# Patient Record
Sex: Female | Born: 1960 | Race: White | Hispanic: No | Smoking: Former smoker
Health system: Southern US, Community
[De-identification: ages and names within clinical notes are randomized; demographics above are authoritative.]

## PROBLEM LIST (undated history)

## (undated) DIAGNOSIS — F419 Anxiety disorder, unspecified: Secondary | ICD-10-CM

## (undated) DIAGNOSIS — M797 Fibromyalgia: Secondary | ICD-10-CM

## (undated) DIAGNOSIS — I10 Essential (primary) hypertension: Secondary | ICD-10-CM

## (undated) DIAGNOSIS — R519 Headache, unspecified: Secondary | ICD-10-CM

## (undated) DIAGNOSIS — C801 Malignant (primary) neoplasm, unspecified: Secondary | ICD-10-CM

## (undated) DIAGNOSIS — F32A Depression, unspecified: Secondary | ICD-10-CM

## (undated) DIAGNOSIS — E079 Disorder of thyroid, unspecified: Secondary | ICD-10-CM

## (undated) DIAGNOSIS — G4730 Sleep apnea, unspecified: Secondary | ICD-10-CM

## (undated) DIAGNOSIS — E039 Hypothyroidism, unspecified: Secondary | ICD-10-CM

## (undated) DIAGNOSIS — K219 Gastro-esophageal reflux disease without esophagitis: Secondary | ICD-10-CM

## (undated) DIAGNOSIS — Z87442 Personal history of urinary calculi: Secondary | ICD-10-CM

## (undated) DIAGNOSIS — F329 Major depressive disorder, single episode, unspecified: Secondary | ICD-10-CM

## (undated) DIAGNOSIS — M1990 Unspecified osteoarthritis, unspecified site: Secondary | ICD-10-CM

## (undated) DIAGNOSIS — R7303 Prediabetes: Secondary | ICD-10-CM

## (undated) HISTORY — PX: UMBILICAL HERNIA REPAIR: SHX196

## (undated) HISTORY — PX: MASTECTOMY: SHX3

## (undated) HISTORY — PX: IMAGE GUIDED SINUS SURGERY: SHX6570

## (undated) HISTORY — DX: Depression, unspecified: F32.A

## (undated) HISTORY — PX: FOOT BONE EXCISION: SUR493

## (undated) HISTORY — DX: Major depressive disorder, single episode, unspecified: F32.9

## (undated) HISTORY — PX: BREAST BIOPSY: SHX20

## (undated) HISTORY — PX: EYE SURGERY: SHX253

## (undated) HISTORY — PX: APPENDECTOMY: SHX54

---

## 2014-01-04 DIAGNOSIS — M069 Rheumatoid arthritis, unspecified: Secondary | ICD-10-CM | POA: Insufficient documentation

## 2014-01-04 DIAGNOSIS — F411 Generalized anxiety disorder: Secondary | ICD-10-CM | POA: Insufficient documentation

## 2014-01-04 DIAGNOSIS — E039 Hypothyroidism, unspecified: Secondary | ICD-10-CM | POA: Insufficient documentation

## 2014-01-04 DIAGNOSIS — E559 Vitamin D deficiency, unspecified: Secondary | ICD-10-CM | POA: Insufficient documentation

## 2014-02-04 DIAGNOSIS — I10 Essential (primary) hypertension: Secondary | ICD-10-CM | POA: Insufficient documentation

## 2014-04-16 DIAGNOSIS — E785 Hyperlipidemia, unspecified: Secondary | ICD-10-CM | POA: Insufficient documentation

## 2014-06-07 ENCOUNTER — Encounter: Payer: Self-pay | Admitting: Emergency Medicine

## 2014-06-07 ENCOUNTER — Emergency Department
Admission: EM | Admit: 2014-06-07 | Discharge: 2014-06-07 | Disposition: A | Discharge status: Home / Self Care | Payer: Medicare HMO | Attending: Emergency Medicine | Admitting: Emergency Medicine

## 2014-06-07 DIAGNOSIS — Z008 Encounter for other general examination: Secondary | ICD-10-CM | POA: Diagnosis present

## 2014-06-07 DIAGNOSIS — Z0000 Encounter for general adult medical examination without abnormal findings: Secondary | ICD-10-CM | POA: Diagnosis not present

## 2014-06-07 DIAGNOSIS — Z720 Tobacco use: Secondary | ICD-10-CM | POA: Insufficient documentation

## 2014-06-07 DIAGNOSIS — I10 Essential (primary) hypertension: Secondary | ICD-10-CM | POA: Diagnosis not present

## 2014-06-07 HISTORY — DX: Unspecified osteoarthritis, unspecified site: M19.90

## 2014-06-07 HISTORY — DX: Essential (primary) hypertension: I10

## 2014-06-07 HISTORY — DX: Fibromyalgia: M79.7

## 2014-06-07 HISTORY — DX: Disorder of thyroid, unspecified: E07.9
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
## 2014-06-07 NOTE — Discharge Instructions (Signed)
YOU WILL NEED TO SEE YOUR DOCTOR FOR CLEARANCE

## 2014-06-07 NOTE — ED Notes (Signed)
Pt states she is trying to give plasma and mentioned to them that she had Raynauds disease and they advised her she could not give plasma until she was seen by a Dr., states she unable to see her PCP today and she can not afford the co-payment at the urgent care, states she needs to be seen today because she depends on the plasma donation to pay her bills, denies any symptoms today

## 2014-10-25 ENCOUNTER — Other Ambulatory Visit: Payer: Self-pay | Admitting: Family Medicine

## 2014-10-25 DIAGNOSIS — Z1231 Encounter for screening mammogram for malignant neoplasm of breast: Secondary | ICD-10-CM

## 2015-11-04 DIAGNOSIS — F17200 Nicotine dependence, unspecified, uncomplicated: Secondary | ICD-10-CM | POA: Insufficient documentation

## 2015-11-04 DIAGNOSIS — M2550 Pain in unspecified joint: Secondary | ICD-10-CM | POA: Insufficient documentation

## 2016-03-15 ENCOUNTER — Encounter: Payer: Self-pay | Admitting: Emergency Medicine

## 2016-03-15 DIAGNOSIS — R1031 Right lower quadrant pain: Secondary | ICD-10-CM | POA: Diagnosis present

## 2016-03-15 DIAGNOSIS — N200 Calculus of kidney: Secondary | ICD-10-CM | POA: Insufficient documentation

## 2016-03-15 DIAGNOSIS — F17200 Nicotine dependence, unspecified, uncomplicated: Secondary | ICD-10-CM | POA: Insufficient documentation

## 2016-03-15 DIAGNOSIS — I10 Essential (primary) hypertension: Secondary | ICD-10-CM | POA: Insufficient documentation

## 2016-03-15 LAB — URINALYSIS, COMPLETE (UACMP) WITH MICROSCOPIC
Bacteria, UA: NONE SEEN
Bilirubin Urine: NEGATIVE
Glucose, UA: NEGATIVE mg/dL
Ketones, ur: NEGATIVE mg/dL
Leukocytes, UA: NEGATIVE
Nitrite: NEGATIVE
Protein, ur: 30 mg/dL — AB
Specific Gravity, Urine: 1.004 — ABNORMAL LOW (ref 1.005–1.030)
pH: 7 (ref 5.0–8.0)

## 2016-03-15 LAB — COMPREHENSIVE METABOLIC PANEL
ALT: 23 U/L (ref 14–54)
AST: 26 U/L (ref 15–41)
Albumin: 4 g/dL (ref 3.5–5.0)
Alkaline Phosphatase: 72 U/L (ref 38–126)
Anion gap: 6 (ref 5–15)
BUN: 13 mg/dL (ref 6–20)
CALCIUM: 9.5 mg/dL (ref 8.9–10.3)
CO2: 29 mmol/L (ref 22–32)
Chloride: 105 mmol/L (ref 101–111)
Creatinine, Ser: 0.8 mg/dL (ref 0.44–1.00)
GFR calc non Af Amer: >60 mL/min (ref >60)
Glucose, Bld: 119 mg/dL — ABNORMAL HIGH (ref 65–99)
Potassium: 4.4 mmol/L (ref 3.5–5.1)
Sodium: 140 mmol/L (ref 135–145)
Total Bilirubin: 0.2 mg/dL — ABNORMAL LOW (ref 0.3–1.2)
Total Protein: 7.6 g/dL (ref 6.5–8.1)

## 2016-03-15 LAB — CBC
HCT: 41.9 % (ref 35.0–47.0)
Hemoglobin: 14.1 g/dL (ref 12.0–16.0)
MCH: 29.4 pg (ref 26.0–34.0)
MCHC: 33.6 g/dL (ref 32.0–36.0)
MCV: 87.4 fL (ref 80.0–100.0)
Platelets: 312 10*3/uL (ref 150–440)
RBC: 4.8 MIL/uL (ref 3.80–5.20)
RDW: 13 % (ref 11.5–14.5)
WBC: 11.3 10*3/uL — ABNORMAL HIGH (ref 3.6–11.0)

## 2016-03-15 LAB — LIPASE, BLOOD: Lipase: 39 U/L (ref 11–51)

## 2016-03-15 NOTE — ED Triage Notes (Signed)
Pt has hx of constipation but had large bm Tuesday, states now having generalized abd pain and right sided back pain.  Pt has nausea but no vomiting, has had chills, states does have some dysuria.

## 2016-03-16 ENCOUNTER — Emergency Department
Admission: EM | Admit: 2016-03-16 | Discharge: 2016-03-16 | Disposition: A | Discharge status: Home / Self Care | Payer: Medicare HMO | Attending: Emergency Medicine | Admitting: Emergency Medicine

## 2016-03-16 ENCOUNTER — Emergency Department: Payer: Medicare HMO

## 2016-03-16 DIAGNOSIS — N200 Calculus of kidney: Secondary | ICD-10-CM

## 2016-03-16 MED ORDER — CEPHALEXIN 500 MG PO CAPS: 500.0000 mg | ORAL_CAPSULE | Freq: Two times a day (BID) | ORAL | 0 refills | Status: AC

## 2016-03-16 MED ORDER — SODIUM CHLORIDE 0.9 % IV BOLUS (SEPSIS)
1000.0000 mL | Freq: Once | INTRAVENOUS | Status: AC
  Administered 2016-03-16: 1000 mL via INTRAVENOUS

## 2016-03-16 MED ORDER — KETOROLAC TROMETHAMINE 30 MG/ML IJ SOLN
30.0000 mg | Freq: Once | INTRAMUSCULAR | Status: AC
  Administered 2016-03-16: 30 mg via INTRAVENOUS
  Filled 2016-03-16: qty 1

## 2016-03-16 MED ORDER — METOPROLOL TARTRATE 50 MG PO TABS: 50.0000 mg | ORAL_TABLET | Freq: Two times a day (BID) | ORAL | 0 refills | Status: AC

## 2016-03-16 MED ORDER — LISINOPRIL 20 MG PO TABS: 20.0000 mg | ORAL_TABLET | Freq: Every day | ORAL | 0 refills | Status: DC

## 2016-03-16 MED ORDER — ONDANSETRON HCL 4 MG/2ML IJ SOLN
4.0000 mg | Freq: Once | INTRAMUSCULAR | Status: AC
  Administered 2016-03-16: 4 mg via INTRAVENOUS
  Filled 2016-03-16: qty 2

## 2016-03-16 NOTE — ED Provider Notes (Signed)
[facility] Emergency Department Provider Note    First MD Initiated Contact with Patient 03/16/16 0143     (approximate)  I have reviewed the triage vital signs and the nursing notes.   HISTORY  Chief Complaint Constipation   HPI Kelli Logan is a 56 y.o. female with below list of chronic medical conditions including kidney stones presents to the emergency department with acute onset of right flank pain radiating to the right lower quadrant accompanied by nausea onset today. Patient states her current pain score 7 out of 10. Patient denies any diarrhea no fever or vomiting. Patient is also requesting a refill of her antihypertensive medications. Patient denies any hematuria no dysuria or frequency or urgency.   Past Medical History:  Diagnosis Date  . Arthritis   . Fibromyalgia   . Hypertension   . Thyroid disease     There are no active problems to display for this patient.   Past surgical history None  Prior to Admission medications   Not on File    Allergies No known drug allergies  No family history on file.  Social History Social History  Substance Use Topics  . Smoking status: Current Some Day Smoker  . Smokeless tobacco: Not on file  . Alcohol use Yes    Review of Systems Constitutional: No fever/chills Eyes: No visual changes. ENT: No sore throat. Cardiovascular: Denies chest pain. Respiratory: Denies shortness of breath. Gastrointestinal: Positive for right flank/right lower quadrant pain.  No nausea, no vomiting.  No diarrhea.  No constipation. Genitourinary: Negative for dysuria. Musculoskeletal: Negative for back pain. Skin: Negative for rash. Neurological: Negative for headaches, focal weakness or numbness.  10-point ROS otherwise negative.  ____________________________________________   PHYSICAL EXAM:  VITAL SIGNS: ED Triage Vitals [03/15/16 2243]  Enc Vitals Group     BP (!) 176/100     Pulse Rate 83      Resp 18     Temp 97.7 F (36.5 C)     Temp Source Oral     SpO2 99 %     Weight 170 lb (77.1 kg)     Height 5\' 6"  (1.676 m)     Head Circumference      Peak Flow      Pain Score 8     Pain Loc      Pain Edu?      Excl. in [place]?     Constitutional: Alert and oriented. Well appearing and in no acute distress. Eyes: Conjunctivae are normal. PERRL. EOMI. Head: Atraumatic. Mouth/Throat: Mucous membranes are moist.  Oropharynx non-erythematous. Neck: No stridor.   Cardiovascular: Normal rate, regular rhythm. Good peripheral circulation. Grossly normal heart sounds. Respiratory: Normal respiratory effort.  No retractions. Lungs CTAB. Gastrointestinal: Soft and nontender. No distention.  Musculoskeletal: No lower extremity tenderness nor edema. No gross deformities of extremities. Neurologic:  Normal speech and language. No gross focal neurologic deficits are appreciated.  Skin:  Skin is warm, dry and intact. No rash noted. Psychiatric: Mood and affect are normal. Speech and behavior are normal.  ____________________________________________   LABS (all labs ordered are listed, but only abnormal results are displayed)  Labs Reviewed  CBC - Abnormal; Notable for the following:       Result Value   WBC 11.3 (*)    All other components within normal limits  COMPREHENSIVE METABOLIC PANEL - Abnormal; Notable for the following:    Glucose, Bld 119 (*)    Total Bilirubin 0.2 (*)  All other components within normal limits  URINALYSIS, COMPLETE (UACMP) WITH MICROSCOPIC - Abnormal; Notable for the following:    Color, Urine STRAW (*)    APPearance CLEAR (*)    Specific Gravity, Urine 1.004 (*)    Hgb urine dipstick MODERATE (*)    Protein, ur 30 (*)    Squamous Epithelial / LPF 0-5 (*)    All other components within normal limits  LIPASE, BLOOD   CLINICAL DATA:  Initial evaluation for acute right flank pain.  EXAM: CT ABDOMEN AND PELVIS WITHOUT  CONTRAST  TECHNIQUE: Multidetector CT imaging of the abdomen and pelvis was performed following the standard protocol without IV contrast.  COMPARISON:  None available.  FINDINGS: Lower chest: Mild bibasilar atelectatic changes noted. Visualized lungs are otherwise clear.  Hepatobiliary: Subcentimeter 6 mm hypodensity within the left hepatic lobe noted, indeterminate, but may reflect a small cyst. Additional 13 mm vague hypodensity within the posterior right hepatic lobe noted (series 2, image 14), indeterminate. Liver otherwise unremarkable. Gallbladder appears to be contracted. No biliary dilatation.  Pancreas: Pancreas within normal limits.  Spleen: Spleen within normal limits.  Adrenals/Urinary Tract: Adrenal glands are normal.  3.7 cm cyst present within the interpolar left kidney. Punctate 2 mm nonobstructive stone within the lower pole left kidney. No radiopaque calculi seen along the course of the left renal collect system. No left-sided hydronephrosis or hydroureter.  On the right, there is mild right hydroureteronephrosis. Prominent inflammatory stranding about the mildly dilated right ureter and right renal collecting system. No definite obstructive radiopaque stone identified. Finding raises the possibility for a recently passed stone. Possible infection could also be considered. Few scattered subcentimeter hypodensities noted within the interpolar right kidney, poorly characterized on this noncontrast exam, but statistically likely small cysts.  Bladder partially distended. No layering stones present within the bladder lumen.  Stomach/Bowel: Small hiatal hernia. Stomach otherwise unremarkable. No evidence for bowel obstruction. No acute inflammatory changes seen about the bowels.  Vascular/Lymphatic: Intra- abdominal aorta of normal caliber. Minimal atheromatous plaque within the infrarenal aorta. No pathologically enlarged intra-abdominal or  pelvic lymph nodes. Minimal hazy stranding about the infrarenal aorta just above the bifurcation related to the adjacent inflamed right ureter.  Reproductive: Uterus and ovaries within normal limits.  Other: No free air or fluid.  Musculoskeletal: No acute osseous abnormality. No worrisome lytic or blastic osseous lesions. Scatter multilevel facet arthropathy noted within the visualized spine.  IMPRESSION: 1. Mild right hydroureteronephrosis with associated periureteral fat stranding. No obstructive radiopaque calculus identified. Finding raises the possibility for a recently passed stone. Possible infection could also conceivably be considered. Correlation with urinalysis recommended. 2. Punctate 2 mm nonobstructive left renal nephrolithiasis. 3. Small hiatal hernia.   Electronically Signed   By: Jeannine Boga M.D.   On: 03/16/2016 02:42   Procedures    INITIAL IMPRESSION / ASSESSMENT AND PLAN / ED COURSE  Pertinent labs & imaging results that were available during my care of the patient were reviewed by me and considered in my medical decision making (see chart for details).  Patient given Toradol 30 mg IV and Zofran 4 mg as well as 1 L IV normal saline.      ____________________________________________  FINAL CLINICAL IMPRESSION(S) / ED DIAGNOSES  Final diagnoses:  Right kidney stone     MEDICATIONS GIVEN DURING THIS VISIT:  Medications - No data to display   NEW OUTPATIENT MEDICATIONS STARTED DURING THIS VISIT:  New Prescriptions   No medications on file  Modified Medications   No medications on file    Discontinued Medications   No medications on file     Note:  This document was prepared using Dragon voice recognition software and may include unintentional dictation errors.    Gregor Hams, MD 03/16/16 [id number]

## 2017-07-12 DIAGNOSIS — K219 Gastro-esophageal reflux disease without esophagitis: Secondary | ICD-10-CM | POA: Insufficient documentation

## 2017-09-03 ENCOUNTER — Other Ambulatory Visit: Payer: Self-pay | Admitting: Registered Nurse

## 2017-09-03 DIAGNOSIS — Z1231 Encounter for screening mammogram for malignant neoplasm of breast: Secondary | ICD-10-CM

## 2017-11-18 ENCOUNTER — Ambulatory Visit
Admission: RE | Admit: 2017-11-18 | Discharge: 2017-11-18 | Disposition: A | Discharge status: Home / Self Care | Payer: Medicare HMO | Source: Ambulatory Visit | Attending: Registered Nurse | Admitting: Registered Nurse

## 2017-11-18 DIAGNOSIS — Z1231 Encounter for screening mammogram for malignant neoplasm of breast: Secondary | ICD-10-CM | POA: Diagnosis present

## 2017-12-04 DIAGNOSIS — R928 Other abnormal and inconclusive findings on diagnostic imaging of breast: Secondary | ICD-10-CM | POA: Insufficient documentation

## 2017-12-09 ENCOUNTER — Other Ambulatory Visit: Payer: Self-pay | Admitting: Registered Nurse

## 2017-12-09 DIAGNOSIS — R928 Other abnormal and inconclusive findings on diagnostic imaging of breast: Secondary | ICD-10-CM

## 2017-12-09 DIAGNOSIS — R921 Mammographic calcification found on diagnostic imaging of breast: Secondary | ICD-10-CM

## 2017-12-10 ENCOUNTER — Ambulatory Visit: Payer: Medicare HMO

## 2017-12-13 ENCOUNTER — Ambulatory Visit
Admission: RE | Admit: 2017-12-13 | Discharge: 2017-12-13 | Disposition: A | Discharge status: Home / Self Care | Payer: Medicare HMO | Source: Ambulatory Visit | Attending: Registered Nurse | Admitting: Registered Nurse

## 2017-12-13 DIAGNOSIS — R928 Other abnormal and inconclusive findings on diagnostic imaging of breast: Secondary | ICD-10-CM | POA: Diagnosis not present

## 2017-12-13 DIAGNOSIS — R921 Mammographic calcification found on diagnostic imaging of breast: Secondary | ICD-10-CM | POA: Diagnosis present

## 2017-12-18 ENCOUNTER — Emergency Department
Admission: EM | Admit: 2017-12-18 | Discharge: 2017-12-18 | Disposition: A | Discharge status: Home / Self Care | Payer: Medicare HMO | Attending: Emergency Medicine | Admitting: Emergency Medicine

## 2017-12-18 ENCOUNTER — Other Ambulatory Visit: Payer: Self-pay | Admitting: Registered Nurse

## 2017-12-18 ENCOUNTER — Other Ambulatory Visit: Payer: Self-pay

## 2017-12-18 ENCOUNTER — Emergency Department: Payer: Medicare HMO

## 2017-12-18 DIAGNOSIS — F17200 Nicotine dependence, unspecified, uncomplicated: Secondary | ICD-10-CM | POA: Insufficient documentation

## 2017-12-18 DIAGNOSIS — Y929 Unspecified place or not applicable: Secondary | ICD-10-CM | POA: Diagnosis not present

## 2017-12-18 DIAGNOSIS — R921 Mammographic calcification found on diagnostic imaging of breast: Secondary | ICD-10-CM

## 2017-12-18 DIAGNOSIS — Y999 Unspecified external cause status: Secondary | ICD-10-CM | POA: Insufficient documentation

## 2017-12-18 DIAGNOSIS — Z79899 Other long term (current) drug therapy: Secondary | ICD-10-CM | POA: Diagnosis not present

## 2017-12-18 DIAGNOSIS — Y939 Activity, unspecified: Secondary | ICD-10-CM | POA: Diagnosis not present

## 2017-12-18 DIAGNOSIS — M545 Low back pain: Secondary | ICD-10-CM | POA: Diagnosis not present

## 2017-12-18 DIAGNOSIS — R928 Other abnormal and inconclusive findings on diagnostic imaging of breast: Secondary | ICD-10-CM

## 2017-12-18 DIAGNOSIS — I10 Essential (primary) hypertension: Secondary | ICD-10-CM | POA: Insufficient documentation

## 2017-12-18 DIAGNOSIS — S8012XA Contusion of left lower leg, initial encounter: Secondary | ICD-10-CM | POA: Insufficient documentation

## 2017-12-18 DIAGNOSIS — S8992XA Unspecified injury of left lower leg, initial encounter: Secondary | ICD-10-CM | POA: Diagnosis present

## 2017-12-18 HISTORY — DX: Anxiety disorder, unspecified: F41.9

## 2017-12-18 LAB — CBC WITH DIFFERENTIAL/PLATELET
Abs Immature Granulocytes: 0.01 10*3/uL (ref 0.00–0.07)
BASOS PCT: 1 %
Basophils Absolute: 0.1 10*3/uL (ref 0.0–0.1)
EOS ABS: 0.3 10*3/uL (ref 0.0–0.5)
Eosinophils Relative: 5 %
HEMATOCRIT: 39.1 % (ref 36.0–46.0)
Hemoglobin: 13.2 g/dL (ref 12.0–15.0)
IMMATURE GRANULOCYTES: 0 %
Lymphocytes Relative: 32 %
Lymphs Abs: 1.8 10*3/uL (ref 0.7–4.0)
MCH: 28.6 pg (ref 26.0–34.0)
MCHC: 33.8 g/dL (ref 30.0–36.0)
MCV: 84.6 fL (ref 80.0–100.0)
MONO ABS: 0.6 10*3/uL (ref 0.1–1.0)
Monocytes Relative: 11 %
NRBC: 0 % (ref 0.0–0.2)
Neutro Abs: 2.9 10*3/uL (ref 1.7–7.7)
Neutrophils Relative %: 51 %
PLATELETS: 330 10*3/uL (ref 150–400)
RBC: 4.62 MIL/uL (ref 3.87–5.11)
RDW: 12.5 % (ref 11.5–15.5)
WBC: 5.7 10*3/uL (ref 4.0–10.5)

## 2017-12-18 LAB — COMPREHENSIVE METABOLIC PANEL
ALBUMIN: 4 g/dL (ref 3.5–5.0)
ALT: 30 U/L (ref 0–44)
AST: 30 U/L (ref 15–41)
Alkaline Phosphatase: 73 U/L (ref 38–126)
Anion gap: 8 (ref 5–15)
BUN: 12 mg/dL (ref 6–20)
CHLORIDE: 102 mmol/L (ref 98–111)
CO2: 24 mmol/L (ref 22–32)
Calcium: 9.1 mg/dL (ref 8.9–10.3)
Creatinine, Ser: 0.79 mg/dL (ref 0.44–1.00)
GFR calc Af Amer: >60 mL/min (ref >60)
GFR calc non Af Amer: >60 mL/min (ref >60)
Glucose, Bld: 97 mg/dL (ref 70–99)
POTASSIUM: 4.1 mmol/L (ref 3.5–5.1)
SODIUM: 134 mmol/L — AB (ref 135–145)
Total Bilirubin: 0.9 mg/dL (ref 0.3–1.2)
Total Protein: 7 g/dL (ref 6.5–8.1)

## 2017-12-18 MED ORDER — HYDROCODONE-ACETAMINOPHEN 5-325 MG PO TABS: 1.0000 | ORAL_TABLET | Freq: Four times a day (QID) | ORAL | 0 refills | Status: DC | PRN

## 2017-12-18 MED ORDER — IBUPROFEN 800 MG PO TABS: 800.0000 mg | ORAL_TABLET | Freq: Three times a day (TID) | ORAL | 0 refills | Status: DC | PRN

## 2017-12-18 MED ORDER — MORPHINE SULFATE (PF) 4 MG/ML IV SOLN
4.0000 mg | Freq: Once | INTRAVENOUS | Status: AC
  Administered 2017-12-18: 4 mg via INTRAVENOUS
  Filled 2017-12-18: qty 1

## 2017-12-18 NOTE — ED Notes (Signed)
Ace wrap placed on pt right lower leg. Pt supplied with crutches and given instructions for use. Pt able to ambulate around the room without difficulties
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
## 2017-12-18 NOTE — ED Notes (Signed)
Pt taken to xray 
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
## 2017-12-26 ENCOUNTER — Ambulatory Visit
Admission: RE | Admit: 2017-12-26 | Discharge: 2017-12-26 | Disposition: A | Discharge status: Home / Self Care | Payer: Medicare HMO | Source: Ambulatory Visit | Attending: Registered Nurse | Admitting: Registered Nurse

## 2017-12-26 DIAGNOSIS — R928 Other abnormal and inconclusive findings on diagnostic imaging of breast: Secondary | ICD-10-CM | POA: Diagnosis present

## 2017-12-26 DIAGNOSIS — R921 Mammographic calcification found on diagnostic imaging of breast: Secondary | ICD-10-CM | POA: Insufficient documentation

## 2017-12-26 HISTORY — PX: BREAST BIOPSY: SHX20

## 2017-12-27 ENCOUNTER — Other Ambulatory Visit: Payer: Self-pay | Admitting: Anatomic Pathology & Clinical Pathology

## 2017-12-30 ENCOUNTER — Other Ambulatory Visit: Payer: Self-pay | Admitting: *Deleted

## 2017-12-30 DIAGNOSIS — C50912 Malignant neoplasm of unspecified site of left female breast: Secondary | ICD-10-CM

## 2018-01-02 LAB — SURGICAL PATHOLOGY

## 2018-01-06 ENCOUNTER — Ambulatory Visit (INDEPENDENT_AMBULATORY_CARE_PROVIDER_SITE_OTHER): Payer: Medicare HMO | Admitting: General Surgery

## 2018-01-06 ENCOUNTER — Encounter: Payer: Self-pay | Admitting: Internal Medicine

## 2018-01-06 ENCOUNTER — Encounter: Payer: Self-pay | Admitting: General Surgery

## 2018-01-06 ENCOUNTER — Inpatient Hospital Stay: Payer: Medicare HMO | Attending: Internal Medicine | Admitting: Internal Medicine

## 2018-01-06 ENCOUNTER — Other Ambulatory Visit: Payer: Self-pay

## 2018-01-06 ENCOUNTER — Encounter: Payer: Self-pay | Admitting: *Deleted

## 2018-01-06 VITALS — BP 154/103 | HR 87 | Temp 97.7°F | Resp 18 | Ht 66.0 in | Wt 190.2 lb

## 2018-01-06 DIAGNOSIS — Z87891 Personal history of nicotine dependence: Secondary | ICD-10-CM | POA: Insufficient documentation

## 2018-01-06 DIAGNOSIS — R921 Mammographic calcification found on diagnostic imaging of breast: Secondary | ICD-10-CM

## 2018-01-06 DIAGNOSIS — Z79899 Other long term (current) drug therapy: Secondary | ICD-10-CM

## 2018-01-06 DIAGNOSIS — Z170 Estrogen receptor positive status [ER+]: Secondary | ICD-10-CM | POA: Diagnosis not present

## 2018-01-06 DIAGNOSIS — C50512 Malignant neoplasm of lower-outer quadrant of left female breast: Secondary | ICD-10-CM

## 2018-01-06 DIAGNOSIS — I10 Essential (primary) hypertension: Secondary | ICD-10-CM | POA: Diagnosis not present
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
## 2018-01-06 NOTE — Progress Notes (Signed)
Patient needs an additional biopsy of additional left breast calcifications on mammo after biopsy.   Also, needs a left axillary ultrasound per Dr. Celine Ahr.   Orders in [place].   Note to Sam in mammography at [facility].

## 2018-01-06 NOTE — Progress Notes (Signed)
Patient ID: Kelli Logan, female   DOB: 1960/01/11, 57 y.o.   MRN: [id number]  Chief Complaint  Patient presents with  . New Patient (Initial Visit)    Left breast cancer    HPI Kelli Logan is a 57 y.o. female.   She has been referred for surgical evaluation of her left breast cancer.  Routine screening mammogram detected suspicious calcifications and subsequent diagnostic mammography confirmed this finding in the left breast.  She underwent stereotactic core biopsy with the final pathology showing invasive ductal carcinoma, high-grade, comedo necrosis present, ER positive, PR negative, HER-2 negative.  He has just come from her appointment with medical oncology, however their note is not yet complete.  Notably, the post biopsy mammogram demonstrated an additional area of calcifications, for which additional biopsy was recommended, if breast conservation is desired.  Her menarche was at 54.  She had one pregnancy but did not breast-feed the child.  She underwent menopause at the age of 67.  She had exposure to oral contraceptive pills for 20+ years.  She did not have any exposure to hormone replacement therapy.  She has no family history of breast cancer to her knowledge.  She denies performing monthly self exams.  Her last mammogram, prior to the most recent, was performed in 2014 in [place].  There was a biopsy performed which was consistent with a benign process.  She is understandably tearful today.  She has a number of other issues that are concerning to her.  These include a recent accident with injury to her left lower extremity.  She also had a dental crown or filling knocked loose and she has dental pain as a result of this.  She expressed a strong desire for breast conservation if possible.   Past Medical History:  Diagnosis Date  . Anxiety   . Arthritis   . Fibromyalgia   . Hypertension   . Thyroid disease     Past Surgical History:  Procedure Laterality Date  .  APPENDECTOMY    . BREAST BIOPSY Left few yrs ago   stereotactic bx in [place], benign  . BREAST BIOPSY Left 12/26/2017   Affirm Bx-Path pending- X-Clip  . FOOT BONE EXCISION    . UMBILICAL HERNIA REPAIR  1983 and 1985    Family History  Problem Relation Age of Onset  . Hypertension Mother   . Lung cancer Maternal Grandmother   . Heart disease Father     Social History Social History   Tobacco Use  . Smoking status: Former Smoker    Last attempt to quit: 01/06/2017    Years since quitting: 1.0  . Smokeless tobacco: Never Used  Substance Use Topics  . Alcohol use: Yes  . Drug use: Not on file    No Known Allergies  Current Outpatient Medications  Medication Sig Dispense Refill  . hydrochlorothiazide (HYDRODIURIL) 25 MG tablet     . HYDROcodone-acetaminophen (NORCO/VICODIN) 5-325 MG tablet Take 1 tablet by mouth every 6 (six) hours as needed for moderate pain. 10 tablet 0  . ibuprofen (ADVIL,MOTRIN) 800 MG tablet Take 1 tablet (800 mg total) by mouth every 8 (eight) hours as needed. 30 tablet 0  . levothyroxine (SYNTHROID, LEVOTHROID) 50 MCG tablet     . lisinopril (PRINIVIL,ZESTRIL) 20 MG tablet Take 20 mg by mouth daily.    . metoprolol (LOPRESSOR) 50 MG tablet Take 1 tablet (50 mg total) by mouth 2 (two) times daily. 60 tablet 0  . omeprazole (PRILOSEC) 20 MG  capsule     . lisinopril (PRINIVIL,ZESTRIL) 20 MG tablet Take 1 tablet (20 mg total) by mouth daily. 30 tablet 0   No current facility-administered medications for this visit.     Review of Systems Review of Systems  Constitutional: Positive for fatigue.  HENT: Positive for congestion, dental problem, sinus pressure and sinus pain.   Eyes: Negative.   Respiratory: Negative.   Cardiovascular: Positive for palpitations and leg swelling.  Gastrointestinal:       GERD  Endocrine:       Hypothyroid   Genitourinary:       Nephrolithiasis  Musculoskeletal: Positive for back pain.  Skin:       Bruising on LLE from  trauma  Neurological: Negative.   Hematological: Negative.   Psychiatric/Behavioral: The patient is nervous/anxious.   All other systems reviewed and are negative.   Blood pressure (!) 154/103, pulse 87, temperature 97.7 F (36.5 C), temperature source Temporal, resp. rate 18, height _0  (1.676 m), weight 190 lb 3.2 oz (86.3 kg), SpO2 97 %.  Physical Exam Physical Exam Vitals signs reviewed. Exam conducted with a chaperone present.  Constitutional:      General: She is not in acute distress.    Appearance: She is obese.     Comments: Tearful, anxious  HENT:     Head: Normocephalic and atraumatic.     Nose: Congestion present.     Mouth/Throat:     Mouth: Mucous membranes are moist.     Pharynx: No oropharyngeal exudate.     Comments: Molar with broken off crown or filling (right maxillary) Eyes:     General: No scleral icterus.    Conjunctiva/sclera: Conjunctivae normal.     Pupils: Pupils are equal, round, and reactive to light.  Neck:     Musculoskeletal: Normal range of motion and neck supple.  Cardiovascular:     Rate and Rhythm: Normal rate and regular rhythm.     Pulses: Normal pulses.  Pulmonary:     Effort: Pulmonary effort is normal.     Breath sounds: Normal breath sounds.  Chest:     Breasts: Breasts are symmetrical.        Right: Tenderness present. No swelling, bleeding, inverted nipple, mass, nipple discharge or skin change.        Left: No swelling, bleeding, inverted nipple, mass, nipple discharge, skin change or tenderness.       Comments: Right breast tender in periareolar area.  Dense breast tissue.  No masses palpable in either breast. Biopsy site on left breast without hematoma or other skin changes.   Abdominal:     General: Abdomen is flat. Bowel sounds are normal.     Palpations: Abdomen is soft. There is no mass.  Musculoskeletal:     Comments: LLE with bruising anterior tibia.  Tender.  Lymphadenopathy:     Cervical: No cervical adenopathy.      Upper Body:     Right upper body: No supraclavicular, axillary or pectoral adenopathy.     Left upper body: No supraclavicular, axillary or pectoral adenopathy.  Skin:    General: Skin is warm and dry.  Neurological:     General: No focal deficit present.     Mental Status: She is alert.  Psychiatric:        Mood and Affect: Mood normal.     Data Reviewed Pathology: Surgical Pathology  * THIS IS AN ADDENDUM REPORT *  CASE: [id number]  PATIENT: [facility]  Surgical  Pathology Report  **Addendum **  Reason for Addendum #1: Breast Biomarker Results   SPECIMEN SUBMITTED:  A. Breast, left   CLINICAL HISTORY:  Screening detected left breast calcifications   PRE-OPERATIVE DIAGNOSIS:  FCC, fibroadenoma r/o malignancy   POST-OPERATIVE DIAGNOSIS:  None provided.      DIAGNOSIS:  A. BREAST, LEFT LOWER OUTER QUADRANT; STEREOTACTIC BIOPSY:  - INVASIVE MAMMARY CARCINOMA, NO SPECIAL TYPE.  - HIGH GRADE DUCTAL CARCINOMA IN SITU, WITH COMEDONECROSIS, PRESENT IN 5  OF 5 TISSUE BLOCKS.  - CALCIFICATIONS PRESENT, ASSOCIATED WITH NEOPLASTIC ELEMENTS.   Size of invasive carcinoma: 2 mm in this sample  Histologic grade of invasive carcinoma: Grade 3            Glandular/tubular differentiation score: 3            Nuclear pleomorphism score: 3            Mitotic rate score: 2            Total score: 8  Ductal carcinoma in situ: Present, high-grade with comedonecrosis  Lymphovascular invasion: Not identified   Surgical Pathology  * THIS IS AN ADDENDUM REPORT *  CASE: [id number]  PATIENT: [place]  Surgical Pathology Report  *Addendum *   Reason for Addendum #1: Breast Biomarker Results   SPECIMEN SUBMITTED:  A. Breast, left   CLINICAL HISTORY:  Screening detected left breast calcifications   PRE-OPERATIVE DIAGNOSIS:  FCC, fibroadenoma r/o malignancy   POST-OPERATIVE DIAGNOSIS:  None provided.       DIAGNOSIS:  A. BREAST, LEFT LOWER OUTER QUADRANT; STEREOTACTIC BIOPSY:  - INVASIVE MAMMARY CARCINOMA, NO SPECIAL TYPE.  - HIGH GRADE DUCTAL CARCINOMA IN SITU, WITH COMEDONECROSIS, PRESENT IN 5  OF 5 TISSUE BLOCKS.  - CALCIFICATIONS PRESENT, ASSOCIATED WITH NEOPLASTIC ELEMENTS.   Size of invasive carcinoma: 2 mm in this sample  Histologic grade of invasive carcinoma: Grade 3            Glandular/tubular differentiation score: 3            Nuclear pleomorphism score: 3            Mitotic rate score: 2            Total score: 8  Ductal carcinoma in situ: Present, high-grade with comedonecrosis  Lymphovascular invasion: Not identified   ER/PR/HER2: Immunohistochemistry will be performed on block A1, with  reflex to [place] for HER2 2+. The results will be reported in an addendum.   The findings were communicated to [facility] in the [place]  [facility] at 2:15 pm on 12/27/2017.    GROSS DESCRIPTION:  A. Labeled: Left breast LOQ  Received: in a formalin-filled [place] collection device  Accompanying specimen radiograph: Yes  Time/Date in fixative: 1:25 PM on 12/26/2017  Cold ischemic time: 3 minutes  Total fixation time: 7 hours  Core pieces: Multiple  Measurement: Aggregate, 6.2 x 1.8 x 0.2 cm  Description / comments: Yellow lobulated fibrofatty  Inked: Green  Entirely submitted in cassette(s):  1- section B  2- section C  3- section D  4- section E  5-remaining tissue    Final Diagnosis performed by Allena Napoleon, MD.  Electronically signed  12/27/2017 2:22:35PM  The electronic signature indicates that the named Attending Pathologist  has evaluated the specimen  Technical component performed at [facility], [address] [place],  [place] [address] Lab: [phone] Dir: Rush Farmer, MD, MMM Professional  component performed at [facility], [address]  [place], [place], [place] [address]  Lab: [phone] Dir: Dellia Nims.  Rubinas, MD   ADDENDUM:  BREAST BIOMARKER TESTS  Estrogen Receptor (ER) Status: POSITIVE            Percentage of cells with nuclear positivity: >90%            Average intensity of staining: Strong   Progesterone Receptor (PgR) Status: NEGATIVE (LESS THAN 1%)            Internal controls cells present and stain as  expected   HER2 (by immunohistochemistry): Negative (score 1+)   Mammography reports reviewed. Assessment    This is a 57 year old woman with biopsy-proven left breast cancer.  She strongly desires breast conservation.  Discussed with her that this may not be possible, in light of the size of the region described on her mammogram.  Although the mass is not palpable the area is up to 5.6 cm and there are additional calcifications that need to be biopsied.  She does have larger breasts and she may be able to have reasonable cosmetic outcome with breast conservation surgery, if the second area of calcifications is not consistent with a malignant process.    Plan    We will arrange for her to undergo a second stereotactic core biopsy of the additional area of calcifications.  She also needs an axillary ultrasound to evaluate for any abnormal lymph nodes in her left axilla.  This will be scheduled as well.  I will look for the note from oncology to see what their recommendations have been.  She may benefit from neoadjuvant chemotherapy to reduce the size of the tumor area, however given that this is not a palpable mass that may not be feasible.  We discussed surgical options including wire localized lumpectomy with sentinel lymph node biopsy, mastectomy with immediate or delayed reconstruction, axillary lymph node dissection, and the attendant risks and benefits of each of these procedures.  For now, I will await the results of her second biopsy and axillary ultrasound as these results dictate our next course of action.     She is very concerned about her financial situation.  She was advised to discuss her concerns with her nurse navigator at the breast care center.  She was reassured that there are options available to her and that she would be assisted in applying for these programs.   Fredirick Maudlin 01/06/2018, 5:24 PM

## 2018-01-06 NOTE — Assessment & Plan Note (Addendum)
#  Stage I left breast IMC ER-Pos; PR-neg; Her 2 neu-neg- 70mm/with high-grade DCIS ~ 5 cm in size with calcifications with comedonecrosis.  # # I had a long discussion with the patient in general regarding the treatment options of breast cancer including-surgery; adjuvant radiation; role of adjuvant systemic therapy including-chemotherapy antihormone therapy.   Patient will need lumpectomy with sentinel lymph node evaluation; followed by radiation.  Chemotherapy less likely; however final recommendations based on molecular testing after surgery.  Patient will benefit from antihormone therapy.  Reviewed mastectomy-patient inclined towards lumpectomy with radiation.  # I discussed the potential benefits of each option; and also potential downsides in detail.  Patient is awaiting surgical evaluation later this afternoon.  Thank you  for allowing me to participate in the care of your pleasant patient. Please do not hesitate to contact me with questions or concerns in the interim.  # DISPOSITION: # follow up in 2 weeks or so after the surgery-Dr.B

## 2018-01-06 NOTE — Patient Instructions (Addendum)
We  will schedule the additional stereotatic left breast biopsy. We will also order Left axillary ultrasound.  Dr.Cannon will call you once the above has been done to discuss the results.

## 2018-01-07 ENCOUNTER — Other Ambulatory Visit: Payer: Self-pay | Admitting: General Surgery

## 2018-01-07 DIAGNOSIS — C50512 Malignant neoplasm of lower-outer quadrant of left female breast: Secondary | ICD-10-CM

## 2018-01-07 DIAGNOSIS — Z170 Estrogen receptor positive status [ER+]: Principal | ICD-10-CM

## 2018-01-07 DIAGNOSIS — R921 Mammographic calcification found on diagnostic imaging of breast: Secondary | ICD-10-CM

## 2018-01-09 ENCOUNTER — Encounter: Payer: Self-pay | Admitting: *Deleted

## 2018-01-09 DIAGNOSIS — R202 Paresthesia of skin: Secondary | ICD-10-CM | POA: Insufficient documentation

## 2018-01-09 NOTE — Progress Notes (Signed)
Patient has been scheduled for a left stereotactic biopsy and left axillary ultrasound for 01-14-18 at 12:40 pm at the [facility].   The patient has been notified of date, time, and instructions per the mammography department.

## 2018-01-10 ENCOUNTER — Other Ambulatory Visit: Payer: Self-pay | Admitting: *Deleted

## 2018-01-14 ENCOUNTER — Ambulatory Visit
Admission: RE | Admit: 2018-01-14 | Discharge: 2018-01-14 | Disposition: A | Discharge status: Home / Self Care | Payer: Medicare HMO | Source: Ambulatory Visit | Attending: General Surgery | Admitting: General Surgery

## 2018-01-14 DIAGNOSIS — C50512 Malignant neoplasm of lower-outer quadrant of left female breast: Secondary | ICD-10-CM | POA: Diagnosis present

## 2018-01-14 DIAGNOSIS — R921 Mammographic calcification found on diagnostic imaging of breast: Secondary | ICD-10-CM | POA: Diagnosis present

## 2018-01-14 DIAGNOSIS — Z170 Estrogen receptor positive status [ER+]: Secondary | ICD-10-CM | POA: Insufficient documentation

## 2018-01-14 HISTORY — PX: BREAST BIOPSY: SHX20

## 2018-01-15 LAB — SURGICAL PATHOLOGY

## 2018-01-22 ENCOUNTER — Other Ambulatory Visit: Payer: Self-pay | Admitting: *Deleted

## 2018-01-22 ENCOUNTER — Encounter: Payer: Self-pay | Admitting: *Deleted

## 2018-01-22 DIAGNOSIS — C50912 Malignant neoplasm of unspecified site of left female breast: Secondary | ICD-10-CM

## 2018-01-22 DIAGNOSIS — Z170 Estrogen receptor positive status [ER+]: Principal | ICD-10-CM

## 2018-01-22 NOTE — Progress Notes (Signed)
  Oncology Nurse Navigator Documentation  Navigator Location: [facility] (01/22/18 1400) Referral date to RadOnc/MedOnc: 01/06/18 (01/22/18 1400) )Navigator Encounter Type: Telephone (01/22/18 1400) Telephone: Outgoing Call (01/22/18 1400)                       Barriers/Navigation Needs: Coordination of Care (01/22/18 1400)   Interventions: Coordination of Care (01/22/18 1400)   Coordination of Care: Appts (01/22/18 1400)                  Time Spent with Patient: 30 (01/22/18 1400)   Discussed patient with Dr. Rogue Bussing today.  He requested I assist with setting patient up for her breast MRI as we had discussed in breast case conference on Monday.  I have discussed the plan with the patient, and informed her of her appointment for bilateral breast MRI at [facility] on 02/05/18 @ 10:45.  Patient will have time to arrange for transportation.  Will cc Dr. Rogue Bussing and notify Margreta Journey in insurance authorization.  Patient will need follow up appointment with Dr. Celine Ahr and Dr. Rogue Bussing.

## 2018-02-05 ENCOUNTER — Ambulatory Visit
Admission: RE | Admit: 2018-02-05 | Discharge: 2018-02-05 | Disposition: A | Discharge status: Home / Self Care | Payer: Medicare HMO | Source: Ambulatory Visit | Attending: Internal Medicine | Admitting: Internal Medicine

## 2018-02-05 DIAGNOSIS — C50912 Malignant neoplasm of unspecified site of left female breast: Secondary | ICD-10-CM | POA: Diagnosis not present

## 2018-02-05 DIAGNOSIS — Z170 Estrogen receptor positive status [ER+]: Secondary | ICD-10-CM | POA: Diagnosis present

## 2018-02-05 LAB — POCT I-STAT CREATININE: Creatinine, Ser: 0.8 mg/dL (ref 0.44–1.00)

## 2018-02-05 MED ORDER — GADOBUTROL 1 MMOL/ML IV SOLN
8.0000 mL | Freq: Once | INTRAVENOUS | Status: AC | PRN
  Administered 2018-02-05: 8 mL via INTRAVENOUS

## 2018-02-13 ENCOUNTER — Encounter: Payer: Self-pay | Admitting: *Deleted

## 2018-02-13 DIAGNOSIS — M79604 Pain in right leg: Secondary | ICD-10-CM | POA: Insufficient documentation

## 2018-02-13 DIAGNOSIS — R202 Paresthesia of skin: Secondary | ICD-10-CM

## 2018-02-13 DIAGNOSIS — M79605 Pain in left leg: Secondary | ICD-10-CM | POA: Insufficient documentation

## 2018-02-13 DIAGNOSIS — Z87828 Personal history of other (healed) physical injury and trauma: Secondary | ICD-10-CM | POA: Insufficient documentation

## 2018-02-13 DIAGNOSIS — R200 Anesthesia of skin: Secondary | ICD-10-CM | POA: Insufficient documentation

## 2018-02-13 NOTE — Progress Notes (Signed)
  Oncology Nurse Navigator Documentation  Navigator Location: [facility] (02/13/18 1100)   )Navigator Encounter Type: Telephone (02/13/18 1100) Telephone: Incoming Call (02/13/18 1100)                       Barriers/Navigation Needs: Coordination of Care (02/13/18 1100)                          Time Spent with Patient: 15 (02/13/18 1100)   Patient called today to see if we had the results of her breast MRI that was completed on 02/05/18.  They are not available in Epic.  I have sent a message to [facility] in the breast center to follow up on the results.  Patient states she does not have a follow-up appointment.  I will send a message to Dr. Rogue Bussing and Dr. Celine Ahr and will schedule patient for follow-up appointment with one of them.  Patient is aware I will call her back tomorrow with more information.

## 2018-02-14 ENCOUNTER — Encounter: Payer: Self-pay | Admitting: *Deleted

## 2018-02-14 NOTE — Progress Notes (Signed)
  Oncology Nurse Navigator Documentation  Navigator Location: [facility] (02/14/18 1100)   )Navigator Encounter Type: Telephone (02/14/18 1100) Telephone: Outgoing Call (02/14/18 1100)                       Barriers/Navigation Needs: Coordination of Care (02/14/18 1100)                          Time Spent with Patient: 45 (02/14/18 1100)   Called radiology to see why the patients MRI has not been read.  It was a cross over problem that has been resolved, and should be read today.  Informed Dr. Aletha Halim nurse Nira Conn that patient wants her results.  Nira Conn states she will have Dr. Rogue Bussing call the patient when results are available.  I have also scheduled her follow up with Dr. Celine Ahr on 02/24/18 @ 10:15 to discuss final surgery plans.  Dr. Celine Ahr will not be back in the office until then.  Informed patient that she should get a call today from Dr. Rogue Bussing, but if not, she is to call me on Monday.  Also informed her of her appointment with Dr. Celine Ahr.  She is agreeable to the plan.

## 2018-02-17 ENCOUNTER — Other Ambulatory Visit: Payer: Self-pay | Admitting: Neurology

## 2018-02-17 DIAGNOSIS — R432 Parageusia: Secondary | ICD-10-CM

## 2018-02-17 DIAGNOSIS — R200 Anesthesia of skin: Secondary | ICD-10-CM

## 2018-02-17 DIAGNOSIS — Z87828 Personal history of other (healed) physical injury and trauma: Secondary | ICD-10-CM

## 2018-02-17 DIAGNOSIS — M79604 Pain in right leg: Secondary | ICD-10-CM

## 2018-02-18 ENCOUNTER — Telehealth: Payer: Self-pay | Admitting: Internal Medicine

## 2018-02-18 ENCOUNTER — Other Ambulatory Visit: Payer: Self-pay | Admitting: *Deleted

## 2018-02-18 DIAGNOSIS — C50912 Malignant neoplasm of unspecified site of left female breast: Secondary | ICD-10-CM

## 2018-02-18 DIAGNOSIS — N63 Unspecified lump in unspecified breast: Secondary | ICD-10-CM

## 2018-02-18 NOTE — Telephone Encounter (Signed)
MRIs reviewed at the tumor conference; left axillary lymph node MRI suggestive of benign [overlap of 2 lymph nodes].  Suggest breast biopsy.  I called pt to review the results of the breast MRI/ need for repeat Bx under MR guidance. Discussed the rationale for repeat biopsy.   Pt concerned about transport issues to [place].   Sheena/Anne-can you please check if we have any resources for the patient's transportation to [facility] for biopsy?Marland Kitchen  And inform her either yay or nay.   Thanks GB

## 2018-02-19 ENCOUNTER — Encounter: Payer: Self-pay | Admitting: *Deleted

## 2018-02-19 NOTE — Progress Notes (Signed)
  Oncology Nurse Navigator Documentation  Navigator Location: [facility] (02/19/18 0800)   )Navigator Encounter Type: Telephone (02/19/18 0800) Telephone: Lahoma Crocker Call (02/19/18 0800)                       Barriers/Navigation Needs: Coordination of Care (02/19/18 0800)                          Time Spent with Patient: 30 (02/19/18 0800)   Patient called with questions regarding her follow-up with Dr. Celine Ahr and changing the date since she is having an MRI guided biopsy.  She also informed Dr. Rogue Bussing she had transportation issues to get to [name].  Left message to return my call.  Offered cab service, and to change the follow up appointment to after her biopsy.

## 2018-02-20 ENCOUNTER — Telehealth: Payer: Self-pay | Admitting: *Deleted

## 2018-02-20 ENCOUNTER — Encounter: Payer: Self-pay | Admitting: *Deleted

## 2018-02-20 ENCOUNTER — Other Ambulatory Visit: Payer: Self-pay | Admitting: *Deleted

## 2018-02-20 DIAGNOSIS — N63 Unspecified lump in unspecified breast: Secondary | ICD-10-CM

## 2018-02-20 NOTE — Telephone Encounter (Signed)
Message left for patient to call the office.   Patient cancelled appointment for 02-24-18 with Dr. Celine Logan via the automated reminder system. We need to get appointment rescheduled.

## 2018-02-20 NOTE — Progress Notes (Signed)
  Oncology Nurse Navigator Documentation  Navigator Location: [facility] (02/20/18 1100)   )Navigator Encounter Type: Telephone (02/20/18 1100) Telephone: Lahoma Crocker Call (02/20/18 1100)                       Barriers/Navigation Needs: Transportation;Coordination of Care (02/20/18 1100)   Interventions: Transportation;Coordination of Care (02/20/18 1100)                      Time Spent with Patient: 30 (02/20/18 1100)   Talked to patient today.  She has cancelled her surgical appointment and will reschedule after MRI biopsy.  States she heard from the Imaging center, but has not scheduled her appointment.  She is calling them back today.  Informed back that if her transportation cannot take her that far, we could get her a cab voucher to take back and forth.  She is to let me know if she any transportation barriers.

## 2018-02-24 ENCOUNTER — Other Ambulatory Visit (HOSPITAL_COMMUNITY): Payer: Self-pay | Admitting: Diagnostic Radiology

## 2018-02-24 ENCOUNTER — Ambulatory Visit
Admission: RE | Admit: 2018-02-24 | Discharge: 2018-02-24 | Disposition: A | Discharge status: Home / Self Care | Payer: Medicare Other | Source: Ambulatory Visit | Attending: Internal Medicine | Admitting: Internal Medicine

## 2018-02-24 ENCOUNTER — Ambulatory Visit: Payer: Medicare HMO | Admitting: General Surgery

## 2018-02-24 DIAGNOSIS — N63 Unspecified lump in unspecified breast: Secondary | ICD-10-CM

## 2018-02-24 DIAGNOSIS — C50912 Malignant neoplasm of unspecified site of left female breast: Secondary | ICD-10-CM

## 2018-02-24 MED ORDER — GADOBUTROL 1 MMOL/ML IV SOLN
9.0000 mL | Freq: Once | INTRAVENOUS | Status: AC | PRN
  Administered 2018-02-24: 9 mL via INTRAVENOUS

## 2018-02-25 ENCOUNTER — Ambulatory Visit
Admission: RE | Admit: 2018-02-25 | Discharge: 2018-02-25 | Disposition: A | Discharge status: Home / Self Care | Payer: Medicare Other | Source: Ambulatory Visit | Attending: Neurology | Admitting: Neurology

## 2018-02-25 DIAGNOSIS — R432 Parageusia: Secondary | ICD-10-CM

## 2018-02-25 DIAGNOSIS — Z87828 Personal history of other (healed) physical injury and trauma: Secondary | ICD-10-CM

## 2018-02-25 DIAGNOSIS — R200 Anesthesia of skin: Secondary | ICD-10-CM | POA: Insufficient documentation

## 2018-02-25 DIAGNOSIS — M79604 Pain in right leg: Secondary | ICD-10-CM | POA: Diagnosis present

## 2018-02-26 ENCOUNTER — Other Ambulatory Visit: Payer: Self-pay

## 2018-02-26 ENCOUNTER — Encounter: Payer: Self-pay | Admitting: General Surgery

## 2018-02-26 ENCOUNTER — Ambulatory Visit (INDEPENDENT_AMBULATORY_CARE_PROVIDER_SITE_OTHER): Payer: Medicare Other | Admitting: General Surgery

## 2018-02-26 VITALS — BP 133/89 | HR 80 | Temp 95.4°F | Ht 66.0 in | Wt 188.0 lb

## 2018-02-26 DIAGNOSIS — Z170 Estrogen receptor positive status [ER+]: Secondary | ICD-10-CM

## 2018-02-26 DIAGNOSIS — C50512 Malignant neoplasm of lower-outer quadrant of left female breast: Secondary | ICD-10-CM | POA: Diagnosis not present

## 2018-02-26 NOTE — Patient Instructions (Addendum)
Patient will need to return to the office as needed.    The patient is aware to call back for any questions or new concerns.
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
## 2018-03-03 ENCOUNTER — Encounter: Payer: Self-pay | Admitting: General Surgery

## 2018-03-03 ENCOUNTER — Other Ambulatory Visit: Payer: Self-pay

## 2018-03-03 DIAGNOSIS — Z170 Estrogen receptor positive status [ER+]: Principal | ICD-10-CM

## 2018-03-03 DIAGNOSIS — C50512 Malignant neoplasm of lower-outer quadrant of left female breast: Secondary | ICD-10-CM

## 2018-03-10 ENCOUNTER — Other Ambulatory Visit: Payer: Self-pay

## 2018-03-10 ENCOUNTER — Ambulatory Visit (INDEPENDENT_AMBULATORY_CARE_PROVIDER_SITE_OTHER): Payer: Medicare Other | Admitting: Psychiatry

## 2018-03-10 ENCOUNTER — Encounter: Payer: Self-pay | Admitting: Psychiatry

## 2018-03-10 VITALS — BP 136/89 | HR 80 | Temp 98.2°F | Wt 188.2 lb

## 2018-03-10 DIAGNOSIS — F410 Panic disorder [episodic paroxysmal anxiety] without agoraphobia: Secondary | ICD-10-CM

## 2018-03-10 DIAGNOSIS — F4010 Social phobia, unspecified: Secondary | ICD-10-CM

## 2018-03-10 DIAGNOSIS — F4310 Post-traumatic stress disorder, unspecified: Secondary | ICD-10-CM

## 2018-03-10 DIAGNOSIS — F424 Excoriation (skin-picking) disorder: Secondary | ICD-10-CM

## 2018-03-10 DIAGNOSIS — Z8659 Personal history of other mental and behavioral disorders: Secondary | ICD-10-CM

## 2018-03-10 MED ORDER — QUETIAPINE FUMARATE 25 MG PO TABS: 25.0000 mg | ORAL_TABLET | Freq: Every day | ORAL | 0 refills | Status: DC

## 2018-03-10 MED ORDER — HYDROXYZINE HCL 25 MG PO TABS: 25.0000 mg | ORAL_TABLET | Freq: Three times a day (TID) | ORAL | 0 refills | Status: DC | PRN

## 2018-03-10 NOTE — Patient Instructions (Signed)
Quetiapine tablets  What is this medicine?  QUETIAPINE (kwe TYE a peen) is an antipsychotic. It is used to treat schizophrenia and bipolar disorder, also known as manic-depression.  This medicine may be used for other purposes; ask your health care provider or pharmacist if you have questions.  COMMON BRAND NAME(S): Seroquel  What should I tell my health care provider before I take this medicine?  They need to know if you have any of these conditions:  -blockage in your bowel  -cataracts  -constipation  -dehydration  -diabetes  -difficulty swallowing  -glaucoma  -heart disease  -history of breast cancer  -kidney disease  -liver disease  -low blood counts, like low white cell, platelet, or red cell counts  -low blood pressure or dizziness when standing up  -Parkinson's disease  -previous heart attack  -prostate disease  -seizures  -stomach or intestine problems  -suicidal thoughts, plans or attempt; a previous suicide attempt by you or a family member  -thyroid disease  -trouble passing urine  -an unusual or allergic reaction to quetiapine, other medicines, foods, dyes, or preservatives  -pregnant or trying to get pregnant  -breast-feeding  How should I use this medicine?  Take this medicine by mouth. Swallow it with a drink of water. Follow the directions on the prescription label. If it upsets your stomach you can take it with food. Take your medicine at regular intervals. Do not take it more often than directed. Do not stop taking except on the advice of your doctor or health care professional.  A special MedGuide will be given to you by the pharmacist with each prescription and refill. Be sure to read this information carefully each time.  Talk to your pediatrician regarding the use of this medicine in children. While this drug may be prescribed for children as young as 10 years for selected conditions, precautions do apply.  Patients over age 65 years may have a stronger reaction to this medicine and need  smaller doses.  Overdosage: If you think you have taken too much of this medicine contact a poison control center or emergency room at once.  NOTE: This medicine is only for you. Do not share this medicine with others.  What if I miss a dose?  If you miss a dose, take it as soon as you can. If it is almost time for your next dose, take only that dose. Do not take double or extra doses.  What may interact with this medicine?  Do not take this medicine with any of the following medications:  -cisapride  -dofetilide  -dronedarone  -fluconazole  -metoclopramide  -pimozide  -posaconazole  -thioridazine  This medicine may also interact with the following medications:  -alcohol  -antihistamines for allergy cough and cold  -antiviral medicines for HIV or AIDS  -atropine  -certain medicines for bladder problems like oxybutynin, tolterodine  -certain medicines for blood pressure  -certain medicines for depression, anxiety, or psychotic disturbances  -certain medicines for diabetes  -certain medicines for stomach problems like dicyclomine, hyoscyamine  -certain medicines for travel sickness like scopolamine  -certain medicines for Parkinson's disease  -certain medicines for seizures like carbamazepine, phenobarbital, phenytoin  -cimetidine  -erythromycin  -ipratropium  -other medicines that prolong the QT interval (cause an abnormal heart rhythm)  -rifampin  -steroid medicines like prednisone or cortisone  This list may not describe all possible interactions. Give your health care provider a list of all the medicines, herbs, non-prescription drugs, or dietary supplements you use. Also   tell them if you smoke, drink alcohol, or use illegal drugs. Some items may interact with your medicine.  What should I watch for while using this medicine?  Visit your doctor or health care professional for regular checks on your progress. It may be several weeks before you see the full effects of this medicine.  Your health care provider may  suggest that you have your eyes examined prior to starting this medicine, and every 6 months thereafter.  If you have been taking this medicine regularly for some time, do not suddenly stop taking it. You must gradually reduce the dose or your symptoms may get worse. Ask your doctor or health care professional for advice.  Patients and their families should watch out for worsening depression or thoughts of suicide. Also watch out for sudden or severe changes in feelings such as feeling anxious, agitated, panicky, irritable, hostile, aggressive, impulsive, severely restless, overly excited and hyperactive, or not being able to sleep. If this happens, especially at the beginning of antidepressant treatment or after a change in dose, call your health care professional.  You may get dizzy or drowsy. Do not drive, use machinery, or do anything that needs mental alertness until you know how this medicine affects you. Do not stand or sit up quickly, especially if you are an older patient. This reduces the risk of dizzy or fainting spells. Alcohol can increase dizziness and drowsiness. Avoid alcoholic drinks.  Do not treat yourself for colds, diarrhea or allergies. Ask your doctor or health care professional for advice, some ingredients may increase possible side effects.  This medicine can reduce the response of your body to heat or cold. Dress warm in cold weather and stay hydrated in hot weather. If possible, avoid extreme temperatures like saunas, hot tubs, very hot or cold showers, or activities that can cause dehydration such as vigorous exercise.  What side effects may I notice from receiving this medicine?  Side effects that you should report to your doctor or health care professional as soon as possible:  -allergic reactions like skin rash, itching or hives, swelling of the face, lips, or tongue  -changes in vision  -difficulty swallowing  -elevated mood, decreased need for sleep, racing thoughts, impulsive  behavior  -eye pain  -redness, blistering, peeling, or loosening of the skin, including inside the mouth  -restlessness, pacing, inability to keep still  -seizures  -signs and symptoms of a dangerous change in heartbeat or heart rhythm like chest pain; dizziness; fast, irregular heartbeat; palpitations; feeling faint or lightheaded; falls; breathing problems  -signs and symptoms of high blood sugar such as dizziness; dry mouth; dry skin; fruity breath; nausea; stomach pain; increased hunger; increased thirst; increased urination  -signs and symptoms of hypothyroidism like fatigue; increased sensitivity to cold; weight gain; hoarseness; thinning hair  -signs and symptoms of infection like fever; chills; cough; sore throat; pain or trouble passing urine  -signs and symptoms of low blood pressure like dizziness; feeling faint or lightheaded; falls; unusually weak or tired  -signs and symptoms of neuroleptic malignant syndrome (NMS) like confusion; fast, irregular heartbeat; high fever; increased sweating; stiff muscles  -signs and symptoms of a stroke like changes in vision; confusion; trouble speaking or understanding; severe headaches; sudden numbness or weakness of the face, arm or leg; trouble walking; dizziness; loss of balance or coordination  -signs and symptoms of tardive dyskinesia, like uncontrollable head, mouth, neck, arm, or leg movements  -suicidal thoughts, mood changes  Side effects that usually do   not require medical attention (report to your doctor or health care professional if they continue or are bothersome):  -change in sex drive or performance  -constipation  -drowsiness  -dry mouth  -upset stomach  -weight gain  This list may not describe all possible side effects. Call your doctor for medical advice about side effects. You may report side effects to FDA at 1-800-FDA-1088.  Where should I keep my medicine?  Keep out of the reach of children.  Store at room temperature between 15 and 30 degrees C  (59 and 86 degrees F). Throw away any unused medicine after the expiration date.  NOTE: This sheet is a summary. It may not cover all possible information. If you have questions about this medicine, talk to your doctor, pharmacist, or health care provider.   2019 [employer] Standard (2016-12-14 14:16:00)
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
## 2018-03-12 DIAGNOSIS — G8929 Other chronic pain: Secondary | ICD-10-CM | POA: Insufficient documentation

## 2018-03-12 DIAGNOSIS — M25552 Pain in left hip: Secondary | ICD-10-CM | POA: Insufficient documentation

## 2018-03-17 DIAGNOSIS — R519 Headache, unspecified: Secondary | ICD-10-CM | POA: Insufficient documentation

## 2018-03-21 ENCOUNTER — Institutional Professional Consult (permissible substitution): Payer: Medicare Other | Admitting: Plastic Surgery

## 2018-03-21 ENCOUNTER — Ambulatory Visit (INDEPENDENT_AMBULATORY_CARE_PROVIDER_SITE_OTHER): Payer: Medicare Other | Admitting: Plastic Surgery

## 2018-03-21 ENCOUNTER — Other Ambulatory Visit: Payer: Self-pay

## 2018-03-21 ENCOUNTER — Encounter: Payer: Self-pay | Admitting: Plastic Surgery

## 2018-03-21 VITALS — BP 161/81 | HR 89 | Temp 98.5°F | Ht 66.0 in | Wt 187.6 lb

## 2018-03-21 DIAGNOSIS — Z170 Estrogen receptor positive status [ER+]: Secondary | ICD-10-CM

## 2018-03-21 DIAGNOSIS — C50512 Malignant neoplasm of lower-outer quadrant of left female breast: Secondary | ICD-10-CM
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
## 2018-03-25 ENCOUNTER — Other Ambulatory Visit: Payer: Self-pay

## 2018-03-25 ENCOUNTER — Telehealth: Payer: Self-pay | Admitting: Internal Medicine

## 2018-03-25 DIAGNOSIS — Z170 Estrogen receptor positive status [ER+]: Principal | ICD-10-CM

## 2018-03-25 DIAGNOSIS — C50512 Malignant neoplasm of lower-outer quadrant of left female breast: Secondary | ICD-10-CM

## 2018-03-25 NOTE — Telephone Encounter (Signed)
Dr. Rogue Bussing, Kelli Logan will follow-up with her tomorrow.  She saw Dr. Marla Roe last week, but notes are not in.

## 2018-03-25 NOTE — Telephone Encounter (Signed)
Sheena/anne-please follow-up on this patient.  Patient has not had surgery yet-I do not know the circumstances.

## 2018-03-26 ENCOUNTER — Ambulatory Visit: Payer: Self-pay | Admitting: Psychiatry

## 2018-03-26 ENCOUNTER — Telehealth: Payer: Self-pay | Admitting: *Deleted

## 2018-03-26 ENCOUNTER — Encounter: Payer: Self-pay | Admitting: *Deleted

## 2018-03-26 NOTE — Telephone Encounter (Signed)
Patient agreeable to surgery on 04-08-18. Dr. Celine Ahr will be doing the surgery with Dr. Rosana Hoes to assist. Dr. Marla Roe to follow with immediate tissue expander reconstruction.   The patient will be contacted once surgery has been scheduled with [facility] in regards to details.

## 2018-03-26 NOTE — Progress Notes (Signed)
  Oncology Nurse Navigator Documentation  Navigator Location: [facility] Onc (03/26/18 0900)   )Navigator Encounter Type: Telephone (03/26/18 0900) Telephone: Lahoma Crocker Call (03/26/18 0900)                       Barriers/Navigation Needs: Coordination of Care (03/26/18 0900)                          Time Spent with Patient: 15 (03/26/18 0900)   Tried to call patient to see where she is with her surgical planning.  No answer.  Will try again later.

## 2018-03-26 NOTE — Telephone Encounter (Signed)
-----   Message from Chrystie Nose, [place] sent at 03/25/2018  9:00 AM EDT ----- Regarding: FW: schedule combined surgery  ----- Message ----- From: Fredirick Maudlin, MD Sent: 03/24/2018  12:51 PM EDT To: [facility]anti Clinical Pool Subject: schedule combined surgery                      We need to schedule this patient for a combined surgery with Dr. Marla Roe. She needs a left simple mastectomy with sentinel lymph node biopsy and immediate tissue expander reconstruction.  Thanks! --Lavella Hammock

## 2018-03-27 ENCOUNTER — Encounter: Payer: Self-pay | Admitting: *Deleted

## 2018-03-27 NOTE — Telephone Encounter (Signed)
Patient's surgery has been scheduled for 04-08-18 at [facility] with Dr. Celine Ahr. Dr. Rosana Hoes will be assisting with this case. This is a combined case with Dr. Marla Roe.   Patient aware to check in at the radiology desk, 2nd desk on right in [place] at 7:45 am.  The patient is aware she will be contacted by the [facility] to complete a phone interview sometime in the near future.  The patient is aware to call the office should she have further questions.

## 2018-03-27 NOTE — Progress Notes (Signed)
  Oncology Nurse Navigator Documentation  Navigator Location: [facility] (03/27/18 1100)   )Navigator Encounter Type: Telephone (03/27/18 1100) Telephone: Incoming Call (03/27/18 1100)                       Barriers/Navigation Needs: Coordination of Care (03/27/18 1100)                          Time Spent with Patient: 30 (03/27/18 1100)   Patient returned my call.  She is scheduled for mastectomy with tissue expanders on 04/08/18.  Asked about mastectomy supplies.  She is going to go to [employer] or look at the knitted knockers we have here at the [facility].  Informed patient I would let Dr. Rogue Bussing know her surgery date and to expect a call from one of the schedulers with a follow up appointment to see him about 2 weeks after surgery.  She is to call if she has any questions or needs.

## 2018-03-28 ENCOUNTER — Other Ambulatory Visit: Payer: Self-pay | Admitting: General Surgery

## 2018-03-28 DIAGNOSIS — Z170 Estrogen receptor positive status [ER+]: Secondary | ICD-10-CM

## 2018-03-28 DIAGNOSIS — C50512 Malignant neoplasm of lower-outer quadrant of left female breast: Secondary | ICD-10-CM

## 2018-03-31 ENCOUNTER — Ambulatory Visit (INDEPENDENT_AMBULATORY_CARE_PROVIDER_SITE_OTHER): Payer: Medicare Other | Admitting: Licensed Clinical Social Worker

## 2018-03-31 ENCOUNTER — Other Ambulatory Visit: Payer: Self-pay

## 2018-03-31 ENCOUNTER — Encounter: Payer: Self-pay | Admitting: Licensed Clinical Social Worker

## 2018-03-31 ENCOUNTER — Encounter
Admission: RE | Admit: 2018-03-31 | Discharge: 2018-03-31 | Disposition: A | Discharge status: Home / Self Care | Payer: Medicare Other | Source: Ambulatory Visit | Attending: General Surgery | Admitting: General Surgery

## 2018-03-31 DIAGNOSIS — F4310 Post-traumatic stress disorder, unspecified: Secondary | ICD-10-CM | POA: Diagnosis not present

## 2018-03-31 HISTORY — DX: Personal history of urinary calculi: Z87.442

## 2018-03-31 HISTORY — DX: Malignant (primary) neoplasm, unspecified: C80.1

## 2018-03-31 NOTE — Patient Instructions (Signed)
Your procedure is scheduled on: 04-08-18 TUESDAY Report to [facility] (2ND DESK ON RIGHT) @ 7:45 AM  Remember: Instructions that are not followed completely may result in serious medical risk, up to and including death, or upon the discretion of your surgeon and anesthesiologist your surgery may need to be rescheduled.    _x___ 1. Do not eat food after midnight the night before your procedure. NO GUM OR CANDY AFTER MIDNIGHT.  You may drink clear liquids up to 2 hours before you are scheduled to arrive at the hospital for your procedure.  Do not drink clear liquids within 2 hours of your scheduled arrival to the hospital.  Clear liquids include  --Water or Apple juice without pulp  --Clear carbohydrate beverage such as ClearFast or Gatorade  --Black Coffee or Clear Tea (No milk, no creamers, do not add anything to the coffee or Tea   ____Ensure clear carbohydrate drink on the way to the hospital for bariatric patients  ____Ensure clear carbohydrate drink 3 hours before surgery for Dr Dwyane Luo patients if physician instructed.     __x__ 2. No Alcohol for 24 hours before or after surgery.   __x__3. No Smoking or e-cigarettes for 24 prior to surgery.  Do not use any chewable tobacco products for at least 6 hour prior to surgery   ____  4. Bring all medications with you on the day of surgery if instructed.    __x__ 5. Notify your doctor if there is any change in your medical condition     (cold, fever, infections).    x___6. On the morning of surgery brush your teeth with toothpaste and water.  You may rinse your mouth with mouth wash if you wish.  Do not swallow any toothpaste or mouthwash.   Do not wear jewelry, make-up, hairpins, clips or nail polish.  Do not wear lotions, powders, or perfumes. You may wear deodorant.  Do not shave 48 hours prior to surgery. Men may shave face and neck.  Do not bring valuables to the hospital.    [facility] is not responsible for any  belongings or valuables.               Contacts, dentures or bridgework may not be worn into surgery.  Leave your suitcase in the car. After surgery it may be brought to your room.  For patients admitted to the hospital, discharge time is determined by your treatment team.  _  Patients discharged the day of surgery will not be allowed to drive home.  You will need someone to drive you home and stay with you the night of your procedure.    Please read over the following fact sheets that you were given:   [facility] Preparing for Surgery   _x___ TAKE THE FOLLOWING MEDICATION THE MORNING OF SURGERY WITH A SMALL SIP OF WATER. These include:  1. XANAX (ALPRAZOLAM)  2. CELEXA (CITALOPRAM)  3. LEVOTHYROXINE (SYNTHROID)  4. METOPROLOL  5. PRILOSEC (OMEPRAZOLE)  6. TAKE AN EXTRA PRILOSEC THE NIGHT BEFORE YOUR SURGERY  ____Fleets enema or Magnesium Citrate as directed.   _x___ Use CHG Soap or sage wipes as directed on instruction sheet   ____ Use inhalers on the day of surgery and bring to hospital day of surgery  ____ Stop Metformin and Janumet 2 days prior to surgery.    ____ Take 1/2 of usual insulin dose the night before surgery and none on the morning  surgery.   ____ Follow  recommendations from Cardiologist, Pulmonologist or PCP regarding stopping Aspirin, Coumadin, Plavix ,Eliquis, Effient, or Pradaxa, and Pletal.  X____Stop Anti-inflammatories such as Advil, Aleve, Ibuprofen, Motrin, Naproxen, Naprosyn, Goodies powders or aspirin products NOW-OK to take Tylenol   _x___ Stop supplements until after surgery-STOP [place]   ____ Bring C-Pap to the hospital.
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
## 2018-04-01 ENCOUNTER — Ambulatory Visit (INDEPENDENT_AMBULATORY_CARE_PROVIDER_SITE_OTHER): Payer: Medicare Other | Admitting: Plastic Surgery

## 2018-04-01 ENCOUNTER — Encounter: Payer: Self-pay | Admitting: *Deleted

## 2018-04-01 ENCOUNTER — Encounter: Payer: Self-pay | Admitting: Plastic Surgery

## 2018-04-01 VITALS — BP 136/91 | HR 84 | Temp 98.0°F | Ht 66.25 in | Wt 187.6 lb

## 2018-04-01 DIAGNOSIS — C50512 Malignant neoplasm of lower-outer quadrant of left female breast: Secondary | ICD-10-CM

## 2018-04-01 DIAGNOSIS — Z170 Estrogen receptor positive status [ER+]: Secondary | ICD-10-CM

## 2018-04-01 MED ORDER — DIAZEPAM 2 MG PO TABS: 2.0000 mg | ORAL_TABLET | Freq: Two times a day (BID) | ORAL | 0 refills | Status: DC | PRN

## 2018-04-01 MED ORDER — ONDANSETRON HCL 4 MG PO TABS: 4.0000 mg | ORAL_TABLET | Freq: Three times a day (TID) | ORAL | 0 refills | Status: AC | PRN

## 2018-04-01 MED ORDER — CEPHALEXIN 500 MG PO CAPS: 500.0000 mg | ORAL_CAPSULE | Freq: Four times a day (QID) | ORAL | 0 refills | Status: AC

## 2018-04-01 MED ORDER — HYDROCODONE-ACETAMINOPHEN 5-325 MG PO TABS: 1.0000 | ORAL_TABLET | Freq: Four times a day (QID) | ORAL | 0 refills | Status: DC | PRN

## 2018-04-01 MED ORDER — HYDROCODONE-ACETAMINOPHEN 10-325 MG PO TABS: 1.0000 | ORAL_TABLET | Freq: Three times a day (TID) | ORAL | 0 refills | Status: DC | PRN

## 2018-04-01 MED ORDER — CEPHALEXIN 500 MG PO CAPS: 500.0000 mg | ORAL_CAPSULE | Freq: Four times a day (QID) | ORAL | 0 refills | Status: DC

## 2018-04-01 MED ORDER — ONDANSETRON HCL 4 MG PO TABS: 4.0000 mg | ORAL_TABLET | Freq: Three times a day (TID) | ORAL | 0 refills | Status: DC | PRN

## 2018-04-01 NOTE — Progress Notes (Signed)
Dr. Celine Ahr will update H&P the morning of patient's surgery.   Surgery scheduled for 04-08-18.
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
## 2018-04-02 ENCOUNTER — Encounter
Admission: RE | Admit: 2018-04-02 | Discharge: 2018-04-02 | Disposition: A | Discharge status: Home / Self Care | Payer: Medicare Other | Source: Ambulatory Visit | Attending: General Surgery | Admitting: General Surgery

## 2018-04-02 ENCOUNTER — Telehealth: Payer: Self-pay | Admitting: *Deleted

## 2018-04-02 ENCOUNTER — Other Ambulatory Visit: Payer: Self-pay

## 2018-04-02 DIAGNOSIS — Z01818 Encounter for other preprocedural examination: Secondary | ICD-10-CM | POA: Insufficient documentation

## 2018-04-02 DIAGNOSIS — I10 Essential (primary) hypertension: Secondary | ICD-10-CM | POA: Diagnosis not present

## 2018-04-02 LAB — POTASSIUM: Potassium: 3.8 mmol/L (ref 3.5–5.1)

## 2018-04-02 NOTE — Telephone Encounter (Signed)
Patient notified that per Dr. Celine Ahr, this will be okay.   The patient verbalizes understanding.
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
## 2018-04-03 ENCOUNTER — Telehealth: Payer: Self-pay | Admitting: Plastic Surgery

## 2018-04-03 NOTE — Telephone Encounter (Signed)
Patient called requesting prescription for compression bra to [facility] for upcoming left breast mastectomy on 04/08/2018. Prescription faxed to [facility].

## 2018-04-07 MED ORDER — CEFAZOLIN SODIUM-DEXTROSE 2-4 GM/100ML-% IV SOLN
2.0000 g | INTRAVENOUS | Status: AC
  Administered 2018-04-08: 2 g via INTRAVENOUS

## 2018-04-08 ENCOUNTER — Encounter: Payer: Self-pay | Admitting: *Deleted

## 2018-04-08 ENCOUNTER — Other Ambulatory Visit: Payer: Self-pay

## 2018-04-08 ENCOUNTER — Observation Stay
Admission: RE | Admit: 2018-04-08 | Discharge: 2018-04-09 | Disposition: A | Discharge status: Home / Self Care | Payer: Medicare Other | Attending: General Surgery | Admitting: General Surgery

## 2018-04-08 ENCOUNTER — Ambulatory Visit: Payer: Medicare Other | Admitting: Anesthesiology

## 2018-04-08 ENCOUNTER — Ambulatory Visit
Admission: RE | Admit: 2018-04-08 | Discharge: 2018-04-08 | Disposition: A | Discharge status: Home / Self Care | Payer: Medicare Other | Source: Ambulatory Visit | Attending: General Surgery | Admitting: General Surgery

## 2018-04-08 ENCOUNTER — Encounter
Admission: RE | Disposition: A | Discharge status: Home / Self Care | Payer: Self-pay | Source: Home / Self Care | Attending: General Surgery

## 2018-04-08 DIAGNOSIS — C50512 Malignant neoplasm of lower-outer quadrant of left female breast: Principal | ICD-10-CM

## 2018-04-08 DIAGNOSIS — Z87891 Personal history of nicotine dependence: Secondary | ICD-10-CM | POA: Insufficient documentation

## 2018-04-08 DIAGNOSIS — Z170 Estrogen receptor positive status [ER+]: Secondary | ICD-10-CM

## 2018-04-08 DIAGNOSIS — I10 Essential (primary) hypertension: Secondary | ICD-10-CM | POA: Diagnosis not present

## 2018-04-08 DIAGNOSIS — C50412 Malignant neoplasm of upper-outer quadrant of left female breast: Secondary | ICD-10-CM | POA: Diagnosis present

## 2018-04-08 DIAGNOSIS — C50912 Malignant neoplasm of unspecified site of left female breast: Secondary | ICD-10-CM

## 2018-04-08 DIAGNOSIS — C50919 Malignant neoplasm of unspecified site of unspecified female breast: Secondary | ICD-10-CM | POA: Diagnosis present

## 2018-04-08 HISTORY — PX: MASTECTOMY W/ SENTINEL NODE BIOPSY: SHX2001

## 2018-04-08 HISTORY — PX: BREAST RECONSTRUCTION WITH PLACEMENT OF TISSUE EXPANDER AND ALLODERM: SHX6805

## 2018-04-08 LAB — GLUCOSE, CAPILLARY
GLUCOSE-CAPILLARY: 128 mg/dL — AB (ref 70–99)
Glucose-Capillary: 74 mg/dL (ref 70–99)

## 2018-04-08 SURGERY — MASTECTOMY WITH SENTINEL LYMPH NODE BIOPSY
Anesthesia: General | Laterality: Left

## 2018-04-08 MED ORDER — CHLORHEXIDINE GLUCONATE CLOTH 2 % EX PADS: 6.0000 | MEDICATED_PAD | Freq: Once | CUTANEOUS | Status: DC

## 2018-04-08 MED ORDER — DIPHENHYDRAMINE HCL 50 MG/ML IJ SOLN: 12.5000 mg | Freq: Four times a day (QID) | INTRAMUSCULAR | Status: DC | PRN

## 2018-04-08 MED ORDER — BUPIVACAINE HCL (PF) 0.25 % IJ SOLN
INTRAMUSCULAR | Status: AC
  Filled 2018-04-08: qty 30

## 2018-04-08 MED ORDER — ONDANSETRON 4 MG PO TBDP: 4.0000 mg | ORAL_TABLET | Freq: Four times a day (QID) | ORAL | Status: DC | PRN

## 2018-04-08 MED ORDER — HYDROMORPHONE HCL 1 MG/ML IJ SOLN
0.2500 mg | INTRAMUSCULAR | Status: DC | PRN
  Administered 2018-04-08 (×2): 0.5 mg via INTRAVENOUS

## 2018-04-08 MED ORDER — FENTANYL CITRATE (PF) 100 MCG/2ML IJ SOLN
INTRAMUSCULAR | Status: AC
  Administered 2018-04-08: 25 ug via INTRAVENOUS
  Filled 2018-04-08: qty 2

## 2018-04-08 MED ORDER — CEFAZOLIN SODIUM-DEXTROSE 2-4 GM/100ML-% IV SOLN
2.0000 g | Freq: Three times a day (TID) | INTRAVENOUS | Status: DC
  Administered 2018-04-08 – 2018-04-09 (×2): 2 g via INTRAVENOUS
  Filled 2018-04-08 (×4): qty 100

## 2018-04-08 MED ORDER — PHENYLEPHRINE HCL 10 MG/ML IJ SOLN
INTRAMUSCULAR | Status: DC | PRN
  Administered 2018-04-08 (×2): 100 ug via INTRAVENOUS
  Administered 2018-04-08: 200 ug via INTRAVENOUS
  Administered 2018-04-08 (×3): 100 ug via INTRAVENOUS
  Administered 2018-04-08: 50 ug via INTRAVENOUS
  Administered 2018-04-08 (×5): 100 ug via INTRAVENOUS

## 2018-04-08 MED ORDER — NAPROXEN 500 MG PO TABS
500.0000 mg | ORAL_TABLET | Freq: Two times a day (BID) | ORAL | Status: DC | PRN
  Filled 2018-04-08: qty 1

## 2018-04-08 MED ORDER — DEXAMETHASONE SODIUM PHOSPHATE 10 MG/ML IJ SOLN
INTRAMUSCULAR | Status: DC | PRN
  Administered 2018-04-08: 10 mg via INTRAVENOUS

## 2018-04-08 MED ORDER — ACETAMINOPHEN 500 MG PO TABS
ORAL_TABLET | ORAL | Status: AC
  Administered 2018-04-08: 1000 mg via ORAL
  Filled 2018-04-08: qty 2

## 2018-04-08 MED ORDER — TECHNETIUM TC 99M SULFUR COLLOID FILTERED
0.7420 | Freq: Once | INTRAVENOUS | Status: AC | PRN
  Administered 2018-04-08: 0.742 via INTRADERMAL

## 2018-04-08 MED ORDER — ACETAMINOPHEN 500 MG PO TABS
1000.0000 mg | ORAL_TABLET | ORAL | Status: AC
  Administered 2018-04-08: 1000 mg via ORAL

## 2018-04-08 MED ORDER — ACETAMINOPHEN 325 MG PO TABS
325.0000 mg | ORAL_TABLET | Freq: Four times a day (QID) | ORAL | Status: DC
  Administered 2018-04-08 – 2018-04-09 (×4): 325 mg via ORAL
  Filled 2018-04-08 (×4): qty 1

## 2018-04-08 MED ORDER — DIAZEPAM 2 MG PO TABS
2.0000 mg | ORAL_TABLET | Freq: Two times a day (BID) | ORAL | Status: DC | PRN
  Administered 2018-04-09: 2 mg via ORAL
  Filled 2018-04-08 (×3): qty 1

## 2018-04-08 MED ORDER — GABAPENTIN 300 MG PO CAPS
ORAL_CAPSULE | ORAL | Status: AC
  Administered 2018-04-08: 300 mg via ORAL
  Filled 2018-04-08: qty 1

## 2018-04-08 MED ORDER — LACTATED RINGERS IV SOLN
INTRAVENOUS | Status: DC
  Administered 2018-04-08 (×2): via INTRAVENOUS

## 2018-04-08 MED ORDER — EPHEDRINE SULFATE 50 MG/ML IJ SOLN
INTRAMUSCULAR | Status: AC
  Filled 2018-04-08: qty 1

## 2018-04-08 MED ORDER — BUPIVACAINE-EPINEPHRINE 0.25% -1:200000 IJ SOLN
INTRAMUSCULAR | Status: DC | PRN
  Administered 2018-04-08: 10 mL

## 2018-04-08 MED ORDER — CEFAZOLIN SODIUM-DEXTROSE 2-4 GM/100ML-% IV SOLN
INTRAVENOUS | Status: AC
  Filled 2018-04-08: qty 100

## 2018-04-08 MED ORDER — OXYCODONE HCL 5 MG/5ML PO SOLN: 5.0000 mg | Freq: Once | ORAL | Status: DC | PRN

## 2018-04-08 MED ORDER — MIDAZOLAM HCL 2 MG/2ML IJ SOLN
INTRAMUSCULAR | Status: AC
  Filled 2018-04-08: qty 2

## 2018-04-08 MED ORDER — ONDANSETRON HCL 4 MG/2ML IJ SOLN
INTRAMUSCULAR | Status: AC
  Filled 2018-04-08: qty 2

## 2018-04-08 MED ORDER — CELECOXIB 200 MG PO CAPS
200.0000 mg | ORAL_CAPSULE | ORAL | Status: AC
  Administered 2018-04-08: 200 mg via ORAL

## 2018-04-08 MED ORDER — LIDOCAINE HCL (PF) 2 % IJ SOLN
INTRAMUSCULAR | Status: AC
  Filled 2018-04-08: qty 10

## 2018-04-08 MED ORDER — EPINEPHRINE PF 1 MG/ML IJ SOLN
INTRAMUSCULAR | Status: AC
  Filled 2018-04-08: qty 1

## 2018-04-08 MED ORDER — SENNA 8.6 MG PO TABS
1.0000 | ORAL_TABLET | Freq: Two times a day (BID) | ORAL | Status: DC
  Administered 2018-04-08 – 2018-04-09 (×2): 8.6 mg via ORAL
  Filled 2018-04-08 (×2): qty 1

## 2018-04-08 MED ORDER — LIDOCAINE-EPINEPHRINE (PF) 1 %-1:200000 IJ SOLN
INTRAMUSCULAR | Status: AC
  Filled 2018-04-08: qty 30

## 2018-04-08 MED ORDER — FENTANYL CITRATE (PF) 100 MCG/2ML IJ SOLN
25.0000 ug | INTRAMUSCULAR | Status: AC | PRN
  Administered 2018-04-08: 50 ug via INTRAVENOUS
  Administered 2018-04-08 (×4): 25 ug via INTRAVENOUS
  Administered 2018-04-08: 50 ug via INTRAVENOUS

## 2018-04-08 MED ORDER — FENTANYL CITRATE (PF) 100 MCG/2ML IJ SOLN
INTRAMUSCULAR | Status: AC
  Filled 2018-04-08: qty 2

## 2018-04-08 MED ORDER — DIPHENHYDRAMINE HCL 12.5 MG/5ML PO ELIX
12.5000 mg | ORAL_SOLUTION | Freq: Four times a day (QID) | ORAL | Status: DC | PRN
  Filled 2018-04-08: qty 5

## 2018-04-08 MED ORDER — HYDROMORPHONE HCL 1 MG/ML IJ SOLN
INTRAMUSCULAR | Status: AC
  Administered 2018-04-08: 0.5 mg via INTRAVENOUS
  Filled 2018-04-08: qty 1

## 2018-04-08 MED ORDER — GENTAMICIN SULFATE 40 MG/ML IJ SOLN
INTRAMUSCULAR | Status: AC
  Filled 2018-04-08: qty 2

## 2018-04-08 MED ORDER — FENTANYL CITRATE (PF) 100 MCG/2ML IJ SOLN
INTRAMUSCULAR | Status: DC | PRN
  Administered 2018-04-08: 50 ug via INTRAVENOUS
  Administered 2018-04-08: 25 ug via INTRAVENOUS
  Administered 2018-04-08: 50 ug via INTRAVENOUS
  Administered 2018-04-08 (×2): 25 ug via INTRAVENOUS

## 2018-04-08 MED ORDER — MIDAZOLAM HCL 2 MG/2ML IJ SOLN
INTRAMUSCULAR | Status: DC | PRN
  Administered 2018-04-08: 2 mg via INTRAVENOUS

## 2018-04-08 MED ORDER — OXYCODONE HCL 5 MG PO TABS: 5.0000 mg | ORAL_TABLET | Freq: Once | ORAL | Status: DC | PRN

## 2018-04-08 MED ORDER — SODIUM CHLORIDE 0.9 % IV SOLN
INTRAVENOUS | Status: DC | PRN
  Administered 2018-04-08: 250 mL via INTRAVENOUS

## 2018-04-08 MED ORDER — HYDROMORPHONE HCL 1 MG/ML IJ SOLN
1.0000 mg | INTRAMUSCULAR | Status: DC | PRN
  Administered 2018-04-08: 1 mg via INTRAVENOUS
  Filled 2018-04-08: qty 1

## 2018-04-08 MED ORDER — ONDANSETRON HCL 4 MG/2ML IJ SOLN
INTRAMUSCULAR | Status: DC | PRN
  Administered 2018-04-08: 4 mg via INTRAVENOUS

## 2018-04-08 MED ORDER — BUPIVACAINE-EPINEPHRINE (PF) 0.25% -1:200000 IJ SOLN
INTRAMUSCULAR | Status: AC
  Filled 2018-04-08: qty 30

## 2018-04-08 MED ORDER — GABAPENTIN 300 MG PO CAPS
300.0000 mg | ORAL_CAPSULE | ORAL | Status: AC
  Administered 2018-04-08: 300 mg via ORAL

## 2018-04-08 MED ORDER — HYDROCODONE-ACETAMINOPHEN 5-325 MG PO TABS
1.0000 | ORAL_TABLET | ORAL | Status: DC | PRN
  Administered 2018-04-09: 04:00:00 2 via ORAL
  Filled 2018-04-08: qty 2

## 2018-04-08 MED ORDER — ONDANSETRON HCL 4 MG/2ML IJ SOLN: 4.0000 mg | Freq: Four times a day (QID) | INTRAMUSCULAR | Status: DC | PRN

## 2018-04-08 MED ORDER — POLYETHYLENE GLYCOL 3350 17 G PO PACK: 17.0000 g | PACK | Freq: Every day | ORAL | Status: DC | PRN

## 2018-04-08 MED ORDER — ISOSULFAN BLUE 1 % ~~LOC~~ SOLN
SUBCUTANEOUS | Status: AC
  Filled 2018-04-08: qty 5

## 2018-04-08 MED ORDER — CELECOXIB 200 MG PO CAPS
ORAL_CAPSULE | ORAL | Status: AC
  Administered 2018-04-08: 200 mg via ORAL
  Filled 2018-04-08: qty 1

## 2018-04-08 MED ORDER — PROPOFOL 10 MG/ML IV BOLUS
INTRAVENOUS | Status: DC | PRN
  Administered 2018-04-08 (×3): 50 mg via INTRAVENOUS
  Administered 2018-04-08: 150 mg via INTRAVENOUS

## 2018-04-08 MED ORDER — LIDOCAINE HCL (CARDIAC) PF 100 MG/5ML IV SOSY
PREFILLED_SYRINGE | INTRAVENOUS | Status: DC | PRN
  Administered 2018-04-08: 100 mg via INTRAVENOUS

## 2018-04-08 MED ORDER — PROPOFOL 10 MG/ML IV BOLUS
INTRAVENOUS | Status: AC
  Filled 2018-04-08: qty 20

## 2018-04-08 MED ORDER — DEXAMETHASONE SODIUM PHOSPHATE 10 MG/ML IJ SOLN
INTRAMUSCULAR | Status: AC
  Filled 2018-04-08: qty 1

## 2018-04-08 MED ORDER — CEFAZOLIN SODIUM-DEXTROSE 2-4 GM/100ML-% IV SOLN: 2.0000 g | INTRAVENOUS | Status: DC

## 2018-04-08 MED ORDER — METHYLENE BLUE 0.5 % INJ SOLN
INTRAVENOUS | Status: AC
  Filled 2018-04-08: qty 10

## 2018-04-08 MED ORDER — EPHEDRINE SULFATE 50 MG/ML IJ SOLN
INTRAMUSCULAR | Status: DC | PRN
  Administered 2018-04-08: 5 mg via INTRAVENOUS
  Administered 2018-04-08 (×2): 10 mg via INTRAVENOUS
  Administered 2018-04-08: 5 mg via INTRAVENOUS
  Administered 2018-04-08 (×10): 10 mg via INTRAVENOUS

## 2018-04-08 MED ORDER — LIDOCAINE-EPINEPHRINE 1 %-1:100000 IJ SOLN
INTRAMUSCULAR | Status: DC | PRN
  Administered 2018-04-08: 20 mL

## 2018-04-08 MED ORDER — GLYCOPYRROLATE 0.2 MG/ML IJ SOLN
INTRAMUSCULAR | Status: DC | PRN
  Administered 2018-04-08: 0.2 mg via INTRAVENOUS

## 2018-04-08 MED ORDER — ISOSULFAN BLUE 1 % ~~LOC~~ SOLN
SUBCUTANEOUS | Status: DC | PRN
  Administered 2018-04-08: 2 mL via SUBCUTANEOUS

## 2018-04-08 SURGICAL SUPPLY — 90 items
APPLIER CLIP 9.375 SM OPEN (CLIP) ×2
BAG DECANTER FOR FLEXI CONT (MISCELLANEOUS) ×4 IMPLANT
BINDER BREAST LRG (GAUZE/BANDAGES/DRESSINGS) IMPLANT
BINDER BREAST MEDIUM (GAUZE/BANDAGES/DRESSINGS) IMPLANT
BINDER BREAST XLRG (GAUZE/BANDAGES/DRESSINGS) ×2 IMPLANT
BINDER BREAST XXLRG (GAUZE/BANDAGES/DRESSINGS) IMPLANT
BIOPATCH WHT 1IN DISK W/4.0 H (GAUZE/BANDAGES/DRESSINGS) ×4 IMPLANT
BLADE BOVIE TIP EXT 4 (BLADE) ×2 IMPLANT
BLADE SURG 15 STRL LF DISP TIS (BLADE) ×2 IMPLANT
BLADE SURG 15 STRL SS (BLADE) ×2
BNDG GAUZE 4.5X4.1 6PLY STRL (MISCELLANEOUS) ×4 IMPLANT
BULB RESERV EVAC DRAIN JP 100C (MISCELLANEOUS) ×4 IMPLANT
CANISTER SUCT 1200ML W/VALVE (MISCELLANEOUS) ×2 IMPLANT
CHLORAPREP W/TINT 26 (MISCELLANEOUS) ×2 IMPLANT
CLIP APPLIE 9.375 SM OPEN (CLIP) ×1 IMPLANT
CNTNR SPEC 2.5X3XGRAD LEK (MISCELLANEOUS) ×4
CONT SPEC 4OZ STER OR WHT (MISCELLANEOUS) ×4
CONTAINER SPEC 2.5X3XGRAD LEK (MISCELLANEOUS) ×4 IMPLANT
COVER LIGHT HANDLE STERIS (MISCELLANEOUS) ×2 IMPLANT
COVER WAND RF STERILE (DRAPES) IMPLANT
DECANTER SPIKE VIAL GLASS SM (MISCELLANEOUS) ×2 IMPLANT
DERMABOND ADVANCED (GAUZE/BANDAGES/DRESSINGS) ×2
DERMABOND ADVANCED .7 DNX12 (GAUZE/BANDAGES/DRESSINGS) ×2 IMPLANT
DRAIN CHANNEL 19F RND (DRAIN) ×4 IMPLANT
DRAIN CHANNEL JP 15F RND 16 (MISCELLANEOUS) IMPLANT
DRAPE LAPAROTOMY TRNSV 106X77 (MISCELLANEOUS) ×2 IMPLANT
ELECT BLADE 6.5 EXT (BLADE) ×2 IMPLANT
ELECT CAUTERY BLADE 6.4 (BLADE) ×2 IMPLANT
ELECT CAUTERY BLADE TIP 2.5 (TIP) ×2
ELECT REM PT RETURN 9FT ADLT (ELECTROSURGICAL) ×2
ELECTRODE CAUTERY BLDE TIP 2.5 (TIP) ×1 IMPLANT
ELECTRODE REM PT RTRN 9FT ADLT (ELECTROSURGICAL) ×1 IMPLANT
GAUZE SPONGE 4X4 12PLY STRL (GAUZE/BANDAGES/DRESSINGS) ×2 IMPLANT
GLOVE BIO SURGEON STRL SZ 6.5 (GLOVE) ×10 IMPLANT
GLOVE BIO SURGEON STRL SZ7 (GLOVE) ×2 IMPLANT
GLOVE BIOGEL PI IND STRL 6.5 (GLOVE) ×1 IMPLANT
GLOVE BIOGEL PI IND STRL 7.5 (GLOVE) ×1 IMPLANT
GLOVE BIOGEL PI INDICATOR 6.5 (GLOVE) ×1
GLOVE BIOGEL PI INDICATOR 7.5 (GLOVE) ×1
GLOVE SURG SYN 6.5 ES PF (GLOVE) ×2 IMPLANT
GOWN STRL REUS W/ TWL LRG LVL3 (GOWN DISPOSABLE) ×6 IMPLANT
GOWN STRL REUS W/TWL LRG LVL3 (GOWN DISPOSABLE) ×6
GRAFT FLEX HD 6X16 PLIABLE (Tissue) ×2 IMPLANT
IMPL EXPANDER BREAST 535CC (Breast) ×1 IMPLANT
IMPLANT BREAST 535CC (Breast) ×1 IMPLANT
IMPLANT EXPANDER BREAST 535CC (Breast) ×1 IMPLANT
IV NS 1000ML (IV SOLUTION) ×1
IV NS 1000ML BAXH (IV SOLUTION) ×1 IMPLANT
IV NS 500ML (IV SOLUTION) ×1
IV NS 500ML BAXH (IV SOLUTION) ×1 IMPLANT
KIT BLADEGUARD II DBL (SET/KITS/TRAYS/PACK) ×2 IMPLANT
KIT TURNOVER KIT A (KITS) ×2 IMPLANT
LABEL OR SOLS (LABEL) ×2 IMPLANT
MARKER SKIN DUAL TIP RULER LAB (MISCELLANEOUS) ×2 IMPLANT
NEEDLE 21 GA WING INFUSION (NEEDLE) ×2 IMPLANT
NEEDLE FILTER BLUNT 18X 1/2SAF (NEEDLE) ×1
NEEDLE FILTER BLUNT 18X1 1/2 (NEEDLE) ×1 IMPLANT
NEEDLE HYPO 22GX1.5 SAFETY (NEEDLE) ×4 IMPLANT
NEEDLE HYPO 25X1 1.5 SAFETY (NEEDLE) IMPLANT
PACK BASIN MAJOR ARMC (MISCELLANEOUS) ×2 IMPLANT
PACK BASIN MINOR ARMC (MISCELLANEOUS) ×2 IMPLANT
PACK UNIVERSAL (MISCELLANEOUS) ×2 IMPLANT
PAD ABD DERMACEA PRESS 5X9 (GAUZE/BANDAGES/DRESSINGS) ×4 IMPLANT
PENCIL ELECTRO HAND CTR (MISCELLANEOUS) ×2 IMPLANT
PIN SAFETY STRL (MISCELLANEOUS) ×2 IMPLANT
SET ASEPTIC TRANSFER (MISCELLANEOUS) ×4 IMPLANT
SLEVE PROBE SENORX GAMMA FIND (MISCELLANEOUS) IMPLANT
SPONGE LAP 18X18 RF (DISPOSABLE) ×6 IMPLANT
SUT ETH BLK MONO 3 0 FS 1 12/B (SUTURE) ×2 IMPLANT
SUT MNCRL 3-0 UNDYED SH (SUTURE) ×2 IMPLANT
SUT MNCRL 4-0 (SUTURE)
SUT MNCRL 4-0 27XMFL (SUTURE)
SUT MNCRL+ 5-0 UNDYED PC-3 (SUTURE) ×1 IMPLANT
SUT MONOCRYL 3-0 UNDYED (SUTURE) ×2
SUT MONOCRYL 5-0 (SUTURE) ×1
SUT PDS AB 1 CT1 27 (SUTURE) IMPLANT
SUT PDS PLUS 2 (SUTURE) ×3
SUT PDS PLUS AB 2-0 CT-1 (SUTURE) ×3 IMPLANT
SUT SILK 3 0 (SUTURE) ×1
SUT SILK 3-0 18XBRD TIE 12 (SUTURE) ×1 IMPLANT
SUT SILK 4 0 SH (SUTURE) ×2 IMPLANT
SUT VIC AB 2-0 CT1 (SUTURE) ×8 IMPLANT
SUT VIC AB 3-0 SH 27 (SUTURE) ×1
SUT VIC AB 3-0 SH 27X BRD (SUTURE) ×1 IMPLANT
SUTURE MNCRL 4-0 27XMF (SUTURE) IMPLANT
SYR 10ML LL (SYRINGE) ×2 IMPLANT
SYR BULB IRRIG 60ML STRL (SYRINGE) ×2 IMPLANT
TAPE TRANSPORE STRL 2 31045 (GAUZE/BANDAGES/DRESSINGS) ×2 IMPLANT
TOWEL OR 17X26 4PK STRL BLUE (TOWEL DISPOSABLE) ×2 IMPLANT
TUBING CONNECTING 10 (TUBING) ×2 IMPLANT

## 2018-04-08 NOTE — Op Note (Signed)
Operative Note  Preoperative Diagnosis: Left breast cancer  Postoperative Diagnosis: Same  Operation: Left nipple sparing mastectomy and sentinel lymph node biopsy  Immediate reconstruction with tissue expander (performed by Dr. Audelia Hives of plastic surgery--see separate operative note for details)  Surgeon: Fredirick Maudlin, MD  Assistant: Caroleen Hamman, MD (a second surgeon was necessary for exposure, as well as for the sentinel node procedure, as I am not currently privileged to perform this operation)  Anesthesia: General  Findings: Sentinel node procedure identified 2 sentinel nodes.  The first was hot and blue, the second was hot and not blue.  Indications: Left breast cancer  Procedure In Detail: The patient first went to nuclear medicine where she was injected with technetium sulfur colloid.  She was then brought to the preoperative holding area.  She was identified and her consent was confirmed.  She was appropriately marked.  She was then taken to the operating room and placed supine on the OR table.  Bony prominences were padded and bilateral sequential compression devices were placed on the lower extremities.  General anesthesia was induced with laryngeal mask airway.  She was then positioned appropriately for the operation.  5 cc of Lymphazurin blue were injected in the circum-areolar area.  The breast was massaged for 5 minutes.  She was then sterilely prepped and draped in standard fashion.  A timeout was performed confirming the patient's identity, the procedure being performed, her allergies, all necessary equipment was available, and that the maintenance anesthetic was adequate.  An incision line suitable for the plastic surgery reconstruction had been drawn by Dr. Marla Roe.  This area was infiltrated with quarter percent bupivacaine with epinephrine.  An incision was then made along this line.  This was carried down through the subcutaneum.  Skin hooks were utilized to  elevate the skin.  Flaps were elevated taking care to maintain sufficient thickness for good blood flow, yet making them thin enough for good oncologic outcome.  The flaps were elevated circumferentially and carried down to the pectoralis fascia.  The extent of the dissection extended to the clavicle superiorly, to the sternum medially, and to the inframammary fold inferiorly.  As we dissected towards the tail of the breast, we performed a sentinel node biopsy.  Gamma probe was utilized to identify the area of highest activity.  We dissected further into the axillary tissue until we identified a blue lymph node.  It was carefully dissected away from the surrounding tissues and excised.  It was also hot with counts in at 5220.  The gamma probe was again utilized and we found a second area of increased activity.  Additional nodal tissue was identified.  This was not blue but it was dissected free from the surrounding tissues and excised. Count was 11,184.  Once again, the gamma probe was introduced into the axilla, but no areas of activity greater than 1000 (10% of highest count) were appreciated.  We then dissected to the mass of breast tissue off of the pectoralis fascia.  It was completely excised.  We placed marking stitches with a short stitch in the superior position, a long stitch in the lateral position and a double stitch at the deep margin.  The wound bed was irrigated and found to be hemostatic.  At this point, the case was handed over to Dr. Marla Roe for placement of the tissue expander for reconstruction.  She will dictate her portion of the case.  EBL: See anesthesia record  IVF: See anesthesia record  Specimen(s): Sentinel lymph node #1: Hot and blue with counts of 5220; sentinel lymph node #2: Hot not blue with counts of 11,184: Left breast   Complications: none immediately apparent.   Counts: all needles, instruments, and sponges were counted and reported to be correct in number at the end  of my portion of the case.   I was present for and participated in the entire operation.  Fredirick Maudlin 2:23 PM

## 2018-04-08 NOTE — Anesthesia Post-op Follow-up Note (Signed)
Anesthesia QCDR form completed.        

## 2018-04-08 NOTE — Discharge Instructions (Addendum)
INSTRUCTIONS FOR AFTER BREAST SURGERY   The following information will help you and your family understand what to expect when you are discharged from the hospital.  Following these guidelines will help ensure a smooth recovery and reduce risks of complications.   Postoperative instructions include information on: diet, wound care, medications and physical activity.  AFTER SURGERY You will spend one night in the hospital for observation.  DIET Breast surgery does not require a specific diet.  However, I have to mention that the healthier you eat the better your body can start healing. It is important to increasing your protein intake.  This means limiting the foods with sugar and carbohydrates.  Focus on vegetables and some meat.  If you have any liposuction during your procedure be sure to drink water.  If your urine is bright yellow, then it is concentrated, and you need to drink more water.  As a general rule after surgery, you should have 8 ounces of water every hour while awake.  If you find you are persistently nauseated or unable to take in liquids let us know.  NO TOBACCO USE or EXPOSURE.  This will slow your healing process and increase the risk of a wound.  WOUND CARE You will have a drain for two weeks. You can shower once the drain has been removed.  Use fragrance free soap.  Dial, [place] and [place] are usually mild on the skin. If you have a drain clean with baby wipes until the drain is removed.  If you have steri-strips / tape directly attached to your skin leave them in place. It is OK to get these wet.  No baths, pools or hot tubs for two weeks. We close your incision to leave the smallest and best-looking scar. No ointment or creams on your incisions until given the go ahead.  Especially not Neosporin (Too many skin reactions with this one).  A few weeks after surgery you can use Mederma and start massaging the scar. We ask you to wear your binder or sports bra for the first 6 weeks  around the clock, including while sleeping. This provides added comfort and helps reduce the fluid accumulation at the surgery site.  ACTIVITY No heavy lifting until cleared by the doctor.  This usually means no more than a half-gallon of milk.  It is OK to walk and climb stairs. In fact, moving your legs is very important to decrease your risk of a blood clot.  It will also help keep you from getting deconditioned.  Every 1 to 2 hours get up and walk for 5 minutes. This will help with a quicker recovery back to normal.  Let pain be your guide so you don't do too much.  NO, you cannot do the spring cleaning and don't plan on taking care of anyone else.  This is your time for TLC.  You will be more comfortable if you sleep and rest with your head elevated either with a few pillows under you or in a recliner.  No stomach sleeping for a few months.  WORK Everyone returns to work at different times. As a rough guide, most people take at least 1 - 2 weeks off prior to returning to work. If you need documentation for your job, bring the forms to your postoperative follow up visit.  DRIVING Arrange for someone to bring you home from the hospital.  You may be able to drive a few days after surgery but not while taking any narcotics  or valium.  BOWEL MOVEMENTS Constipation can occur after anesthesia and while taking pain medication.  It is important to stay ahead for your comfort.  We recommend taking Milk of Magnesia (2 tablespoons; twice a day) while taking the pain pills.  SEROMA This is fluid your body tried to put in the surgical site.  This is normal but if it creates tight skinny skin let us know.  It usually decreases in a few weeks.  WHEN TO CALL Call your surgeon's office if any of the following occur:  Fever 101 degrees F or greater  Excessive bleeding or fluid from the incision site.  Pain that increases over time without aid from the medications  Redness, warmth, or pus draining from  incision sites  Persistent nausea or inability to take in liquids  Severe misshapen area that underwent the operation.  Here are some resources:  1. Plastic surgery website: https://www.plasticsurgery.org/for-medical-professionals/education-and-resources/publications/breast-reconstruction-magazine 2. Breast Reconstruction Awareness Campaign:  [employer]co[employer]nz 3. Plastic surgery Implant information:  https://www.plasticsurgery.org/patient-safety/breast-implant-safety
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
## 2018-04-08 NOTE — Op Note (Signed)
Op report    DATE OF OPERATION:  04/08/2018  LOCATION: [facility] Main Operating Room  SURGICAL DIVISION: Plastic Surgery  PREOPERATIVE DIAGNOSES:  1. Left Breast cancer.    POSTOPERATIVE DIAGNOSES:  1.Left  Breast cancer.   PROCEDURE:  1. Left immediate breast reconstruction with placement of Acellular Dermal Matrix and tissue expanders.  SURGEON: Claire Sanger Dillingham, DO  ANESTHESIA:  General.   COMPLICATIONS: None.   IMPLANTS: Left - Mentor 535 cc. Ref #[id number], 250 cc of injectable saline placed in the expander. Acellular Dermal Matrix 6 x 16 cm Flex HD  INDICATIONS FOR PROCEDURE:  The patient, Kelli Logan, is a 58 y.o. female born on 1960/02/22, is here for  immediate first stage breast reconstruction with placement of left tissue expander and Acellular dermal matrix. MRN: [id number]  CONSENT:  Informed consent was obtained directly from the patient. Risks, benefits and alternatives were fully discussed. Specific risks including but not limited to bleeding, infection, hematoma, seroma, scarring, pain, implant infection, implant extrusion, capsular contracture, asymmetry, wound healing problems, and need for further surgery were all discussed. The patient did have an ample opportunity to have her questions answered to her satisfaction.   DESCRIPTION OF PROCEDURE:  The patient was taken to the operating room by the general surgery team. SCDs were placed and IV antibiotics were given. The patient's chest was prepped and draped in a sterile fashion. A time out was performed and the implants to be used were identified.  Left mastectomy was performed.  Once the general surgery team had completed their portion of the case the patient was rendered to the plastic and reconstructive surgery team.  Left:  The pectoralis major muscle was lifted from the chest wall with release of the lateral edge and lateral inframammary fold.  The pocket was irrigated with  antibiotic solution and hemostasis was achieved with electrocautery.  The ADM was then prepared according to the manufacture guidelines and slits placed to help with postoperative fluid management.  The ADM was then sutured to the inferior and lateral edge of the inframammary fold with 2-0 PDS starting with an interrupted stitch and then a running stitch.  The lateral portion was sutured to with interrupted sutures after the expander was placed.  The expander was prepared according to the manufacture guidelines, the air evacuated and then it was placed under the ADM and pectoralis major muscle.  The inferior and lateral tabs were used to secure the expander to the chest wall with 2-0 PDS.  The drain was placed at the inframammary fold over the ADM and secured to the skin with 3-0 Silk.    The deep layers were closed with 3-0 Monocryl followed by 4-0 Monocryl.  The skin was closed with 5-0 Monocryl and then dermabond was applied.  The ABDs and breast binder were placed.  The patient tolerated the procedure well and there were no complications.  The patient was allowed to wake from anesthesia and taken to the recovery room in satisfactory condition.

## 2018-04-08 NOTE — Anesthesia Preprocedure Evaluation (Signed)
Anesthesia Evaluation  Patient identified by MRN, date of birth, ID band Patient awake    Reviewed: Allergy & Precautions, H&P , NPO status , Patient's Chart, lab work & pertinent test results  History of Anesthesia Complications Negative for: history of anesthetic complications  Airway Mallampati: III  TM Distance: <3 FB Neck ROM: full    Dental  (+) Chipped, Poor Dentition   Pulmonary neg shortness of breath, asthma , former smoker,           Cardiovascular Exercise Tolerance: Good hypertension, (-) angina(-) Past MI and (-) DOE      Neuro/Psych PSYCHIATRIC DISORDERS  Neuromuscular disease negative psych ROS   GI/Hepatic negative GI ROS, Neg liver ROS, neg GERD  ,  Endo/Other  negative endocrine ROS  Renal/GU      Musculoskeletal  (+) Arthritis , Fibromyalgia -  Abdominal   Peds  Hematology negative hematology ROS (+)   Anesthesia Other Findings Past Medical History: No date: Anxiety No date: Arthritis No date: Cancer ([place]) No date: Depression No date: Fibromyalgia No date: History of kidney stones     Comment:  h/o No date: Hypertension No date: Thyroid disease  Past Surgical History: No date: APPENDECTOMY few yrs ago: BREAST BIOPSY; Left     Comment:  stereotactic bx in [place], benign 12/26/2017: BREAST BIOPSY; Left     Comment:  Affirm Bx IMC- X-Clip 01/14/2018: BREAST BIOPSY; Left     Comment:  Affirm Bx- Coil clip- path pending No date: FOOT BONE EXCISION 2440 and 1027: UMBILICAL HERNIA REPAIR  BMI    Body Mass Index:  30.28 kg/m      Reproductive/Obstetrics negative OB ROS                             Anesthesia Physical Anesthesia Plan  ASA: III  Anesthesia Plan: General LMA   Post-op Pain Management:    Induction: Intravenous  PONV Risk Score and Plan: Dexamethasone, Ondansetron, Midazolam and Treatment may vary due to age or medical condition  Airway  Management Planned: LMA  Additional Equipment:   Intra-op Plan:   Post-operative Plan: Extubation in OR  Informed Consent: I have reviewed the patients History and Physical, chart, labs and discussed the procedure including the risks, benefits and alternatives for the proposed anesthesia with the patient or authorized representative who has indicated his/her understanding and acceptance.     Dental Advisory Given  Plan Discussed with: Anesthesiologist, CRNA and Surgeon  Anesthesia Plan Comments: (Patient consented for risks of anesthesia including but not limited to:  - adverse reactions to medications - damage to teeth, lips or other oral mucosa - sore throat or hoarseness - Damage to heart, brain, lungs or loss of life  Patient voiced understanding.)        Anesthesia Quick Evaluation

## 2018-04-08 NOTE — H&P (Signed)
Patient ID: Kelli Logan, female   DOB: 1960/03/09, 58 y.o.   MRN: [id number]  History of Present Illness Kelli Logan is a 58 y.o. female with left breast cancer.  She has undergone multiple biopsies and imaging studies and is here for mastectomy with SLNBx and immediate reconstruction (tissue expander).  Past Medical History Past Medical History:  Diagnosis Date  . Anxiety   . Arthritis   . Cancer ([place])   . Depression   . Fibromyalgia   . History of kidney stones    h/o  . Hypertension   . Thyroid disease        Past Surgical History:  Procedure Laterality Date  . APPENDECTOMY    . BREAST BIOPSY Left few yrs ago   stereotactic bx in [place], benign  . BREAST BIOPSY Left 12/26/2017   Affirm Bx IMC- X-Clip  . BREAST BIOPSY Left 01/14/2018   Affirm Bx- Coil clip- path pending  . FOOT BONE EXCISION    . UMBILICAL HERNIA REPAIR  1983 and 1985    No Known Allergies  Current Facility-Administered Medications  Medication Dose Route Frequency Provider Last Rate Last Dose  . ceFAZolin (ANCEF) 2-4 GM/100ML-% IVPB           . ceFAZolin (ANCEF) IVPB 2g/100 mL premix  2 g Intravenous On Call to OR Dillingham, Loel Lofty, DO      . ceFAZolin (ANCEF) IVPB 2g/100 mL premix  2 g Intravenous On Call to OR Fredirick Maudlin, MD      . Chlorhexidine Gluconate Cloth 2 % PADS 6 each  6 each Topical Once Dillingham, Loel Lofty, DO       And  . Chlorhexidine Gluconate Cloth 2 % PADS 6 each  6 each Topical Once Dillingham, Loel Lofty, DO      . Chlorhexidine Gluconate Cloth 2 % PADS 6 each  6 each Topical Once Fredirick Maudlin, MD       And  . Chlorhexidine Gluconate Cloth 2 % PADS 6 each  6 each Topical Once Fredirick Maudlin, MD      . lactated ringers infusion   Intravenous Continuous Durenda Hurt, MD 50 mL/hr at 04/08/18 0909      Family History Family History  Problem Relation Age of Onset  . Hypertension Mother   . Lung cancer Maternal Grandmother   . Heart disease Father   .  Alcohol abuse Father   . Breast cancer Neg Hx   . Colon cancer Neg Hx        Social History Social History   Tobacco Use  . Smoking status: Former Smoker    Packs/day: 1.00    Years: 2.00    Pack years: 2.00    Types: Cigarettes    Last attempt to quit: 01/06/2017    Years since quitting: 1.2  . Smokeless tobacco: Never Used  Substance Use Topics  . Alcohol use: Yes    Comment: rare beer  . Drug use: Not Currently        ROS A complete ROS of systems was performed and is otherwise negative other than what is stated in the HPI  Physical Exam Blood pressure 139/85, pulse (!) 58, temperature 97.7 F (36.5 C), temperature source Tympanic, resp. rate 18, height 5\' 6"  (1.676 m), weight 85.1 kg, SpO2 98 %.  CONSTITUTIONAL: anxious, but NAD EYES: Pupils equal, round, and reactive to light, Sclera non-icteric. EARS, NOSE, MOUTH AND THROAT: The oropharynx is clear. Oral mucosa is pink and moist. Hearing  is intact to voice.  NECK: Trachea is midline, and there is no jugular venous distension. Thyroid is without palpable abnormalities. LYMPH NODES:  Lymph nodes in the neck are not enlarged. RESPIRATORY:  Lungs are clear, and breath sounds are equal bilaterally. Normal respiratory effort without pathologic use of accessory muscles. CARDIOVASCULAR: Heart is regular without murmurs, gallops, or rubs. GI: The abdomen is  soft, nontender, and nondistended. There were no palpable masses. There was no hepatosplenomegaly. There were normal bowel sounds. GU: deferred MUSCULOSKELETAL:  Normal muscle strength and tone in all four extremities.    SKIN: Skin turgor is normal. There are no pathologic skin lesions.  NEUROLOGIC:  Motor and sensation is grossly normal.  Cranial nerves are grossly intact. PSYCH:  Alert and oriented to person, place and time.   Data Reviewed Imaging and biopsies previously reviewed.  I have personally reviewed the patient's imaging and medical records.     Assessment    Left breast cancer; axilla is clinically negative.  Plan    OR today for mastectomy with SLNBx and tissue expander placement.  Fredirick Maudlin, MD, FACS  Fredirick Maudlin 04/08/2018, 11:35 AM

## 2018-04-08 NOTE — Transfer of Care (Signed)
Immediate Anesthesia Transfer of Care Note  Patient: Kelli Logan  Procedure(s) Performed: Procedure(s): MASTECTOMY WITH SENTINEL LYMPH NODE BIOPSY LEFT (Left) LEFT BREAST IMMEDIATE  RECONSTRUCTION WITH EXPANDER AND FLEX HD (Left)  Patient Location: PACU  Anesthesia Type:General  Level of Consciousness: sedated  Airway & Oxygen Therapy: Patient Spontanous Breathing and Patient connected to face mask oxygen  Post-op Assessment: Report given to RN and Post -op Vital signs reviewed and stable  Post vital signs: Reviewed and stable  Last Vitals:  Vitals:   04/08/18 0836 04/08/18 1549  BP: 139/85 117/77  Pulse: (!) 58 95  Resp: 18 11  Temp: 36.5 C 36.5 C  SpO2: 68% 12%    Complications: No apparent anesthesia complications

## 2018-04-08 NOTE — Anesthesia Procedure Notes (Signed)
Procedure Name: LMA Insertion Date/Time: 04/08/2018 11:55 AM Performed by: Doreen Salvage, CRNA Pre-anesthesia Checklist: Patient identified, Patient being monitored, Timeout performed, Emergency Drugs available and Suction available Patient Re-evaluated:Patient Re-evaluated prior to induction Oxygen Delivery Method: Circle system utilized Preoxygenation: Pre-oxygenation with 100% oxygen Induction Type: IV induction Ventilation: Mask ventilation without difficulty LMA: LMA inserted LMA Size: 3.5 Tube type: Oral Number of attempts: 1 Placement Confirmation: positive ETCO2 and breath sounds checked- equal and bilateral Tube secured with: Tape Dental Injury: Teeth and Oropharynx as per pre-operative assessment

## 2018-04-09 ENCOUNTER — Encounter: Payer: Self-pay | Admitting: General Surgery

## 2018-04-09 DIAGNOSIS — C50512 Malignant neoplasm of lower-outer quadrant of left female breast: Secondary | ICD-10-CM | POA: Diagnosis not present

## 2018-04-09 LAB — CBC
HCT: 34.6 % — ABNORMAL LOW (ref 36.0–46.0)
Hemoglobin: 11.9 g/dL — ABNORMAL LOW (ref 12.0–15.0)
MCH: 28.9 pg (ref 26.0–34.0)
MCHC: 34.4 g/dL (ref 30.0–36.0)
MCV: 84 fL (ref 80.0–100.0)
Platelets: 325 10*3/uL (ref 150–400)
RBC: 4.12 MIL/uL (ref 3.87–5.11)
RDW: 12.6 % (ref 11.5–15.5)
WBC: 17.5 10*3/uL — ABNORMAL HIGH (ref 4.0–10.5)
nRBC: 0 % (ref 0.0–0.2)

## 2018-04-09 LAB — BASIC METABOLIC PANEL
ANION GAP: 9 (ref 5–15)
BUN: 19 mg/dL (ref 6–20)
CO2: 24 mmol/L (ref 22–32)
Calcium: 9.1 mg/dL (ref 8.9–10.3)
Chloride: 98 mmol/L (ref 98–111)
Creatinine, Ser: 0.94 mg/dL (ref 0.44–1.00)
GFR calc Af Amer: >60 mL/min (ref >60)
GFR calc non Af Amer: >60 mL/min (ref >60)
GLUCOSE: 146 mg/dL — AB (ref 70–99)
POTASSIUM: 4.2 mmol/L (ref 3.5–5.1)
Sodium: 131 mmol/L — ABNORMAL LOW (ref 135–145)

## 2018-04-09 NOTE — Care Management Obs Status (Signed)
[facility] NOTIFICATION   Patient Details  Name: Kelli Logan MRN: [id number] Date of Birth: July 14, 1960   Medicare Observation Status Notification Given:  No(admitted obs less than 24 hours)    Beverly Sessions, RN 04/09/2018, 12:09 PM

## 2018-04-09 NOTE — Progress Notes (Signed)
Patient cleared for discharge.   Education complete. AVS printed. Discharge instructions given. Educated on how to empty and record JP drainage.  All questions answered for patient clarification.  Understands she is able to resume home meds.   IV removed. D/c to home via [place] ride.
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
## 2018-04-09 NOTE — Anesthesia Postprocedure Evaluation (Signed)
Anesthesia Post Note  Patient: Games developer  Procedure(s) Performed: MASTECTOMY WITH SENTINEL LYMPH NODE BIOPSY LEFT (Left ) LEFT BREAST IMMEDIATE  RECONSTRUCTION WITH EXPANDER AND FLEX HD (Left )  Patient location during evaluation: PACU Anesthesia Type: General Level of consciousness: awake and alert Pain management: pain level controlled Vital Signs Assessment: post-procedure vital signs reviewed and stable Respiratory status: spontaneous breathing, nonlabored ventilation, respiratory function stable and patient connected to nasal cannula oxygen Cardiovascular status: blood pressure returned to baseline and stable Postop Assessment: no apparent nausea or vomiting Anesthetic complications: no     Last Vitals:  Vitals:   04/08/18 2326 04/09/18 0402  BP: (!) 130/92 (!) 156/98  Pulse: (!) 112 (!) 105  Resp:  20  Temp: 36.8 C 37.1 C  SpO2: 98% 96%    Last Pain:  Vitals:   04/09/18 0519  TempSrc:   PainSc: [place]

## 2018-04-10 ENCOUNTER — Other Ambulatory Visit: Payer: Self-pay | Admitting: Psychiatry

## 2018-04-10 DIAGNOSIS — F4310 Post-traumatic stress disorder, unspecified: Secondary | ICD-10-CM

## 2018-04-10 LAB — HIV ANTIBODY (ROUTINE TESTING W REFLEX): HIV Screen 4th Generation wRfx: NONREACTIVE

## 2018-04-11 ENCOUNTER — Encounter: Payer: Self-pay | Admitting: *Deleted

## 2018-04-11 LAB — SURGICAL PATHOLOGY
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
## 2018-04-12 ENCOUNTER — Emergency Department
Admission: EM | Admit: 2018-04-12 | Discharge: 2018-04-13 | Disposition: A | Discharge status: Home / Self Care | Payer: Medicare Other | Attending: Emergency Medicine | Admitting: Emergency Medicine

## 2018-04-12 ENCOUNTER — Encounter: Payer: Self-pay | Admitting: Emergency Medicine

## 2018-04-12 DIAGNOSIS — Z87891 Personal history of nicotine dependence: Secondary | ICD-10-CM | POA: Diagnosis not present

## 2018-04-12 DIAGNOSIS — Z79899 Other long term (current) drug therapy: Secondary | ICD-10-CM | POA: Insufficient documentation

## 2018-04-12 DIAGNOSIS — K5900 Constipation, unspecified: Secondary | ICD-10-CM | POA: Diagnosis not present

## 2018-04-12 DIAGNOSIS — C50512 Malignant neoplasm of lower-outer quadrant of left female breast: Secondary | ICD-10-CM | POA: Insufficient documentation

## 2018-04-12 DIAGNOSIS — K625 Hemorrhage of anus and rectum: Secondary | ICD-10-CM | POA: Diagnosis present

## 2018-04-12 DIAGNOSIS — I10 Essential (primary) hypertension: Secondary | ICD-10-CM | POA: Insufficient documentation

## 2018-04-12 LAB — CBC WITH DIFFERENTIAL/PLATELET
Abs Immature Granulocytes: 0.06 10*3/uL (ref 0.00–0.07)
Basophils Absolute: 0.1 10*3/uL (ref 0.0–0.1)
Basophils Relative: 1 %
Eosinophils Absolute: 0.3 10*3/uL (ref 0.0–0.5)
Eosinophils Relative: 3 %
HCT: 34.6 % — ABNORMAL LOW (ref 36.0–46.0)
Hemoglobin: 11.6 g/dL — ABNORMAL LOW (ref 12.0–15.0)
Immature Granulocytes: 1 %
Lymphocytes Relative: 31 %
Lymphs Abs: 2.9 10*3/uL (ref 0.7–4.0)
MCH: 28.3 pg (ref 26.0–34.0)
MCHC: 33.5 g/dL (ref 30.0–36.0)
MCV: 84.4 fL (ref 80.0–100.0)
Monocytes Absolute: 1 10*3/uL (ref 0.1–1.0)
Monocytes Relative: 10 %
Neutro Abs: 5.2 10*3/uL (ref 1.7–7.7)
Neutrophils Relative %: 54 %
Platelets: 367 10*3/uL (ref 150–400)
RBC: 4.1 MIL/uL (ref 3.87–5.11)
RDW: 12.6 % (ref 11.5–15.5)
WBC: 9.5 10*3/uL (ref 4.0–10.5)
nRBC: 0 % (ref 0.0–0.2)

## 2018-04-12 LAB — BASIC METABOLIC PANEL
Anion gap: 8 (ref 5–15)
BUN: 14 mg/dL (ref 6–20)
CO2: 24 mmol/L (ref 22–32)
Calcium: 9.1 mg/dL (ref 8.9–10.3)
Chloride: 99 mmol/L (ref 98–111)
Creatinine, Ser: 0.8 mg/dL (ref 0.44–1.00)
GFR calc Af Amer: >60 mL/min (ref >60)
GFR calc non Af Amer: >60 mL/min (ref >60)
Glucose, Bld: 136 mg/dL — ABNORMAL HIGH (ref 70–99)
Potassium: 3.7 mmol/L (ref 3.5–5.1)
Sodium: 131 mmol/L — ABNORMAL LOW (ref 135–145)

## 2018-04-12 LAB — PROTIME-INR
INR: 0.9 (ref 0.8–1.2)
Prothrombin Time: 11.6 seconds (ref 11.4–15.2)

## 2018-04-12 LAB — TYPE AND SCREEN
ABO/RH(D): A NEG
Antibody Screen: NEGATIVE

## 2018-04-12 NOTE — ED Triage Notes (Signed)
Pt had mastectomy on Tuesday, sent home on pain medication. Constipation occurred, pt used 2 enemas today with the second enema causing rectal bleeding.

## 2018-04-13 ENCOUNTER — Emergency Department: Payer: Medicare Other

## 2018-04-13 MED ORDER — SORBITOL 70 % SOLN
960.0000 mL | TOPICAL_OIL | Freq: Once | ORAL | Status: AC
  Administered 2018-04-13: 960 mL via RECTAL
  Filled 2018-04-13: qty 473
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
## 2018-04-13 NOTE — ED Notes (Signed)
Pt successful with BM post enema

## 2018-04-13 NOTE — Discharge Instructions (Addendum)
Please continue to use your sodium docusate for your constipation.  Please return if you have any further problems.  If you not taking more than a few pain pills you should be okay with just the stool softener.

## 2018-04-14 ENCOUNTER — Telehealth: Payer: Self-pay | Admitting: Plastic Surgery

## 2018-04-14 NOTE — Telephone Encounter (Signed)
Called patient to confirm appointment scheduled for tomorrow. Patient answered the following questions: °1.Has the patient traveled outside of the state of [place] at all within the past 6 weeks? No °2.Does the patient have a fever or cough at all? No °3.Has the patient been tested for COVID? Had a positive COVID test? No °4. Has the patient been in contact with anyone who has tested positive? No ° °

## 2018-04-15 ENCOUNTER — Encounter: Payer: Self-pay | Admitting: Plastic Surgery

## 2018-04-15 ENCOUNTER — Other Ambulatory Visit: Payer: Self-pay

## 2018-04-15 ENCOUNTER — Ambulatory Visit (INDEPENDENT_AMBULATORY_CARE_PROVIDER_SITE_OTHER): Payer: Medicare Other | Admitting: Plastic Surgery

## 2018-04-15 VITALS — BP 126/79 | HR 74 | Temp 97.7°F | Ht 66.0 in | Wt 189.4 lb

## 2018-04-15 DIAGNOSIS — Z9012 Acquired absence of left breast and nipple: Secondary | ICD-10-CM

## 2018-04-15 DIAGNOSIS — Z170 Estrogen receptor positive status [ER+]: Secondary | ICD-10-CM

## 2018-04-15 DIAGNOSIS — Z9010 Acquired absence of unspecified breast and nipple: Secondary | ICD-10-CM | POA: Insufficient documentation

## 2018-04-15 DIAGNOSIS — C50512 Malignant neoplasm of lower-outer quadrant of left female breast: Secondary | ICD-10-CM

## 2018-04-15 NOTE — Progress Notes (Signed)
   Subjective:    Patient ID: Kelli Logan, female    DOB: 03-22-1960, 58 y.o.   MRN: [id number]  The patient is a 58 yrs old wf here for follow up on her left breast reconstruction.  She underwent the mastectomy last week and expander placement.  The drain output is as expected. No sign of infection.  No hematoma.  Incision healing well. Drain in place.   Review of Systems  Constitutional: Positive for activity change.  HENT: Negative.   Eyes: Negative.   Respiratory: Negative.   Cardiovascular: Negative.   Gastrointestinal: Negative.   Musculoskeletal: Negative.   Psychiatric/Behavioral: Negative.        Objective:   Physical Exam Vitals signs and nursing note reviewed.  Constitutional:      Appearance: Normal appearance.  HENT:     Head: Normocephalic and atraumatic.  Cardiovascular:     Rate and Rhythm: Normal rate.     Pulses: Normal pulses.  Pulmonary:     Effort: Pulmonary effort is normal.  Neurological:     General: No focal deficit present.     Mental Status: She is alert.  Psychiatric:        Mood and Affect: Mood normal.        Behavior: Behavior normal.        Assessment & Plan:  Malignant neoplasm of lower-outer quadrant of left breast of female, estrogen receptor positive ([facility])  Status post left mastectomy  We placed injectable saline in the Expander using a sterile technique: Left: 50 cc for a total of 300 / 535 cc  Plan for drain removal next week.

## 2018-04-17 ENCOUNTER — Other Ambulatory Visit: Payer: Self-pay

## 2018-04-18 ENCOUNTER — Encounter: Payer: Self-pay | Admitting: Internal Medicine

## 2018-04-18 ENCOUNTER — Inpatient Hospital Stay: Payer: Medicare Other | Attending: Internal Medicine | Admitting: Internal Medicine

## 2018-04-18 DIAGNOSIS — C50512 Malignant neoplasm of lower-outer quadrant of left female breast: Secondary | ICD-10-CM

## 2018-04-18 DIAGNOSIS — Z170 Estrogen receptor positive status [ER+]: Secondary | ICD-10-CM

## 2018-04-18 NOTE — Assessment & Plan Note (Addendum)
#   Stage I left breast IMC; pT1b [6 mm] sentinel lymph node negative ER-Pos; PR-neg; Her 2 neu status post mastectomy.  Reviewed the pathology in detail.  #Discussed regarding Oncotype at length.  Discussed if low risk would recommend antihormone therapy alone however if high risk would recommend  Chemotherapy.   #Discussed there is no role for radiation given the patient had mastectomy/lymph was negative.  #Pain from mastectomy/expanders stable.  # Genetic counseling was discussed/will refer after covid pandemic situation is under control.  # DISPOSITION: Order Oncotype #WebEx-2 to 3 weeks.

## 2018-04-18 NOTE — Progress Notes (Signed)
I connected with Kelli Logan on 04/18/18 at 10:00 AM EDT by video enabled telemedicine visit and verified that I am speaking with the correct person using two identifiers.  I discussed the limitations, risks, security and privacy concerns of performing an evaluation and management service by telemedicine and the availability of in-person appointments. I also discussed with the patient that there may be a patient responsible charge related to this service. The patient expressed understanding and agreed to proceed.    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 s/p mastectomy [Dr.Cannon/Dillingham]; Oncotype-Pending.   # anxiety/depression  DIAGNOSIS: Right Breast ca  STAGE:  I       ;GOALS: cure  CURRENT/MOST RECENT THERAPY: Oncotype       Malignant neoplasm of lower-outer quadrant of left breast of female, estrogen receptor positive (Kelli Logan)     Chief Complaint: breast cancer    History of present illness:Kelli Logan 58 y.o.  female with history of newly diagnosed breast cancer is currently status post mastectomy.  Patient recovery post surgery is uneventful.  Complains of pain from manipulation of the expanders.   Otherwise she is doing well.  Obviously anxious.  Observation/objective: Breast pathology-invasive mammary carcinoma [id number]  Assessment and plan: Malignant neoplasm of lower-outer quadrant of left breast of female, estrogen receptor positive (Kelli Logan) # Stage I left breast IMC; pT1b [6 mm] sentinel lymph node negative ER-Pos; PR-neg; Her 2 neu status post mastectomy.  Reviewed the pathology in detail.  #Discussed regarding Oncotype at length.  Discussed if low risk would recommend antihormone therapy  alone however if high risk would recommend  Chemotherapy.   #Discussed there is no role for radiation given the patient had mastectomy/lymph was negative.  #Pain from mastectomy/expanders stable.  # Genetic counseling was discussed/will refer after covid pandemic situation is under control.  # DISPOSITION: Order Oncotype #WebEx-2 to 3 weeks.    Follow-up instructions:  I discussed the assessment and treatment plan with the patient.  The patient was provided an opportunity to ask questions and all were answered.  The patient agreed with the plan and demonstrated understanding of instructions.  The patient was advised to call back or seek an in person evaluation if the symptoms worsen or if the condition fails to improve as anticipated.   Dr. Charlaine Dalton Bascom at [facility] 04/18/2018 10:29 AM

## 2018-04-21 ENCOUNTER — Ambulatory Visit (INDEPENDENT_AMBULATORY_CARE_PROVIDER_SITE_OTHER): Payer: Medicare Other | Admitting: Plastic Surgery

## 2018-04-21 ENCOUNTER — Other Ambulatory Visit: Payer: Self-pay

## 2018-04-21 ENCOUNTER — Ambulatory Visit: Payer: Medicare Other | Admitting: Plastic Surgery

## 2018-04-21 ENCOUNTER — Encounter: Payer: Self-pay | Admitting: Plastic Surgery

## 2018-04-21 VITALS — BP 135/70 | HR 85 | Temp 98.4°F | Ht 66.0 in | Wt 189.0 lb

## 2018-04-21 DIAGNOSIS — Z9012 Acquired absence of left breast and nipple: Secondary | ICD-10-CM

## 2018-04-21 NOTE — Progress Notes (Signed)
Pt is status post mastectomy & tissue expander placement- on 04/08/18 Incision is intact, no drainage, no redness- she reports minimal pain Drain is intact- with slight redness at insertion site- according to her records- she had the following amounts: 04/17/18= 31ml 04/18/18= 27ml 04/19/18= 49ml  04/20/18= 60ml 34ml noted in bulb today Drain was removed intact & vaseline,gauze & tape applied She is instructed to wait 2 days- then shower- & can use gauze/vaseline if needed at drain site She denies fever or other complications I instructed her to try to find a sports bra- she is currently wearing a nursing bra & surgical garment- with another bra on top. She will call for any concerns & she has f/u with Dr. Janalyn Shy in one week

## 2018-04-22 ENCOUNTER — Ambulatory Visit: Payer: Medicare Other | Admitting: Plastic Surgery

## 2018-04-24 ENCOUNTER — Encounter: Payer: Self-pay | Admitting: *Deleted

## 2018-04-24 NOTE — Progress Notes (Signed)
  Oncology Nurse Navigator Documentation  Navigator Location: [facility] (04/24/18 1500)   )Navigator Encounter Type: Telephone (04/24/18 1500) Telephone: Lahoma Crocker Call (04/24/18 1500)                       Barriers/Navigation Needs: Financial (04/24/18 1500)                          Time Spent with Patient: 60 (04/24/18 1500)   Patient called and stated she was encouraged to call to see if she could get assistance from [place] to help with a compression bra and temporary prosthesis.  Faxed approval to [employer]. Patient states she is experiencing pain at her surgical site and under her arm.  She was seen on the 13th by Dr. Marla Roe for drain removal post mastectomy and tissue expander placement.  She was encouraged to call Dr. Marla Roe in regards to her pain.  She is to call with any questions or needs.

## 2018-04-25 ENCOUNTER — Telehealth: Payer: Self-pay | Admitting: Plastic Surgery

## 2018-04-25 NOTE — Telephone Encounter (Signed)
Called patient to confirm appointment scheduled for tomorrow. Patient answered the following questions: °1.Has the patient traveled outside of the state of [place] at all within the past 6 weeks? No °2.Does the patient have a fever or cough at all? No °3.Has the patient been tested for COVID? Had a positive COVID test? No °4. Has the patient been in contact with anyone who has tested positive? No ° °

## 2018-04-28 ENCOUNTER — Ambulatory Visit (INDEPENDENT_AMBULATORY_CARE_PROVIDER_SITE_OTHER): Payer: Medicare Other | Admitting: Plastic Surgery

## 2018-04-28 ENCOUNTER — Other Ambulatory Visit: Payer: Self-pay

## 2018-04-28 ENCOUNTER — Encounter: Payer: Self-pay | Admitting: Plastic Surgery

## 2018-04-28 VITALS — BP 122/80 | HR 76 | Temp 98.0°F | Ht 66.0 in | Wt 189.0 lb

## 2018-04-28 DIAGNOSIS — Z9012 Acquired absence of left breast and nipple: Secondary | ICD-10-CM

## 2018-04-28 DIAGNOSIS — Z9010 Acquired absence of unspecified breast and nipple: Secondary | ICD-10-CM | POA: Insufficient documentation

## 2018-04-28 MED ORDER — DIAZEPAM 2 MG PO TABS: 2.0000 mg | ORAL_TABLET | Freq: Two times a day (BID) | ORAL | 0 refills | Status: DC | PRN

## 2018-04-28 NOTE — Progress Notes (Signed)
   Subjective:    Patient ID: Kelli Logan, female    DOB: [birth year], 59 y.o.   MRN: [id number]  The patient is a 58 year old female who underwent a left-sided mastectomy with reconstruction.  The expander is in place.  There is no sign of seroma or hematoma.  The skin flap is intact and appears to be surviving nicely.   Review of Systems  Constitutional: Negative.   HENT: Negative.   Eyes: Negative.   Respiratory: Negative.   Cardiovascular: Negative.   Gastrointestinal: Negative.   Musculoskeletal: Negative.        Objective:   Physical Exam Vitals signs and nursing note reviewed.  Constitutional:      Appearance: Normal appearance.  HENT:     Head: Normocephalic.  Pulmonary:     Effort: Pulmonary effort is normal.  Neurological:     Mental Status: She is alert and oriented to person, place, and time.  Psychiatric:        Mood and Affect: Mood normal.        Behavior: Behavior normal.       Assessment & Plan:  Status post left mastectomy  Acquired absence of left breast   We placed injectable saline in the Expander using a sterile technique: Left: 100 cc for a total of 400 / 535 cc Follow-up in 2 weeks.

## 2018-04-30 ENCOUNTER — Encounter: Payer: Self-pay | Admitting: Internal Medicine

## 2018-04-30 DIAGNOSIS — R432 Parageusia: Secondary | ICD-10-CM | POA: Insufficient documentation

## 2018-04-30 DIAGNOSIS — R519 Headache, unspecified: Secondary | ICD-10-CM | POA: Insufficient documentation

## 2018-05-01 ENCOUNTER — Ambulatory Visit (INDEPENDENT_AMBULATORY_CARE_PROVIDER_SITE_OTHER): Payer: Medicare Other | Admitting: Licensed Clinical Social Worker

## 2018-05-01 ENCOUNTER — Encounter: Payer: Self-pay | Admitting: Licensed Clinical Social Worker

## 2018-05-01 ENCOUNTER — Other Ambulatory Visit: Payer: Self-pay

## 2018-05-01 ENCOUNTER — Encounter: Payer: Self-pay | Admitting: *Deleted

## 2018-05-01 DIAGNOSIS — F4310 Post-traumatic stress disorder, unspecified: Secondary | ICD-10-CM | POA: Diagnosis not present
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
## 2018-05-03 ENCOUNTER — Other Ambulatory Visit: Payer: Self-pay | Admitting: Psychiatry

## 2018-05-03 DIAGNOSIS — F4310 Post-traumatic stress disorder, unspecified: Secondary | ICD-10-CM

## 2018-05-05 ENCOUNTER — Telehealth: Payer: Self-pay | Admitting: Plastic Surgery
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
## 2018-05-06 ENCOUNTER — Telehealth: Payer: Self-pay | Admitting: *Deleted

## 2018-05-06 NOTE — Telephone Encounter (Signed)
Dr. B your plan is to review the oncotype results correct with this video visit?

## 2018-05-06 NOTE — Telephone Encounter (Signed)
Patient called requesting call back from team, questioning what upcoming visit is for and also asking if it is a necessary visit if it can be a video visit.

## 2018-05-06 NOTE — Telephone Encounter (Signed)
Notified patient what the visit was for and that she will be doing the visit via WebEx.
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
## 2018-05-06 NOTE — Telephone Encounter (Signed)
Almyra Free- To discuss oncotype results. This needs to be VIDEO visit. Thanks, GB

## 2018-05-06 NOTE — Telephone Encounter (Signed)
Patient called back to follow up with call from [place], [place]. Patient stated that the dentist is planning extraction for 05/19/18. Patient is concerned if she will have to have antibiotics, but still asking for letter to be faxed to dentist stating that she is stable. [facility] Fax is [phone].

## 2018-05-09 ENCOUNTER — Other Ambulatory Visit: Payer: Self-pay

## 2018-05-09 ENCOUNTER — Encounter: Payer: Self-pay | Admitting: *Deleted

## 2018-05-09 ENCOUNTER — Inpatient Hospital Stay: Payer: Medicare Other | Attending: Internal Medicine | Admitting: Internal Medicine

## 2018-05-09 DIAGNOSIS — C50512 Malignant neoplasm of lower-outer quadrant of left female breast: Secondary | ICD-10-CM

## 2018-05-09 DIAGNOSIS — Z170 Estrogen receptor positive status [ER+]: Secondary | ICD-10-CM | POA: Diagnosis not present

## 2018-05-09 MED ORDER — LETROZOLE 2.5 MG PO TABS: 2.5000 mg | ORAL_TABLET | Freq: Every day | ORAL | 3 refills | Status: DC

## 2018-05-09 NOTE — Progress Notes (Signed)
Talked to patient today.  She received her scooter yesterday and is beyond excited!  Confirmed her gift card to [employer].  She is to call with any questions or needs.

## 2018-05-09 NOTE — Progress Notes (Signed)
I connected with Eunice Blase on 05/09/2018 at 10:45 AM EDT by video enabled telemedicine visit and verified that I am speaking with the correct person using two identifiers.  I discussed the limitations, risks, security and privacy concerns of performing an evaluation and management service by telemedicine and the availability of in-person appointments. I also discussed with the patient that there may be a patient responsible charge related to this service. The patient expressed understanding and agreed to proceed.    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 s/p mastectomy [Dr.Cannon/[place]];   # Oncotype-RS- 20; Distant recurrence-risk of recurrence 6%.  No adjuvant chemo.  No radiation.  #May 09, 2018-letrozole  # anxiety/depression  DIAGNOSIS: Right Breast ca  STAGE:  I       ;GOALS: cure  CURRENT/MOST RECENT THERAPY: Oncotype       Malignant neoplasm of lower-outer quadrant of left breast of female, estrogen receptor positive ([place])     Chief Complaint: Breast cancer   History of present illness:Kelli Logan 58 y.o.  female with history of ER positive stage I breast cancer-patient is here to discuss with results of the Oncotype testing.  Patient is healing well from surgery.  She continues to have her breast expanders.  No fever chills.  Appetite is good.  Observation/objective: None.  Assessment and plan: Malignant neoplasm of lower-outer quadrant of left breast of female, estrogen receptor positive ([place]) # Stage I left breast IMC; pT1b [6 mm] ER-Pos; PR-neg; Her 2 neu status post mastectomy.  Oncotype recurrence score 20 which translates to approximately 6% chance of distant recurrence at 9  years.  The benefit of chemotherapy is on 1%.  #Reviewed  results of the Oncotype in detail.  Given the marginal benefit from chemotherapy-would not recommend chemotherapy.  Patient agreement.  #Discussed the mechanism of action of aromatase inhibitors-with blocking of estrogen to prevent breast cancer.  Also discussed the potential side effects including but not limited to arthralgias hot flashes and increased risk of osteoporosis.  #Pain from mastectomy/expanders stable  # DISPOSITION:  # in 6 weeks- MD- Video-Dr.B    Follow-up instructions:  I discussed the assessment and treatment plan with the patient.  The patient was provided an opportunity to ask questions and all were answered.  The patient agreed with the plan and demonstrated understanding of instructions.  The patient was advised to call back or seek an in person evaluation if the symptoms worsen or if the condition fails to improve as anticipated.   Dr. Charlaine Dalton Putnam at [facility] 05/12/2018 1:05 PM

## 2018-05-09 NOTE — Assessment & Plan Note (Addendum)
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  Oncotype recurrence score 20 which translates to approximately 6% chance of distant recurrence at 9 years.  The benefit of chemotherapy is on 1%.  #Reviewed  results of the Oncotype in detail.  Given the marginal benefit from chemotherapy-would not recommend chemotherapy.  Patient agreement.  #Discussed the mechanism of action of aromatase inhibitors-with blocking of estrogen to prevent breast cancer.  Also discussed the potential side effects including but not limited to arthralgias hot flashes and increased risk of osteoporosis.  #Pain from mastectomy/expanders stable  # DISPOSITION:  # in 6 weeks- MD- Video-Dr.B

## 2018-05-13 ENCOUNTER — Ambulatory Visit: Payer: Medicare Other | Admitting: Plastic Surgery

## 2018-05-14 ENCOUNTER — Telehealth: Payer: Self-pay | Admitting: Plastic Surgery

## 2018-05-14 NOTE — Telephone Encounter (Signed)
Called patient to confirm appointment scheduled for tomorrow. Patient answered the following questions: °1.Has the patient traveled outside of the state of  at all within the past 6 weeks? No °2.Does the patient have a fever or cough at all? No °3.Has the patient been tested for COVID? Had a positive COVID test? No °4. Has the patient been in contact with anyone who has tested positive? No ° °

## 2018-05-15 ENCOUNTER — Encounter: Payer: Self-pay | Admitting: Plastic Surgery

## 2018-05-15 ENCOUNTER — Ambulatory Visit (INDEPENDENT_AMBULATORY_CARE_PROVIDER_SITE_OTHER): Payer: Medicare Other | Admitting: Plastic Surgery

## 2018-05-15 ENCOUNTER — Other Ambulatory Visit: Payer: Self-pay

## 2018-05-15 DIAGNOSIS — Z9012 Acquired absence of left breast and nipple: Secondary | ICD-10-CM
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
## 2018-05-16 ENCOUNTER — Encounter: Payer: Self-pay | Admitting: Licensed Clinical Social Worker

## 2018-05-16 ENCOUNTER — Ambulatory Visit (INDEPENDENT_AMBULATORY_CARE_PROVIDER_SITE_OTHER): Payer: Medicare Other | Admitting: Licensed Clinical Social Worker

## 2018-05-16 DIAGNOSIS — F4310 Post-traumatic stress disorder, unspecified: Secondary | ICD-10-CM | POA: Diagnosis not present
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
## 2018-05-29 ENCOUNTER — Telehealth: Payer: Self-pay | Admitting: Plastic Surgery

## 2018-05-29 NOTE — Telephone Encounter (Signed)

## 2018-05-30 ENCOUNTER — Encounter: Payer: Self-pay | Admitting: Plastic Surgery

## 2018-05-30 ENCOUNTER — Other Ambulatory Visit: Payer: Self-pay

## 2018-05-30 ENCOUNTER — Ambulatory Visit (INDEPENDENT_AMBULATORY_CARE_PROVIDER_SITE_OTHER): Payer: Medicare Other | Admitting: Nurse Practitioner

## 2018-05-30 VITALS — BP 168/90 | HR 69 | Temp 98.7°F | Ht 66.0 in | Wt 187.0 lb

## 2018-05-30 DIAGNOSIS — Z9012 Acquired absence of left breast and nipple: Secondary | ICD-10-CM

## 2018-05-30 NOTE — Progress Notes (Signed)
Patient ID: Kelli Logan, female    DOB: Dec 21, 1960, 58 y.o.   MRN: [id number]   Chief Complaint  Patient presents with  . Follow-up    2 weeks   Kelli Logan is a 58 yo female s/p left mastectomy and expander placement on 04/08/18. She is doing well today. She denies any pain. She wants to "go as big as possible." She is hopeful for a "full C cup" in a non-padded bra. She has not been exercising and does a lot of sitting. She gets "winded" while walking to the mailbox.   Review of Systems  Constitutional: Negative.   HENT: Negative.   Respiratory:       Occasional shortness of breath  Cardiovascular: Negative.   Gastrointestinal: Negative.   Genitourinary: Negative.   Musculoskeletal: Negative.   Neurological: Negative.     Past Medical History:  Diagnosis Date  . Anxiety   . Arthritis   . Cancer ([place])   . Depression   . Fibromyalgia   . History of kidney stones    h/o  . Hypertension   . Thyroid disease     Past Surgical History:  Procedure Laterality Date  . APPENDECTOMY    . BREAST BIOPSY Left few yrs ago   stereotactic bx in [place], benign  . BREAST BIOPSY Left 12/26/2017   Affirm Bx IMC- X-Clip  . BREAST BIOPSY Left 01/14/2018   Affirm Bx- Coil clip- path pending  . BREAST RECONSTRUCTION WITH PLACEMENT OF TISSUE EXPANDER AND ALLODERM Left 04/08/2018   Procedure: LEFT BREAST IMMEDIATE  RECONSTRUCTION WITH EXPANDER AND FLEX HD;  Surgeon: Wallace Going, DO;  Location: [facility] ORS;  Service: Plastics;  Laterality: Left;  . FOOT BONE EXCISION    . MASTECTOMY W/ SENTINEL NODE BIOPSY Left 04/08/2018   Procedure: MASTECTOMY WITH SENTINEL LYMPH NODE BIOPSY LEFT;  Surgeon: Fredirick Maudlin, MD;  Location: [facility] ORS;  Service: General;  Laterality: Left;  . [place] and 1985      Current Outpatient Medications:  .  atorvastatin (LIPITOR) 40 MG tablet, Take 40 mg by mouth at bedtime. , Disp: , Rfl:  .  Biotin 5 MG TABS, Take 5 mg by mouth daily.,  Disp: , Rfl:  .  Chromium Picolinate (CHROMIUM PICOLATE PO), Take 1,000 mcg by mouth daily., Disp: , Rfl:  .  citalopram (CELEXA) 40 MG tablet, Take 40 mg by mouth every morning. , Disp: , Rfl:  .  diazepam (VALIUM) 2 MG tablet, Take 1 tablet (2 mg total) by mouth every 12 (twelve) hours as needed for anxiety or muscle spasms., Disp: 30 tablet, Rfl: 0 .  Glucosamine-Chondroitin 1500-1200 MG/30ML LIQD, Take 2 tablets by mouth daily., Disp: , Rfl:  .  hydrochlorothiazide (HYDRODIURIL) 25 MG tablet, Take 25 mg by mouth daily. , Disp: , Rfl:  .  HYDROcodone-acetaminophen (NORCO) 5-325 MG tablet, Take 1 tablet by mouth every 6 (six) hours as needed for moderate pain., Disp: 30 tablet, Rfl: 0 .  hydrOXYzine (ATARAX/VISTARIL) 25 MG tablet, TAKE 1 TABLET (25 MG TOTAL) BY MOUTH 3 (THREE) TIMES DAILY AS NEEDED FOR ANXIETY., Disp: 90 tablet, Rfl: 0 .  letrozole (FEMARA) 2.5 MG tablet, Take 1 tablet (2.5 mg total) by mouth daily., Disp: 90 tablet, Rfl: 3 .  levothyroxine (SYNTHROID, LEVOTHROID) 50 MCG tablet, Take 50 mcg by mouth every other day. , Disp: , Rfl:  .  levothyroxine (SYNTHROID, LEVOTHROID) 75 MCG tablet, Take 75 mcg by mouth every other day., Disp: ,  Rfl:  .  lisinopril (ZESTRIL) 40 MG tablet, Take 40 mg by mouth daily., Disp: , Rfl:  .  metoprolol (LOPRESSOR) 50 MG tablet, Take 1 tablet (50 mg total) by mouth 2 (two) times daily. (Patient taking differently: Take 75 mg by mouth 2 (two) times daily. ), Disp: 60 tablet, Rfl: 0 .  naproxen (NAPROSYN) 500 MG tablet, Take 500 mg by mouth 2 (two) times daily as needed for moderate pain., Disp: , Rfl:  .  nortriptyline (PAMELOR) 10 MG capsule, Take 30 mg by mouth at bedtime. , Disp: , Rfl:  .  omeprazole (PRILOSEC) 20 MG capsule, Take 20 mg by mouth every morning. , Disp: , Rfl:    Objective:   Vitals:   05/30/18 1247  BP: (!) 168/90  Pulse: 69  Temp: 98.7 F (37.1 C)  SpO2: 97%    Physical Exam  General: alert, calm, no acute distress  Chest: symmetrical rise and fall Lungs: unlabored breathing Breast: left mastectomy with healing surgical scar, expander in place Cardiac: +2 bilateral radial pulse, cap refill <3 sec Musculoskeletal: MAEx4 Neuro: A&O x3, calm, cooperative, steady gait  Assessment & Plan:  Kelli Logan is a 58 yo female s/p left mastectomy with expander placement. She is doing well with expander injections and is close her maximum size.    We placed injectable saline in the Expander using a sterile technique: Left: 50 cc for a total of 500 / 535 cc  Follow up in 2 weeks   Mayah Dozier-Lineberger, NP

## 2018-06-03 DIAGNOSIS — F419 Anxiety disorder, unspecified: Secondary | ICD-10-CM | POA: Insufficient documentation

## 2018-06-04 ENCOUNTER — Ambulatory Visit (INDEPENDENT_AMBULATORY_CARE_PROVIDER_SITE_OTHER): Payer: Medicare Other | Admitting: Licensed Clinical Social Worker

## 2018-06-04 ENCOUNTER — Other Ambulatory Visit: Payer: Self-pay

## 2018-06-04 ENCOUNTER — Telehealth: Payer: Self-pay | Admitting: *Deleted

## 2018-06-04 ENCOUNTER — Encounter: Payer: Self-pay | Admitting: Licensed Clinical Social Worker

## 2018-06-04 DIAGNOSIS — F4310 Post-traumatic stress disorder, unspecified: Secondary | ICD-10-CM | POA: Diagnosis not present

## 2018-06-04 DIAGNOSIS — F410 Panic disorder [episodic paroxysmal anxiety] without agoraphobia: Secondary | ICD-10-CM

## 2018-06-04 DIAGNOSIS — F424 Excoriation (skin-picking) disorder: Secondary | ICD-10-CM
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
## 2018-06-04 NOTE — Telephone Encounter (Signed)
Patient informed of physician response and told to expect a call from scheduler for appointment tomorrow

## 2018-06-04 NOTE — Telephone Encounter (Signed)
Brenda-please asked patient to stop taking letrozole.  She can take Tylenol/Advil for cramps as needed q 6 to 8 hours.  C-please schedule a doximity visit with me tomorrow in AM. No labs.
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
## 2018-06-05 ENCOUNTER — Encounter: Payer: Self-pay | Admitting: Internal Medicine

## 2018-06-05 ENCOUNTER — Inpatient Hospital Stay (HOSPITAL_BASED_OUTPATIENT_CLINIC_OR_DEPARTMENT_OTHER): Payer: Medicare Other | Admitting: Internal Medicine

## 2018-06-05 DIAGNOSIS — C50512 Malignant neoplasm of lower-outer quadrant of left female breast: Secondary | ICD-10-CM

## 2018-06-05 DIAGNOSIS — Z170 Estrogen receptor positive status [ER+]: Secondary | ICD-10-CM | POA: Diagnosis not present

## 2018-06-05 NOTE — Progress Notes (Signed)
I connected with Kelli Logan on 06/05/18 at  9:00 AM EDT by video enabled telemedicine visit and verified that I am speaking with the correct person using two identifiers.  I discussed the limitations, risks, security and privacy concerns of performing an evaluation and management service by telemedicine and the availability of in-person appointments. I also discussed with the patient that there may be a patient responsible charge related to this service. The patient expressed understanding and agreed to proceed.    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 s/p mastectomy [Dr.Cannon/[place]];   # Oncotype-RS- 20; Distant recurrence-risk of recurrence 6%.  No adjuvant chemo.  No radiation.  #May 09, 2018-letrozole [LMP- 2018]  # anxiety/depression  DIAGNOSIS: Right Breast ca  STAGE:  I; GOALS: cure  CURRENT/MOST RECENT THERAPY: Oncotype       Malignant neoplasm of lower-outer quadrant of left breast of female, estrogen receptor positive ([place])    Chief Complaint: Breast cancer  History of present illness:Kelli Logan 58 y.o.  female with history of breast cancer currently on letrozole for the last 1 month.  Patient complains of progressive nausea vomiting for the last 1 to 2 weeks.  Complains of body aches.  Extremely fatigued.  Intermittent diarrhea.  Complains of extreme hot flashes.  States that she is in a lot of social stress.  Patient denies any fevers.  Denies any cough.   Observation/objective: none  Assessment and plan: Malignant neoplasm of lower-outer quadrant of left breast of female, estrogen receptor positive ([place]) # Stage I left breast IMC; pT1b [6 mm] ER-Pos;  PR-neg; Her 2 neu status post mastectomy.  LOW risk-Oncotype. Currently on Letrozole.   # STOP letrozole-secondary intolerance [hot flashes fatigue nausea].  Is not clear to me if patient's multitude of symptoms are related to letrozole alone versus other causes.  However we will give a trial of holding letrozole for the next 5 weeks.  #Patient wanted to come off all antiestrogen therapy given the side effects.  I recommended not to make any decisions at this time; allow improvement of symptoms.   # "Feeling poorly"-question anxiety/stress versus letrozole rather than infectious causes.  #Vaginal discharge-has appointment with gynecology this morning.  # DISPOSITION:  # follow up in 5 weeks- MD- DOX; in 4 weeks- labs- FSH/ estradiol/ LH/ cmp/cbc/ Thyroid profile [[facility]- NEEDS to have script MAILED to pt's home]-Dr.B  Follow-up instructions:  I discussed the assessment and treatment plan with the patient.  The patient was provided an opportunity to ask questions and all were answered.  The patient agreed with the plan and demonstrated understanding of instructions.  The patient was advised to call back or seek an in person evaluation if the symptoms worsen or if the condition fails to improve as anticipated.  I provided 25 minutes of face-to-face video visit time during this encounter, and > 50% was spent counseling as documented under my assessment & plan.  Dr. Charlaine Dalton Raoul at [facility] 06/05/2018 9:21 AM

## 2018-06-05 NOTE — Assessment & Plan Note (Addendum)
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  LOW risk-Oncotype. Currently on Letrozole.   # STOP letrozole-secondary intolerance [hot flashes fatigue nausea].  Is not clear to me if patient's multitude of symptoms are related to letrozole alone versus other causes.  However we will give a trial of holding letrozole for the next 5 weeks.  #Patient wanted to come off all antiestrogen therapy given the side effects.  I recommended not to make any decisions at this time; allow improvement of symptoms.   # "Feeling poorly"-question anxiety/stress versus letrozole rather than infectious causes.  #Vaginal discharge-has appointment with gynecology this morning.  # DISPOSITION:  # follow up in 5 weeks- MD- DOX; in 4 weeks- labs- FSH/ estradiol/ LH/ cmp/cbc/ Thyroid profile [[facility]- NEEDS to have script MAILED to pt's home]-Dr.B

## 2018-06-06 ENCOUNTER — Telehealth: Payer: Self-pay | Admitting: Cardiovascular Disease

## 2018-06-06 NOTE — Telephone Encounter (Signed)
Virtual Visit Pre-Appointment Phone Call  "(Name), I am calling you today to discuss your upcoming appointment. We are currently trying to limit exposure to the virus that causes COVID-19 by seeing patients at home rather than in the office."  1. "What is the BEST phone number to call the day of the visit?" - include this in appointment notes  2. Do you have or have access to (through a family member/friend) a smartphone with video capability that we can use for your visit?" a. If yes - list this number in appt notes as cell (if different from BEST phone #) and list the appointment type as a VIDEO visit in appointment notes b. If no - list the appointment type as a PHONE visit in appointment notes  3. Confirm consent - "In the setting of the current Covid19 crisis, you are scheduled for a (phone or video) visit with your provider on (date) at (time).  Just as we do with many in-office visits, in order for you to participate in this visit, we must obtain consent.  If you'd like, I can send this to your mychart (if signed up) or email for you to review.  Otherwise, I can obtain your verbal consent now.  All virtual visits are billed to your insurance company just like a normal visit would be.  By agreeing to a virtual visit, we'd like you to understand that the technology does not allow for your provider to perform an examination, and thus may limit your provider's ability to fully assess your condition. If your provider identifies any concerns that need to be evaluated in person, we will make arrangements to do so.  Finally, though the technology is pretty good, we cannot assure that it will always work on either your or our end, and in the setting of a video visit, we may have to convert it to a phone-only visit.  In either situation, we cannot ensure that we have a secure connection.  Are you willing to proceed?" STAFF: Did the patient verbally acknowledge consent to telehealth visit? Document  YES/NO here:YES   4. Advise patient to be prepared - "Two hours prior to your appointment, go ahead and check your blood pressure, pulse, oxygen saturation, and your weight (if you have the equipment to check those) and write them all down. When your visit starts, your provider will ask you for this information. If you have an Apple Watch or Kardia device, please plan to have heart rate information ready on the day of your appointment. Please have a pen and paper handy nearby the day of the visit as well."  5. Give patient instructions for MyChart download to smartphone OR Doximity/Doxy.me as below if video visit (depending on what platform provider is using)  6. Inform patient they will receive a phone call 15 minutes prior to their appointment time (may be from unknown caller ID) so they should be prepared to answer    TELEPHONE CALL NOTE  Yaniris Braddock has been deemed a candidate for a follow-up tele-health visit to limit community exposure during the Covid-19 pandemic. I spoke with the patient via phone to ensure availability of phone/video source, confirm preferred email & phone number, and discuss instructions and expectations.  I reminded Burnie Hank to be prepared with any vital sign and/or heart rhythm information that could potentially be obtained via home monitoring, at the time of her visit. I reminded Annalaya Wile to expect a phone call prior to her visit.  Caryl Pina Gerringer 06/06/2018 4:06 PM   INSTRUCTIONS FOR DOWNLOADING THE MYCHART APP TO SMARTPHONE  - The patient must first make sure to have activated MyChart and know their login information - If Apple, go to [employer] and type in MyChart in the search bar and download the app. If Android, ask patient to go to [employer] and type in [place] in the search bar and download the app. The app is free but as with any other app downloads, their phone may require them to verify saved payment information or Apple/Android  password.  - The patient will need to then log into the app with their MyChart username and password, and select  as their healthcare provider to link the account. When it is time for your visit, go to the MyChart app, find appointments, and click Begin Video Visit. Be sure to Select Allow for your device to access the Microphone and Camera for your visit. You will then be connected, and your provider will be with you shortly.  **If they have any issues connecting, or need assistance please contact MyChart service desk [phone])**  **If using a computer, in order to ensure the best quality for their visit they will need to use either of the following Internet Browsers: [employer], or Google Chrome**  IF USING DOXIMITY or DOXY.ME - The patient will receive a link just prior to their visit by text.     FULL LENGTH CONSENT FOR TELE-HEALTH VISIT   I hereby voluntarily request, consent and authorize [facility] and its employed or contracted physicians, physician assistants, nurse practitioners or other licensed health care professionals (the Practitioner), to provide me with telemedicine health care services (the Services") as deemed necessary by the treating Practitioner. I acknowledge and consent to receive the Services by the Practitioner via telemedicine. I understand that the telemedicine visit will involve communicating with the Practitioner through live audiovisual communication technology and the disclosure of certain medical information by electronic transmission. I acknowledge that I have been given the opportunity to request an in-person assessment or other available alternative prior to the telemedicine visit and am voluntarily participating in the telemedicine visit.  I understand that I have the right to withhold or withdraw my consent to the use of telemedicine in the course of my care at any time, without affecting my right to future care or treatment,  and that the Practitioner or I may terminate the telemedicine visit at any time. I understand that I have the right to inspect all information obtained and/or recorded in the course of the telemedicine visit and may receive copies of available information for a reasonable fee.  I understand that some of the potential risks of receiving the Services via telemedicine include:   Delay or interruption in medical evaluation due to technological equipment failure or disruption;  Information transmitted may not be sufficient (e.g. poor resolution of images) to allow for appropriate medical decision making by the Practitioner; and/or   In rare instances, security protocols could fail, causing a breach of personal health information.  Furthermore, I acknowledge that it is my responsibility to provide information about my medical history, conditions and care that is complete and accurate to the best of my ability. I acknowledge that Practitioner's advice, recommendations, and/or decision may be based on factors not within their control, such as incomplete or inaccurate data provided by me or distortions of diagnostic images or specimens that may result from electronic transmissions. I understand that the practice  of medicine is not an Chief Strategy Officer and that Practitioner makes no warranties or guarantees regarding treatment outcomes. I acknowledge that I will receive a copy of this consent concurrently upon execution via email to the email address I last provided but may also request a printed copy by calling the office of [facility].    I understand that my insurance will be billed for this visit.   I have read or had this consent read to me.  I understand the contents of this consent, which adequately explains the benefits and risks of the Services being provided via telemedicine.   I have been provided ample opportunity to ask questions regarding this consent and the Services and have had my questions  answered to my satisfaction.  I give my informed consent for the services to be provided through the use of telemedicine in my medical care  By participating in this telemedicine visit I agree to the above.
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
## 2018-06-09 ENCOUNTER — Other Ambulatory Visit: Payer: Self-pay

## 2018-06-09 ENCOUNTER — Telehealth (INDEPENDENT_AMBULATORY_CARE_PROVIDER_SITE_OTHER): Payer: Medicare Other | Admitting: Cardiovascular Disease

## 2018-06-09 DIAGNOSIS — I10 Essential (primary) hypertension: Secondary | ICD-10-CM | POA: Insufficient documentation

## 2018-06-09 DIAGNOSIS — Z9012 Acquired absence of left breast and nipple: Secondary | ICD-10-CM

## 2018-06-09 DIAGNOSIS — I479 Paroxysmal tachycardia, unspecified: Secondary | ICD-10-CM

## 2018-06-09 DIAGNOSIS — C50919 Malignant neoplasm of unspecified site of unspecified female breast: Secondary | ICD-10-CM | POA: Diagnosis not present

## 2018-06-09 MED ORDER — DOXAZOSIN MESYLATE 1 MG PO TABS: 1.0000 mg | ORAL_TABLET | Freq: Two times a day (BID) | ORAL | 3 refills | Status: DC

## 2018-06-09 NOTE — Patient Instructions (Addendum)
Medication Instructions:  Your physician has recommended you make the following change in your medication:  1. START Doxazosin (Cardura) 1 mg twice a day  Your physician has requested that you regularly monitor and record your blood pressure readings at home. Please use the same machine at the same time of day to check your readings and record them to bring to your follow-up visit.  See below for additional information about blood pressure monitoring.  If you need a refill on your cardiac medications before your next appointment, please call your pharmacy.    Lab work: No new labs needed   If you have labs (blood work) drawn today and your tests are completely normal, you will receive your results only by: Marland Kitchen MyChart Message (if you have MyChart) OR . A paper copy in the mail If you have any lab test that is abnormal or we need to change your treatment, we will call you to review the results.   Testing/Procedures: No new testing needed   Follow-Up: At [facility], you and your health needs are our priority.  As part of our continuing mission to provide you with exceptional heart care, we have created designated Provider Care Teams.  These Care Teams include your primary Cardiologist (physician) and Advanced Practice Providers (APPs -  Physician Assistants and Nurse Practitioners) who all work together to provide you with the care you need, when you need it.  . You will need a follow up appointment as needed, please call with blood pressure numbers  .   Please call our office 2 months in advance to schedule this appointment.    . Providers on your designated Care Team:   . Murray Hodgkins, NP . Christell Faith, PA-C . Marrianne Mood, PA-C  Any Other Special Instructions Will Be Listed Below (If Applicable).  For educational health videos Log in to : [email or web address] Or : [email or web address], password : [email or web address]   How to Take Your Blood Pressure You can take your blood pressure at  home with a machine. You may need to check your blood pressure at home:  To check if you have high blood pressure (hypertension).  To check your blood pressure over time.  To make sure your blood pressure medicine is working. Supplies needed: You will need a blood pressure machine, or monitor. You can buy one at a drugstore or online. When choosing one:  Choose one with an arm cuff.  Choose one that wraps around your upper arm. Only one finger should fit between your arm and the cuff.  Do not choose one that measures your blood pressure from your wrist or finger. Your doctor can suggest a monitor. How to prepare Avoid these things for 30 minutes before checking your blood pressure:  Drinking caffeine.  Drinking alcohol.  Eating.  Smoking.  Exercising. Five minutes before checking your blood pressure:  Pee.  Sit in a dining chair. Avoid sitting in a soft couch or armchair.  Be quiet. Do not talk. How to take your blood pressure Follow the instructions that came with your machine. If you have a digital blood pressure monitor, these may be the instructions: 1. Sit up straight. 2. Place your feet on the floor. Do not cross your ankles or legs. 3. Rest your left arm at the level of your heart. You may rest it on a table, desk, or chair. 4. Pull up your shirt sleeve. 5. Wrap the blood pressure cuff around the upper part of your left arm. The cuff  should be 1 inch (2.5 cm) above your elbow. It is best to wrap the cuff around bare skin. 6. Fit the cuff snugly around your arm. You should be able to place only one finger between the cuff and your arm. 7. Put the cord inside the groove of your elbow. 8. Press the power button. 9. Sit quietly while the cuff fills with air and loses air. 10. Write down the numbers on the screen. 11. Wait 2-3 minutes and then repeat steps 1-10. What do the numbers mean? Two numbers make up your blood pressure. The first number is called systolic  pressure. The second is called diastolic pressure. An example of a blood pressure reading is "120 over 80" (or 120/80). If you are an adult and do not have a medical condition, use this guide to find out if your blood pressure is normal: Normal  First number: below 120.  Second number: below 80. Elevated  First number: 120-129.  Second number: below 80. Hypertension stage 1  First number: 130-139.  Second number: 80-89. Hypertension stage 2  First number: 140 or above.  Second number: 58 or above. Your blood pressure is above normal even if only the top or bottom number is above normal. Follow these instructions at home:  Check your blood pressure as often as your doctor tells you to.  Take your monitor to your next doctor's appointment. Your doctor will: ? Make sure you are using it correctly. ? Make sure it is working right.  Make sure you understand what your blood pressure numbers should be.  Tell your doctor if your medicines are causing side effects. Contact a doctor if:  Your blood pressure keeps being high. Get help right away if:  Your first blood pressure number is higher than 180.  Your second blood pressure number is higher than 120. This information is not intended to replace advice given to you by your health care provider. Make sure you discuss any questions you have with your health care provider. Document Released: 12/08/2007 Document Revised: 11/23/2015 Document Reviewed: 06/03/2015 Elsevier Interactive Patient Education  2019 [employer] Inc[employer]  Blood Pressure Record Sheet To take your blood pressure, you will need a blood pressure machine. You can buy a blood pressure machine (blood pressure monitor) at your clinic, drug store, or online. When choosing one, consider:  An automatic monitor that has an arm cuff.  A cuff that wraps snugly around your upper arm. You should be able to fit only one finger between your arm and the cuff.  A device that  stores blood pressure reading results.  Do not choose a monitor that measures your blood pressure from your wrist or finger. Follow your health care provider's instructions for how to take your blood pressure. To use this form:  Get one reading in the morning (a.m.) before you take any medicines.  Get one reading in the evening (p.m.) before supper.  Take at least 2 readings with each blood pressure check. This makes sure the results are correct. Wait 1-2 minutes between measurements.  Write down the results in the spaces on this form.  Repeat this once a week, or as told by your health care provider.  Make a follow-up appointment with your health care provider to discuss the results. Blood pressure log Date: _______________________  a.m. _____________________(1st reading) _____________________(2nd reading)  p.m. _____________________(1st reading) _____________________(2nd reading) Date: _______________________  a.m. _____________________(1st reading) _____________________(2nd reading)  p.m. _____________________(1st reading) _____________________(2nd reading) Date: _______________________  a.m. _____________________(1st reading)  _____________________(2nd reading)  p.m. _____________________(1st reading) _____________________(2nd reading) Date: _______________________  a.m. _____________________(1st reading) _____________________(2nd reading)  p.m. _____________________(1st reading) _____________________(2nd reading) Date: _______________________  a.m. _____________________(1st reading) _____________________(2nd reading)  p.m. _____________________(1st reading) _____________________(2nd reading) This information is not intended to replace advice given to you by your health care provider. Make sure you discuss any questions you have with your health care provider. Document Released: 09/23/2002 Document Revised: 12/25/2016 Document Reviewed: 12/25/2016 Elsevier Interactive  Patient Education  2019 [employer].

## 2018-06-10 ENCOUNTER — Telehealth: Payer: Medicare Other | Admitting: Cardiovascular Disease

## 2018-06-12 ENCOUNTER — Other Ambulatory Visit: Payer: Self-pay

## 2018-06-12 ENCOUNTER — Telehealth: Payer: Self-pay | Admitting: Plastic Surgery

## 2018-06-12 ENCOUNTER — Encounter: Payer: Self-pay | Admitting: Gastroenterology

## 2018-06-12 ENCOUNTER — Ambulatory Visit (INDEPENDENT_AMBULATORY_CARE_PROVIDER_SITE_OTHER): Payer: Medicare Other | Admitting: Gastroenterology

## 2018-06-12 VITALS — BP 122/78 | HR 73 | Temp 98.8°F | Ht 66.0 in | Wt 189.2 lb

## 2018-06-12 DIAGNOSIS — Z1211 Encounter for screening for malignant neoplasm of colon: Secondary | ICD-10-CM

## 2018-06-12 DIAGNOSIS — R1310 Dysphagia, unspecified: Secondary | ICD-10-CM | POA: Diagnosis not present

## 2018-06-12 DIAGNOSIS — K219 Gastro-esophageal reflux disease without esophagitis: Secondary | ICD-10-CM

## 2018-06-12 DIAGNOSIS — R1319 Other dysphagia: Secondary | ICD-10-CM

## 2018-06-12 NOTE — Telephone Encounter (Signed)

## 2018-06-12 NOTE — Progress Notes (Signed)
Kelli Logan [address]  [place], [place] [address]  Main: [phone]  Fax: [phone]   Gastroenterology Consultation  Referring Provider:     Gennette Pac, FNP Primary Care Physician:  Gennette Pac, [place] Reason for Consultation:     GERD, constipation        HPI:    Chief Complaint  Patient presents with  . Gastroesophageal Reflux  . Constipation    Kelli Logan is a 58 y.o. y/o female referred for consultation & management  by Dr. Gennette Pac, [place].  Patient's history is very tangential.  However, she reports 5 to 30-month history of constipation.  Describes episodes of needing multiple enemas to have a bowel movement.  However, as is last few weeks reports going daily and it is soft, about once or twice a day.  Attributes this to change in her diet.  No nausea or vomiting, no weight loss.  Is not taking anything for her bowel movements at this time.  Also reports large external hemorrhoids but no bleeding.  No prior colonoscopy.  No family history of colon cancer.  Also reports daily reflux that is controlled with once daily omeprazole.  However, reports intermittent dysphagia with solids about once or twice a week for the last 4 to 6 months.  No dysphagia with liquids.  No prior history of food impaction.  No prior upper endoscopy.  Past Medical History:  Diagnosis Date  . Anxiety   . Arthritis   . Cancer ([place])   . Depression   . Fibromyalgia   . History of kidney stones    h/o  . Hypertension   . Thyroid disease     Past Surgical History:  Procedure Laterality Date  . APPENDECTOMY    . BREAST BIOPSY Left few yrs ago   stereotactic bx in [place], benign  . BREAST BIOPSY Left 12/26/2017   Affirm Bx IMC- X-Clip  . BREAST BIOPSY Left 01/14/2018   Affirm Bx- Coil clip- path pending  . BREAST RECONSTRUCTION WITH PLACEMENT OF TISSUE EXPANDER AND ALLODERM Left 04/08/2018   Procedure: LEFT BREAST IMMEDIATE  RECONSTRUCTION WITH EXPANDER  AND FLEX HD;  Surgeon: Wallace Going, DO;  Location: [facility] ORS;  Service: Plastics;  Laterality: Left;  . FOOT BONE EXCISION    . MASTECTOMY W/ SENTINEL NODE BIOPSY Left 04/08/2018   Procedure: MASTECTOMY WITH SENTINEL LYMPH NODE BIOPSY LEFT;  Surgeon: Fredirick Maudlin, MD;  Location: [facility] ORS;  Service: General;  Laterality: Left;  . [place]    Prior to Admission medications   Medication Sig Start Date End Date Taking? Authorizing Provider  atorvastatin (LIPITOR) 40 MG tablet Take 40 mg by mouth at bedtime.    Yes [provider]  Biotin 5 MG TABS Take 5 mg by mouth daily.   Yes [provider]  Chromium Picolinate (CHROMIUM PICOLATE PO) Take 1,000 mcg by mouth daily.   Yes [provider]  citalopram (CELEXA) 40 MG tablet Take 40 mg by mouth every morning.    Yes [provider]  diazepam (VALIUM) 2 MG tablet Take 1 tablet (2 mg total) by mouth every 12 (twelve) hours as needed for anxiety or muscle spasms. 04/28/18 04/28/19 Yes Dillingham, Loel Lofty, DO  doxazosin (CARDURA) 1 MG tablet Take 1 tablet (1 mg total) by mouth 2 (two) times daily. 06/09/18  Yes Minna Merritts, MD  Glucosamine-Chondroitin 1500-1200 MG/30ML LIQD Take 2 tablets by mouth daily. 02/17/18  Yes [provider]  hydrochlorothiazide (HYDRODIURIL) 25 MG tablet Take 25 mg by mouth daily.  10/12/17  Yes [provider]  HYDROcodone-acetaminophen (NORCO) 5-325 MG tablet Take 1 tablet by mouth every 6 (six) hours as needed for moderate pain. 04/01/18  Yes Dillingham, Loel Lofty, DO  hydrOXYzine (ATARAX/VISTARIL) 25 MG tablet TAKE 1 TABLET (25 MG TOTAL) BY MOUTH 3 (THREE) TIMES DAILY AS NEEDED FOR ANXIETY. 05/05/18  Yes Ursula Alert, MD  letrozole (FEMARA) 2.5 MG tablet Take 1 tablet (2.5 mg total) by mouth daily. 05/09/18  Yes Cammie Sickle, MD  levothyroxine (SYNTHROID, LEVOTHROID) 50 MCG tablet Take 50 mcg by mouth every other day.   11/07/17  Yes [provider]  levothyroxine (SYNTHROID, LEVOTHROID) 75 MCG tablet Take 75 mcg by mouth every other day.   Yes [provider]  lisinopril (ZESTRIL) 40 MG tablet Take 40 mg by mouth daily.   Yes [provider]  metoprolol (LOPRESSOR) 50 MG tablet Take 1 tablet (50 mg total) by mouth 2 (two) times daily. Patient taking differently: Take 75 mg by mouth 2 (two) times daily.  03/16/16  Yes Gregor Hams, MD  naproxen (NAPROSYN) 500 MG tablet Take 500 mg by mouth 2 (two) times daily as needed for moderate pain.   Yes [provider]  nortriptyline (PAMELOR) 10 MG capsule Take 30 mg by mouth at bedtime.  03/17/18  Yes [provider]  omeprazole (PRILOSEC) 20 MG capsule Take 20 mg by mouth every morning.  11/07/17  Yes [provider]    Family History  Problem Relation Age of Onset  . Hypertension Mother   . Lung cancer Maternal Grandmother   . Heart disease Father   . Alcohol abuse Father   . Breast cancer Neg Hx   . Colon cancer Neg Hx      Social History   Tobacco Use  . Smoking status: Former Smoker    Packs/day: 1.00    Years: 2.00    Pack years: 2.00    Types: Cigarettes    Last attempt to quit: 01/06/2017    Years since quitting: 1.4  . Smokeless tobacco: Never Used  Substance Use Topics  . Alcohol use: Yes    Comment: rare beer  . Drug use: Not Currently    Allergies as of 06/12/2018  . (No Known Allergies)    Review of Systems:    All systems reviewed and negative except where noted in HPI.   Physical Exam:  BP 122/78   Pulse 73   Temp 98.8 F (37.1 C) (Oral)   Ht 5\' 6"  (1.676 m)   Wt 189 lb 3.2 oz (85.8 kg)   BMI 30.54 kg/m  No LMP recorded. Patient is postmenopausal. Psych:  Alert and cooperative. Normal mood and affect. General:   Alert,  Well-developed, well-nourished, pleasant and cooperative in NAD Head:  Normocephalic and atraumatic. Eyes:  Sclera clear, no icterus.   Conjunctiva  pink. Ears:  Normal auditory acuity. Nose:  No deformity, discharge, or lesions. Mouth:  No deformity or lesions,oropharynx pink & moist. Neck:  Supple; no masses or thyromegaly. Abdomen:  Normal bowel sounds.  No bruits.  Soft, non-tender and non-distended without masses, hepatosplenomegaly or hernias noted.  No guarding or rebound tenderness.    Msk:  Symmetrical without gross deformities. Good, equal movement & strength bilaterally. Pulses:  Normal pulses noted. Extremities:  No clubbing or edema.  No cyanosis. Neurologic:  Alert and oriented x3;  grossly normal  neurologically. Skin:  Intact without significant lesions or rashes. No jaundice. Lymph Nodes:  No significant cervical adenopathy. Psych:  Alert and cooperative. Normal mood and affect.   Labs: CBC    Component Value Date/Time   WBC 9.5 04/12/2018 2236   RBC 4.10 04/12/2018 2236   HGB 11.6 (L) 04/12/2018 2236   HCT 34.6 (L) 04/12/2018 2236   PLT 367 04/12/2018 2236   MCV 84.4 04/12/2018 2236   MCH 28.3 04/12/2018 2236   MCHC 33.5 04/12/2018 2236   RDW 12.6 04/12/2018 2236   LYMPHSABS 2.9 04/12/2018 2236   MONOABS 1.0 04/12/2018 2236   EOSABS 0.3 04/12/2018 2236   BASOSABS 0.1 04/12/2018 2236   CMP     Component Value Date/Time   NA 131 (L) 04/12/2018 2236   K 3.7 04/12/2018 2236   CL 99 04/12/2018 2236   CO2 24 04/12/2018 2236   GLUCOSE 136 (H) 04/12/2018 2236   BUN 14 04/12/2018 2236   CREATININE 0.80 04/12/2018 2236   CALCIUM 9.1 04/12/2018 2236   PROT 7.0 12/18/2017 0944   ALBUMIN 4.0 12/18/2017 0944   AST 30 12/18/2017 0944   ALT 30 12/18/2017 0944   ALKPHOS 73 12/18/2017 0944   BILITOT 0.9 12/18/2017 0944   GFRNONAA >60 04/12/2018 2236   GFRAA >60 04/12/2018 2236    Imaging Studies: No results found.  Assessment and Plan:   Kelli Logan is a 57 y.o. y/o female has been referred for GERD and constipation  Given chronic history of GERD with inability to come off PPI due to return of  symptoms off the medication, and intermittent dysphagia EGD is indicated for further evaluation  Patient also has never had a screening colonoscopy and is interested in this along with her EGD as well  I have discussed alternative options, risks & benefits,  which include, but are not limited to, bleeding, infection, perforation,respiratory complication & drug reaction.  The patient agrees with this plan & written consent will be obtained.    Patient educated extensively on acid reflux lifestyle modification, including buying a bed wedge, not eating 3 hrs before bedtime, diet modifications, and handout given for the same.      Dr Kelli Logan  Speech recognition software was used to dictate the above note.

## 2018-06-13 ENCOUNTER — Ambulatory Visit: Payer: Medicare Other | Admitting: Plastic Surgery

## 2018-06-13 ENCOUNTER — Encounter: Payer: Self-pay | Admitting: Plastic Surgery

## 2018-06-13 ENCOUNTER — Ambulatory Visit (INDEPENDENT_AMBULATORY_CARE_PROVIDER_SITE_OTHER): Payer: Medicare Other | Admitting: Plastic Surgery

## 2018-06-13 VITALS — BP 123/77 | HR 67 | Temp 98.2°F | Ht 66.0 in | Wt 189.0 lb

## 2018-06-13 DIAGNOSIS — Z9012 Acquired absence of left breast and nipple: Secondary | ICD-10-CM

## 2018-06-13 NOTE — Progress Notes (Signed)
   Subjective:    Patient ID: Kelli Logan, female    DOB: 1960/05/11, 58 y.o.   MRN: [id number]  The patient is a 58 year old female here for follow-up on her left breast reconstruction.  She is overall doing well.  She is getting more symmetric almost looks symmetric today from a size standpoint.  She is very pleased with her progress.  She is aware that there is a little bit of ptosis on the left which is as expected due to the ptosis she had preoperatively.  This was discussed preoperatively as well.  Her incisions are healing well there does not appear to be any sign of seroma or hematoma.       Review of Systems  Constitutional: Negative.  Negative for activity change and appetite change.  HENT: Negative.   Eyes: Negative.   Respiratory: Negative.   Cardiovascular: Negative.   Gastrointestinal: Negative.   Endocrine: Negative.   Genitourinary: Negative.   Musculoskeletal: Negative.   Hematological: Negative.   Psychiatric/Behavioral: Negative.        Objective:   Physical Exam Vitals signs and nursing note reviewed.  Constitutional:      Appearance: Normal appearance.  HENT:     Head: Normocephalic and atraumatic.  Cardiovascular:     Rate and Rhythm: Normal rate.     Pulses: Normal pulses.  Pulmonary:     Effort: Pulmonary effort is normal.  Abdominal:     General: Abdomen is flat.  Neurological:     General: No focal deficit present.     Mental Status: She is alert and oriented to person, place, and time.  Psychiatric:        Mood and Affect: Mood normal.        Behavior: Behavior normal.        Assessment & Plan:  Acquired absence of left breast  We placed injectable saline in the Expander using a sterile technique: Left: 50 cc for a total of 550 / 535 cc  Plan to see her back in 2 weeks.  We will likely refill.  Pictures taken and placed in chart with patient permission.  We will plan on schedule.  Plan for removal of left breast expander placement  of implant and right mastopexy.

## 2018-06-15 ENCOUNTER — Other Ambulatory Visit: Payer: Self-pay | Admitting: Psychiatry

## 2018-06-15 DIAGNOSIS — F4310 Post-traumatic stress disorder, unspecified: Secondary | ICD-10-CM

## 2018-06-17 DIAGNOSIS — Z8659 Personal history of other mental and behavioral disorders: Secondary | ICD-10-CM | POA: Insufficient documentation

## 2018-06-19 ENCOUNTER — Ambulatory Visit (INDEPENDENT_AMBULATORY_CARE_PROVIDER_SITE_OTHER): Payer: Medicare Other | Admitting: Licensed Clinical Social Worker

## 2018-06-19 ENCOUNTER — Other Ambulatory Visit: Payer: Self-pay

## 2018-06-19 ENCOUNTER — Encounter: Payer: Self-pay | Admitting: Licensed Clinical Social Worker

## 2018-06-19 DIAGNOSIS — F4310 Post-traumatic stress disorder, unspecified: Secondary | ICD-10-CM | POA: Diagnosis not present

## 2018-06-19 NOTE — Progress Notes (Signed)
  Virtual Visit via Video Note  I connected with Kelli Logan on 06/19/18 at 11:00 AM EDT by a video enabled telemedicine application and verified that I am speaking with the correct person using two identifiers.   I discussed the limitations of evaluation and management by telemedicine and the availability of in person appointments. The patient expressed understanding and agreed to proceed.  I discussed the assessment and treatment plan with the patient. The patient was provided an opportunity to ask questions and all were answered. The patient agreed with the plan and demonstrated an understanding of the instructions.   The patient was advised to call back or seek an in-person evaluation if the symptoms worsen or if the condition fails to improve as anticipated.  I provided 50 minutes of non-face-to-face time during this encounter.   Alden Hipp, LCSW   THERAPIST PROGRESS NOTE  Session Time: 1100-1150  Participation Level: Active  Behavioral Response: NeatAlertAnxious  Type of Therapy: Individual Therapy  Treatment Goals addressed: Anxiety  Interventions: Supportive  Summary: Kelli Logan is a 58 y.o. female who presents with continued symptoms related to her diagnosis. Kelli Logan reports doing well since our last session. She reports continued medical problems that are being addressed through various tests. Kelli Logan reports feeling frustrated at the lack of information she has around these issues, and feels like "it's always something." LCSW encouraged Kelli Logan to utilize CBT skills to challenge negative thoughts like the one she articulated. This led to a discussion around how to challenge negative thoughts most effectively. LCSW explained how to "put the thought on trial." LCSW suggested Kelli Logan think about what she would tell a friend in a similar situation, and pointed out that we are always hardest on ourselves. Kelli Logan was able to accept this and expressed agreement. Kelli Logan went  on to discuss her living situation, and how the air conditioning unit does not cool her entire room, so she has a difficult time staying cool during the warmer months. We discussed ways to make the apartment cooler, such as getting blinds that block out the sun. Kelli Logan was open to suggestions from LCSW around this issue. Kelli Logan discussed a lot of events from her past that have contributed to her ending up in [place]. She reports not loving living here, but "I tell myself that I couldn't afford to live in [place] anyway." LCSW highlighted the way Kelli Logan was able to challenge her negative thought in that moment, and encouraged her to do that more often. Kelli Logan expressed understanding and agreement.   Suicidal/Homicidal: No  Therapist Response: Kelli Logan continues to work towards her tx goals but has not yet reached them. We are continuing to work on emotional regulation skills, challenging negative thoughts, and improving symptoms associated with PTSD.   Plan: Return again in 4 weeks.  Diagnosis: Axis I: Post Traumatic Stress Disorder    Axis II: No diagnosis    Alden Hipp, LCSW 06/19/2018

## 2018-06-20 ENCOUNTER — Ambulatory Visit: Payer: Medicare Other | Admitting: Internal Medicine

## 2018-06-23 ENCOUNTER — Encounter
Admission: RE | Admit: 2018-06-23 | Discharge: 2018-06-23 | Disposition: A | Discharge status: Home / Self Care | Payer: Medicare Other | Source: Ambulatory Visit | Attending: Gastroenterology | Admitting: Gastroenterology

## 2018-06-23 DIAGNOSIS — Z01812 Encounter for preprocedural laboratory examination: Secondary | ICD-10-CM | POA: Insufficient documentation

## 2018-06-23 DIAGNOSIS — F329 Major depressive disorder, single episode, unspecified: Secondary | ICD-10-CM | POA: Diagnosis not present

## 2018-06-23 DIAGNOSIS — Z1159 Encounter for screening for other viral diseases: Secondary | ICD-10-CM | POA: Insufficient documentation

## 2018-06-23 DIAGNOSIS — Z791 Long term (current) use of non-steroidal anti-inflammatories (NSAID): Secondary | ICD-10-CM | POA: Diagnosis not present

## 2018-06-23 DIAGNOSIS — K449 Diaphragmatic hernia without obstruction or gangrene: Secondary | ICD-10-CM | POA: Diagnosis not present

## 2018-06-23 DIAGNOSIS — Z87442 Personal history of urinary calculi: Secondary | ICD-10-CM | POA: Diagnosis not present

## 2018-06-23 DIAGNOSIS — G709 Myoneural disorder, unspecified: Secondary | ICD-10-CM | POA: Diagnosis not present

## 2018-06-23 DIAGNOSIS — I10 Essential (primary) hypertension: Secondary | ICD-10-CM | POA: Diagnosis not present

## 2018-06-23 DIAGNOSIS — K317 Polyp of stomach and duodenum: Secondary | ICD-10-CM | POA: Diagnosis not present

## 2018-06-23 DIAGNOSIS — M797 Fibromyalgia: Secondary | ICD-10-CM | POA: Diagnosis not present

## 2018-06-23 DIAGNOSIS — Z859 Personal history of malignant neoplasm, unspecified: Secondary | ICD-10-CM | POA: Diagnosis not present

## 2018-06-23 DIAGNOSIS — E079 Disorder of thyroid, unspecified: Secondary | ICD-10-CM | POA: Diagnosis not present

## 2018-06-23 DIAGNOSIS — K649 Unspecified hemorrhoids: Secondary | ICD-10-CM | POA: Diagnosis not present

## 2018-06-23 DIAGNOSIS — D124 Benign neoplasm of descending colon: Secondary | ICD-10-CM | POA: Diagnosis not present

## 2018-06-23 DIAGNOSIS — F419 Anxiety disorder, unspecified: Secondary | ICD-10-CM | POA: Diagnosis not present

## 2018-06-23 DIAGNOSIS — Z1211 Encounter for screening for malignant neoplasm of colon: Secondary | ICD-10-CM | POA: Diagnosis present

## 2018-06-23 DIAGNOSIS — M1990 Unspecified osteoarthritis, unspecified site: Secondary | ICD-10-CM | POA: Diagnosis not present

## 2018-06-23 DIAGNOSIS — R1310 Dysphagia, unspecified: Secondary | ICD-10-CM | POA: Diagnosis not present

## 2018-06-23 DIAGNOSIS — Z87891 Personal history of nicotine dependence: Secondary | ICD-10-CM | POA: Diagnosis not present

## 2018-06-23 DIAGNOSIS — Z79899 Other long term (current) drug therapy: Secondary | ICD-10-CM | POA: Diagnosis not present

## 2018-06-24 LAB — NOVEL CORONAVIRUS, NAA (HOSP ORDER, SEND-OUT TO REF LAB; TAT 18-24 HRS): SARS-CoV-2, NAA: NOT DETECTED

## 2018-06-25 ENCOUNTER — Encounter: Payer: Self-pay | Admitting: *Deleted

## 2018-06-26 ENCOUNTER — Ambulatory Visit
Admission: RE | Admit: 2018-06-26 | Discharge: 2018-06-26 | Disposition: A | Discharge status: Home / Self Care | Payer: Medicare Other | Attending: Gastroenterology | Admitting: Gastroenterology

## 2018-06-26 ENCOUNTER — Encounter
Admission: RE | Disposition: A | Discharge status: Home / Self Care | Payer: Self-pay | Source: Home / Self Care | Attending: Gastroenterology

## 2018-06-26 ENCOUNTER — Ambulatory Visit: Payer: Medicare Other | Admitting: Certified Registered Nurse Anesthetist

## 2018-06-26 DIAGNOSIS — K635 Polyp of colon: Secondary | ICD-10-CM

## 2018-06-26 DIAGNOSIS — K649 Unspecified hemorrhoids: Secondary | ICD-10-CM | POA: Diagnosis not present

## 2018-06-26 DIAGNOSIS — Z1211 Encounter for screening for malignant neoplasm of colon: Secondary | ICD-10-CM | POA: Diagnosis not present

## 2018-06-26 DIAGNOSIS — D124 Benign neoplasm of descending colon: Secondary | ICD-10-CM | POA: Diagnosis not present

## 2018-06-26 DIAGNOSIS — G709 Myoneural disorder, unspecified: Secondary | ICD-10-CM | POA: Insufficient documentation

## 2018-06-26 DIAGNOSIS — I10 Essential (primary) hypertension: Secondary | ICD-10-CM | POA: Insufficient documentation

## 2018-06-26 DIAGNOSIS — M1990 Unspecified osteoarthritis, unspecified site: Secondary | ICD-10-CM | POA: Insufficient documentation

## 2018-06-26 DIAGNOSIS — M797 Fibromyalgia: Secondary | ICD-10-CM | POA: Insufficient documentation

## 2018-06-26 DIAGNOSIS — F419 Anxiety disorder, unspecified: Secondary | ICD-10-CM | POA: Insufficient documentation

## 2018-06-26 DIAGNOSIS — Z87442 Personal history of urinary calculi: Secondary | ICD-10-CM | POA: Insufficient documentation

## 2018-06-26 DIAGNOSIS — R1310 Dysphagia, unspecified: Secondary | ICD-10-CM | POA: Insufficient documentation

## 2018-06-26 DIAGNOSIS — E079 Disorder of thyroid, unspecified: Secondary | ICD-10-CM | POA: Insufficient documentation

## 2018-06-26 DIAGNOSIS — K317 Polyp of stomach and duodenum: Secondary | ICD-10-CM | POA: Diagnosis not present

## 2018-06-26 DIAGNOSIS — Z1159 Encounter for screening for other viral diseases: Secondary | ICD-10-CM | POA: Insufficient documentation

## 2018-06-26 DIAGNOSIS — Z859 Personal history of malignant neoplasm, unspecified: Secondary | ICD-10-CM | POA: Insufficient documentation

## 2018-06-26 DIAGNOSIS — Z87891 Personal history of nicotine dependence: Secondary | ICD-10-CM | POA: Insufficient documentation

## 2018-06-26 DIAGNOSIS — R1319 Other dysphagia: Secondary | ICD-10-CM

## 2018-06-26 DIAGNOSIS — K449 Diaphragmatic hernia without obstruction or gangrene: Secondary | ICD-10-CM | POA: Diagnosis not present

## 2018-06-26 DIAGNOSIS — Z79899 Other long term (current) drug therapy: Secondary | ICD-10-CM | POA: Insufficient documentation

## 2018-06-26 DIAGNOSIS — Z791 Long term (current) use of non-steroidal anti-inflammatories (NSAID): Secondary | ICD-10-CM | POA: Insufficient documentation

## 2018-06-26 DIAGNOSIS — F329 Major depressive disorder, single episode, unspecified: Secondary | ICD-10-CM | POA: Insufficient documentation

## 2018-06-26 HISTORY — PX: COLONOSCOPY WITH PROPOFOL: SHX5780

## 2018-06-26 HISTORY — PX: ESOPHAGOGASTRODUODENOSCOPY (EGD) WITH PROPOFOL: SHX5813

## 2018-06-26 SURGERY — COLONOSCOPY WITH PROPOFOL
Anesthesia: General

## 2018-06-26 MED ORDER — MIDAZOLAM HCL 5 MG/5ML IJ SOLN
INTRAMUSCULAR | Status: DC | PRN
  Administered 2018-06-26: 2 mg via INTRAVENOUS

## 2018-06-26 MED ORDER — ONDANSETRON HCL 4 MG/2ML IJ SOLN
INTRAMUSCULAR | Status: AC
  Administered 2018-06-26: 4 mg
  Filled 2018-06-26: qty 2

## 2018-06-26 MED ORDER — GLYCOPYRROLATE 0.2 MG/ML IJ SOLN
INTRAMUSCULAR | Status: DC | PRN
  Administered 2018-06-26: 0.2 mg via INTRAVENOUS

## 2018-06-26 MED ORDER — LIDOCAINE HCL URETHRAL/MUCOSAL 2 % EX GEL
CUTANEOUS | Status: AC
  Filled 2018-06-26: qty 5

## 2018-06-26 MED ORDER — ONDANSETRON HCL 4 MG/2ML IJ SOLN: 4.0000 mg | Freq: Once | INTRAMUSCULAR | Status: DC

## 2018-06-26 MED ORDER — MIDAZOLAM HCL 2 MG/2ML IJ SOLN
INTRAMUSCULAR | Status: AC
  Filled 2018-06-26: qty 2

## 2018-06-26 MED ORDER — PROPOFOL 500 MG/50ML IV EMUL
INTRAVENOUS | Status: DC | PRN
  Administered 2018-06-26: 120 ug/kg/min via INTRAVENOUS

## 2018-06-26 MED ORDER — PROPOFOL 500 MG/50ML IV EMUL
INTRAVENOUS | Status: AC
  Filled 2018-06-26: qty 50

## 2018-06-26 MED ORDER — PROPOFOL 10 MG/ML IV BOLUS
INTRAVENOUS | Status: DC | PRN
  Administered 2018-06-26: 70 mg via INTRAVENOUS

## 2018-06-26 MED ORDER — SODIUM CHLORIDE 0.9 % IV SOLN
INTRAVENOUS | Status: DC
  Administered 2018-06-26: 10:00:00 via INTRAVENOUS

## 2018-06-26 NOTE — Op Note (Signed)
[facility] Gastroenterology Patient Name: Kelli Logan Procedure Date: 06/26/2018 10:47 AM MRN: [id number] Account #: [id number] Date of Birth: Feb 11, 1960 Admit Type: Outpatient Age: 58 Room: [facility] ENDO ROOM 3 Gender: Female Note Status: Finalized Procedure:            Colonoscopy Indications:          Screening for colorectal malignant neoplasm Providers:            Jalisia Puchalski B. Bonna Gains MD, MD Medicines:            Monitored Anesthesia Care Complications:        No immediate complications. Procedure:            Pre-Anesthesia Assessment:                       - ASA Grade Assessment: II - A patient with mild                        systemic disease.                       - Prior to the procedure, a History and Physical was                        performed, and patient medications, allergies and                        sensitivities were reviewed. The patient's tolerance of                        previous anesthesia was reviewed.                       - The risks and benefits of the procedure and the                        sedation options and risks were discussed with the                        patient. All questions were answered and informed                        consent was obtained.                       - Patient identification and proposed procedure were                        verified prior to the procedure by the physician, the                        nurse, the anesthesiologist, the anesthetist and the                        technician. The procedure was verified in the procedure                        room.                       After obtaining informed consent, the colonoscope was  passed under direct vision. Throughout the procedure,                        the patient's blood pressure, pulse, and oxygen                        saturations were monitored continuously. The                        Colonoscope was introduced through  the anus and                        advanced to the the cecum, identified by appendiceal                        orifice and ileocecal valve. The colonoscopy was                        performed with ease. The patient tolerated the                        procedure well. The quality of the bowel preparation                        was good. Findings:      The perianal and digital rectal examinations were normal.      Two flat polyps were found in the descending colon. The polyps were 3 to       4 mm in size. These polyps were removed with a cold biopsy forceps.       Resection and retrieval were complete.      The exam was otherwise without abnormality.      The rectum, sigmoid colon, descending colon, transverse colon, ascending       colon and cecum appeared normal.      Non-bleeding hemorrhoids were found during retroflexion. Impression:           - Two 3 to 4 mm polyps in the descending colon, removed                        with a cold biopsy forceps. Resected and retrieved.                       - The examination was otherwise normal.                       - The rectum, sigmoid colon, descending colon,                        transverse colon, ascending colon and cecum are normal.                       - Non-bleeding hemorrhoids. Recommendation:       - Discharge patient to home (with escort).                       - Advance diet as tolerated.                       - Continue present medications.                       -  Await pathology results.                       - Repeat colonoscopy in 5 years.                       - The findings and recommendations were discussed with                        the patient.                       - The findings and recommendations were discussed with                        the patient's family.                       - Return to primary care physician as previously                        scheduled.                       - High fiber diet. Procedure  Code(s):    --- Professional ---                       [phone], Colonoscopy, flexible; with biopsy, single or                        multiple Diagnosis Code(s):    --- Professional ---                       Z12.11, Encounter for screening for malignant neoplasm                        of colon                       K63.5, Polyp of colon                       K64.9, Unspecified hemorrhoids CPT copyright 2019 American Medical Association. All rights reserved. The codes documented in this report are preliminary and upon coder review may  be revised to meet current compliance requirements.  [name], MD Margretta Sidle B. Bonna Gains MD, MD 06/26/2018 11:46:11 AM This report has been signed electronically. Number of Addenda: 0 Note Initiated On: 06/26/2018 10:47 AM Scope Withdrawal Time: 0 hours 15 minutes 39 seconds  Total Procedure Duration: 0 hours 27 minutes 40 seconds  Estimated Blood Loss: Estimated blood loss: none.      [facility]

## 2018-06-26 NOTE — Op Note (Signed)
[facility] Gastroenterology Patient Name: Kelli Logan Procedure Date: 06/26/2018 10:48 AM MRN: [id number] Account #: [id number] Date of Birth: 10/04/1960 Admit Type: Outpatient Age: 58 Room: [facility] ENDO ROOM 3 Gender: Female Note Status: Finalized Procedure:            Upper GI endoscopy Indications:          Dysphagia, Suspected esophageal reflux Providers:            Kyana Aicher B. Bonna Gains MD, MD Medicines:            Monitored Anesthesia Care Complications:        No immediate complications. Procedure:            Pre-Anesthesia Assessment:                       - Prior to the procedure, a History and Physical was                        performed, and patient medications, allergies and                        sensitivities were reviewed. The patient's tolerance of                        previous anesthesia was reviewed.                       - The risks and benefits of the procedure and the                        sedation options and risks were discussed with the                        patient. All questions were answered and informed                        consent was obtained.                       - Patient identification and proposed procedure were                        verified prior to the procedure by the physician, the                        nurse, the anesthesiologist, the anesthetist and the                        technician. The procedure was verified in the procedure                        room.                       - ASA Grade Assessment: II - A patient with mild                        systemic disease.                       After obtaining informed consent, the endoscope was  passed under direct vision. Throughout the procedure,                        the patient's blood pressure, pulse, and oxygen                        saturations were monitored continuously. The Endoscope                        was introduced through the  mouth, and advanced to the                        second part of duodenum. The upper GI endoscopy was                        accomplished with ease. The patient tolerated the                        procedure well. Findings:      The examined esophagus was normal. Biopsies were obtained from the       proximal and distal esophagus with cold forceps for histology of       suspected eosinophilic esophagitis.      The entire examined stomach was normal.      A small hiatal hernia was present.      A few 2 to 4 mm sessile polyps with no bleeding and no stigmata of       recent bleeding were found in the gastric body. Biopsies were taken with       a cold forceps for histology.      The duodenal bulb, second portion of the duodenum and examined duodenum       were normal. Impression:           - Normal esophagus. Biopsied.                       - Normal stomach.                       - Small hiatal hernia.                       - A few gastric polyps. Biopsied.                       - Normal duodenal bulb, second portion of the duodenum                        and examined duodenum. Recommendation:       - Await pathology results.                       - Discharge patient to home (with escort).                       - Advance diet as tolerated.                       - Continue present medications.                       - Patient has a contact number available for  emergencies. The signs and symptoms of potential                        delayed complications were discussed with the patient.                        Return to normal activities tomorrow. Written discharge                        instructions were provided to the patient.                       - Discharge patient to home (with escort).                       - The findings and recommendations were discussed with                        the patient.                       - The findings and recommendations were  discussed with                        the patient's family. Procedure Code(s):    --- Professional ---                       [phone], Esophagogastroduodenoscopy, flexible, transoral;                        with biopsy, single or multiple Diagnosis Code(s):    --- Professional ---                       K44.9, Diaphragmatic hernia without obstruction or                        gangrene                       K31.7, Polyp of stomach and duodenum                       R13.10, Dysphagia, unspecified CPT copyright 2019 American Medical Association. All rights reserved. The codes documented in this report are preliminary and upon coder review may  be revised to meet current compliance requirements.  [name], MD Margretta Sidle B. Bonna Gains MD, MD 06/26/2018 11:08:04 AM This report has been signed electronically. Number of Addenda: 0 Note Initiated On: 06/26/2018 10:48 AM Estimated Blood Loss: Estimated blood loss: none.      [facility]

## 2018-06-26 NOTE — Anesthesia Postprocedure Evaluation (Signed)
Anesthesia Post Note  Patient: Kelli Logan  Procedure(s) Performed: COLONOSCOPY WITH PROPOFOL (N/A ) ESOPHAGOGASTRODUODENOSCOPY (EGD) WITH PROPOFOL (N/A )  Patient location during evaluation: Endoscopy Anesthesia Type: General Level of consciousness: awake and alert Pain management: pain level controlled Vital Signs Assessment: post-procedure vital signs reviewed and stable Respiratory status: spontaneous breathing and respiratory function stable Cardiovascular status: stable Anesthetic complications: no     Last Vitals:  Vitals:   06/26/18 1143 06/26/18 1144  BP: 95/65 95/65  Pulse: 72 72  Resp:  17  Temp: (!) 36.2 C (!) 36.1 C  SpO2: 98% 98%    Last Pain:  Vitals:   06/26/18 1143  TempSrc: Tympanic  PainSc: 0-No pain                 [name] K

## 2018-06-26 NOTE — Transfer of Care (Signed)
Immediate Anesthesia Transfer of Care Note  Patient: Kelli Logan  Procedure(s) Performed: COLONOSCOPY WITH PROPOFOL (N/A ) ESOPHAGOGASTRODUODENOSCOPY (EGD) WITH PROPOFOL (N/A )  Patient Location: Endoscopy Unit  Anesthesia Type:General  Level of Consciousness: awake  Airway & Oxygen Therapy: Patient Spontanous Breathing  Post-op Assessment: Report given to RN and Post -op Vital signs reviewed and stable  Post vital signs: Reviewed  Last Vitals:  Vitals Value Taken Time  BP 95/65 06/26/18 1144  Temp 36.1 C 06/26/18 1144  Pulse 72 06/26/18 1144  Resp 13 06/26/18 1144  SpO2 99 % 06/26/18 1144  Vitals shown include unvalidated device data.  Last Pain:  Vitals:   06/26/18 1018  TempSrc: Tympanic  PainSc: 0-No pain         Complications: No apparent anesthesia complications

## 2018-06-26 NOTE — Anesthesia Post-op Follow-up Note (Signed)
Anesthesia QCDR form completed.        
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
## 2018-06-26 NOTE — H&P (Signed)
[name], MD [address], [place], [place], [place], [address] [address], [place], [place], [place], [address] Phone: [phone]  Fax: [phone]  Primary Care Physician:  Gennette Pac, FNP   Pre-Procedure History & Physical: HPI:  Kelli Logan is a 58 y.o. female is here for a colonoscopy and EGD.   Past Medical History:  Diagnosis Date  . Anxiety   . Arthritis   . Cancer ([place])   . Depression   . Fibromyalgia   . History of kidney stones    h/o  . Hypertension   . Thyroid disease     Past Surgical History:  Procedure Laterality Date  . APPENDECTOMY    . BREAST BIOPSY Left few yrs ago   stereotactic bx in [place], benign  . BREAST BIOPSY Left 12/26/2017   Affirm Bx IMC- X-Clip  . BREAST BIOPSY Left 01/14/2018   Affirm Bx- Coil clip- path pending  . BREAST RECONSTRUCTION WITH PLACEMENT OF TISSUE EXPANDER AND ALLODERM Left 04/08/2018   Procedure: LEFT BREAST IMMEDIATE  RECONSTRUCTION WITH EXPANDER AND FLEX HD;  Surgeon: Wallace Going, DO;  Location: [facility] ORS;  Service: Plastics;  Laterality: Left;  . FOOT BONE EXCISION    . MASTECTOMY    . MASTECTOMY W/ SENTINEL NODE BIOPSY Left 04/08/2018   Procedure: MASTECTOMY WITH SENTINEL LYMPH NODE BIOPSY LEFT;  Surgeon: Fredirick Maudlin, MD;  Location: [facility] ORS;  Service: General;  Laterality: Left;  . [place]    Prior to Admission medications   Medication Sig Start Date End Date Taking? Authorizing Provider  atorvastatin (LIPITOR) 40 MG tablet Take 40 mg by mouth at bedtime.    Yes [provider]  Biotin 5 MG TABS Take 5 mg by mouth daily.   Yes [provider]  Chromium Picolinate (CHROMIUM PICOLATE PO) Take 1,000 mcg by mouth daily.   Yes [provider]  citalopram (CELEXA) 40 MG tablet Take 40 mg by mouth every morning.    Yes [provider]  doxazosin (CARDURA) 1 MG tablet Take 1 tablet (1 mg total) by mouth 2 (two) times daily.  06/09/18  Yes Minna Merritts, MD  Glucosamine-Chondroitin 1500-1200 MG/30ML LIQD Take 2 tablets by mouth daily. 02/17/18  Yes [provider]  hydrochlorothiazide (HYDRODIURIL) 25 MG tablet Take 25 mg by mouth daily.  10/12/17  Yes [provider]  hydrOXYzine (ATARAX/VISTARIL) 25 MG tablet TAKE 1 TABLET (25 MG TOTAL) BY MOUTH 3 (THREE) TIMES DAILY AS NEEDED FOR ANXIETY. 06/16/18  Yes Ursula Alert, MD  letrozole (FEMARA) 2.5 MG tablet Take 1 tablet (2.5 mg total) by mouth daily. 05/09/18  Yes Cammie Sickle, MD  levothyroxine (SYNTHROID, LEVOTHROID) 50 MCG tablet Take 50 mcg by mouth every other day.  11/07/17  Yes [provider]  levothyroxine (SYNTHROID, LEVOTHROID) 75 MCG tablet Take 75 mcg by mouth every other day.   Yes [provider]  lisinopril (ZESTRIL) 40 MG tablet Take 40 mg by mouth daily.   Yes [provider]  metoprolol (LOPRESSOR) 50 MG tablet Take 1 tablet (50 mg total) by mouth 2 (two) times daily. Patient taking differently: Take 75 mg by mouth 2 (two) times daily.  03/16/16  Yes Gregor Hams, MD  nortriptyline (PAMELOR) 10 MG capsule Take 30 mg by mouth at bedtime.  03/17/18  Yes [provider]  omeprazole (PRILOSEC) 20 MG capsule Take 20 mg by mouth every morning.  11/07/17  Yes [provider]  diazepam (VALIUM) 2 MG tablet Take 1 tablet (2 mg total) by mouth every 12 (twelve) hours as needed for anxiety or muscle spasms. 04/28/18 04/28/19  Dillingham, Loel Lofty, DO  HYDROcodone-acetaminophen (NORCO) 5-325 MG tablet Take 1 tablet by mouth every 6 (six) hours as needed for moderate pain. 04/01/18   Dillingham, Loel Lofty, DO  naproxen (NAPROSYN) 500 MG tablet Take 500 mg by mouth 2 (two) times daily as needed for moderate pain.    [provider]    Allergies as of 06/13/2018  . (No Known Allergies)    Family History  Problem Relation Age of Onset  . Hypertension Mother   . Lung cancer Maternal  Grandmother   . Heart disease Father   . Alcohol abuse Father   . Breast cancer Neg Hx   . Colon cancer Neg Hx     Social History   Socioeconomic History  . Marital status: Divorced    Spouse name: Not on file  . Number of children: 1  . Years of education: Not on file  . Highest education level: High school graduate  Occupational History  . Not on file  Social Needs  . Financial resource strain: Very hard  . Food insecurity    Worry: Often true    Inability: Often true  . Transportation needs    Medical: Yes    Non-medical: Yes  Tobacco Use  . Smoking status: Former Smoker    Packs/day: 1.00    Years: 2.00    Pack years: 2.00    Types: Cigarettes    Quit date: 01/06/2017    Years since quitting: 1.4  . Smokeless tobacco: Never Used  Substance and Sexual Activity  . Alcohol use: Yes    Comment: rare beer   . Drug use: Not Currently  . Sexual activity: Not on file  Lifestyle  . Physical activity    Days per week: 0 days    Minutes per session: 0 min  . Stress: Very much  Relationships  . Social Herbalist on phone: Not on file    Gets together: Not on file    Attends religious service: Never    Active member of club or organization: No    Attends meetings of clubs or organizations: Never    Relationship status: Divorced  . Intimate partner violence    Fear of current or ex partner: No    Emotionally abused: No    Physically abused: No    Forced sexual activity: No  Other Topics Concern  . 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 Quit smoking; ocassional alcohol.     Review of Systems: See HPI, otherwise negative ROS  Physical Exam: There were no vitals taken for this visit. General:   Alert,  pleasant and cooperative in NAD Head:  Normocephalic and atraumatic. Neck:  Supple; no masses or thyromegaly. Lungs:  Clear  throughout to auscultation, normal respiratory effort.    Heart:  +S1, +S2, Regular rate and rhythm, No edema. Abdomen:  Soft, nontender and nondistended. Normal bowel sounds, without guarding, and without rebound.   Neurologic:  Alert and  oriented x4;  grossly normal neurologically.  Impression/Plan: Kelli Logan is here for a colonoscopy to be performed for average risk screening and EGD for Acid Reflux, dysphagia.  Risks, benefits, limitations, and alternatives regarding the procedures have been reviewed with the  patient.  Questions have been answered.  All parties agreeable.   Virgel Manifold, MD  06/26/2018, 10:15 AM

## 2018-06-27 ENCOUNTER — Encounter: Payer: Self-pay | Admitting: Gastroenterology

## 2018-06-27 LAB — SURGICAL PATHOLOGY

## 2018-06-30 ENCOUNTER — Encounter: Payer: Self-pay | Admitting: Gastroenterology

## 2018-06-30 ENCOUNTER — Telehealth: Payer: Self-pay | Admitting: Plastic Surgery

## 2018-06-30 NOTE — Telephone Encounter (Signed)

## 2018-07-01 ENCOUNTER — Ambulatory Visit (INDEPENDENT_AMBULATORY_CARE_PROVIDER_SITE_OTHER): Payer: Medicare Other | Admitting: Nurse Practitioner

## 2018-07-01 ENCOUNTER — Other Ambulatory Visit: Payer: Self-pay

## 2018-07-01 ENCOUNTER — Encounter: Payer: Self-pay | Admitting: Plastic Surgery

## 2018-07-01 VITALS — BP 138/77 | HR 83 | Temp 98.3°F | Ht 66.0 in | Wt 192.8 lb

## 2018-07-01 DIAGNOSIS — Z9012 Acquired absence of left breast and nipple: Secondary | ICD-10-CM

## 2018-07-01 NOTE — Progress Notes (Signed)
Patient ID: Libia Fazzini, female    DOB: [birth year], 59 y.o.   MRN: [id number]   Chief Complaint  Patient presents with  . Follow-up    2 weeks    Cleaster is a 58 yo female s/p left mastectomy and expander placement on 04/08/18. She presents today for follow up and expander filling. She is doing well overall. She is a little worried about the creasing under the left breast. She is hopefull her breast will be more symmetric after surgery.    Review of Systems  Constitutional: Negative.   HENT: Negative.   Respiratory: Negative.   Cardiovascular: Negative.   Gastrointestinal:       Decrease in appetite  Genitourinary: Negative.   Musculoskeletal: Negative.   Skin: Negative.     Past Medical History:  Diagnosis Date  . Anxiety   . Arthritis   . Cancer ([place])   . Depression   . Fibromyalgia   . History of kidney stones    h/o  . Hypertension   . Thyroid disease     Past Surgical History:  Procedure Laterality Date  . APPENDECTOMY    . BREAST BIOPSY Left few yrs ago   stereotactic bx in [place], benign  . BREAST BIOPSY Left 12/26/2017   Affirm Bx IMC- X-Clip  . BREAST BIOPSY Left 01/14/2018   Affirm Bx- Coil clip- path pending  . BREAST RECONSTRUCTION WITH PLACEMENT OF TISSUE EXPANDER AND ALLODERM Left 04/08/2018   Procedure: LEFT BREAST IMMEDIATE  RECONSTRUCTION WITH EXPANDER AND FLEX HD;  Surgeon: Wallace Going, DO;  Location: [facility];  Service: Plastics;  Laterality: Left;  . COLONOSCOPY WITH PROPOFOL N/A 06/26/2018   Procedure: COLONOSCOPY WITH PROPOFOL;  Surgeon: Virgel Manifold, MD;  Location: [facility];  Service: Endoscopy;  Laterality: N/A;  . ESOPHAGOGASTRODUODENOSCOPY (EGD) WITH PROPOFOL N/A 06/26/2018   Procedure: ESOPHAGOGASTRODUODENOSCOPY (EGD) WITH PROPOFOL;  Surgeon: Virgel Manifold, MD;  Location: [facility];  Service: Endoscopy;  Laterality: N/A;  . FOOT BONE EXCISION    . MASTECTOMY    . MASTECTOMY W/ SENTINEL NODE BIOPSY Left  04/08/2018   Procedure: MASTECTOMY WITH SENTINEL LYMPH NODE BIOPSY LEFT;  Surgeon: Fredirick Maudlin, MD;  Location: [facility];  Service: General;  Laterality: Left;  . [place] and 1985      Current Outpatient Medications:  .  atorvastatin (LIPITOR) 40 MG tablet, Take 40 mg by mouth at bedtime. , Disp: , Rfl:  .  Biotin 5 MG TABS, Take 5 mg by mouth daily., Disp: , Rfl:  .  Chromium Picolinate (CHROMIUM PICOLATE PO), Take 1,000 mcg by mouth daily., Disp: , Rfl:  .  citalopram (CELEXA) 40 MG tablet, Take 40 mg by mouth every morning. , Disp: , Rfl:  .  doxazosin (CARDURA) 1 MG tablet, Take 1 tablet (1 mg total) by mouth 2 (two) times daily., Disp: 180 tablet, Rfl: 3 .  Glucosamine-Chondroitin 1500-1200 MG/30ML LIQD, Take 2 tablets by mouth daily., Disp: , Rfl:  .  hydrochlorothiazide (HYDRODIURIL) 25 MG tablet, Take 25 mg by mouth daily. , Disp: , Rfl:  .  HYDROcodone-acetaminophen (NORCO) 5-325 MG tablet, Take 1 tablet by mouth every 6 (six) hours as needed for moderate pain., Disp: 30 tablet, Rfl: 0 .  hydrOXYzine (ATARAX/VISTARIL) 25 MG tablet, TAKE 1 TABLET (25 MG TOTAL) BY MOUTH 3 (THREE) TIMES DAILY AS NEEDED FOR ANXIETY., Disp: 90 tablet, Rfl: 0 .  levothyroxine (SYNTHROID, LEVOTHROID) 50 MCG tablet, Take 50 mcg  by mouth every other day. , Disp: , Rfl:  .  levothyroxine (SYNTHROID, LEVOTHROID) 75 MCG tablet, Take 75 mcg by mouth every other day., Disp: , Rfl:  .  lisinopril (ZESTRIL) 20 MG tablet, Take 20 mg by mouth daily., Disp: , Rfl:  .  metoprolol (LOPRESSOR) 50 MG tablet, Take 1 tablet (50 mg total) by mouth 2 (two) times daily. (Patient taking differently: Take 75 mg by mouth 2 (two) times daily. ), Disp: 60 tablet, Rfl: 0 .  naproxen (NAPROSYN) 500 MG tablet, Take 500 mg by mouth 2 (two) times daily as needed for moderate pain., Disp: , Rfl:  .  omeprazole (PRILOSEC) 20 MG capsule, Take 20 mg by mouth every morning. , Disp: , Rfl:  .  QUEtiapine (SEROQUEL) 50 MG  tablet, Take 50 mg by mouth at bedtime., Disp: , Rfl:    Objective:   Vitals:   07/01/18 1254  BP: 138/77  Pulse: 83  Temp: 98.3 F (36.8 C)  SpO2: 97%    Physical Exam  General: alert, calm, no acute distress Chest: symmetrical rise and fall Lungs: unlabored breathing Breast: left mastectomy with healing surgical incision, expander in place creasing of skin on inferior medial aspect of left breast;ptosis of right breast Cardiac: +2 bilateral radial pulse Musculoskeletal: MAEx4 Neuro: A&O x3, calm, cooperative, steady gait  Assessment & Plan:  Babetta Paterson is a 58 yo female s/p left mastectomy with expander placement. She is scheduled for removal of tissue expander, placement of implant, and right mastopexy on 7/27. Pictures placed in chart at last visit. She was encouraged to start a healthy diet of increased protein and decreased sugar and carbs. Return for pre-op H&P.   We placed injectable saline in the Expander using a sterile technique: Left: 50 cc for a total of 600 / 585 cc     Jeven Topper Dozier-Lineberger, NP

## 2018-07-03 ENCOUNTER — Other Ambulatory Visit: Payer: Medicare Other

## 2018-07-04 ENCOUNTER — Telehealth: Payer: Self-pay | Admitting: Internal Medicine

## 2018-07-04 NOTE — Progress Notes (Signed)
[facility] Consultation      Assessment and Plan:  Dyspnea on exertion. - Suspect multifactorial, possibly due to asthma/allergies/postnasal drip, in addition may be related to deconditioning related to obesity and atelectasis.  Asthma. - Patient has possible asthma, she is currently using Wixella though not really compliant, discussed the importance of using it twice daily. - We will send for PFT.  Deconditioning, obesity, atelectasis. - Discussed the importance of increasing physical activity, I recommended 20 to 30 minutes/day of walking. -Weight loss recommended.  Excessive daytime sleepiness. - We will send for sleep study, start on CPAP as indicated.  Orders Placed This Encounter  Procedures  . Pulmonary Function Test [facility] Only  . Home sleep test   Return in about 3 months (around 10/07/2018).   Date: 07/07/2018  MRN# [id number] Kelli Logan Apr 01, 1958  Referring Physician: Dr. Pryor Ochoa for dyspnea and sleepiness.  See Beharry is a 58 y.o. old female seen in consultation for chief complaint of:    Chief Complaint  Patient presents with  . Consult    SOB when bends over, Difficulty sleeping and wakes up exhausted. Has been told that she snores. Has post nasal drip .    HPI:  Kelli Logan is a 58 y.o. old female with complaints of daytime sleepiness, also dyspnea when bending over. She is here with breathing issues as well as sleeping. She tells me that she was taking abx several months, after her cut bit her. She got a cold after that which led to post nasal drip, this led to dyspnea with minimal activity. She goes to [place] but has to stop.  She has a cat, in bed with her and Dr. Pryor Ochoa has ordered allergy testing.  She has gone up in weight about 30 pounds the past few years.  She is using wixella, on about half the time she forgets to use the 2nd dose about half the days.   **Desat walk 07/07/18 baseline sat at rest on RA was 97% and HR  80. Walked a total of 360 feet with minimal dyspnea, pt was convesational. Sat was 97% and HR 80.   **Chest x-ray 04/13/2018>> strandy left lung atelectasis, right basilar atelectasis. **CBC 04/12/2018>> AEC 300.   PMHX:   Past Medical History:  Diagnosis Date  . Anxiety   . Arthritis   . Cancer ([place])   . Depression   . Fibromyalgia   . History of kidney stones    h/o  . Hypertension   . Thyroid disease    Surgical Hx:  Past Surgical History:  Procedure Laterality Date  . APPENDECTOMY    . BREAST BIOPSY Left few yrs ago   stereotactic bx in [place], benign  . BREAST BIOPSY Left 12/26/2017   Affirm Bx IMC- X-Clip  . BREAST BIOPSY Left 01/14/2018   Affirm Bx- Coil clip- path pending  . BREAST RECONSTRUCTION WITH PLACEMENT OF TISSUE EXPANDER AND ALLODERM Left 04/08/2018   Procedure: LEFT BREAST IMMEDIATE  RECONSTRUCTION WITH EXPANDER AND FLEX HD;  Surgeon: Wallace Going, DO;  Location: [facility];  Service: Plastics;  Laterality: Left;  . COLONOSCOPY WITH PROPOFOL N/A 06/26/2018   Procedure: COLONOSCOPY WITH PROPOFOL;  Surgeon: Virgel Manifold, MD;  Location: [facility];  Service: Endoscopy;  Laterality: N/A;  . ESOPHAGOGASTRODUODENOSCOPY (EGD) WITH PROPOFOL N/A 06/26/2018   Procedure: ESOPHAGOGASTRODUODENOSCOPY (EGD) WITH PROPOFOL;  Surgeon: Virgel Manifold, MD;  Location: [facility];  Service: Endoscopy;  Laterality: N/A;  . FOOT BONE EXCISION    .  MASTECTOMY    . MASTECTOMY W/ SENTINEL NODE BIOPSY Left 04/08/2018   Procedure: MASTECTOMY WITH SENTINEL LYMPH NODE BIOPSY LEFT;  Surgeon: Fredirick Maudlin, MD;  Location: [facility];  Service: General;  Laterality: Left;  . UMBILICAL HERNIA REPAIR  1983 and 1985   Family Hx:  Family History  Problem Relation Age of Onset  . Hypertension Mother   . Lung cancer Maternal Grandmother   . Heart disease Father   . Alcohol abuse Father   . Breast cancer Neg Hx   . Colon cancer Neg Hx    Social Hx:   Social History    Tobacco Use  . Smoking status: Former Smoker    Packs/day: 1.00    Years: 2.00    Pack years: 2.00    Types: Cigarettes    Quit date: 01/06/2017    Years since quitting: 1.4  . Smokeless tobacco: Never Used  Substance Use Topics  . Alcohol use: Yes    Comment: rare beer   . Drug use: Not Currently   Medication:    Current Outpatient Medications:  .  atorvastatin (LIPITOR) 40 MG tablet, Take 40 mg by mouth at bedtime. , Disp: , Rfl:  .  Biotin 5 MG TABS, Take 5 mg by mouth daily., Disp: , Rfl:  .  Chromium Picolinate (CHROMIUM PICOLATE PO), Take 1,000 mcg by mouth daily., Disp: , Rfl:  .  citalopram (CELEXA) 40 MG tablet, Take 40 mg by mouth every morning. , Disp: , Rfl:  .  doxazosin (CARDURA) 1 MG tablet, Take 1 tablet (1 mg total) by mouth 2 (two) times daily., Disp: 180 tablet, Rfl: 3 .  Fluticasone-Salmeterol (WIXELA INHUB) 250-50 MCG/DOSE AEPB, Inhale 1 puff into the lungs 2 (two) times daily., Disp: , Rfl:  .  Glucosamine-Chondroitin 1500-1200 MG/30ML LIQD, Take 2 tablets by mouth daily., Disp: , Rfl:  .  hydrochlorothiazide (HYDRODIURIL) 25 MG tablet, Take 25 mg by mouth daily. , Disp: , Rfl:  .  hydrOXYzine (ATARAX/VISTARIL) 25 MG tablet, TAKE 1 TABLET (25 MG TOTAL) BY MOUTH 3 (THREE) TIMES DAILY AS NEEDED FOR ANXIETY., Disp: 90 tablet, Rfl: 0 .  levothyroxine (SYNTHROID, LEVOTHROID) 50 MCG tablet, Take 50 mcg by mouth every other day. , Disp: , Rfl:  .  levothyroxine (SYNTHROID, LEVOTHROID) 75 MCG tablet, Take 75 mcg by mouth every other day., Disp: , Rfl:  .  lisinopril (ZESTRIL) 20 MG tablet, Take 20 mg by mouth daily., Disp: , Rfl:  .  metoprolol (LOPRESSOR) 50 MG tablet, Take 1 tablet (50 mg total) by mouth 2 (two) times daily. (Patient taking differently: Take 75 mg by mouth 2 (two) times daily. ), Disp: 60 tablet, Rfl: 0 .  naproxen (NAPROSYN) 500 MG tablet, Take 500 mg by mouth 2 (two) times daily as needed for moderate pain., Disp: , Rfl:  .  omeprazole (PRILOSEC) 20  MG capsule, Take 20 mg by mouth every morning. , Disp: , Rfl:  .  QUEtiapine (SEROQUEL) 50 MG tablet, Take 50 mg by mouth at bedtime., Disp: , Rfl:  .  HYDROcodone-acetaminophen (NORCO) 5-325 MG tablet, Take 1 tablet by mouth every 6 (six) hours as needed for moderate pain., Disp: 30 tablet, Rfl: 0   Allergies:  Patient has no known allergies.  Review of Systems: Gen:  Denies  fever, sweats, chills HEENT: Denies blurred vision, double vision. bleeds, sore throat Cvc:  No dizziness, chest pain. Resp:   Denies cough or sputum production, shortness of breath Gi: Denies swallowing  difficulty, stomach pain. Gu:  Denies bladder incontinence, burning urine Ext:   No Joint pain, stiffness. Skin: No skin rash,  hives  Endoc:  No polyuria, polydipsia. Psych: No depression, insomnia. Other:  All other systems were reviewed with the patient and were negative other that what is mentioned in the HPI.   Physical Examination:   VS: BP 140/78 (BP Location: Right Arm, Cuff Size: Normal)   Temp 98.5 F (36.9 C) (Skin)   Ht 5\' 6"  (1.676 m)   Wt 193 lb (87.5 kg)   SpO2 96%   BMI 31.15 kg/m   General Appearance: No distress  Neuro:without focal findings,  speech normal,  HEENT: PERRLA, EOM intact.   Pulmonary: normal breath sounds, No wheezing.  CardiovascularNormal S1,S2.  No m/r/g.   Abdomen: Benign, Soft, non-tender. Renal:  No costovertebral tenderness  GU:  No performed at this time. Endoc: No evident thyromegaly, no signs of acromegaly. Skin:   warm, no rashes, no ecchymosis  Extremities: normal, no cyanosis, clubbing.  Other findings:    LABORATORY PANEL:   CBC No results for input(s): WBC, HGB, HCT, PLT in the last 168 hours. ------------------------------------------------------------------------------------------------------------------  Chemistries  No results for input(s): NA, K, CL, CO2, GLUCOSE, BUN, CREATININE, CALCIUM, MG, AST, ALT, ALKPHOS, BILITOT in the last 168  hours.  Invalid input(s): GFRCGP ------------------------------------------------------------------------------------------------------------------  Cardiac Enzymes No results for input(s): TROPONINI in the last 168 hours. ------------------------------------------------------------  RADIOLOGY:  No results found.     Thank  you for the consultation and for allowing [facility] to assist in the care of your patient. Our recommendations are noted above.  Please contact us if we can be of further service.   Marda Stalker, M.D., F.C.C.P.  Board Certified in Internal Medicine, Pulmonary Medicine, [place], and Sleep Medicine.  [facility] Office Number: [phone]   07/07/2018

## 2018-07-04 NOTE — Telephone Encounter (Signed)
Called patient for COVID-19 pre-screening for in office visit.  Have you recently traveled any where out of the local area in the last 2 weeks? No  Have you been in close contact with a person diagnosed with COVID-19 within the last 2 weeks? No  Do you currently have any of the following symptoms? If so, when did they start?  Cough     Diarrhea   Joint Pain Fever      Muscle Pain   Red eyes Shortness of breath (Yes- for years)     Abdominal pain Vomiting Loss of smell    Rash    Sore Throat Headache    Weakness   Bruising or bleeding  Okay to proceed with visit 07/07/2018

## 2018-07-07 ENCOUNTER — Ambulatory Visit (INDEPENDENT_AMBULATORY_CARE_PROVIDER_SITE_OTHER): Payer: Medicare Other | Admitting: Internal Medicine

## 2018-07-07 ENCOUNTER — Other Ambulatory Visit: Payer: Self-pay

## 2018-07-07 ENCOUNTER — Encounter: Payer: Self-pay | Admitting: Internal Medicine

## 2018-07-07 VITALS — BP 140/78 | Temp 98.5°F | Ht 66.0 in | Wt 193.0 lb

## 2018-07-07 DIAGNOSIS — R0609 Other forms of dyspnea: Secondary | ICD-10-CM

## 2018-07-07 DIAGNOSIS — G4719 Other hypersomnia: Secondary | ICD-10-CM

## 2018-07-07 NOTE — Patient Instructions (Addendum)
Remember to use wixela inhaler twice daily.  Will check lung function test.  Try to increase your physical activity and decrease your weight.  Try to walk at least 20 minutes per day.

## 2018-07-09 ENCOUNTER — Telehealth: Payer: Self-pay | Admitting: Internal Medicine

## 2018-07-10 ENCOUNTER — Inpatient Hospital Stay: Payer: Medicare Other | Admitting: Internal Medicine

## 2018-07-10 ENCOUNTER — Other Ambulatory Visit: Payer: Self-pay | Admitting: Psychiatry

## 2018-07-10 DIAGNOSIS — F4310 Post-traumatic stress disorder, unspecified: Secondary | ICD-10-CM

## 2018-07-10 NOTE — Assessment & Plan Note (Signed)
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  LOW risk-Oncotype. Currently on Letrozole.   # STOP letrozole-secondary intolerance [hot flashes fatigue nausea].  Is not clear to me if patient's multitude of symptoms are related to letrozole alone versus other causes.  However we will give a trial of holding letrozole for the next 5 weeks.  #Patient wanted to come off all antiestrogen therapy given the side effects.  I recommended not to make any decisions at this time; allow improvement of symptoms.   # "Feeling poorly"-question anxiety/stress versus letrozole rather than infectious causes.  #Vaginal discharge-has appointment with gynecology this morning.  # DISPOSITION:  # follow up in 5 weeks- MD- DOX; in 4 weeks- labs- FSH/ estradiol/ LH/ cmp/cbc/ Thyroid profile [[facility]- NEEDS to have script MAILED to pt's home]-Dr.B

## 2018-07-15 ENCOUNTER — Encounter: Payer: Self-pay | Admitting: Gastroenterology

## 2018-07-15 ENCOUNTER — Other Ambulatory Visit: Payer: Self-pay

## 2018-07-15 ENCOUNTER — Ambulatory Visit (INDEPENDENT_AMBULATORY_CARE_PROVIDER_SITE_OTHER): Payer: Medicare Other | Admitting: Gastroenterology

## 2018-07-15 DIAGNOSIS — K5900 Constipation, unspecified: Secondary | ICD-10-CM

## 2018-07-15 DIAGNOSIS — K219 Gastro-esophageal reflux disease without esophagitis: Secondary | ICD-10-CM

## 2018-07-15 MED ORDER — FAMOTIDINE 20 MG PO TABS: 20.0000 mg | ORAL_TABLET | Freq: Every day | ORAL | 0 refills | Status: DC

## 2018-07-15 NOTE — Progress Notes (Signed)
Kelli Antigua, MD [address]  [place]  [place], [place] [address]  Main: [phone]  Fax: [phone]   Primary Care Physician: Gennette Pac, [place]  Virtual Visit via Video Note  I connected with patient on 07/15/18 at 10:15 AM EDT by video (using [email or web address]) and verified that I am speaking with the correct person using two identifiers.   I discussed the limitations, risks, security and privacy concerns of performing an evaluation and management service by video and the availability of in person appointments. I also discussed with the patient that there may be a patient responsible charge related to this service. The patient expressed understanding and agreed to proceed.  Location of Patient: Home Location of Provider: Home Persons involved: Patient and provider only (Nursing staff checked in patient via phone but were not physically involved in the video interaction - see their notes)   History of Present Illness: Chief complaint: Constipation  HPI: Kelli Logan is a 58 y.o. female being seen for follow-up of GERD and constipation.  Patient taking omeprazole once daily which controls her symptoms.  She states that even if she misses 1 day of the omeprazole, she has significant symptoms.  No dysphagia.  No nausea or vomiting or weight loss.  Reports small bowel movements and feeling of not evacuating completely on a daily basis.  States she feels this affects her appetite at times.  EGD June 2020 showed small hiatal hernia and few gastric polyps.  Otherwise normal  Colonoscopy June 2020, with 2 subcentimeter polyps removed, nonbleeding hemorrhoids noted, otherwise normal exam.  Repeat recommended in 5 years  Pathology results showed benign fundic gland polyp in the stomach.  Esophagus biopsies did not show EOE.  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 atorvastatin  (LIPITOR) 40 MG tablet Take 40 mg by mouth at bedtime.     . Biotin 5 MG TABS Take 5 mg by mouth daily.    . Chromium Picolinate (CHROMIUM PICOLATE PO) Take 1,000 mcg by mouth daily.    . citalopram (CELEXA) 40 MG tablet Take 40 mg by mouth every morning.     Marland Kitchen doxazosin (CARDURA) 1 MG tablet Take 1 tablet (1 mg total) by mouth 2 (two) times daily. 180 tablet 3  . Fluticasone-Salmeterol (WIXELA INHUB) 250-50 MCG/DOSE AEPB Inhale 1 puff into the lungs 2 (two) times daily.    . Glucosamine-Chondroitin 1500-1200 MG/30ML LIQD Take 2 tablets by mouth daily.    . hydrochlorothiazide (HYDRODIURIL) 25 MG tablet Take 25 mg by mouth daily.     . hydrOXYzine (ATARAX/VISTARIL) 25 MG tablet TAKE 1 TABLET (25 MG TOTAL) BY MOUTH 3 (THREE) TIMES DAILY AS NEEDED FOR ANXIETY. 90 tablet 0  . levothyroxine (SYNTHROID, LEVOTHROID) 50 MCG tablet Take 50 mcg by mouth every other day.     . levothyroxine (SYNTHROID, LEVOTHROID) 75 MCG tablet Take 75 mcg by mouth every other day.    . lisinopril (ZESTRIL) 20 MG tablet Take 20 mg by mouth daily.    . metoprolol (LOPRESSOR) 50 MG tablet Take 1 tablet (50 mg total) by mouth 2 (two) times daily. (Patient taking differently: Take 75 mg by mouth 2 (two) times daily. ) 60 tablet 0  . naproxen (NAPROSYN) 500 MG tablet Take 500 mg by mouth 2 (two) times daily as needed for moderate pain.    Marland Kitchen QUEtiapine (SEROQUEL) 50 MG tablet Take 50 mg by mouth at  bedtime.    . famotidine (PEPCID) 20 MG tablet Take 1 tablet (20 mg total) by mouth daily. 90 tablet 0  . HYDROcodone-acetaminophen (NORCO) 5-325 MG tablet Take 1 tablet by mouth every 6 (six) hours as needed for moderate pain. 30 tablet 0   No current facility-administered medications for this visit.     Allergies as of 07/15/2018  . (No Known Allergies)    Review of Systems:    All systems reviewed and negative except where noted in HPI.   Observations/Objective:  Labs: CMP     Component Value Date/Time   NA 131 (L)  04/12/2018 2236   K 3.7 04/12/2018 2236   CL 99 04/12/2018 2236   CO2 24 04/12/2018 2236   GLUCOSE 136 (H) 04/12/2018 2236   BUN 14 04/12/2018 2236   CREATININE 0.80 04/12/2018 2236   CALCIUM 9.1 04/12/2018 2236   PROT 7.0 12/18/2017 0944   ALBUMIN 4.0 12/18/2017 0944   AST 30 12/18/2017 0944   ALT 30 12/18/2017 0944   ALKPHOS 73 12/18/2017 0944   BILITOT 0.9 12/18/2017 0944   GFRNONAA >60 04/12/2018 2236   GFRAA >60 04/12/2018 2236   Lab Results  Component Value Date   WBC 9.5 04/12/2018   HGB 11.6 (L) 04/12/2018   HCT 34.6 (L) 04/12/2018   MCV 84.4 04/12/2018   PLT 367 04/12/2018    Imaging Studies: No results found.  Assessment and Plan:   Kelli Logan is a 58 y.o. y/o female with history of GERD and constipation  Assessment and Plan: GERD Well controlled with once daily PPI However, patient is willing to try switching to H2 RA due to side effects associated with PPI Will prescribe Pepcid once daily.  Patient asked to discontinue PPI.  If symptoms are worse, can resume PPI  (Risks of PPI use were discussed with patient including bone loss, C. Diff diarrhea, pneumonia, infections, CKD, electrolyte abnormalities.  If clinically possible based on symptoms, goal would be to maintain patient on the lowest dose possible, or discontinue the medication with institution of acid reflux lifestyle modifications over time. Pt. Verbalizes understanding and chooses to continue the medication.)  Constipation Start MiraLAX daily High-fiber diet  Colonoscopy up-to-date, repeat recommended in 5 years  Follow Up Instructions: Follow-up in 3 to 6 months   I discussed the assessment and treatment plan with the patient. The patient was provided an opportunity to ask questions and all were answered. The patient agreed with the plan and demonstrated an understanding of the instructions.   The patient was advised to call back or seek an in-person evaluation if the symptoms worsen or if  the condition fails to improve as anticipated.  I provided 30 minutes of face-to-face time via video software during this encounter. Additional time was spent in reviewing patient's chart, placing orders etc.   Virgel Manifold, MD  Speech recognition software was used to dictate this note.

## 2018-07-21 ENCOUNTER — Ambulatory Visit (INDEPENDENT_AMBULATORY_CARE_PROVIDER_SITE_OTHER): Payer: Medicare Other | Admitting: Licensed Clinical Social Worker

## 2018-07-21 ENCOUNTER — Other Ambulatory Visit: Payer: Self-pay

## 2018-07-21 ENCOUNTER — Encounter: Payer: Self-pay | Admitting: Licensed Clinical Social Worker

## 2018-07-21 DIAGNOSIS — F4310 Post-traumatic stress disorder, unspecified: Secondary | ICD-10-CM

## 2018-07-21 NOTE — Progress Notes (Signed)
Virtual Visit via Video Note  I connected with Kelli Logan on 07/21/18 at 11:00 AM EDT by a video enabled telemedicine application and verified that I am speaking with the correct person using two identifiers.   I discussed the limitations of evaluation and management by telemedicine and the availability of in person appointments. The patient expressed understanding and agreed to proceed.   I discussed the assessment and treatment plan with the patient. The patient was provided an opportunity to ask questions and all were answered. The patient agreed with the plan and demonstrated an understanding of the instructions.   The patient was advised to call back or seek an in-person evaluation if the symptoms worsen or if the condition fails to improve as anticipated.  I provided 60 minutes of non-face-to-face time during this encounter.   Alden Hipp, Kelli Logan    THERAPIST PROGRESS NOTE  Session Time: 1100  Participation Level: Active  Behavioral Response: CasualAlertAnxious  Type of Therapy: Individual Therapy  Treatment Goals addressed: Anxiety  Interventions: Supportive  Summary: Kelli Logan is a 58 y.o. female who presents with continuing symptoms related to her diagnosis. Kelli Logan reports doing well since our last session, but added she has increased anxiety around her physical health, "I just get to a breaking point because I want to leave the house and do something." Kelli Logan validated and normalized those feelings and encouraged Kelli Logan to recognize the collective societal anxiety around this same situation in order to feel less alone in her anxiety. Kelli Logan expressed understanding and agreement. Kelli Logan reported ongoing physical pain in her hips, but reported she has been doing everything she can to improve her physical health aside from having surgery again. "I'm just not ready for that yet." Kelli Logan validated and normalized those feelings as well. Kelli Logan encouraged Kelli Logan to begin  challenging negative thoughts by utilizing her CBT skills. Kelli Logan expressed understanding and agreement with this idea, but added, "it's hard sometimes." Kelli Logan validated that notion, but encouraged Kelli Logan to attempt reframing negative thoughts in order to help facilitate decreasing anxiety around her physical health and COVID-19. Kelli Logan expressed agreement and stated, "I'll try it."   Suicidal/Homicidal: No   Therapist Response: Kelli Logan continues to work towards her tx goals but has not yet reached them. We will continue to work on emotional regulation skills moving forward and challenging negative thoughts.   Plan: Return again in 4 weeks.  Diagnosis: Axis I: Post Traumatic Stress Disorder    Axis II: No diagnosis    Alden Hipp, Kelli Logan 07/21/2018
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
## 2018-07-22 ENCOUNTER — Ambulatory Visit (INDEPENDENT_AMBULATORY_CARE_PROVIDER_SITE_OTHER): Payer: Medicare Other | Admitting: Nurse Practitioner

## 2018-07-22 ENCOUNTER — Encounter: Payer: Self-pay | Admitting: Nurse Practitioner

## 2018-07-22 ENCOUNTER — Telehealth: Payer: Self-pay | Admitting: Nurse Practitioner

## 2018-07-22 VITALS — BP 168/111 | HR 64 | Temp 98.2°F | Ht 66.0 in | Wt 188.0 lb

## 2018-07-22 DIAGNOSIS — Z9012 Acquired absence of left breast and nipple: Secondary | ICD-10-CM

## 2018-07-22 MED ORDER — DIAZEPAM 2 MG PO TABS: 2.0000 mg | ORAL_TABLET | Freq: Two times a day (BID) | ORAL | 0 refills | Status: AC | PRN

## 2018-07-22 MED ORDER — HYDROCODONE-ACETAMINOPHEN 5-325 MG PO TABS: 1.0000 | ORAL_TABLET | Freq: Four times a day (QID) | ORAL | 0 refills | Status: AC | PRN

## 2018-07-22 MED ORDER — CEPHALEXIN 500 MG PO CAPS: 500.0000 mg | ORAL_CAPSULE | Freq: Two times a day (BID) | ORAL | 0 refills | Status: DC

## 2018-07-23 ENCOUNTER — Other Ambulatory Visit (INDEPENDENT_AMBULATORY_CARE_PROVIDER_SITE_OTHER): Payer: Self-pay | Admitting: Nurse Practitioner

## 2018-07-23 MED ORDER — CEPHALEXIN 500 MG PO CAPS: 500.0000 mg | ORAL_CAPSULE | Freq: Four times a day (QID) | ORAL | 0 refills | Status: AC

## 2018-07-23 NOTE — Progress Notes (Signed)
Medication updated

## 2018-07-23 NOTE — Telephone Encounter (Signed)
I spoke with Kelli Logan to confirm how many Norco and Zofran tablets she had left.

## 2018-07-24 ENCOUNTER — Encounter: Payer: Self-pay | Admitting: *Deleted

## 2018-07-25 ENCOUNTER — Inpatient Hospital Stay: Payer: Medicare Other | Attending: Internal Medicine | Admitting: Internal Medicine

## 2018-07-25 ENCOUNTER — Encounter: Payer: Self-pay | Admitting: Internal Medicine

## 2018-07-25 DIAGNOSIS — F419 Anxiety disorder, unspecified: Secondary | ICD-10-CM | POA: Diagnosis not present

## 2018-07-25 DIAGNOSIS — Z79899 Other long term (current) drug therapy: Secondary | ICD-10-CM

## 2018-07-25 DIAGNOSIS — Z9012 Acquired absence of left breast and nipple: Secondary | ICD-10-CM | POA: Diagnosis not present

## 2018-07-25 DIAGNOSIS — C50512 Malignant neoplasm of lower-outer quadrant of left female breast: Secondary | ICD-10-CM | POA: Diagnosis present

## 2018-07-25 DIAGNOSIS — Z170 Estrogen receptor positive status [ER+]: Secondary | ICD-10-CM | POA: Insufficient documentation

## 2018-07-25 NOTE — Assessment & Plan Note (Addendum)
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  LOW risk-Oncotype. STOPPED letrozole sec to intolerance [severe fatigue/joint pains hot flashes].  #Had a long discussion the patient the importance of antihormone therapy in prevention of breast cancer recurrence. Patient does not want to start at this time wants to wait until after surgery [see below].  #Left mastectomy-currently going through saline expanders [Dr.Dillingham]; July 27th.  Will defer to plastics given patient's concerns for cell expansion.  # stress/anxiety- poorly controlled-recommend follow-up with PCP/[facility] as per plastics.  # DISPOSITION:  # follow up in end of august 2020- MD; no labs--Dr.B

## 2018-07-25 NOTE — Progress Notes (Signed)
I connected with Eunice Blase on 07/25/18 at  2:30 PM EDT by video enabled telemedicine visit and verified that I am speaking with the correct person using two identifiers.  I discussed the limitations, risks, security and privacy concerns of performing an evaluation and management service by telemedicine and the availability of in-person appointments. I also discussed with the patient that there may be a patient responsible charge related to this service. The patient expressed understanding and agreed to proceed.    Other persons participating in the visit and their role in the encounter: RN/medical reconcilation Patient's location: Home   Provider's location: Office   Oncology History Overview Note  # DEC 2019/MARCH 2020- LEFT BREAST IMC ER-Pos [>90%]; PR Neg; Her 2 NEG; 55m; sN0 [pT1b sN0]; stage I; high grade DCIS with comedonecrosis/ calcifications- 5 x5.4cm. s/p mastectomy [Dr.Cannon/Dillingham];   # Oncotype-RS- 20; Distant recurrence-risk of recurrence 6%.  No adjuvant chemo.  No radiation.  #May 09, 2018-letrozole [LMP- 2018]  # anxiety/depression  DIAGNOSIS: Right Breast ca  STAGE:  I; GOALS: cure  CURRENT/MOST RECENT THERAPY: Femara     Malignant neoplasm of lower-outer quadrant of left breast of female, estrogen receptor positive ([facility]     Chief Complaint: Breast cancer   History of present illness:Kelli Logan 58y.o.  female with history of stage I ER PR positive HER-2 negative breast cancer-status post mastectomy.  Patient currently off Femara because of poor tolerance.  In the interim patient had endoscopy with GI negative for any acute process.  Patient complains of continued anxiety-given the current pandemic.  She also complains of significant discomfort from the saline expanders; awaiting implant placement end of July.   Observation/objective:  Assessment and plan: Malignant neoplasm of lower-outer quadrant of left breast of female, estrogen receptor  positive ([place] # Stage I left breast IMC; pT1b [6 mm] ER-Pos; PR-neg; Her 2 neu status post mastectomy.  LOW risk-Oncotype. STOPPED letrozole sec to intolerance [severe fatigue/joint pains hot flashes].  #Had a long discussion the patient the importance of antihormone therapy in prevention of breast cancer recurrence. Patient does not want to start at this time wants to wait until after surgery [see below].  #Left mastectomy-currently going through saline expanders [Dr.Dillingham]; July 27th.  Will defer to plastics given patient's concerns for cell expansion.  # stress/anxiety- poorly controlled-recommend follow-up with PCP/[facility] as per plastics.  # DISPOSITION:  # follow up in end of august 2020- MD; no labs--Dr.B   Follow-up instructions:  I discussed the assessment and treatment plan with the patient.  The patient was provided an opportunity to ask questions and all were answered.  The patient agreed with the plan and demonstrated understanding of instructions.  The patient was advised to call back or seek an in person evaluation if the symptoms worsen or if the condition fails to improve as anticipated.  Dr. GCharlaine DaltonCEast [place] [facility]7/17/2020 4:07 PM

## 2018-07-31 ENCOUNTER — Other Ambulatory Visit: Payer: Self-pay

## 2018-07-31 ENCOUNTER — Other Ambulatory Visit: Payer: Medicare Other

## 2018-07-31 ENCOUNTER — Encounter
Admission: RE | Admit: 2018-07-31 | Discharge: 2018-07-31 | Disposition: A | Discharge status: Home / Self Care | Payer: Medicare Other | Source: Ambulatory Visit | Attending: Plastic Surgery | Admitting: Plastic Surgery

## 2018-07-31 DIAGNOSIS — Z01812 Encounter for preprocedural laboratory examination: Secondary | ICD-10-CM | POA: Insufficient documentation

## 2018-07-31 DIAGNOSIS — Z1159 Encounter for screening for other viral diseases: Secondary | ICD-10-CM | POA: Insufficient documentation

## 2018-07-31 LAB — BASIC METABOLIC PANEL
Anion gap: 10 (ref 5–15)
BUN: 16 mg/dL (ref 6–20)
CO2: 29 mmol/L (ref 22–32)
Calcium: 9.4 mg/dL (ref 8.9–10.3)
Chloride: 95 mmol/L — ABNORMAL LOW (ref 98–111)
Creatinine, Ser: 0.78 mg/dL (ref 0.44–1.00)
GFR calc Af Amer: >60 mL/min (ref >60)
GFR calc non Af Amer: >60 mL/min (ref >60)
Glucose, Bld: 94 mg/dL (ref 70–99)
Potassium: 3.5 mmol/L (ref 3.5–5.1)
Sodium: 134 mmol/L — ABNORMAL LOW (ref 135–145)

## 2018-07-31 LAB — SARS CORONAVIRUS 2 (TAT 6-24 HRS): SARS Coronavirus 2: NEGATIVE

## 2018-07-31 NOTE — Patient Instructions (Addendum)
Your procedure is scheduled on: 08-04-18 MONDAY Report to Same Day Surgery 2nd floor medical mall [facility] Entrance-take elevator on left to 2nd floor.  Check in with surgery information desk.) To find out your arrival time please call [phone] between 1PM - 3PM on 08-01-18 THURSDAY  Remember: Instructions that are not followed completely may result in serious medical risk, up to and including death, or upon the discretion of your surgeon and anesthesiologist your surgery may need to be rescheduled.    _x___ 1. Do not eat food after midnight the night before your procedure. NO GUM OR CANDY AFTER MIDNIGHT. You may drink clear liquids up to 2 hours before you are scheduled to arrive at the hospital for your procedure.  Do not drink clear liquids within 2 hours of your scheduled arrival to the hospital.  Clear liquids include  --Water or Apple juice without pulp  --Clear carbohydrate beverage such as ClearFast or Gatorade  --Black Coffee or Clear Tea (No milk, no creamers, do not add anything to the coffee or Tea   ____Ensure clear carbohydrate drink on the way to the hospital for bariatric patients  ____Ensure clear carbohydrate drink 3 hours before surgery.     __x__ 2. No Alcohol for 24 hours before or after surgery.   __x__3. No Smoking or e-cigarettes for 24 prior to surgery.  Do not use any chewable tobacco products for at least 6 hour prior to surgery   ____  4. Bring all medications with you on the day of surgery if instructed.    __x__ 5. Notify your doctor if there is any change in your medical condition     (cold, fever, infections).    x___6. On the morning of surgery brush your teeth with toothpaste and water.  You may rinse your mouth with mouth wash if you wish.  Do not swallow any toothpaste or mouthwash.   Do not wear jewelry, make-up, hairpins, clips or nail polish.  Do not wear lotions, powders, or perfumes. You may wear deodorant.  Do not shave 48 hours  prior to surgery. Men may shave face and neck.  Do not bring valuables to the hospital.    [facility] is not responsible for any belongings or valuables.               Contacts, dentures or bridgework may not be worn into surgery.  Leave your suitcase in the car. After surgery it may be brought to your room.  For patients admitted to the hospital, discharge time is determined by your treatment team.  _  Patients discharged the day of surgery will not be allowed to drive home.  You will need someone to drive you home and stay with you the night of your procedure.    Please read over the following fact sheets that you were given:   [facility] Preparing for Surgery  _x___ TAKE THE FOLLOWING MEDICATION THE MORNING OF SURGERY WITH A SMALL SIP OF WATER. These include:  1. METOPROLOL  2. CELEXA (CITALOPRAM)  3. CARDURA (DOXAZOSIN)  4. GABAPENTIN (NEURONTIN)  5. HYDROXYZINE (ATARAX)  6. LEVOTHYROXINE (SYNTHROID)  7.PEPCID (FAMOTIDINE)  8.TAKE YOU AN EXTRA PEPCID AT BEDTIME THE NIGHT BEFORE YOUR SURGERY  ____Fleets enema or Magnesium Citrate as directed.   _x___ Use CHG Soap or sage wipes as directed on instruction sheet   _X___ Use inhalers on the day of surgery-USE YOUR [place]  ____ Stop Metformin and Janumet 2 days  prior to surgery.    ____ Take 1/2 of usual insulin dose the night before surgery and none on the morning surgery.   ____ Follow recommendations from Cardiologist, Pulmonologist or PCP regarding stopping Aspirin, Coumadin, Plavix ,Eliquis, Effient, or Pradaxa, and Pletal.  X____Stop Anti-inflammatories such as Advil, Aleve, Ibuprofen, Motrin, Naproxen, Naprosyn, Goodies powders or aspirin products. OK to take Tylenol   _x___ Stop supplements until after surgery-STOP YOUR BIOTIN, GLUCOSAMINE-CHONDROITIN AND CHROMIUM NOW-MAY RESUME AFTER SURGERY   ____ Bring C-Pap to the hospital.
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
## 2018-08-03 ENCOUNTER — Encounter: Payer: Self-pay | Admitting: Anesthesiology

## 2018-08-03 MED ORDER — CEFAZOLIN SODIUM-DEXTROSE 2-4 GM/100ML-% IV SOLN: 2.0000 g | INTRAVENOUS | Status: DC

## 2018-08-04 ENCOUNTER — Ambulatory Visit: Payer: Medicare Other | Admitting: Anesthesiology

## 2018-08-04 ENCOUNTER — Encounter: Payer: Self-pay | Admitting: *Deleted

## 2018-08-04 ENCOUNTER — Other Ambulatory Visit: Payer: Self-pay

## 2018-08-04 ENCOUNTER — Encounter
Admission: RE | Disposition: A | Discharge status: Home / Self Care | Payer: Self-pay | Source: Home / Self Care | Attending: Plastic Surgery

## 2018-08-04 ENCOUNTER — Ambulatory Visit
Admission: RE | Admit: 2018-08-04 | Discharge: 2018-08-04 | Disposition: A | Discharge status: Home / Self Care | Payer: Medicare Other | Attending: Plastic Surgery | Admitting: Plastic Surgery

## 2018-08-04 DIAGNOSIS — Z87891 Personal history of nicotine dependence: Secondary | ICD-10-CM | POA: Insufficient documentation

## 2018-08-04 DIAGNOSIS — Z853 Personal history of malignant neoplasm of breast: Secondary | ICD-10-CM | POA: Insufficient documentation

## 2018-08-04 DIAGNOSIS — F329 Major depressive disorder, single episode, unspecified: Secondary | ICD-10-CM | POA: Diagnosis not present

## 2018-08-04 DIAGNOSIS — I10 Essential (primary) hypertension: Secondary | ICD-10-CM | POA: Insufficient documentation

## 2018-08-04 DIAGNOSIS — M1990 Unspecified osteoarthritis, unspecified site: Secondary | ICD-10-CM | POA: Diagnosis not present

## 2018-08-04 DIAGNOSIS — Z79890 Hormone replacement therapy (postmenopausal): Secondary | ICD-10-CM | POA: Diagnosis not present

## 2018-08-04 DIAGNOSIS — Z9012 Acquired absence of left breast and nipple: Secondary | ICD-10-CM | POA: Insufficient documentation

## 2018-08-04 DIAGNOSIS — Z79899 Other long term (current) drug therapy: Secondary | ICD-10-CM | POA: Diagnosis not present

## 2018-08-04 DIAGNOSIS — F419 Anxiety disorder, unspecified: Secondary | ICD-10-CM | POA: Insufficient documentation

## 2018-08-04 DIAGNOSIS — Z421 Encounter for breast reconstruction following mastectomy: Secondary | ICD-10-CM | POA: Insufficient documentation

## 2018-08-04 DIAGNOSIS — E039 Hypothyroidism, unspecified: Secondary | ICD-10-CM | POA: Diagnosis not present

## 2018-08-04 DIAGNOSIS — N651 Disproportion of reconstructed breast: Secondary | ICD-10-CM

## 2018-08-04 DIAGNOSIS — M797 Fibromyalgia: Secondary | ICD-10-CM | POA: Insufficient documentation

## 2018-08-04 HISTORY — PX: REMOVAL OF TISSUE EXPANDER AND PLACEMENT OF IMPLANT: SHX6457

## 2018-08-04 HISTORY — PX: MASTOPEXY: SHX5358

## 2018-08-04 SURGERY — REMOVAL, TISSUE EXPANDER, BREAST, WITH IMPLANT INSERTION
Anesthesia: General | Site: Breast | Laterality: Right

## 2018-08-04 MED ORDER — DEXMEDETOMIDINE HCL 200 MCG/2ML IV SOLN
INTRAVENOUS | Status: DC | PRN
  Administered 2018-08-04: 12 ug via INTRAVENOUS
  Administered 2018-08-04: 8 ug via INTRAVENOUS

## 2018-08-04 MED ORDER — ONDANSETRON HCL 4 MG/2ML IJ SOLN
INTRAMUSCULAR | Status: DC | PRN
  Administered 2018-08-04: 4 mg via INTRAVENOUS

## 2018-08-04 MED ORDER — NEOMYCIN-POLYMYXIN B GU 40-200000 IR SOLN
Status: AC
  Filled 2018-08-04: qty 2

## 2018-08-04 MED ORDER — LACTATED RINGERS IV SOLN
INTRAVENOUS | Status: DC
  Administered 2018-08-04: 09:00:00 via INTRAVENOUS

## 2018-08-04 MED ORDER — BUPIVACAINE-EPINEPHRINE (PF) 0.25% -1:200000 IJ SOLN
INTRAMUSCULAR | Status: DC | PRN
  Administered 2018-08-04: 20 mL

## 2018-08-04 MED ORDER — SODIUM CHLORIDE FLUSH 0.9 % IV SOLN
INTRAVENOUS | Status: AC
  Filled 2018-08-04: qty 10

## 2018-08-04 MED ORDER — FENTANYL CITRATE (PF) 100 MCG/2ML IJ SOLN
25.0000 ug | INTRAMUSCULAR | Status: AC | PRN
  Administered 2018-08-04 (×6): 25 ug via INTRAVENOUS

## 2018-08-04 MED ORDER — FENTANYL CITRATE (PF) 100 MCG/2ML IJ SOLN
INTRAMUSCULAR | Status: DC | PRN
  Administered 2018-08-04 (×5): 25 ug via INTRAVENOUS

## 2018-08-04 MED ORDER — MIDAZOLAM HCL 2 MG/2ML IJ SOLN
INTRAMUSCULAR | Status: DC | PRN
  Administered 2018-08-04: 2 mg via INTRAVENOUS

## 2018-08-04 MED ORDER — PHENYLEPHRINE HCL (PRESSORS) 10 MG/ML IV SOLN
INTRAVENOUS | Status: DC | PRN
  Administered 2018-08-04 (×7): 100 ug via INTRAVENOUS

## 2018-08-04 MED ORDER — CHLORHEXIDINE GLUCONATE CLOTH 2 % EX PADS: 6.0000 | MEDICATED_PAD | Freq: Once | CUTANEOUS | Status: DC

## 2018-08-04 MED ORDER — EPHEDRINE SULFATE 50 MG/ML IJ SOLN
INTRAMUSCULAR | Status: DC | PRN
  Administered 2018-08-04: 10 mg via INTRAVENOUS
  Administered 2018-08-04: 20 mg via INTRAVENOUS
  Administered 2018-08-04: 10 mg via INTRAVENOUS
  Administered 2018-08-04 (×3): 15 mg via INTRAVENOUS

## 2018-08-04 MED ORDER — FENTANYL CITRATE (PF) 100 MCG/2ML IJ SOLN
INTRAMUSCULAR | Status: AC
  Filled 2018-08-04: qty 2

## 2018-08-04 MED ORDER — BUPIVACAINE-EPINEPHRINE (PF) 0.5% -1:200000 IJ SOLN
INTRAMUSCULAR | Status: AC
  Filled 2018-08-04: qty 30

## 2018-08-04 MED ORDER — CEFAZOLIN SODIUM-DEXTROSE 2-4 GM/100ML-% IV SOLN
INTRAVENOUS | Status: AC
  Filled 2018-08-04: qty 100

## 2018-08-04 MED ORDER — FENTANYL CITRATE (PF) 100 MCG/2ML IJ SOLN
INTRAMUSCULAR | Status: AC
  Administered 2018-08-04: 25 ug via INTRAVENOUS
  Filled 2018-08-04: qty 2

## 2018-08-04 MED ORDER — FENTANYL CITRATE (PF) 100 MCG/2ML IJ SOLN
50.0000 ug | Freq: Once | INTRAMUSCULAR | Status: AC
  Administered 2018-08-04: 13:00:00 50 ug via INTRAVENOUS

## 2018-08-04 MED ORDER — GLYCOPYRROLATE 0.2 MG/ML IJ SOLN
INTRAMUSCULAR | Status: DC | PRN
  Administered 2018-08-04: 0.2 mg via INTRAVENOUS

## 2018-08-04 MED ORDER — DEXMEDETOMIDINE HCL IN NACL 80 MCG/20ML IV SOLN
INTRAVENOUS | Status: AC
  Filled 2018-08-04: qty 20

## 2018-08-04 MED ORDER — ONDANSETRON HCL 4 MG/2ML IJ SOLN: 4.0000 mg | Freq: Once | INTRAMUSCULAR | Status: DC | PRN

## 2018-08-04 MED ORDER — GLYCOPYRROLATE 0.2 MG/ML IJ SOLN
INTRAMUSCULAR | Status: AC
  Filled 2018-08-04: qty 1

## 2018-08-04 MED ORDER — PROPOFOL 10 MG/ML IV BOLUS
INTRAVENOUS | Status: AC
  Filled 2018-08-04: qty 20

## 2018-08-04 MED ORDER — EPHEDRINE SULFATE 50 MG/ML IJ SOLN
INTRAMUSCULAR | Status: AC
  Filled 2018-08-04: qty 1

## 2018-08-04 MED ORDER — LIDOCAINE HCL (PF) 2 % IJ SOLN
INTRAMUSCULAR | Status: AC
  Filled 2018-08-04: qty 10

## 2018-08-04 MED ORDER — LIDOCAINE-EPINEPHRINE (PF) 1 %-1:200000 IJ SOLN
INTRAMUSCULAR | Status: AC
  Filled 2018-08-04: qty 30

## 2018-08-04 MED ORDER — ONDANSETRON HCL 4 MG/2ML IJ SOLN
INTRAMUSCULAR | Status: AC
  Filled 2018-08-04: qty 2

## 2018-08-04 MED ORDER — LIDOCAINE HCL (CARDIAC) PF 100 MG/5ML IV SOSY
PREFILLED_SYRINGE | INTRAVENOUS | Status: DC | PRN
  Administered 2018-08-04: 60 mg via INTRAVENOUS

## 2018-08-04 MED ORDER — CEFAZOLIN SODIUM-DEXTROSE 2-4 GM/100ML-% IV SOLN
2.0000 g | INTRAVENOUS | Status: AC
  Administered 2018-08-04: 2 g via INTRAVENOUS

## 2018-08-04 MED ORDER — MIDAZOLAM HCL 2 MG/2ML IJ SOLN
INTRAMUSCULAR | Status: AC
  Filled 2018-08-04: qty 2

## 2018-08-04 MED ORDER — NEOMYCIN-POLYMYXIN B GU 40-200000 IR SOLN
Status: DC | PRN
  Administered 2018-08-04: 4 mL

## 2018-08-04 MED ORDER — PROPOFOL 10 MG/ML IV BOLUS
INTRAVENOUS | Status: DC | PRN
  Administered 2018-08-04: 200 mg via INTRAVENOUS

## 2018-08-04 MED ORDER — DEXAMETHASONE SODIUM PHOSPHATE 10 MG/ML IJ SOLN
INTRAMUSCULAR | Status: AC
  Filled 2018-08-04: qty 1

## 2018-08-04 MED ORDER — DEXAMETHASONE SODIUM PHOSPHATE 10 MG/ML IJ SOLN
INTRAMUSCULAR | Status: DC | PRN
  Administered 2018-08-04: 10 mg via INTRAVENOUS

## 2018-08-04 MED ORDER — FENTANYL CITRATE (PF) 100 MCG/2ML IJ SOLN
INTRAMUSCULAR | Status: AC
  Administered 2018-08-04: 12:00:00 25 ug via INTRAVENOUS
  Filled 2018-08-04: qty 2

## 2018-08-04 MED ORDER — BUPIVACAINE-EPINEPHRINE (PF) 0.25% -1:200000 IJ SOLN
INTRAMUSCULAR | Status: AC
  Filled 2018-08-04: qty 30

## 2018-08-04 SURGICAL SUPPLY — 71 items
BAG DECANTER FOR FLEXI CONT (MISCELLANEOUS) ×2 IMPLANT
BINDER BREAST LRG (GAUZE/BANDAGES/DRESSINGS) ×2 IMPLANT
BINDER BREAST MEDIUM (GAUZE/BANDAGES/DRESSINGS) IMPLANT
BINDER BREAST XLRG (GAUZE/BANDAGES/DRESSINGS) ×1 IMPLANT
BINDER BREAST XXLRG (GAUZE/BANDAGES/DRESSINGS) IMPLANT
BIOPATCH RED 1 DISK 7.0 (GAUZE/BANDAGES/DRESSINGS) IMPLANT
BLADE BOVIE TIP EXT 4 (BLADE) ×1 IMPLANT
BLADE SURG 15 STRL LF DISP TIS (BLADE) ×2 IMPLANT
BLADE SURG 15 STRL SS (BLADE) ×1
BULB RESERV EVAC DRAIN JP 100C (MISCELLANEOUS) ×2 IMPLANT
CANISTER SUCT 1200ML W/VALVE (MISCELLANEOUS) ×3 IMPLANT
CANISTER SUCT 3000ML PPV (MISCELLANEOUS) ×2 IMPLANT
CHLORAPREP W/TINT 26 (MISCELLANEOUS) ×3 IMPLANT
COVER LIGHT HANDLE STERIS (MISCELLANEOUS) ×4 IMPLANT
COVER WAND RF STERILE (DRAPES) ×2 IMPLANT
DECANTER SPIKE VIAL GLASS SM (MISCELLANEOUS) IMPLANT
DERMABOND ADVANCED (GAUZE/BANDAGES/DRESSINGS) ×3
DERMABOND ADVANCED .7 DNX12 (GAUZE/BANDAGES/DRESSINGS) ×4 IMPLANT
DRAIN CHANNEL 19F RND (DRAIN) ×2 IMPLANT
DRAPE HALF SHEET 70X43 (DRAPES) IMPLANT
DRAPE LAPAROTOMY TRNSV 106X77 (MISCELLANEOUS) ×3 IMPLANT
DRAPE SPLIT 6X30 W/TAPE (DRAPES) ×4 IMPLANT
DRAPE WARM FLUID 44X44 (DRAPES) ×2 IMPLANT
ELECT COATED BLADE 2.86 ST (ELECTRODE) ×3 IMPLANT
ELECT REM PT RETURN 9FT ADLT (ELECTROSURGICAL) ×3
ELECTRODE REM PT RTRN 9FT ADLT (ELECTROSURGICAL) ×2 IMPLANT
GAUZE SPONGE 4X4 12PLY STRL (GAUZE/BANDAGES/DRESSINGS) ×2 IMPLANT
GLOVE BIO SURGEON STRL SZ 6.5 (GLOVE) ×8 IMPLANT
GOWN STRL REUS W/ TWL LRG LVL3 (GOWN DISPOSABLE) ×4 IMPLANT
GOWN STRL REUS W/TWL LRG LVL3 (GOWN DISPOSABLE) ×2
HANDLE YANKAUER SUCT BULB TIP (MISCELLANEOUS) ×3 IMPLANT
IMPL BREAST P6.5XULT HI 650 (Breast) IMPLANT
IMPL BRST P6.5XULT HI 650CC (Breast) ×2 IMPLANT
IMPLANT BREAST GEL 650CC (Breast) ×1 IMPLANT
KIT BASIN OR (CUSTOM PROCEDURE TRAY) ×3 IMPLANT
KIT PINK PAD W/HEAD ARE REST (MISCELLANEOUS)
KIT PINK PAD W/HEAD ARM REST (MISCELLANEOUS) ×2 IMPLANT
KIT TURNOVER KIT A (KITS) ×3 IMPLANT
NDL HYPO 25X1 1.5 SAFETY (NEEDLE) ×2 IMPLANT
NDL SAFETY ECLIPSE 18X1.5 (NEEDLE) ×2 IMPLANT
NEEDLE HYPO 18GX1.5 SHARP (NEEDLE) ×1
NEEDLE HYPO 25X1 1.5 SAFETY (NEEDLE) ×3 IMPLANT
NS IRRIG 1000ML POUR BTL (IV SOLUTION) ×3 IMPLANT
PACK BASIN MINOR ARMC (MISCELLANEOUS) ×3 IMPLANT
PAD ABD DERMACEA PRESS 5X9 (GAUZE/BANDAGES/DRESSINGS) ×6 IMPLANT
PENCIL ELECTRO HAND CTR (MISCELLANEOUS) ×3 IMPLANT
SIZER BREAST REUSE 650CC (SIZER) ×1
SIZER BRST REUSE P6.4 650CC (SIZER) IMPLANT
SPONGE LAP 18X18 RF (DISPOSABLE) ×5 IMPLANT
STAPLER SKIN PROX 35W (STAPLE) ×2 IMPLANT
STRIP CLOSURE SKIN 1/2X4 (GAUZE/BANDAGES/DRESSINGS) ×5 IMPLANT
SUT MNCRL 3-0 UNDYED SH (SUTURE) IMPLANT
SUT MNCRL 4-0 (SUTURE) ×2
SUT MNCRL 4-0 27XMFL (SUTURE) ×4
SUT MNCRL AB 3-0 PS2 18 (SUTURE) IMPLANT
SUT MNCRL AB 4-0 PS2 18 (SUTURE) ×6 IMPLANT
SUT MNCRL+ 5-0 UNDYED PC-3 (SUTURE) ×4 IMPLANT
SUT MON AB 3-0 SH 27 (SUTURE) ×4 IMPLANT
SUT MONOCRYL 3-0 UNDYED (SUTURE) ×3
SUT MONOCRYL 5-0 (SUTURE) ×2
SUT PDS AB 2-0 CT1 27 (SUTURE) IMPLANT
SUT PDS AB 2-0 CT2 27 (SUTURE) IMPLANT
SUT SILK 4 0 SH (SUTURE) IMPLANT
SUT VIC AB 2-0 SH 27 (SUTURE)
SUT VIC AB 2-0 SH 27XBRD (SUTURE) ×2 IMPLANT
SUTURE MNCRL 4-0 27XMF (SUTURE) IMPLANT
SYR 10ML LL (SYRINGE) IMPLANT
SYR BULB IRRIG 60ML STRL (SYRINGE) ×2 IMPLANT
TOWEL OR 17X26 4PK STRL BLUE (TOWEL DISPOSABLE) ×5 IMPLANT
TRAY FOLEY MTR SLVR 16FR STAT (SET/KITS/TRAYS/PACK) IMPLANT
WATER STERILE IRR 1000ML POUR (IV SOLUTION) IMPLANT

## 2018-08-04 NOTE — Anesthesia Postprocedure Evaluation (Signed)
Anesthesia Post Note  Patient: Games developer  Procedure(s) Performed: REMOVAL OF TISSUE EXPANDER AND PLACEMENT OF IMPLANT (Left Breast) MASTOPEXY (Right Breast)  Patient location during evaluation: PACU Anesthesia Type: General Level of consciousness: awake and alert Pain management: pain level controlled Vital Signs Assessment: post-procedure vital signs reviewed and stable Respiratory status: spontaneous breathing, nonlabored ventilation, respiratory function stable and patient connected to nasal cannula oxygen Cardiovascular status: blood pressure returned to baseline and stable Postop Assessment: no apparent nausea or vomiting Anesthetic complications: no     Last Vitals:  Vitals:   08/04/18 1238 08/04/18 1251  BP: 112/82 122/85  Pulse: 81 93  Resp: (!) 21 18  Temp:  (!) 36.4 C  SpO2: 95% 98%    Last Pain:  Vitals:   08/04/18 1251  TempSrc: Temporal  PainSc: 0-No pain                 Shaunae Sieloff S

## 2018-08-04 NOTE — Transfer of Care (Signed)
Immediate Anesthesia Transfer of Care Note  Patient: Kelli Logan  Procedure(s) Performed: REMOVAL OF TISSUE EXPANDER AND PLACEMENT OF IMPLANT (Left Breast) MASTOPEXY (Right Breast)  Patient Location: PACU  Anesthesia Type:General  Level of Consciousness: drowsy and patient cooperative  Airway & Oxygen Therapy: Patient Spontanous Breathing and Patient connected to face mask oxygen  Post-op Assessment: Report given to RN and Post -op Vital signs reviewed and stable  Post vital signs: Reviewed and stable  Last Vitals:  Vitals Value Taken Time  BP 113/69 08/04/18 1124  Temp 36.2 C 08/04/18 1124  Pulse 73 08/04/18 1125  Resp 11 08/04/18 1125  SpO2 93 % 08/04/18 1125  Vitals shown include unvalidated device data.  Last Pain:  Vitals:   08/04/18 0805  TempSrc: Tympanic  PainSc: 0-No pain         Complications: No apparent anesthesia complications

## 2018-08-04 NOTE — Progress Notes (Signed)
Ch visited briefly with pt as she was preparing for her consult with the care team. Pt c/o not receiving f/u from the initial surgery. Pt presented to be a bit anxious but aware from previous encounters that this may be the pt's baseline due to other medical challenges. Ch provided words of encouragement and allowed time for the nurse to complete the pt's assessment.  No further needs at this time.    08/04/18 0800  Clinical Encounter Type  Visited With Patient;Health care provider  Visit Type Follow-up;Psychological support;Spiritual support;Social support;Pre-op  Referral From Patient  Consult/Referral To Chaplain  Spiritual Encounters  Spiritual Needs Grief support;Emotional  Stress Factors  Patient Stress Factors Health changes;Loss of control;Major life changes  Family Stress Factors None identified

## 2018-08-04 NOTE — Interval H&P Note (Signed)
History and Physical Interval Note:  08/04/2018 8:55 AM  Kelli Logan  has presented today for surgery, with the diagnosis of Acquired Absence Of Left Breast.  The various methods of treatment have been discussed with the patient and family. After consideration of risks, benefits and other options for treatment, the patient has consented to  Procedure(s) with comments: REMOVAL OF TISSUE EXPANDER AND PLACEMENT OF IMPLANT (Left) MASTOPEXY (Right) - TOTAL CASE TIME SHOULD BE 3 HOURS, PLEASE as a surgical intervention.  The patient's history has been reviewed, patient examined, no change in status, stable for surgery.  I have reviewed the patient's chart and labs.  Questions were answered to the patient's satisfaction.     Loel Lofty Donielle Radziewicz

## 2018-08-04 NOTE — Anesthesia Post-op Follow-up Note (Signed)
Anesthesia QCDR form completed.        

## 2018-08-04 NOTE — Op Note (Signed)
Op report Unilateral Breast Exchange   DATE OF OPERATION:  08/04/2018  LOCATION: [facility]  SURGICAL DIVISION: Plastic Surgery  PREOPERATIVE DIAGNOSES:  1. History of left breast cancer.  2. Acquired absence of left breast.  3. Breast asymmetry on the right.  POSTOPERATIVE DIAGNOSES:  1. History of left breast cancer.  2. Acquired absence of left breast.  3. Breast asymmetry on the right.  PROCEDURE:  1. Exchange of tissue expander for implant. 2. Capsulotomies for implant respositioning. 3. Right mastopexy for symmetry.  SURGEON: Salwa Bai Sanger Caulder Wehner, DO  ANESTHESIA:  General.   COMPLICATIONS: None.   IMPLANTS: [employer]or Smooth Round Ultra High Profile Gel 650cc. Ref #[id number].  Serial Number [id number]  INDICATIONS FOR PROCEDURE:  The patient, Kelli Logan, is a 58 y.o. female born on [birth year], is here for treatment for further treatment after a mastectomy and placement of a tissue expander. She now presents for exchange of her expander for an implant.  She requires capsulotomies to better position the implant.  She has asymmetry due to the reconstruction and will have the right breast lifted for better positioning. MRN: [id number]  CONSENT:  Informed consent was obtained directly from the patient. Risks, benefits and alternatives were fully discussed. Specific risks including but not limited to bleeding, infection, hematoma, seroma, scarring, pain, implant infection, implant extrusion, capsular contracture, asymmetry, wound healing problems, and need for further surgery were all discussed. The patient did have an ample opportunity to have her questions answered to her satisfaction.   DESCRIPTION OF PROCEDURE:  The patient was taken to the operating room. SCDs were placed and IV antibiotics were given. The patient's chest was prepped and draped in a sterile fashion. A time out was performed and the implants to be used were identified.  One percent  Xylocaine with epinephrine was used to infiltrate the area.   The old mastectomy scar was opened and superior mastectomy and inferior mastectomy flaps were re-raised over the pectoralis major muscle. The pectoralis was split to expose the tissue expander which was removed. Inspection of the pocket showed a normal healthy capsule and good integration of the biologic matrix.   Circumferential capsulotomies were performed to allow for breast pocket expansion.  Measurements were made to confirm adequate pocket size for the implant dimensions.  Hemostasis was ensured with electrocautery.  The pocket was irrigated with antibiotic solution.  New gloves were placed.  The implant was placed in the pocket and oriented appropriately. The pectoralis major muscle and capsule on the anterior surface were re-closed with a 3-0 running Monocryl suture. The remaining skin was closed with 4-0 Monocryl deep dermal and 5-0 Monocryl subcuticular stitches.  Right side: Preoperative markings were confirmed.  Incision lines were injected with local with epinephrine.  After waiting for vasoconstriction, the marked lines were incised.  A Wise-pattern incision was performed by de-epithelializing the peri-areolar skin.  The NAC was brought superior and secured in place with 4-0 Monocryl.  A running subcuticular 4-0 Monocryl was used to close the skin edges.  The vertical limb was defatted and closed with the 3-0 and 4-0 Monocryl deep followed by the 5-0 Monocryl at the skin.  Care was taken to not undermine the breast pedicle. Hemostasis was achieved.  Dermabond and steri strips were applied.  A breast binder and ABDs were placed.  The nipple and skin flaps had good capillary refill at the end of the procedure.  The patient tolerated the procedure well. The patient was allowed to wake from  anesthesia and taken to the recovery room in satisfactory condition

## 2018-08-04 NOTE — Anesthesia Preprocedure Evaluation (Addendum)
Anesthesia Evaluation  Patient identified by MRN, date of birth, ID band Patient awake    Reviewed: Allergy & Precautions, NPO status , Patient's Chart, lab work & pertinent test results, reviewed documented beta blocker date and time   Airway Mallampati: II  TM Distance: >3 FB     Dental  (+) Chipped   Pulmonary former smoker,           Cardiovascular hypertension, Pt. on medications and Pt. on home beta blockers      Neuro/Psych  Headaches, PSYCHIATRIC DISORDERS Anxiety Depression  Neuromuscular disease    GI/Hepatic hiatal hernia,   Endo/Other    Renal/GU      Musculoskeletal  (+) Arthritis , Fibromyalgia -  Abdominal   Peds  Hematology   Anesthesia Other Findings Hx of tachycardia. Denies regurgitant. Has reflux, but controlled with meds. LMA ok.  Reproductive/Obstetrics                            Anesthesia Physical Anesthesia Plan  ASA: III  Anesthesia Plan: General   Post-op Pain Management:    Induction: Intravenous  PONV Risk Score and Plan:   Airway Management Planned: Oral ETT and LMA  Additional Equipment:   Intra-op Plan:   Post-operative Plan:   Informed Consent: I have reviewed the patients History and Physical, chart, labs and discussed the procedure including the risks, benefits and alternatives for the proposed anesthesia with the patient or authorized representative who has indicated his/her understanding and acceptance.       Plan Discussed with: CRNA  Anesthesia Plan Comments:         Anesthesia Quick Evaluation

## 2018-08-04 NOTE — Anesthesia Procedure Notes (Signed)
Procedure Name: LMA Insertion Date/Time: 08/04/2018 9:20 AM Performed by: Jonna Clark, CRNA Pre-anesthesia Checklist: Patient identified, Patient being monitored, Timeout performed, Emergency Drugs available and Suction available Patient Re-evaluated:Patient Re-evaluated prior to induction Oxygen Delivery Method: Circle system utilized Preoxygenation: Pre-oxygenation with 100% oxygen Induction Type: IV induction Ventilation: Mask ventilation without difficulty LMA: LMA inserted LMA Size: 4.0 Tube type: Oral Number of attempts: 1 Placement Confirmation: positive ETCO2 and breath sounds checked- equal and bilateral Tube secured with: Tape Dental Injury: Teeth and Oropharynx as per pre-operative assessment

## 2018-08-04 NOTE — Progress Notes (Signed)
Spoke with Dr. Marcello Moores regarding IV placement , pt. Has had no previous surgery on right side . Okay to place IV on right arm.

## 2018-08-04 NOTE — Discharge Instructions (Addendum)
INSTRUCTIONS FOR AFTER SURGERY   You are having surgery.  You will likely have some questions about what to expect following your operation.  The following information will help you and your family understand what to expect when you are discharged from the hospital.  Following these guidelines will help ensure a smooth recovery and reduce risks of complications.   Postoperative instructions include information on: diet, wound care, medications and physical activity.  AFTER SURGERY Expect to go home after the procedure.  In some cases, you may need to spend one night in the hospital for observation.  DIET This surgery does not require a specific diet.  However, I have to mention that the healthier you eat the better your body can start healing. It is important to increasing your protein intake.  This means limiting the foods with sugar and carbohydrates.  Focus on vegetables and some meat.  If you have any liposuction during your procedure be sure to drink water.  If your urine is bright yellow, then it is concentrated, and you need to drink more water.  As a general rule after surgery, you should have 8 ounces of water every hour while awake.  If you find you are persistently nauseated or unable to take in liquids let us know.  NO TOBACCO USE or EXPOSURE.  This will slow your healing process and increase the risk of a wound.  WOUND CARE If you don't have a drain: You can shower the day after surgery if you don't have a drain.  Use fragrance free soap.  Dial, [place] and [place] are usually mild on the skin.    If you have steri-strips / tape directly attached to your skin leave them in place. It is OK to get these wet.  No baths, pools or hot tubs for two weeks. We close your incision to leave the smallest and best-looking scar. No ointment or creams on your incisions until given the go ahead.  Especially not Neosporin (Too many skin reactions with this one).  A few weeks after surgery you can use Mederma  and start massaging the scar. We ask you to wear your binder or sports bra for the first 6 weeks around the clock, including while sleeping. This provides added comfort and helps reduce the fluid accumulation at the surgery site.  ACTIVITY No heavy lifting until cleared by the doctor.  It is OK to walk and climb stairs. In fact, moving your legs is very important to decrease your risk of a blood clot.  It will also help keep you from getting deconditioned.  Every 1 to 2 hours get up and walk for 5 minutes. This will help with a quicker recovery back to normal.  Let pain be your guide so you don't do too much.  NO, you cannot do the spring cleaning and don't plan on taking care of anyone else.  This is your time for TLC.  You will be more comfortable if you sleep and rest with your head elevated either with a few pillows under you or in a recliner.  No stomach sleeping for a few months.  WORK Everyone returns to work at different times. As a rough guide, most people take at least 1 - 2 weeks off prior to returning to work. If you need documentation for your job, bring the forms to your postoperative follow up visit.  DRIVING Arrange for someone to bring you home from the hospital.  You may be able to drive a  few days after surgery but not while taking any narcotics or valium.  BOWEL MOVEMENTS Constipation can occur after anesthesia and while taking pain medication.  It is important to stay ahead for your comfort.  We recommend taking Milk of Magnesia (2 tablespoons; twice a day) while taking the pain pills.  SEROMA This is fluid your body tried to put in the surgical site.  This is normal but if it creates tight skinny skin let us know.  It usually decreases in a few weeks.  WHEN TO CALL Call your surgeon's office if any of the following occur:  Fever 101 degrees F or greater  Excessive bleeding or fluid from the incision site.  Pain that increases over time without aid from the  medications  Redness, warmth, or pus draining from incision sites  Persistent nausea or inability to take in liquids  Severe misshapen area that underwent the operation.  AMBULATORY SURGERY  DISCHARGE INSTRUCTIONS   1) The drugs that you were given will stay in your system until tomorrow so for the next 24 hours you should not:  A) Drive an automobile B) Make any legal decisions C) Drink any alcoholic beverage   2) You may resume regular meals tomorrow.  Today it is better to start with liquids and gradually work up to solid foods.  You may eat anything you prefer, but it is better to start with liquids, then soup and crackers, and gradually work up to solid foods.   3) Please notify your doctor immediately if you have any unusual bleeding, trouble breathing, redness and pain at the surgery site, drainage, fever, or pain not relieved by medication.    4) Additional Instructions:        Please contact your physician with any problems or Same Day Surgery at [phone], Monday through Friday 6 am to 4 pm, or [place] [facility] number at [phone].

## 2018-08-06 LAB — SURGICAL PATHOLOGY

## 2018-08-07 DIAGNOSIS — F411 Generalized anxiety disorder: Secondary | ICD-10-CM | POA: Insufficient documentation

## 2018-08-07 DIAGNOSIS — R200 Anesthesia of skin: Secondary | ICD-10-CM | POA: Insufficient documentation

## 2018-08-11 ENCOUNTER — Telehealth: Payer: Self-pay

## 2018-08-11 NOTE — Telephone Encounter (Signed)

## 2018-08-12 ENCOUNTER — Encounter: Payer: Self-pay | Admitting: Nurse Practitioner

## 2018-08-12 ENCOUNTER — Ambulatory Visit (INDEPENDENT_AMBULATORY_CARE_PROVIDER_SITE_OTHER): Payer: Medicare Other | Admitting: Nurse Practitioner

## 2018-08-12 ENCOUNTER — Other Ambulatory Visit: Payer: Self-pay

## 2018-08-12 VITALS — BP 166/107 | HR 75 | Temp 97.7°F | Ht 66.25 in | Wt 192.0 lb

## 2018-08-12 DIAGNOSIS — Z9882 Breast implant status: Secondary | ICD-10-CM

## 2018-08-12 DIAGNOSIS — Z9012 Acquired absence of left breast and nipple: Secondary | ICD-10-CM

## 2018-08-12 MED ORDER — FLUCONAZOLE 150 MG PO TABS: 150.0000 mg | ORAL_TABLET | Freq: Once | ORAL | 1 refills | Status: AC

## 2018-08-12 NOTE — Progress Notes (Signed)
Patient ID: Kelli Logan, female    DOB: [birth year], 59 y.o.   MRN: [id number]   C.C.: post-op follow up  Kelli Logan is a 58 yo female POD #8 s/p removal of left breast tissue expander, left breast silicone implant placement, and right breast mastopexy. Patient states her right breast is itching. She has a skin tear on the right breast from tape. She states the bruising on the right breast has gotten better. Patient states she is no longer taking anything for pain. She took a few tabs of norco and a few days worth of valium, but has not required anything since. She is wearing a front zipping sports bra. Patient states her blood pressure medications were changed yesterday. She has vaginal itching and believes she has a yeast infection from the antibiotics.   Review of Systems  Constitutional: Negative.   HENT: Negative.   Respiratory: Negative.   Cardiovascular: Negative.   Gastrointestinal: Negative.   Endocrine: Negative.   Genitourinary: Positive for vaginal discharge.       Vaginal itching  Musculoskeletal: Positive for arthralgias.  Skin:       Itching, bruising    Past Medical History:  Diagnosis Date  . Anxiety   . Arthritis   . Cancer ([place])   . Depression   . Fibromyalgia   . History of kidney stones    h/o  . Hypertension   . Thyroid disease     Past Surgical History:  Procedure Laterality Date  . APPENDECTOMY    . BREAST BIOPSY Left few yrs ago   stereotactic bx in [place], benign  . BREAST BIOPSY Left 12/26/2017   Affirm Bx IMC- X-Clip  . BREAST BIOPSY Left 01/14/2018   Affirm Bx- Coil clip- path pending  . BREAST RECONSTRUCTION WITH PLACEMENT OF TISSUE EXPANDER AND ALLODERM Left 04/08/2018   Procedure: LEFT BREAST IMMEDIATE  RECONSTRUCTION WITH EXPANDER AND FLEX HD;  Surgeon: Wallace Going, DO;  Location: [facility] ORS;  Service: Plastics;  Laterality: Left;  . COLONOSCOPY WITH PROPOFOL N/A 06/26/2018   Procedure: COLONOSCOPY WITH PROPOFOL;  Surgeon:  Virgel Manifold, MD;  Location: [facility];  Service: Endoscopy;  Laterality: N/A;  . ESOPHAGOGASTRODUODENOSCOPY (EGD) WITH PROPOFOL N/A 06/26/2018   Procedure: ESOPHAGOGASTRODUODENOSCOPY (EGD) WITH PROPOFOL;  Surgeon: Virgel Manifold, MD;  Location: [facility];  Service: Endoscopy;  Laterality: N/A;  . FOOT BONE EXCISION    . MASTECTOMY    . MASTECTOMY W/ SENTINEL NODE BIOPSY Left 04/08/2018   Procedure: MASTECTOMY WITH SENTINEL LYMPH NODE BIOPSY LEFT;  Surgeon: Fredirick Maudlin, MD;  Location: [facility] OR[facility];  Service: General;  Laterality: Left;  Marland Kitchen MASTOPEXY Right 08/04/2018   Procedure: MASTOPEXY;  Surgeon: Wallace Going, DO;  Location: [facility] OR[facility];  Service: Plastics;  Laterality: Right;  TOTAL CASE TIME SHOULD BE 3 HOURS, PLEASE  . REMOVAL OF TISSUE EXPANDER AND PLACEMENT OF IMPLANT Left 08/04/2018   Procedure: REMOVAL OF TISSUE EXPANDER AND PLACEMENT OF IMPLANT;  Surgeon: Wallace Going, DO;  Location: [facility] OR[facility];  Service: Plastics;  Laterality: Left;  . UMBILICAL HERNIA REPAIR  1983 and 1985      Current Outpatient Medications:  .  atorvastatin (LIPITOR) 40 MG tablet, Take 40 mg by mouth at bedtime. , Disp: , Rfl:  .  Biotin 5 MG TABS, Take 5 mg by mouth daily., Disp: , Rfl:  .  Chromium Picolinate (CHROMIUM PICOLATE PO), Take 1,000 mcg by mouth daily., Disp: , Rfl:  .  citalopram (CELEXA)  40 MG tablet, Take 40 mg by mouth every morning. , Disp: , Rfl:  .  doxazosin (CARDURA) 1 MG tablet, Take 1 tablet (1 mg total) by mouth 2 (two) times daily., Disp: 180 tablet, Rfl: 3 .  famotidine (PEPCID) 20 MG tablet, Take 1 tablet (20 mg total) by mouth daily. (Patient taking differently: Take 20 mg by mouth every morning. ), Disp: 90 tablet, Rfl: 0 .  Fluticasone-Salmeterol (WIXELA INHUB) 250-50 MCG/DOSE AEPB, Inhale 1 puff into the lungs 2 (two) times daily. , Disp: , Rfl:  .  gabapentin (NEURONTIN) 100 MG capsule, Take 200 mg by mouth 3 (three) times daily., Disp: , Rfl:  .   GLUCOSAMINE-CHONDROITIN PO, Take 2 tablets by mouth daily. Take 2 tablets by mouth daily., Disp: , Rfl:  .  hydrochlorothiazide (HYDRODIURIL) 25 MG tablet, Take 25 mg by mouth daily. , Disp: , Rfl:  .  hydrOXYzine (ATARAX/VISTARIL) 25 MG tablet, TAKE 1 TABLET (25 MG TOTAL) BY MOUTH 3 (THREE) TIMES DAILY AS NEEDED FOR ANXIETY. (Patient taking differently: Take 25 mg by mouth 3 (three) times daily. ), Disp: 90 tablet, Rfl: 0 .  levothyroxine (SYNTHROID, LEVOTHROID) 50 MCG tablet, Take 50 mcg by mouth every other day. , Disp: , Rfl:  .  levothyroxine (SYNTHROID, LEVOTHROID) 75 MCG tablet, Take 75 mcg by mouth every other day., Disp: , Rfl:  .  losartan (COZAAR) 50 MG tablet, Take 50 mg by mouth daily., Disp: , Rfl:  .  metoprolol (LOPRESSOR) 50 MG tablet, Take 1 tablet (50 mg total) by mouth 2 (two) times daily., Disp: 60 tablet, Rfl: 0 .  naproxen (NAPROSYN) 500 MG tablet, Take 500 mg by mouth 2 (two) times daily as needed for moderate pain., Disp: , Rfl:  .  QUEtiapine (SEROQUEL) 50 MG tablet, Take 50 mg by mouth at bedtime., Disp: , Rfl:  .  fluconazole (DIFLUCAN) 150 MG tablet, Take 1 tablet (150 mg total) by mouth once for 1 dose., Disp: 1 tablet, Rfl: 1   Objective:   Vitals:   08/12/18 1240 08/12/18 1303  BP: (!) 168/113 (!) 166/107  Pulse: 75   Temp: 97.7 F (36.5 C)   SpO2: 98%     Physical Exam  General: alert, calm, no acute distress HEENT: normal Neck: supple, full ROM Chest: symmetrical rise and fall Lungs: unlabored breathing Breast: right breast incisions edematous with bruising; incisions clean, dry, approximated; left breast soft, incision clean, dry, intact  Musculoskeletal: MAEx4 Neuro: A&O x3, calm, cooperative, steady gait Skin: scabbed skin tear on medial aspect of right breast   Assessment & Plan:  Kelli Logan is a 58 yo female POD #8 s/p removal of left breast tissue expander, left breast silicone implant placement, and right breast mastopexy. She has some  swelling and bruising of the right breast, which is expected. She is doing quite well otherwise. Taking the right breast swelling into account, she appears very symmetrical. No signs of infection or seroma. She may take benadryl for itching. She has a vaginal candidiasis infection secondary to antibiotics. Sent prescription for Diflucan.   Return in 3 weeks.     Alfredo Batty, NP

## 2018-08-19 ENCOUNTER — Other Ambulatory Visit: Payer: Self-pay | Admitting: Physical Medicine and Rehabilitation

## 2018-08-19 ENCOUNTER — Other Ambulatory Visit (HOSPITAL_COMMUNITY): Payer: Self-pay | Admitting: Physical Medicine and Rehabilitation

## 2018-08-19 DIAGNOSIS — M7061 Trochanteric bursitis, right hip: Secondary | ICD-10-CM

## 2018-08-26 ENCOUNTER — Ambulatory Visit: Payer: Medicare Other

## 2018-08-26 ENCOUNTER — Other Ambulatory Visit: Payer: Self-pay

## 2018-08-26 DIAGNOSIS — R0609 Other forms of dyspnea: Secondary | ICD-10-CM

## 2018-08-26 DIAGNOSIS — J343 Hypertrophy of nasal turbinates: Secondary | ICD-10-CM | POA: Insufficient documentation

## 2018-08-26 DIAGNOSIS — J342 Deviated nasal septum: Secondary | ICD-10-CM | POA: Insufficient documentation

## 2018-08-26 DIAGNOSIS — G4719 Other hypersomnia: Secondary | ICD-10-CM

## 2018-08-26 DIAGNOSIS — J3489 Other specified disorders of nose and nasal sinuses: Secondary | ICD-10-CM | POA: Insufficient documentation

## 2018-08-27 ENCOUNTER — Telehealth: Payer: Self-pay | Admitting: Gastroenterology

## 2018-08-27 NOTE — Telephone Encounter (Signed)
Pt left vm she states Dr. Bonna Gains  Put her on rx  Famotidine 20 mg and she was already taken another antacid rx she states she had tryed both but feels that the  Famotidine is not working and she would like to try to get of it please call pt to discuss

## 2018-08-27 NOTE — Telephone Encounter (Signed)
Please advise 

## 2018-08-28 ENCOUNTER — Ambulatory Visit
Admission: RE | Admit: 2018-08-28 | Discharge: 2018-08-28 | Disposition: A | Discharge status: Home / Self Care | Payer: Medicare Other | Source: Ambulatory Visit | Attending: Physical Medicine and Rehabilitation | Admitting: Physical Medicine and Rehabilitation

## 2018-08-28 ENCOUNTER — Other Ambulatory Visit: Payer: Self-pay

## 2018-08-28 DIAGNOSIS — M7062 Trochanteric bursitis, left hip: Secondary | ICD-10-CM | POA: Diagnosis present

## 2018-08-28 DIAGNOSIS — M7061 Trochanteric bursitis, right hip: Secondary | ICD-10-CM

## 2018-08-28 NOTE — Telephone Encounter (Signed)
Left detailed message for pt regarding Dr. Michele Mcalpine recommendation as advised below.

## 2018-08-29 ENCOUNTER — Telehealth: Payer: Self-pay | Admitting: Internal Medicine

## 2018-08-29 ENCOUNTER — Ambulatory Visit: Payer: Medicare Other | Admitting: Licensed Clinical Social Worker

## 2018-08-29 DIAGNOSIS — G4733 Obstructive sleep apnea (adult) (pediatric): Secondary | ICD-10-CM

## 2018-08-29 NOTE — Telephone Encounter (Signed)
HST preformed on 08/26/2018 confirmed severe OSA with AHI of 37, and very severe is supine position.  Recommend auto cpap 5-20cm h2O and avoidance of supine position.  Order has been placed, as pt wished to proceed. Pt will call back to schedule an appt after cpap setup.  Nothing further is needed.

## 2018-09-01 ENCOUNTER — Telehealth: Payer: Self-pay

## 2018-09-01 NOTE — Telephone Encounter (Signed)

## 2018-09-02 ENCOUNTER — Encounter: Payer: Self-pay | Admitting: Nurse Practitioner

## 2018-09-02 ENCOUNTER — Other Ambulatory Visit: Payer: Self-pay

## 2018-09-02 ENCOUNTER — Ambulatory Visit (INDEPENDENT_AMBULATORY_CARE_PROVIDER_SITE_OTHER): Payer: Medicare Other | Admitting: Nurse Practitioner

## 2018-09-02 VITALS — BP 173/101 | HR 82 | Temp 97.3°F | Ht 66.25 in | Wt 190.0 lb

## 2018-09-02 DIAGNOSIS — Z9882 Breast implant status: Secondary | ICD-10-CM

## 2018-09-02 DIAGNOSIS — Z9012 Acquired absence of left breast and nipple: Secondary | ICD-10-CM
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
## 2018-09-04 ENCOUNTER — Other Ambulatory Visit: Payer: Self-pay

## 2018-09-05 ENCOUNTER — Other Ambulatory Visit: Payer: Self-pay

## 2018-09-05 ENCOUNTER — Inpatient Hospital Stay: Payer: Medicare Other | Attending: Internal Medicine | Admitting: Internal Medicine

## 2018-09-05 DIAGNOSIS — C50512 Malignant neoplasm of lower-outer quadrant of left female breast: Secondary | ICD-10-CM | POA: Diagnosis not present

## 2018-09-05 DIAGNOSIS — Z9011 Acquired absence of right breast and nipple: Secondary | ICD-10-CM | POA: Insufficient documentation

## 2018-09-05 DIAGNOSIS — Z87891 Personal history of nicotine dependence: Secondary | ICD-10-CM | POA: Insufficient documentation

## 2018-09-05 DIAGNOSIS — Z79899 Other long term (current) drug therapy: Secondary | ICD-10-CM | POA: Insufficient documentation

## 2018-09-05 DIAGNOSIS — Z170 Estrogen receptor positive status [ER+]: Secondary | ICD-10-CM

## 2018-09-05 DIAGNOSIS — I10 Essential (primary) hypertension: Secondary | ICD-10-CM | POA: Insufficient documentation

## 2018-09-05 DIAGNOSIS — F329 Major depressive disorder, single episode, unspecified: Secondary | ICD-10-CM | POA: Diagnosis not present
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
## 2018-09-05 NOTE — Assessment & Plan Note (Addendum)
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  LOW risk-Oncotype.  Stable.  Currently off letrozole because of severe intolerance.  [severe fatigue/joint pains hot flashes/post coital pain]/.   #Long discussion the patient regarding the importance of antihormone therapy-in prevention of recurrence of her breast cancer.  With endocrine therapy-based on Oncotype-risk of recurrence is around 6%.  However without endocrine therapy there is good but definitely be high.  I encouraged the patient to think about trying alternative endocrine therapy like Aromasin.  Patient wants to wait for now.   #Left mastectomy; Right mastopexy-followed by plastic surgery.  # DISPOSITION:  # follow up in 1st week of dec 2020- MD; no labs--Dr.B

## 2018-09-09 ENCOUNTER — Telehealth: Payer: Self-pay | Admitting: Internal Medicine

## 2018-09-09 ENCOUNTER — Telehealth: Payer: Self-pay

## 2018-09-09 ENCOUNTER — Other Ambulatory Visit: Payer: Self-pay

## 2018-09-09 NOTE — Telephone Encounter (Signed)
Pt is aware of date/time of covid testing. Nothing further is needed.  

## 2018-09-09 NOTE — Telephone Encounter (Signed)
Received call from patient who states that she is having some numbness on the left side where reconstruction was completed. Patient states numbness extends to shoulder blade as well as her arm at times. Patient requesting call back after speaking with clinical staff

## 2018-09-11 NOTE — Telephone Encounter (Signed)
Called and spoke with the patient on (09/09/18) regarding the message below.  Asked the patient if she was having any facial numbness or drooping.  Patient stated no.  Just the numbness where she had the reconstruction and it extends to the (L) shoulder blade and sometimes the arm.  Informed the patient that I spoke with Mayah,NP and she stated that the numbness is normal, but she feels she needs to go and see her PCP because of her elevated BP.  Patient stated that her BP medication has been changed and they are working on trying to get her on the right medication.  Patient state that she would call her PCP.  Patient verbalized understanding and agreed.//AB/CMA

## 2018-09-12 ENCOUNTER — Other Ambulatory Visit
Admission: RE | Admit: 2018-09-12 | Discharge: 2018-09-12 | Disposition: A | Discharge status: Home / Self Care | Payer: Medicare Other | Source: Ambulatory Visit | Attending: Internal Medicine | Admitting: Internal Medicine

## 2018-09-12 DIAGNOSIS — Z20828 Contact with and (suspected) exposure to other viral communicable diseases: Secondary | ICD-10-CM | POA: Diagnosis not present

## 2018-09-12 DIAGNOSIS — Z01812 Encounter for preprocedural laboratory examination: Secondary | ICD-10-CM | POA: Diagnosis present

## 2018-09-13 LAB — SARS CORONAVIRUS 2 (TAT 6-24 HRS): SARS Coronavirus 2: NEGATIVE

## 2018-09-16 ENCOUNTER — Other Ambulatory Visit: Payer: Self-pay

## 2018-09-16 ENCOUNTER — Ambulatory Visit: Payer: Medicare Other | Attending: Internal Medicine

## 2018-09-16 ENCOUNTER — Ambulatory Visit: Payer: Self-pay | Admitting: Psychiatry

## 2018-09-16 DIAGNOSIS — R0609 Other forms of dyspnea: Secondary | ICD-10-CM | POA: Diagnosis not present

## 2018-09-16 MED ORDER — ALBUTEROL SULFATE (2.5 MG/3ML) 0.083% IN NEBU
2.5000 mg | INHALATION_SOLUTION | Freq: Once | RESPIRATORY_TRACT | Status: AC
  Administered 2018-09-16: 2.5 mg via RESPIRATORY_TRACT
  Filled 2018-09-16: qty 3

## 2018-09-17 ENCOUNTER — Encounter: Payer: Self-pay | Admitting: Licensed Clinical Social Worker

## 2018-09-17 ENCOUNTER — Ambulatory Visit (INDEPENDENT_AMBULATORY_CARE_PROVIDER_SITE_OTHER): Payer: Medicare Other | Admitting: Licensed Clinical Social Worker

## 2018-09-17 DIAGNOSIS — F4310 Post-traumatic stress disorder, unspecified: Secondary | ICD-10-CM
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
## 2018-09-18 ENCOUNTER — Other Ambulatory Visit: Payer: Self-pay

## 2018-09-18 ENCOUNTER — Ambulatory Visit (INDEPENDENT_AMBULATORY_CARE_PROVIDER_SITE_OTHER): Payer: Medicare Other | Admitting: Psychiatry

## 2018-09-18 ENCOUNTER — Encounter: Payer: Self-pay | Admitting: Psychiatry

## 2018-09-18 DIAGNOSIS — F4010 Social phobia, unspecified: Secondary | ICD-10-CM | POA: Diagnosis not present

## 2018-09-18 DIAGNOSIS — F4310 Post-traumatic stress disorder, unspecified: Secondary | ICD-10-CM | POA: Diagnosis not present

## 2018-09-18 DIAGNOSIS — F4312 Post-traumatic stress disorder, chronic: Secondary | ICD-10-CM | POA: Insufficient documentation

## 2018-09-18 DIAGNOSIS — F424 Excoriation (skin-picking) disorder: Secondary | ICD-10-CM

## 2018-09-18 DIAGNOSIS — F410 Panic disorder [episodic paroxysmal anxiety] without agoraphobia: Secondary | ICD-10-CM | POA: Insufficient documentation

## 2018-09-18 DIAGNOSIS — F3160 Bipolar disorder, current episode mixed, unspecified: Secondary | ICD-10-CM | POA: Insufficient documentation

## 2018-09-18 MED ORDER — HYDROXYZINE HCL 25 MG PO TABS: 25.0000 mg | ORAL_TABLET | Freq: Three times a day (TID) | ORAL | 2 refills | Status: DC | PRN

## 2018-09-18 MED ORDER — CITALOPRAM HYDROBROMIDE 40 MG PO TABS: 40.0000 mg | ORAL_TABLET | ORAL | 0 refills | Status: DC

## 2018-09-18 MED ORDER — GABAPENTIN 600 MG PO TABS: 600.0000 mg | ORAL_TABLET | Freq: Every day | ORAL | 0 refills | Status: DC
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
## 2018-09-22 ENCOUNTER — Telehealth: Payer: Self-pay

## 2018-09-22 NOTE — Telephone Encounter (Signed)
Patient is calling because she states she is on Pepcid 20mg  twice a day and also is taking the omeprazole. Patient states we wanted her to get off the omeprazole. Patient tried to not take the omeprazole this morning and her heartburn came on a hour after she was supposed to start the medication. Please advised if it is okay for her to take both medications

## 2018-09-29 ENCOUNTER — Telehealth: Payer: Self-pay

## 2018-09-29 NOTE — Telephone Encounter (Signed)
Patient verbalized understanding. Made appointment 11/19/18

## 2018-09-29 NOTE — Telephone Encounter (Signed)

## 2018-09-30 ENCOUNTER — Ambulatory Visit (INDEPENDENT_AMBULATORY_CARE_PROVIDER_SITE_OTHER): Payer: Medicare Other | Admitting: Plastic Surgery

## 2018-09-30 ENCOUNTER — Other Ambulatory Visit: Payer: Self-pay

## 2018-09-30 ENCOUNTER — Encounter: Payer: Self-pay | Admitting: Plastic Surgery

## 2018-09-30 VITALS — BP 126/77 | HR 88 | Temp 97.5°F | Ht 66.0 in | Wt 190.0 lb

## 2018-09-30 DIAGNOSIS — Z9012 Acquired absence of left breast and nipple: Secondary | ICD-10-CM

## 2018-09-30 NOTE — Progress Notes (Signed)
   Subjective:    Patient ID: Kelli Logan, female    DOB: 02/16/1960, 58 y.o.   MRN: [id number]  The patient is a 58 year old female here for follow-up on her breast reconstruction.  Overall she is doing very well.  She notices some changes in the left breast with some tingling.  This is most likely the nerve supply regenerating.  She also notices sometimes the nipple looks like it is not prominent and other times looks prominent.  This change is likely to continue for the next several months.  There is no sign of infection and all incisions are healing very well.  She does have some loss of fullness upper left breast and some fullness in the lower right breast.  She looks very good and symmetric with her shirt on.   Review of Systems  Constitutional: Negative for activity change.  Respiratory: Negative for apnea.   Cardiovascular: Negative for leg swelling.  Gastrointestinal: Negative for abdominal pain.  Endocrine: Negative.   Genitourinary: Negative.   Musculoskeletal: Negative.   Psychiatric/Behavioral: Negative.        Objective:   Physical Exam Vitals signs and nursing note reviewed.  Constitutional:      Appearance: Normal appearance.  Cardiovascular:     Rate and Rhythm: Normal rate.  Pulmonary:     Effort: Pulmonary effort is normal.  Neurological:     General: No focal deficit present.     Mental Status: She is alert and oriented to person, place, and time.  Psychiatric:        Mood and Affect: Mood normal.        Behavior: Behavior normal.        Thought Content: Thought content normal.        Assessment & Plan:     ICD-10-CM   1. Status post left mastectomy  Z90.12   2. Acquired absence of left breast  Z90.12     Pictures were obtained of the patient and placed in the chart with the patient's or guardian's permission. Continue massage to the left breast.  Most likely will need some fat grafting.  She has some nasal surgery scheduled so we would do this  after that most likely in November or December.

## 2018-10-04 ENCOUNTER — Other Ambulatory Visit: Payer: Self-pay | Admitting: Gastroenterology

## 2018-10-06 NOTE — Telephone Encounter (Signed)
Has appointment 11/19/18 Last office visit 06/12/2018  Last refill 07/15/2018 0 refills

## 2018-10-23 ENCOUNTER — Other Ambulatory Visit: Payer: Self-pay

## 2018-10-23 ENCOUNTER — Ambulatory Visit (INDEPENDENT_AMBULATORY_CARE_PROVIDER_SITE_OTHER): Payer: Medicare Other | Admitting: Psychiatry

## 2018-10-23 ENCOUNTER — Encounter: Payer: Self-pay | Admitting: Psychiatry

## 2018-10-23 DIAGNOSIS — F4010 Social phobia, unspecified: Secondary | ICD-10-CM | POA: Diagnosis not present

## 2018-10-23 DIAGNOSIS — F424 Excoriation (skin-picking) disorder: Secondary | ICD-10-CM | POA: Diagnosis not present

## 2018-10-23 DIAGNOSIS — F4310 Post-traumatic stress disorder, unspecified: Secondary | ICD-10-CM

## 2018-10-23 DIAGNOSIS — F410 Panic disorder [episodic paroxysmal anxiety] without agoraphobia: Secondary | ICD-10-CM

## 2018-10-23 MED ORDER — ZOLPIDEM TARTRATE 5 MG PO TABS: 5.0000 mg | ORAL_TABLET | Freq: Every evening | ORAL | 1 refills | Status: DC | PRN

## 2018-10-23 MED ORDER — QUETIAPINE FUMARATE 50 MG PO TABS: 150.0000 mg | ORAL_TABLET | Freq: Every day | ORAL | 0 refills | Status: DC

## 2018-10-23 NOTE — Progress Notes (Signed)
Virtual Visit via Video Note  I connected with Kelli Logan on 10/23/18 at  2:00 PM EDT by a video enabled telemedicine application and verified that I am speaking with the correct person using two identifiers.   I discussed the limitations of evaluation and management by telemedicine and the availability of in person appointments. The patient expressed understanding and agreed to proceed.   I discussed the assessment and treatment plan with the patient. The patient was provided an opportunity to ask questions and all were answered. The patient agreed with the plan and demonstrated an understanding of the instructions.   The patient was advised to call back or seek an in-person evaluation if the symptoms worsen or if the condition fails to improve as anticipated.    [facility] MD OP Progress Note  10/23/2018 2:32 PM Kelli Logan  MRN:  [id number]  Chief Complaint:  Chief Complaint    Follow-up     HPI: Kelli Logan is a 58 year old Caucasian female, divorced, lives in [place], has a history of PTSD, skin picking disorder, panic attacks, breast cancer, kidney disease, thyroid disease was evaluated by telemedicine today.  Patient today reports that mood wise she is improving.  The gabapentin and Seroquel does help.  She is also compliant on the citalopram.  She denies any side effects to the medications.  Patient however reports she continues to struggle with sleep problems on and off.  She is on Seroquel 150 mg-provided by neurology.  She reports in spite of that there are nights when she has a lot of racing thoughts and she is unable to sleep.  She reports her pain also keeps her up at night at times.  She has tried Ambien in the past which may have helped.  Patient denies any suicidality, homicidality or perceptual disturbances.  Patient reports she continues to have skin picking and there are several scabs on her lower extremities.  She has not been able to follow-up with her therapist recently.   Encouraged her to call the therapist for an appointment.  Patient at this time he is scheduled to have a nasal surgery.  She reports she has to go for the pre- op testing including COVID testing soon.  She reports there is a friend who may be able to take her and provide transportation.That does make her anxious .  Patient denies any other concerns today.   Visit Diagnosis:    ICD-10-CM   1. PTSD (post-traumatic stress disorder)  F43.10 zolpidem (AMBIEN) 5 MG tablet  2. Skin-picking disorder  F42.4   3. Panic disorder  F41.0   4. Social anxiety disorder  F40.10     Past Psychiatric History: Reviewed past psychiatric history from my progress note on 03/10/2018.  Past trials of Paxil, Prozac, Zoloft, Lamictal.  Past Medical History:  Past Medical History:  Diagnosis Date  . Anxiety   . Arthritis   . Cancer ([place])   . Depression   . Fibromyalgia   . History of kidney stones    h/o  . Hypertension   . Thyroid disease     Past Surgical History:  Procedure Laterality Date  . APPENDECTOMY    . BREAST BIOPSY Left few yrs ago   stereotactic bx in [place], benign  . BREAST BIOPSY Left 12/26/2017   Affirm Bx IMC- X-Clip  . BREAST BIOPSY Left 01/14/2018   Affirm Bx- Coil clip- path pending  . BREAST RECONSTRUCTION WITH PLACEMENT OF TISSUE EXPANDER AND ALLODERM Left 04/08/2018   Procedure: LEFT BREAST  IMMEDIATE  RECONSTRUCTION WITH EXPANDER AND FLEX HD;  Surgeon: Wallace Going, DO;  Location: [facility];  Service: Plastics;  Laterality: Left;  . COLONOSCOPY WITH PROPOFOL N/A 06/26/2018   Procedure: COLONOSCOPY WITH PROPOFOL;  Surgeon: Virgel Manifold, MD;  Location: [facility];  Service: Endoscopy;  Laterality: N/A;  . ESOPHAGOGASTRODUODENOSCOPY (EGD) WITH PROPOFOL N/A 06/26/2018   Procedure: ESOPHAGOGASTRODUODENOSCOPY (EGD) WITH PROPOFOL;  Surgeon: Virgel Manifold, MD;  Location: [facility];  Service: Endoscopy;  Laterality: N/A;  . FOOT BONE EXCISION    . MASTECTOMY     . MASTECTOMY W/ SENTINEL NODE BIOPSY Left 04/08/2018   Procedure: MASTECTOMY WITH SENTINEL LYMPH NODE BIOPSY LEFT;  Surgeon: Fredirick Maudlin, MD;  Location: [facility];  Service: General;  Laterality: Left;  Marland Kitchen MASTOPEXY Right 08/04/2018   Procedure: MASTOPEXY;  Surgeon: Wallace Going, DO;  Location: [facility];  Service: Plastics;  Laterality: Right;  TOTAL CASE TIME SHOULD BE 3 HOURS, PLEASE  . REMOVAL OF TISSUE EXPANDER AND PLACEMENT OF IMPLANT Left 08/04/2018   Procedure: REMOVAL OF TISSUE EXPANDER AND PLACEMENT OF IMPLANT;  Surgeon: Wallace Going, DO;  Location: [facility];  Service: Plastics;  Laterality: Left;  . UMBILICAL HERNIA REPAIR  1983 and 1985    Family Psychiatric History: Reviewed family psychiatric history from my progress note on 03/10/2018. Family History:  Family History  Problem Relation Age of Onset  . Hypertension Mother   . Lung cancer Maternal Grandmother   . Heart disease Father   . Alcohol abuse Father   . Breast cancer Neg Hx   . Colon cancer Neg Hx     Social History: Reviewed social history from my progress note on 03/10/2018. Social History   Socioeconomic History  . Marital status: Divorced    Spouse name: Not on file  . Number of children: 1  . Years of education: Not on file  . Highest education level: High school graduate  Occupational History  . Not on file  Social Needs  . Financial resource strain: Very hard  . Food insecurity    Worry: Often true    Inability: Often true  . Transportation needs    Medical: Yes    Non-medical: Yes  Tobacco Use  . Smoking status: Former Smoker    Packs/day: 1.00    Years: 2.00    Pack years: 2.00    Types: Cigarettes    Quit date: 01/06/2017    Years since quitting: 1.7  . Smokeless tobacco: Never Used  Substance and Sexual Activity  . Alcohol use: Yes    Comment: rare beer   . Drug use: Not Currently  . Sexual activity: Not on file  Lifestyle  . Physical activity    Days per week: 0  days    Minutes per session: 0 min  . Stress: Very much  Relationships  . Social Herbalist on phone: Not on file    Gets together: Not on file    Attends religious service: Never    Active member of club or organization: No    Attends meetings of clubs or organizations: Never    Relationship status: Divorced  Other Topics Concern  . 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 Quit smoking; ocassional alcohol.     Allergies:  Allergies  Allergen Reactions  . Tape Other (See Comments)    Burned skin  Metabolic Disorder Labs: No results found for: HGBA1C, MPG No results found for: PROLACTIN No results found for: CHOL, TRIG, HDL, CHOLHDL, VLDL, LDLCALC No results found for: TSH  Therapeutic Level Labs: No results found for: LITHIUM No results found for: VALPROATE No components found for:  CBMZ  Current Medications: Current Outpatient Medications  Medication Sig Dispense Refill  . atorvastatin (LIPITOR) 40 MG tablet Take 40 mg by mouth at bedtime.     . Biotin 5 MG TABS Take 5 mg by mouth daily.    . Chromium Picolinate (CHROMIUM PICOLATE PO) Take 1,000 mcg by mouth daily.    . citalopram (CELEXA) 40 MG tablet Take 1 tablet (40 mg total) by mouth every morning. 90 tablet 0  . doxazosin (CARDURA) 1 MG tablet Take 1 tablet (1 mg total) by mouth 2 (two) times daily. 180 tablet 3  . famotidine (PEPCID) 20 MG tablet TAKE 1 TABLET BY MOUTH EVERY DAY 90 tablet 0  . Fluticasone-Salmeterol (WIXELA INHUB) 250-50 MCG/DOSE AEPB Inhale 1 puff into the lungs 2 (two) times daily.     Marland Kitchen gabapentin (NEURONTIN) 600 MG tablet Take 1 tablet (600 mg total) by mouth at bedtime. 90 tablet 0  . Glucosamine-Chondroitin 1500-1200 MG/30ML LIQD Take by mouth.    Marland Kitchen GLUCOSAMINE-CHONDROITIN PO Take 2 tablets by mouth daily. Take 2 tablets by mouth daily.    .  hydrochlorothiazide (HYDRODIURIL) 25 MG tablet Take 25 mg by mouth daily.     . hydrOXYzine (ATARAX/VISTARIL) 25 MG tablet Take 1 tablet (25 mg total) by mouth 3 (three) times daily as needed for anxiety. 90 tablet 2  . levothyroxine (SYNTHROID, LEVOTHROID) 50 MCG tablet Take 50 mcg by mouth every other day.     . levothyroxine (SYNTHROID, LEVOTHROID) 75 MCG tablet Take 75 mcg by mouth every other day.    . losartan (COZAAR) 100 MG tablet Take 100 mg by mouth daily. for high blood pressure    . losartan (COZAAR) 50 MG tablet Take 50 mg by mouth daily.    . metoprolol (LOPRESSOR) 50 MG tablet Take 1 tablet (50 mg total) by mouth 2 (two) times daily. 60 tablet 0  . naproxen (NAPROSYN) 500 MG tablet Take 500 mg by mouth 2 (two) times daily as needed for moderate pain.    Marland Kitchen omeprazole (PRILOSEC) 20 MG capsule Take by mouth.    . QUEtiapine (SEROQUEL) 50 MG tablet Take 3 tablets (150 mg total) by mouth at bedtime. PRESCRIBED BY NEUROLOGY 90 tablet 0  . tiZANidine (ZANAFLEX) 4 MG tablet TAKE 1 TABLET (4 MG TOTAL) BY MOUTH 3 (THREE) TIMES DAILY AS NEEDED    . traMADol (ULTRAM) 50 MG tablet Take by mouth.    . zolpidem (AMBIEN) 5 MG tablet Take 1 tablet (5 mg total) by mouth at bedtime as needed for sleep. 30 tablet 1   No current facility-administered medications for this visit.      Musculoskeletal: Strength & Muscle Tone: UTA Gait & Station: normal Patient leans: N/A  Psychiatric Specialty Exam: Review of Systems  Musculoskeletal: Positive for joint pain.  Psychiatric/Behavioral: The patient is nervous/anxious and has insomnia.   All other systems reviewed and are negative.   There were no vitals taken for this visit.There is no height or weight on file to calculate BMI.  General Appearance: Casual  Eye Contact:  Fair  Speech:  Clear and Coherent  Volume:  Normal  Mood:  Anxious  Affect:  Congruent  Thought Process:  Goal Directed and  Descriptions of Associations: Intact  Orientation:   Full (Time, Place, and Person)  Thought Content: Logical   Suicidal Thoughts:  No  Homicidal Thoughts:  No  Memory:  Immediate;   Fair Recent;   Fair Remote;   Fair  Judgement:  Fair  Insight:  Fair  Psychomotor Activity:  Normal  Concentration:  Concentration: Fair and Attention Span: Fair  Recall:  [employer] of Knowledge: Fair  Language: Fair  Akathisia:  No  Handed:  Right  AIMS (if indicated): UTA  Assets:  Communication Skills Desire for Improvement Housing Social Support  ADL's:  Intact  Cognition: WNL  Sleep:  Restless   Screenings:   Assessment and Plan: Kelli Logan is a 58 year old Caucasian female who is divorced, lives in [place], has a history of PTSD, skin picking disorder, panic attacks, social anxiety disorder, breast cancer status post mastectomy and reconstruction surgery, chronic pain was evaluated by telemedicine today.  She is biologically predisposed given her family history as well as history of trauma and multiple health issues.  She also has psychosocial stressors of financial problems, being lonely.  Patient continues to struggle with sleep problems more so because of her pain.  Patient will continue to benefit from medication management.  Plan PTSD-improving Celexa as prescribed Gabapentin 600 mg p.o. nightly Continue Seroquel 150 mg - as prescribed per neurology. Continue psychotherapy sessions. She had sleep study done-pending report Start Ambien 5 mg p.o. nightly as needed for sleep.  Advised her to limit use and use it only couple of times a week.   Social anxiety disorder-improving Continue to work with her therapist  Panic attacks-improving Hydroxyzine as prescribed Continue CBT-patient encouraged to call her therapist to schedule an appointment.  Skin picking disorder-unstable Continue CBT.   Pending labs-TSH, lipid panel, hemoglobin A1c, prolactin, CBC, CMP.  She reports she had all her labs done at her primary care office and she  will fax it to [place].  Follow-up in clinic in 6 to 8 weeks or sooner if needed.  December 15 at 2:30 PM  I have spent atleast 15 minutes non face to face with patient today. More than 50 % of the time was spent for psychoeducation and supportive psychotherapy and care coordination. This note was generated in part or whole with voice recognition software. Voice recognition is usually quite accurate but there are transcription errors that can and very often do occur. I apologize for any typographical errors that were not detected and corrected.      Ursula Alert, MD 10/23/2018, 2:32 PM

## 2018-10-24 ENCOUNTER — Telehealth: Payer: Self-pay | Admitting: Psychiatry

## 2018-10-24 NOTE — Telephone Encounter (Signed)
Reviewed the following labs - dated 10/02/2018 BMP Glucose - high at 192 Potassium - 3.4 - low Chloride - 92- low AST-WNL ALT - High at 51 Otherwise WNL  Lipid panel -wnl  TSH - 0.859 - WNL

## 2018-11-04 ENCOUNTER — Other Ambulatory Visit: Payer: Self-pay | Admitting: Psychiatry

## 2018-11-04 DIAGNOSIS — F4310 Post-traumatic stress disorder, unspecified: Secondary | ICD-10-CM

## 2018-11-19 ENCOUNTER — Ambulatory Visit (INDEPENDENT_AMBULATORY_CARE_PROVIDER_SITE_OTHER): Payer: Medicare Other | Admitting: Gastroenterology

## 2018-11-19 ENCOUNTER — Encounter: Payer: Self-pay | Admitting: Gastroenterology

## 2018-11-19 DIAGNOSIS — K219 Gastro-esophageal reflux disease without esophagitis: Secondary | ICD-10-CM | POA: Diagnosis not present

## 2018-11-19 DIAGNOSIS — K5900 Constipation, unspecified: Secondary | ICD-10-CM

## 2018-11-19 NOTE — Progress Notes (Signed)
[name], MD [address]  [place]  [place], [place] [address]  Main: [phone]  Fax: [phone]   Primary Care Physician: Gennette Pac, [place]  Virtual Visit via Video Note  I connected with patient on 11/19/18 at  2:00 PM EST by video (using [email or web address]) and verified that I am speaking with the correct person using two identifiers.   I discussed the limitations, risks, security and privacy concerns of performing an evaluation and management service by video and the availability of in person appointments. I also discussed with the patient that there may be a patient responsible charge related to this service. The patient expressed understanding and agreed to proceed.  Location of Patient: Home Location of Provider: Home Persons involved: Patient and provider only (Nursing staff checked in patient via phone but were not physically involved in the video interaction - see their notes)   History of Present Illness: Chief Complaint  Patient presents with  . Gastroesophageal Reflux    Patient states her symptoms are controlled with the medication     HPI: Kelli Logan is a 58 y.o. female here for follow-up of GERDAnd constipation.  Both problems are well controlled.  Taking omeprazole 20 mg once daily for GERD.  Has a small hiatal hernia.  Was tried on Pepcid instead of omeprazole but did not do well and thus was switched back to omeprazole.  Constipation has resolved since her last colonoscopy.  Is trying to eat high-fiber diet.  Satisfied with her daily bowel movements that are not hard  EGD June 2020 showed small hiatal hernia and few gastric polyps.  Otherwise normal  Colonoscopy June 2020, with 2 subcentimeter polyps removed, nonbleeding hemorrhoids noted, otherwise normal exam.  Repeat recommended in 5 years  Pathology results showed benign fundic gland polyp in the stomach.  Esophagus biopsies did not show EOE.  Colon polyp showed tubular adenoma and  lymphoid aggregate  Current Outpatient Medications  Medication Sig Dispense Refill  . atorvastatin (LIPITOR) 40 MG tablet Take 40 mg by mouth at bedtime.     . Biotin 5 MG TABS Take 5 mg by mouth daily.    . Chromium Picolinate (CHROMIUM PICOLATE PO) Take 1,000 mcg by mouth daily.    . citalopram (CELEXA) 40 MG tablet Take 1 tablet (40 mg total) by mouth every morning. 90 tablet 0  . doxazosin (CARDURA) 1 MG tablet Take 1 tablet (1 mg total) by mouth 2 (two) times daily. 180 tablet 3  . Fluticasone-Salmeterol (WIXELA INHUB) 250-50 MCG/DOSE AEPB Inhale 1 puff into the lungs 2 (two) times daily.     Marland Kitchen gabapentin (NEURONTIN) 600 MG tablet Take 1 tablet (600 mg total) by mouth at bedtime. 90 tablet 0  . Glucosamine-Chondroitin 1500-1200 MG/30ML LIQD Take by mouth.    Marland Kitchen GLUCOSAMINE-CHONDROITIN PO Take 2 tablets by mouth daily. Take 2 tablets by mouth daily.    . hydrochlorothiazide (HYDRODIURIL) 25 MG tablet Take 25 mg by mouth daily.     . hydrOXYzine (ATARAX/VISTARIL) 25 MG tablet TAKE 1 TABLET (25 MG TOTAL) BY MOUTH 3 (THREE) TIMES DAILY AS NEEDED FOR ANXIETY. 270 tablet 1  . levothyroxine (SYNTHROID, LEVOTHROID) 50 MCG tablet Take 50 mcg by mouth every other day.     . levothyroxine (SYNTHROID, LEVOTHROID) 75 MCG tablet Take 75 mcg by mouth every other day.    . losartan (COZAAR) 100 MG tablet Take 100 mg by mouth daily. for high blood pressure    . metoprolol (LOPRESSOR) 50  MG tablet Take 1 tablet (50 mg total) by mouth 2 (two) times daily. 60 tablet 0  . naproxen (NAPROSYN) 500 MG tablet Take 500 mg by mouth 2 (two) times daily as needed for moderate pain.    Marland Kitchen omeprazole (PRILOSEC) 20 MG capsule Take by mouth.    . QUEtiapine (SEROQUEL) 50 MG tablet Take 3 tablets (150 mg total) by mouth at bedtime. PRESCRIBED BY NEUROLOGY 90 tablet 0  . tiZANidine (ZANAFLEX) 4 MG tablet TAKE 1 TABLET (4 MG TOTAL) BY MOUTH 3 (THREE) TIMES DAILY AS NEEDED    . traMADol (ULTRAM) 50 MG tablet Take by mouth.     . zolpidem (AMBIEN) 5 MG tablet Take 1 tablet (5 mg total) by mouth at bedtime as needed for sleep. 30 tablet 1   No current facility-administered medications for this visit.     Allergies as of 11/19/2018 - Review Complete 11/19/2018  Allergen Reaction Noted  . Tape Other (See Comments) 08/25/2018    Review of Systems:    All systems reviewed and negative except where noted in HPI.   Observations/Objective:  Labs: CMP     Component Value Date/Time   NA 134 (L) 07/31/2018 1032   K 3.5 07/31/2018 1032   CL 95 (L) 07/31/2018 1032   CO2 29 07/31/2018 1032   GLUCOSE 94 07/31/2018 1032   BUN 16 07/31/2018 1032   CREATININE 0.78 07/31/2018 1032   CALCIUM 9.4 07/31/2018 1032   PROT 7.0 12/18/2017 0944   ALBUMIN 4.0 12/18/2017 0944   AST 30 12/18/2017 0944   ALT 30 12/18/2017 0944   ALKPHOS 73 12/18/2017 0944   BILITOT 0.9 12/18/2017 0944   GFRNONAA >60 07/31/2018 1032   GFRAA >60 07/31/2018 1032   Lab Results  Component Value Date   WBC 9.5 04/12/2018   HGB 11.6 (L) 04/12/2018   HCT 34.6 (L) 04/12/2018   MCV 84.4 04/12/2018   PLT 367 04/12/2018    Imaging Studies: No results found.  Assessment and Plan:   Kelli Logan is a 58 y.o. y/o female here for follow-up of GERD and constipation  Assessment and Plan: GERD resolved Continue omeprazole 20 mg once daily.  Patient states even if she misses 1 pill, she has severe symptoms and does not want to try coming off of it again like she did after our last clinic visit with her  (Risks of PPI use were discussed with patient including bone loss, C. Diff diarrhea, pneumonia, infections, CKD, electrolyte abnormalities.   Pt. Verbalizes understanding and chooses to continue the medication.)  She is on the lowest dose possible  Constipation resolved Continue high-fiber diet   Follow Up Instructions: 6 to 9 months   I discussed the assessment and treatment plan with the patient. The patient was provided an opportunity to  ask questions and all were answered. The patient agreed with the plan and demonstrated an understanding of the instructions.   The patient was advised to call back or seek an in-person evaluation if the symptoms worsen or if the condition fails to improve as anticipated.  I provided 15 minutes of face-to-face time via video software during this encounter. Additional time was spent in reviewing patient's chart, placing orders etc.   Virgel Manifold, MD  Speech recognition software was used to dictate this note.

## 2018-11-24 ENCOUNTER — Ambulatory Visit (INDEPENDENT_AMBULATORY_CARE_PROVIDER_SITE_OTHER): Payer: Medicare Other | Admitting: Licensed Clinical Social Worker

## 2018-11-24 ENCOUNTER — Encounter: Payer: Self-pay | Admitting: Licensed Clinical Social Worker

## 2018-11-24 ENCOUNTER — Other Ambulatory Visit: Payer: Self-pay

## 2018-11-24 DIAGNOSIS — F4310 Post-traumatic stress disorder, unspecified: Secondary | ICD-10-CM
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
## 2018-11-26 ENCOUNTER — Other Ambulatory Visit: Payer: Self-pay | Admitting: Orthopedic Surgery

## 2018-11-28 ENCOUNTER — Other Ambulatory Visit: Payer: Self-pay

## 2018-11-28 ENCOUNTER — Encounter
Admission: RE | Admit: 2018-11-28 | Discharge: 2018-11-28 | Disposition: A | Discharge status: Home / Self Care | Payer: Medicare Other | Source: Ambulatory Visit | Attending: Orthopedic Surgery | Admitting: Orthopedic Surgery

## 2018-11-28 DIAGNOSIS — Z01812 Encounter for preprocedural laboratory examination: Secondary | ICD-10-CM | POA: Insufficient documentation

## 2018-11-28 HISTORY — DX: Headache, unspecified: R51.9

## 2018-11-28 HISTORY — DX: Gastro-esophageal reflux disease without esophagitis: K21.9

## 2018-11-28 HISTORY — DX: Hypothyroidism, unspecified: E03.9

## 2018-11-28 NOTE — Patient Instructions (Signed)
Your procedure is scheduled on: Mon 12/7 Report to Day Surgery. To find out your arrival time please call [phone] between 1PM - 3PM on Friday.12/4  Remember: Instructions that are not followed completely may result in serious medical risk,  up to and including death, or upon the discretion of your surgeon and anesthesiologist your  surgery may need to be rescheduled.     _X__ 1. Do not eat food after midnight the night before your procedure.                 No gum chewing or hard candies. You may drink clear liquids up to 2 hours                 before you are scheduled to arrive for your surgery- DO not drink clear                 liquids within 2 hours of the start of your surgery.                 Clear Liquids include:  water, apple juice without pulp, clear carbohydrate                 drink such as Clearfast of Gatorade, Black Coffee or Tea (Do not add                 anything to coffee or tea).  __X__2.  On the morning of surgery brush your teeth with toothpaste and water, you                may rinse your mouth with mouthwash if you wish.  Do not swallow any toothpaste of mouthwash.     ___ 3.  No Alcohol for 24 hours before or after surgery.   ___ 4.  Do Not Smoke or use e-cigarettes For 24 Hours Prior to Your Surgery.                 Do not use any chewable tobacco products for at least 6 hours prior to                 surgery.  ____  5.  Bring all medications with you on the day of surgery if instructed.   __x__  6.  Notify your doctor if there is any change in your medical condition      (cold, fever, infections).     Do not wear jewelry, make-up, hairpins, clips or nail polish. Do not wear lotions, powders, or perfumes. You may wear deodorant. Do not shave 48 hours prior to surgery. Men may shave face and neck. Do not bring valuables to the hospital.    [facility] is not responsible for any belongings or valuables.  Contacts, dentures or  bridgework may not be worn into surgery. Leave your suitcase in the car. After surgery it may be brought to your room. For patients admitted to the hospital, discharge time is determined by your treatment team.   Patients discharged the day of surgery will not be allowed to drive home.   Please read over the following fact sheets that you were given:    __x__ Take these medicines the morning of surgery with A SIP OF WATER:    1. citalopram (CELEXA) 40 MG tablet  2. doxazosin (CARDURA) 1 MG tablet  3. levothyroxine   4.metoprolol (LOPRESSOR) 50 MG tablet  5.omeprazole (PRILOSEC) 20 MG capsule   Dose the night before and the  morning of surgery  6.  ____ Fleet Enema (as directed)   __x__ Use CHG Soap as directed  ____ Use inhalers on the day of surgery  ____ Stop metformin 2 days prior to surgery    ____ Take 1/2 of usual insulin dose the night before surgery. No insulin the morning          of surgery.   ____ Stop Coumadin/Plavix/aspirin on   __x__ Stop Anti-inflammatories naproxen (NAPROSYN) 500 MG tablet  Ibuprofen or aspirin   May take tylenol   __x__ Stop supplements until after surgery.  BIOTIN PO,Chromium Picolinate (CHROMIUM PICOLATE PO),Coenzyme Q10 (COQ10) 100 MG CAPS,Flaxseed, Linseed, (FLAXSEED OIL PO),GLUCOSAMINE-CHONDROITIN PO,Omega-3 Fatty Acids (FISH OIL PO)  ____ Bring C-Pap to the hospital.

## 2018-12-01 ENCOUNTER — Telehealth: Payer: Self-pay

## 2018-12-01 NOTE — Telephone Encounter (Signed)

## 2018-12-02 ENCOUNTER — Encounter: Payer: Self-pay | Admitting: Plastic Surgery

## 2018-12-02 ENCOUNTER — Ambulatory Visit (INDEPENDENT_AMBULATORY_CARE_PROVIDER_SITE_OTHER): Payer: Medicare Other | Admitting: Plastic Surgery

## 2018-12-02 ENCOUNTER — Other Ambulatory Visit: Payer: Self-pay

## 2018-12-02 DIAGNOSIS — Z9889 Other specified postprocedural states: Secondary | ICD-10-CM

## 2018-12-02 NOTE — Patient Instructions (Signed)
Arrival Time: Please call Labor and Delivery the day before your scheduled C-Section to find out your arrival time. [phone].  Arrival: Arrive to the [facility]. If your arrival time is prior to 6:00am, please call Labor and Delivery [phone] from your cell phone upon arrival and someone from L&D or security will come down to the [place] entrance and escort you to Labor and Delivery. If your arrival time is 6:00am or later, please enter the [facility] and follow the greeter's instructions.   Partner: ONE dedicated partner/guest is allowed to accompany you throughout your hospital stay. They are allowed to accompany you to the operating room. Your guest may leave and return during normal visiting hours, but will have to be re-screened with each entry to the [facility]. Partners are not allowed to switch out for a new guest at any point during your hospital stay.

## 2018-12-02 NOTE — Progress Notes (Signed)
   Subjective:    Patient ID: Kelli Logan, female    DOB: [birth year], 59 y.o.   MRN: [id number]  A 58 year old female here for follow-up on her breast reconstruction.  She had a left mastectomy that was nipple sparing.  She had immediate reconstruction with expander followed by an implant.  She had a right breast reduction mastopexy for symmetry.  Overall she is doing well.  The incisions are healing very nicely.  She has very good symmetry in a bra and good symmetry while standing.  She has a little bit of superior medial pole hollowing out on the left breast.  This is not uncommon.  She feels she is got a little bit of fullness on the right lateral breast she also thinks she has a little loss of volume on the left medial inferior portion.  Her biggest concern is that she does not like the distortion with animation of the pectoralis major muscle on the left.  She also wants to see if her nipples can be made more prominent.   Review of Systems  Constitutional: Negative.   Respiratory: Negative.   Cardiovascular: Negative.   Musculoskeletal: Negative.        Objective:   Physical Exam Vitals signs and nursing note reviewed.  Constitutional:      Appearance: Normal appearance.  HENT:     Head: Normocephalic and atraumatic.  Cardiovascular:     Rate and Rhythm: Normal rate.  Pulmonary:     Effort: Pulmonary effort is normal.  Neurological:     General: No focal deficit present.     Mental Status: She is alert and oriented to person, place, and time.  Psychiatric:        Mood and Affect: Mood normal.        Behavior: Behavior normal.        Thought Content: Thought content normal.        Assessment & Plan:     ICD-10-CM   1. S/P breast reconstruction, left  Z98.890    She is a candidate for fat grafting on the left and liposuction on the right.  I don't think the left breast animation can be changed.  I definitely suggest that she wait several months to let things settle in  before revision surgery. Pictures were obtained of the patient and placed in the chart with the patient's or guardian's permission.

## 2018-12-11 ENCOUNTER — Other Ambulatory Visit: Payer: Medicare Other

## 2018-12-12 ENCOUNTER — Inpatient Hospital Stay: Payer: Medicare Other | Admitting: Internal Medicine

## 2018-12-12 NOTE — Assessment & Plan Note (Deleted)
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  LOW risk-Oncotype.  Stable.  Currently off letrozole because of severe intolerance.  [severe fatigue/joint pains hot flashes/post coital pain]/.   #Long discussion the patient regarding the importance of antihormone therapy-in prevention of recurrence of her breast cancer.  With endocrine therapy-based on Oncotype-risk of recurrence is around 6%.  However without endocrine therapy there is good but definitely be high.  I encouraged the patient to think about trying alternative endocrine therapy like Aromasin.  Patient wants to wait for now.   #Left mastectomy; Right mastopexy-followed by plastic surgery.  # DISPOSITION:  # follow up in 1st week of dec 2020- MD; no labs--Dr.B

## 2018-12-12 NOTE — Progress Notes (Deleted)
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 s/p mastectomy [Dr.Cannon/Dillingham];   # Oncotype-RS- 20; Distant recurrence-risk of recurrence 6%.  No adjuvant chemo.  No radiation.  #May 09, 2018-letrozole [LMP- 2018]  # anxiety/depression  DIAGNOSIS: Right Breast ca  STAGE:  I; GOALS: cure  CURRENT/MOST RECENT THERAPY: Femara     Malignant neoplasm of lower-outer quadrant of left breast of female, estrogen receptor positive (HCC)     HISTORY OF PRESENTING ILLNESS:  DHirokoCoyte 58y.o.  female history of left breast cancer ER PR positive HER-2 negative stage I status post mastectomy is here for follow-up.  Patient stopped taking her letrozole after taking for about a month or so because of poor tolerance.  Patient complains of significant hot flashes/joint pains.  She also complains of dyspareunia; with vaginal atrophy attributed to letrozole.  Patient is currently being followed by surgery/plastics-for her reconstruction.  Review of Systems  Constitutional: Positive for malaise/fatigue. Negative for chills, diaphoresis, fever and weight loss.  HENT: Negative for nosebleeds and sore throat.   Eyes: Negative for double vision.  Respiratory: Negative for cough, hemoptysis, sputum production, shortness of breath and wheezing.   Cardiovascular: Negative for chest pain, palpitations, orthopnea and leg swelling.  Gastrointestinal: Negative for abdominal pain, blood in stool, constipation, diarrhea, heartburn, melena, nausea and vomiting.  Genitourinary: Negative for dysuria, frequency and urgency.  Musculoskeletal: Positive for back pain and joint pain.   Skin: Negative.  Negative for itching and rash.  Neurological: Negative for dizziness, tingling, focal weakness, weakness and headaches.  Endo/Heme/Allergies: Does not bruise/bleed easily.  Psychiatric/Behavioral: Negative for depression. The patient is nervous/anxious. The patient does not have insomnia.      MEDICAL HISTORY:  Past Medical History:  Diagnosis Date  . Anxiety   . Arthritis   . Cancer ([name]    breast left  . Depression   . Fibromyalgia   . GERD (gastroesophageal reflux disease)   . Headache   . History of kidney stones    h/o  . Hypertension   . Hypothyroidism   . Thyroid disease     SURGICAL HISTORY: Past Surgical History:  Procedure Laterality Date  . APPENDECTOMY    . BREAST BIOPSY Left few yrs ago   [place]reot[place]  . BREAST BIOPSY Left 12/26/2017   Affirm Bx IMC- X-Clip  . BREAST BIOPSY Left 01/14/2018   Affirm Bx- Coil clip- path pending  . BREAST RECONSTRUCTION WITH PLACEMENT OF TISSUE EXPANDER AND ALLODERM Left 04/08/2018   Procedure: LEFT BREAST IMMEDIATE  RECONSTRUCTION WITH EXPANDER AND FLEX HD;  Surgeon: DWallace Going DO;  Location: [facility] ORS;  Service: Plastics;  Laterality: Left;  . COLONOSCOPY WITH PROPOFOL N/A 06/26/2018   Procedure: COLONOSCOPY WITH PROPOFOL;  Surgeon: TVirgel Manifold MD;  Location: [facility] ENDOSCOPY;  Service: Endoscopy;  Laterality: N/A;  . ESOPHAGOGASTRODUODENOSCOPY (EGD) WITH PROPOFOL N/A 06/26/2018   Procedure: ESOPHAGOGASTRODUODENOSCOPY (EGD) WITH PROPOFOL;  Surgeon: TVirgel Manifold MD;  Location: [facility] ENDOSCOPY;  Service: Endoscopy;  Laterality: N/A;  . EYE SURGERY Bilateral    lasik  . FOOT BONE EXCISION    . IMAGE GUIDED SINUS SURGERY  10/220  . MASTECTOMY    .  MASTECTOMY W/ SENTINEL NODE BIOPSY Left 04/08/2018   Procedure: MASTECTOMY WITH SENTINEL LYMPH NODE BIOPSY LEFT;  Surgeon: Fredirick Maudlin, MD;  Location: [facility] ORS;  Service: General;  Laterality: Left;  Marland Kitchen MASTOPEXY Right  08/04/2018   Procedure: MASTOPEXY;  Surgeon: Wallace Going, DO;  Location: [facility] ORS;  Service: Plastics;  Laterality: Right;  TOTAL CASE TIME SHOULD BE 3 HOURS, PLEASE  . REMOVAL OF TISSUE EXPANDER AND PLACEMENT OF IMPLANT Left 08/04/2018   Procedure: REMOVAL OF TISSUE EXPANDER AND PLACEMENT OF IMPLANT;  Surgeon: Wallace Going, DO;  Location: [facility] ORS;  Service: Plastics;  Laterality: Left;  . UMBILICAL HERNIA REPAIR  1983 and 1985    SOCIAL HISTORY: Social History   Socioeconomic History  . Marital status: Divorced    Spouse name: Not on file  . Number of children: 1  . Years of education: Not on file  . Highest education level: High school graduate  Occupational History  . Not on file  Social Needs  . Financial resource strain: Very hard  . Food insecurity    Worry: Often true    Inability: Often true  . Transportation needs    Medical: Yes    Non-medical: Yes  Tobacco Use  . Smoking status: Former Smoker    Packs/day: 1.00    Years: 2.00    Pack years: 2.00    Types: Cigarettes    Quit date: 01/06/2017    Years since quitting: 1.9  . Smokeless tobacco: Never Used  Substance and Sexual Activity  . Alcohol use: Yes    Comment: rare beer   . Drug use: Not Currently  . Sexual activity: Not on file  Lifestyle  . Physical activity    Days per week: 0 days    Minutes per session: 0 min  . Stress: Very much  Relationships  . Social Herbalist on phone: Not on file    Gets together: Not on file    Attends religious service: Never    Active member of club or organization: No    Attends meetings of clubs or organizations: Never    Relationship status: Divorced  . Intimate partner violence    Fear of current or ex partner: No    Emotionally abused: No    Physically abused: No    Forced sexual activity: No  Other Topics Concern  . Not on file  Social History Narrative   Home on disability [pysche problems]; transportation issues; worked in  Landscape architect. 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 Quit smoking; ocassional alcohol.     FAMILY HISTORY: Family History  Problem Relation Age of Onset  . Hypertension Mother   . Lung cancer Maternal Grandmother   . Heart disease Father   . Alcohol abuse Father   . Breast cancer Neg Hx   . Colon cancer Neg Hx     ALLERGIES:  is allergic to tape.  MEDICATIONS:  Current Outpatient Medications  Medication Sig Dispense Refill  . atorvastatin (LIPITOR) 40 MG tablet Take 40 mg by mouth at bedtime.     Marland Kitchen BIOTIN PO Take 100 mcg by mouth daily.    . Calcium-Magnesium-Zinc (CAL-MAG-ZINC PO) Take 1 tablet by mouth daily in the afternoon.    . Chromium Picolinate (CHROMIUM PICOLATE PO) Take 1,000 mcg by mouth daily.    . citalopram (CELEXA) 40 MG tablet Take 1 tablet (40 mg total) by mouth every morning. 90 tablet 0  . Coenzyme Q10 (COQ10) 100 MG  CAPS Take 100 mg by mouth every evening.    Marland Kitchen doxazosin (CARDURA) 1 MG tablet Take 1 tablet (1 mg total) by mouth 2 (two) times daily. 180 tablet 3  . Flaxseed, Linseed, (FLAXSEED OIL PO) Take 1 capsule by mouth daily.    . fluticasone (FLONASE) 50 MCG/ACT nasal spray Place 2 sprays into both nostrils daily as needed for allergies.    Marland Kitchen gabapentin (NEURONTIN) 600 MG tablet Take 1 tablet (600 mg total) by mouth at bedtime. (Patient not taking: Reported on 11/26/2018) 90 tablet 0  . GLUCOSAMINE-CHONDROITIN PO Take 2 tablets by mouth daily. 1500-1200    . hydrochlorothiazide (HYDRODIURIL) 25 MG tablet Take 25 mg by mouth daily.     . hydrOXYzine (ATARAX/VISTARIL) 25 MG tablet TAKE 1 TABLET (25 MG TOTAL) BY MOUTH 3 (THREE) TIMES DAILY AS NEEDED FOR ANXIETY. 270 tablet 1  . levothyroxine (SYNTHROID, LEVOTHROID) 50 MCG tablet Take 50 mcg by mouth every other day.     . levothyroxine (SYNTHROID, LEVOTHROID) 75 MCG tablet Take 75 mcg by mouth every other day.    . losartan (COZAAR) 100 MG tablet Take 100 mg by mouth daily. for high blood pressure    .  metoprolol (LOPRESSOR) 50 MG tablet Take 1 tablet (50 mg total) by mouth 2 (two) times daily. 60 tablet 0  . Multiple Vitamin (MULTIVITAMIN WITH MINERALS) TABS tablet Take 1 tablet by mouth daily.    . naproxen (NAPROSYN) 500 MG tablet Take 500 mg by mouth 2 (two) times daily as needed for moderate pain.    . Omega-3 Fatty Acids (FISH OIL PO) Take 1 capsule by mouth daily.    Marland Kitchen omeprazole (PRILOSEC) 20 MG capsule Take 20 mg by mouth daily.     . Probiotic Product (PROBIOTIC DAILY PO) Take 2 capsules by mouth daily.    . QUEtiapine (SEROQUEL) 50 MG tablet Take 3 tablets (150 mg total) by mouth at bedtime. PRESCRIBED BY NEUROLOGY (Patient taking differently: Take 50 mg by mouth at bedtime. PRESCRIBED BY NEUROLOGY) 90 tablet 0  . zolpidem (AMBIEN) 5 MG tablet Take 1 tablet (5 mg total) by mouth at bedtime as needed for sleep. (Patient taking differently: Take 5 mg by mouth at bedtime as needed for sleep. sleep) 30 tablet 1   No current facility-administered medications for this visit.       Marland Kitchen  PHYSICAL EXAMINATION: ECOG PERFORMANCE STATUS: 0 - Asymptomatic  There were no vitals filed for this visit. There were no vitals filed for this visit.  Physical Exam  Constitutional: She is oriented to person, place, and time and well-developed, well-nourished, and in no distress.  HENT:  Head: Normocephalic and atraumatic.  Mouth/Throat: Oropharynx is clear and moist. No oropharyngeal exudate.  Eyes: Pupils are equal, round, and reactive to light.  Neck: Normal range of motion. Neck supple.  Cardiovascular: Normal rate and regular rhythm.  Pulmonary/Chest: No respiratory distress. She has no wheezes.  Abdominal: Soft. Bowel sounds are normal. She exhibits no distension and no mass. There is no abdominal tenderness. There is no rebound and no guarding.  Musculoskeletal: Normal range of motion.        General: No tenderness or edema.  Neurological: She is alert and oriented to person, place, and  time.  Skin: Skin is warm.  Psychiatric: Affect normal.     LABORATORY DATA:  I have reviewed the data as listed Lab Results  Component Value Date   WBC 9.5 04/12/2018   HGB 11.6 (L) 04/12/2018  HCT 34.6 (L) 04/12/2018   MCV 84.4 04/12/2018   PLT 367 04/12/2018   Recent Labs    12/18/17 0944  04/09/18 0427 04/12/18 2236 07/31/18 1032  NA 134*  --  131* 131* 134*  K 4.1   < > 4.2 3.7 3.5  CL 102  --  98 99 95*  CO2 24  --  _0 GLUCOSE 97  --  146* 136* 94  BUN 12  --  _1 CREATININE 0.79   < > 0.94 0.80 0.78  CALCIUM 9.1  --  9.1 9.1 9.4  GFRNONAA >60  --  >60 >60 >60  GFRAA >60  --  >60 >60 >60  PROT 7.0  --   --   --   --   ALBUMIN 4.0  --   --   --   --   AST 30  --   --   --   --   ALT 30  --   --   --   --   ALKPHOS 73  --   --   --   --   BILITOT 0.9  --   --   --   --    < > = values in this interval not displayed.    RADIOGRAPHIC STUDIES: I have personally reviewed the radiological images as listed and agreed with the findings in the report. No results found.  ASSESSMENT & PLAN:   No problem-specific Assessment & Plan notes found for this encounter.   All questions were answered. The patient knows to call the clinic with any problems, questions or concerns.    Cammie Sickle, MD 12/12/2018 8:21 AM

## 2018-12-15 ENCOUNTER — Ambulatory Visit: Admit: 2018-12-15 | Payer: Medicare Other | Source: Ambulatory Visit | Admitting: Orthopedic Surgery

## 2018-12-15 ENCOUNTER — Encounter: Payer: Self-pay | Source: Ambulatory Visit

## 2018-12-15 SURGERY — RELEASE, BURSA, TROCHANTERIC
Anesthesia: Choice | Site: Hip | Laterality: Left

## 2018-12-19 ENCOUNTER — Telehealth: Payer: Self-pay | Admitting: Psychiatry

## 2018-12-19 NOTE — Telephone Encounter (Signed)
I have reviewed lab report received from [facility] for patient.  CMP-glucose elevated at 192, potassium low at 3.4, chloride low at 92, calcium high at 10.4, albumin high at 5, ALT elevated at 51.  Lipid panel-within normal limits  Vitamin D-within normal limits  TSH-within normal limits at 0.859.

## 2018-12-22 ENCOUNTER — Inpatient Hospital Stay: Payer: Medicare Other | Attending: Internal Medicine | Admitting: Internal Medicine

## 2018-12-22 DIAGNOSIS — Z170 Estrogen receptor positive status [ER+]: Secondary | ICD-10-CM

## 2018-12-22 DIAGNOSIS — C50512 Malignant neoplasm of lower-outer quadrant of left female breast: Secondary | ICD-10-CM | POA: Diagnosis not present

## 2018-12-22 NOTE — Assessment & Plan Note (Signed)
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  LOW risk-Oncotype.  Currently off letrozole- because of severe intolerance.  [severe fatigue/joint pains hot flashes/post coital pain]. STABLE.  # continue surveillance without anti-estrogen therapy.   # Left mastectomy; Right mastopexy-followed by plastic surgery.  # Left upper extremity- tingling/numbess- monitor for now.    # DISPOSITION:  # follow up in 3 month- MD; no labs--Dr.B

## 2018-12-22 NOTE — Progress Notes (Signed)
I connected with Kelli Logan on 12/22/18 at  2:30 PM EST by video enabled telemedicine visit and verified that I am speaking with the correct person using two identifiers.  I discussed the limitations, risks, security and privacy concerns of performing an evaluation and management service by telemedicine and the availability of in-person appointments. I also discussed with the patient that there may be a patient responsible charge related to this service. The patient expressed understanding and agreed to proceed.    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 s/p mastectomy [Dr.Cannon/Dillingham];   # Oncotype-RS- 20; Distant recurrence-risk of recurrence 6%.  No adjuvant chemo.  No radiation.  #May 09, 2018-letrozole [LMP- 2018]  # anxiety/depression  DIAGNOSIS: Right Breast ca  STAGE:  I; GOALS: cure  CURRENT/MOST RECENT THERAPY: Femara     Malignant neoplasm of lower-outer quadrant of left breast of female, estrogen receptor positive ([facility]     Chief Complaint: Left breast cancer  History of present illness:Kelli Logan 58y.o.  female with history of left breast cancer stage I ER/PR positive HER-2 negative-currently not on antihormone therapy because of poor tolerance.  Patient is quite stressed given her mother's recent diagnosis of rectal cancer.  Patient was also diagnosed with Covid/pneumonia and [place] in [place]  Patient is under significant stress given inability to visit her.  Patient also postpone her orthopedic surgery.  She complains of chronic intermittent tingling and numbness in the left upper extremity.   Observation/objective:  Assessment and  plan: Malignant neoplasm of lower-outer quadrant of left breast of female, estrogen receptor positive ([facility] # Stage I left breast IMC; pT1b [6 mm] ER-Pos; PR-neg; Her 2 neu status post mastectomy.  LOW risk-Oncotype.  Currently off letrozole- because of severe intolerance.  [severe fatigue/joint pains hot flashes/post coital pain]. STABLE.  # continue surveillance without anti-estrogen therapy.   # Left mastectomy; Right mastopexy-followed by plastic surgery.  # Left upper extremity- tingling/numbess- monitor for now.    # DISPOSITION:  # follow up in 3 month- MD; no labs--Dr.B  Follow-up instructions:  I discussed the assessment and treatment plan with the patient.  The patient was provided an opportunity to ask questions and all were answered.  The patient agreed with the plan and demonstrated understanding of instructions.  The patient was advised to call back or seek an in person evaluation if the symptoms worsen or if the condition fails to improve as anticipated.  I provided 11 minutes of non face-to-face telephone visit time during this encounter, and > 50% was spent counseling as documented under my assessment & plan.   Dr. GCharlaine DaltonCSeacliffat [facility]12/14/2020 2:53 PM

## 2018-12-23 ENCOUNTER — Encounter: Payer: Self-pay | Admitting: Psychiatry

## 2018-12-23 ENCOUNTER — Ambulatory Visit (INDEPENDENT_AMBULATORY_CARE_PROVIDER_SITE_OTHER): Payer: Medicare Other | Admitting: Psychiatry

## 2018-12-23 ENCOUNTER — Other Ambulatory Visit: Payer: Self-pay

## 2018-12-23 DIAGNOSIS — F424 Excoriation (skin-picking) disorder: Secondary | ICD-10-CM

## 2018-12-23 DIAGNOSIS — F4310 Post-traumatic stress disorder, unspecified: Secondary | ICD-10-CM | POA: Diagnosis not present

## 2018-12-23 DIAGNOSIS — F4010 Social phobia, unspecified: Secondary | ICD-10-CM

## 2018-12-23 DIAGNOSIS — F410 Panic disorder [episodic paroxysmal anxiety] without agoraphobia: Secondary | ICD-10-CM | POA: Diagnosis not present

## 2018-12-23 MED ORDER — GABAPENTIN 600 MG PO TABS: 600.0000 mg | ORAL_TABLET | ORAL | 0 refills | Status: DC

## 2018-12-23 MED ORDER — HYDROXYZINE PAMOATE 50 MG PO CAPS: 50.0000 mg | ORAL_CAPSULE | Freq: Two times a day (BID) | ORAL | 0 refills | Status: DC | PRN

## 2018-12-23 NOTE — Progress Notes (Signed)
Virtual Visit via Video Note  I connected with Kelli Logan on 12/23/18 at  2:30 PM EST by a video enabled telemedicine application and verified that I am speaking with the correct person using two identifiers.   I discussed the limitations of evaluation and management by telemedicine and the availability of in person appointments. The patient expressed understanding and agreed to proceed.     I discussed the assessment and treatment plan with the patient. The patient was provided an opportunity to ask questions and all were answered. The patient agreed with the plan and demonstrated an understanding of the instructions.   The patient was advised to call back or seek an in-person evaluation if the symptoms worsen or if the condition fails to improve as anticipated.   [name]  12/23/2018 3:09 PM Hero Prem  MRN:  [id number]  Chief Complaint:  Chief Complaint    Follow-up     HPI: Kelli Logan is a 58 year old Caucasian female, divorced, lives in [place], has a history of PTSD, skin picking disorder, panic attacks, breast cancer, kidney disease, thyroid disease was evaluated by telemedicine today.  A video call was initiated however due to connection problem it had to be changed to a phone call.  Patient today reports she is currently struggling with several psychosocial stressors.  She reports she got a call that her mother is currently hospitalized with cancer, kidney failure as well as is in critical stage.  She is currently admitted to the hospital in [place].  Patient reports she is struggling to get to her mother however does not know how to do it.  She also does not have financial support to do that at this time.  It does make her anxious.  She does not know what to do and does not think she can do it all by herself.  Patient reports she is currently anxious, nervous, feeling on edge.  She has had anxiety attacks recently.  Patient denies any suicidality, homicidality or  perceptual disturbances.  Patient continues to struggle with pain which also affects her mood.  Patient agrees to contact her psychotherapist to make a sooner appointment.  Patient denies any other concerns today. Visit Diagnosis:    ICD-10-CM   1. PTSD (post-traumatic stress disorder)  F43.10   2. Skin-picking disorder  F42.4 hydrOXYzine (VISTARIL) 50 MG capsule  3. Panic disorder  F41.0 gabapentin (NEURONTIN) 600 MG tablet    hydrOXYzine (VISTARIL) 50 MG capsule  4. Social anxiety disorder  F40.10     Past Psychiatric History: I have reviewed past psychiatric history from my progress note on 03/10/2018.  Past trials of Paxil, Prozac, Zoloft, Lamictal.  Past Medical History:  Past Medical History:  Diagnosis Date  . Anxiety   . Arthritis   . Cancer [facility])    breast left  . Depression   . Fibromyalgia   . GERD (gastroesophageal reflux disease)   . Headache   . History of kidney stones    h/o  . Hypertension   . Hypothyroidism   . Thyroid disease     Past Surgical History:  Procedure Laterality Date  . APPENDECTOMY    . BREAST BIOPSY Left few yrs ago   stereotactic bx in [place], benign  . BREAST BIOPSY Left 12/26/2017   Affirm Bx IMC- X-Clip  . BREAST BIOPSY Left 01/14/2018   Affirm Bx- Coil clip- path pending  . BREAST RECONSTRUCTION WITH PLACEMENT OF TISSUE EXPANDER AND ALLODERM Left 04/08/2018   Procedure: LEFT  BREAST IMMEDIATE  RECONSTRUCTION WITH EXPANDER AND FLEX HD;  Surgeon: Wallace Going, DO;  Location: [facility] ORS;  Service: Plastics;  Laterality: Left;  . COLONOSCOPY WITH PROPOFOL N/A 06/26/2018   Procedure: COLONOSCOPY WITH PROPOFOL;  Surgeon: Virgel Manifold, MD;  Location: [facility] ENDOSCOPY;  Service: Endoscopy;  Laterality: N/A;  . ESOPHAGOGASTRODUODENOSCOPY (EGD) WITH PROPOFOL N/A 06/26/2018   Procedure: ESOPHAGOGASTRODUODENOSCOPY (EGD) WITH PROPOFOL;  Surgeon: Virgel Manifold, MD;  Location: [facility] ENDOSCOPY;  Service: Endoscopy;  Laterality: N/A;  .  EYE SURGERY Bilateral    lasik  . FOOT BONE EXCISION    . IMAGE GUIDED SINUS SURGERY  10/220  . MASTECTOMY    . MASTECTOMY W/ SENTINEL NODE BIOPSY Left 04/08/2018   Procedure: MASTECTOMY WITH SENTINEL LYMPH NODE BIOPSY LEFT;  Surgeon: Fredirick Maudlin, MD;  Location: [facility] ORS;  Service: General;  Laterality: Left;  Marland Kitchen MASTOPEXY Right 08/04/2018   Procedure: MASTOPEXY;  Surgeon: Wallace Going, DO;  Location: [facility] ORS;  Service: Plastics;  Laterality: Right;  TOTAL CASE TIME SHOULD BE 3 HOURS, PLEASE  . REMOVAL OF TISSUE EXPANDER AND PLACEMENT OF IMPLANT Left 08/04/2018   Procedure: REMOVAL OF TISSUE EXPANDER AND PLACEMENT OF IMPLANT;  Surgeon: Wallace Going, DO;  Location: [facility] ORS;  Service: Plastics;  Laterality: Left;  . UMBILICAL HERNIA REPAIR  1983 and 1985    Family Psychiatric History: I have reviewed family psychiatric history from my progress note on 03/10/2018.  Family History:  Family History  Problem Relation Age of Onset  . Hypertension Mother   . Lung cancer Maternal Grandmother   . Heart disease Father   . Alcohol abuse Father   . Breast cancer Neg Hx   . Colon cancer Neg Hx     Social History: Reviewed social history from my progress note on 03/10/2018. Social History   Socioeconomic History  . Marital status: Divorced    Spouse name: Not on file  . Number of children: 1  . Years of education: Not on file  . Highest education level: High school graduate  Occupational History  . Not on file  Tobacco Use  . Smoking status: Former Smoker    Packs/day: 1.00    Years: 2.00    Pack years: 2.00    Types: Cigarettes    Quit date: 01/06/2017    Years since quitting: 1.9  . Smokeless tobacco: Never Used  Substance and Sexual Activity  . Alcohol use: Yes    Comment: rare beer   . Drug use: Not Currently  . Sexual activity: Not on file  Other Topics Concern  . Not on file  Social History Narrative   Home on disability [pysche problems]; transportation  issues; worked in Landscape [employer]. 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 Quit smoking; ocassional alcohol.    Social Determinants of Health   Financial Resource Strain: High Risk  . Difficulty of Paying Living Expenses: Very hard  Food Insecurity: Food Insecurity Present  . Worried About Charity fundraiser in the Last Year: Often true  . Ran Out of Food in the Last Year: Often true  Transportation Needs: Unmet Transportation Needs  . Lack of Transportation (Medical): Yes  . Lack of Transportation (Non-Medical): Yes  Physical Activity: Inactive  . Days of Exercise per Week: 0 days  . Minutes of Exercise per Session: 0 min  Stress: Stress Concern Present  . Feeling of Stress : Very much  Social Connections: Unknown  . Frequency of Communication with Friends and Family:  Not on file  . Frequency of Social Gatherings with Friends and Family: Not on file  . Attends Religious Services: Never  . Active Member of Clubs or Organizations: No  . Attends Archivist Meetings: Never  . Marital Status: Divorced    Allergies:  Allergies  Allergen Reactions  . Tape Other (See Comments)    Paper tape after breast surgery/Burned skin   Tegaderm and other tape OK    Metabolic Disorder Labs: No results found for: HGBA1C, MPG No results found for: PROLACTIN No results found for: CHOL, TRIG, HDL, CHOLHDL, VLDL, LDLCALC No results found for: TSH  Therapeutic Level Labs: No results found for: LITHIUM No results found for: VALPROATE No components found for:  CBMZ  Current Medications: Current Outpatient Medications  Medication Sig Dispense Refill  . cyclobenzaprine (FLEXERIL) 10 MG tablet 1/2-1 po bid prn    . diclofenac (VOLTAREN) 75 MG EC tablet twice daily with food for 1 month, then decrease to 75 mg once daily for 1 month, then discontinue. No over the counter pain relievers except Tylenol take with food    . atorvastatin (LIPITOR) 40 MG tablet Take 40 mg by mouth at bedtime.      Marland Kitchen BIOTIN PO Take 100 mcg by mouth daily.    . Calcium-Magnesium-Zinc (CAL-MAG-ZINC PO) Take 1 tablet by mouth daily in the afternoon.    . Chromium Picolinate (CHROMIUM PICOLATE PO) Take 1,000 mcg by mouth daily.    . citalopram (CELEXA) 40 MG tablet Take 1 tablet (40 mg total) by mouth every morning. 90 tablet 0  . Coenzyme Q10 (COQ10) 100 MG CAPS Take 100 mg by mouth every evening.    Marland Kitchen doxazosin (CARDURA) 1 MG tablet Take 1 tablet (1 mg total) by mouth 2 (two) times daily. 180 tablet 3  . Flaxseed, Linseed, (FLAXSEED OIL PO) Take 1 capsule by mouth daily.    . fluticasone (FLONASE) 50 MCG/ACT nasal spray Place 2 sprays into both nostrils daily as needed for allergies.    Marland Kitchen gabapentin (NEURONTIN) 600 MG tablet Take 1 tablet (600 mg total) by mouth as directed. TAKE HALF TABLET IN THE AM AND HALF TABLET AT LUNCH AND 1 TABLET( 600 MG ) AT BEDTIME 180 tablet 0  . GLUCOSAMINE-CHONDROITIN PO Take 2 tablets by mouth daily. 1500-1200    . hydrochlorothiazide (HYDRODIURIL) 25 MG tablet Take 25 mg by mouth daily.     . hydrOXYzine (VISTARIL) 50 MG capsule Take 1 capsule (50 mg total) by mouth 2 (two) times daily as needed. For anxiety attacks 180 capsule 0  . levothyroxine (SYNTHROID, LEVOTHROID) 50 MCG tablet Take 50 mcg by mouth every other day.     . levothyroxine (SYNTHROID, LEVOTHROID) 75 MCG tablet Take 75 mcg by mouth every other day.    . losartan (COZAAR) 100 MG tablet Take 100 mg by mouth daily. for high blood pressure    . metoprolol (LOPRESSOR) 50 MG tablet Take 1 tablet (50 mg total) by mouth 2 (two) times daily. 60 tablet 0  . Multiple Vitamin (MULTIVITAMIN WITH MINERALS) TABS tablet Take 1 tablet by mouth daily.    . naproxen (NAPROSYN) 500 MG tablet Take 500 mg by mouth 2 (two) times daily as needed for moderate pain.    . Omega-3 Fatty Acids (FISH OIL PO) Take 1 capsule by mouth daily.    Marland Kitchen omeprazole (PRILOSEC) 20 MG capsule Take 20 mg by mouth daily.     . Probiotic Product  (PROBIOTIC DAILY PO)  Take 2 capsules by mouth daily.    . QUEtiapine (SEROQUEL) 50 MG tablet Take 3 tablets (150 mg total) by mouth at bedtime. PRESCRIBED BY NEUROLOGY (Patient taking differently: Take 50 mg by mouth at bedtime. PRESCRIBED BY NEUROLOGY) 90 tablet 0  . zolpidem (AMBIEN) 5 MG tablet Take 1 tablet (5 mg total) by mouth at bedtime as needed for sleep. (Patient taking differently: Take 5 mg by mouth at bedtime as needed for sleep. sleep) 30 tablet 1   No current facility-administered medications for this visit.     Musculoskeletal: Strength & Muscle Tone: UTA Gait & Station: Reports as WNL Patient leans: N/A  Psychiatric Specialty Exam: Review of Systems  Musculoskeletal: Positive for back pain.  Psychiatric/Behavioral: The patient is nervous/anxious.   All other systems reviewed and are negative.   There were no vitals taken for this visit.There is no height or weight on file to calculate BMI.  General Appearance: UTA  Eye Contact:  UTA  Speech:  Clear and Coherent  Volume:  Normal  Mood:  Anxious and Dysphoric  Affect:  UTA  Thought Process:  Goal Directed and Descriptions of Associations: Intact  Orientation:  Full (Time, Place, and Person)  Thought Content: Logical   Suicidal Thoughts:  No  Homicidal Thoughts:  No  Memory:  Immediate;   Fair Recent;   Fair Remote;   Fair  Judgement:  Fair  Insight:  Fair  Psychomotor Activity:  UTA  Concentration:  Concentration: Fair and Attention Span: Fair  Recall:  [employer] of Knowledge: Fair  Language: Fair  Akathisia:  No  Handed:  Right  AIMS (if indicated): UTA  Assets:  Communication Skills Desire for Improvement Social Support  ADL's:  Intact  Cognition: WNL  Sleep:  Fair   Screenings:   Assessment and Plan: Kelli Logan is a 58 year old Caucasian female who is divorced, lives in [place], has a history of PTSD, skin picking disorder, panic attacks, social anxiety disorder, breast cancer status post  mastectomy and reconstruction surgery, chronic pain was evaluated by telemedicine today.  She is biologically predisposed given her family history as well as trauma and history of multiple health issues.  She also has the current psychosocial stressors of her mother who is admitted to the hospital in critical condition, financial problems.  Patient is currently struggling with mood symptoms and will benefit from medication readjustment.  Patient also encouraged to work with her therapist on a more frequent basis.  Plan as noted below.  Plan PTSD-some progress Celexa as prescribed Seroquel 150 mg p.o. nightly-as prescribed per neurology Ambien 5 mg p.o. nightly as needed.  Patient advised to limit use and uses it on a couple of times a week. Continue psychotherapy sessions.  Social anxiety disorder-improving Continue psychotherapy sessions.  Panic attacks-unstable Increase hydroxyzine to 50 mg p.o. twice daily as needed. Celexa as prescribed Increase gabapentin to 300 mg twice a day and 600 mg at bedtime. Patient advised to have more frequent psychotherapy sessions.  Skin picking disorder-unstable Continue CBT  Pending labs-TSH, lipid panel, hemoglobin A1c, prolactin, CBC, CMP.  Patient reported that she had her labs done at her primary care office and will fax it to [place].  Follow-up in clinic in 3 weeks or sooner if needed.  January 4 at 3:30 PM  I have spent atleast 15 minutes non face to face with patient today. More than 50 % of the time was spent for psychoeducation and supportive psychotherapy and care coordination. This note was  generated in part or whole with voice recognition software. Voice recognition is usually quite accurate but there are transcription errors that can and very often do occur. I apologize for any typographical errors that were not detected and corrected.        Ursula Alert, MD 12/23/2018, 3:09 PM

## 2018-12-25 ENCOUNTER — Encounter: Payer: Self-pay | Admitting: Licensed Clinical Social Worker

## 2018-12-25 ENCOUNTER — Other Ambulatory Visit: Payer: Self-pay

## 2018-12-25 ENCOUNTER — Ambulatory Visit (INDEPENDENT_AMBULATORY_CARE_PROVIDER_SITE_OTHER): Payer: Medicare Other | Admitting: Licensed Clinical Social Worker

## 2018-12-25 DIAGNOSIS — F4310 Post-traumatic stress disorder, unspecified: Secondary | ICD-10-CM | POA: Diagnosis not present

## 2018-12-25 NOTE — Progress Notes (Signed)
Virtual Visit via Video Note  I connected with Kelli Logan on 12/25/18 at 12:30 PM EST by a video enabled telemedicine application and verified that I am speaking with the correct person using two identifiers.   I discussed the limitations of evaluation and management by telemedicine and the availability of in person appointments. The patient expressed understanding and agreed to proceed.  I discussed the assessment and treatment plan with the patient. The patient was provided an opportunity to ask questions and all were answered. The patient agreed with the plan and demonstrated an understanding of the instructions.   The patient was advised to call back or seek an in-person evaluation if the symptoms worsen or if the condition fails to improve as anticipated.  I provided 53 minutes of non-face-to-face time during this encounter.   Alden Hipp, LCSW   THERAPIST PROGRESS NOTE  Session Time: 1230  Participation Level: Active  Behavioral Response: NeatAlertAnxious and Depressed  Type of Therapy: Individual Therapy  Treatment Goals addressed: Coping  Interventions: Strength-based and Supportive  Summary: Kelli Logan is a 58 y.o. female who presents with continued symptoms related to her diagnosis. Kelli Logan reports doing "horribly," since our last session. She reports she found out recently that her mother was taken to the hospital due to rectal bleeding, and then after arriving at the hospital found out she had cancer. She had heart problems as well, and while admitted to the hospital contracted COVID-19. She reports her mother died this week from [place], and she is beyond upset about it. LCSW validated Kelli Logan's feelings around this subject, and held space for her to talk about her mother and their relationship. Kelli Logan reports she and her mother were not very close, and had a lot of issues throughout Kelli Logan's life. She reports they spoke pretty regularly, but she has not seen her  since 2004. Kelli Logan is having a lot of anxiety about flying out to handle her mother's affairs, and doesn't really know where to start. LCSW validated all of [name]'s feelings, and encouraged her to take a deep breath and make a list of everything she needs to do. Then, prioritize those tasks and go from there. Kelli Logan expressed understanding and agreement. LCSW held space for the rest of the session for Kelli Logan to vent her frustrations and past experiences. We discussed ways to regulate her emotions while on this trip, and how she could best manage her anxiety and depression while flying.   Suicidal/Homicidal: No   Therapist Response: Kelli Logan continues to work towards her tx goals but has not yet reached them. We will continue to work on improving distress tolerance and emotional regulation skills.   Plan: Return again in 2 weeks.  Diagnosis: Axis I: Post Traumatic Stress Disorder    Axis II: No diagnosis    Alden Hipp, LCSW 12/25/2018

## 2019-01-12 ENCOUNTER — Ambulatory Visit: Payer: Medicare Other | Admitting: Psychiatry

## 2019-01-22 ENCOUNTER — Ambulatory Visit (INDEPENDENT_AMBULATORY_CARE_PROVIDER_SITE_OTHER): Payer: Medicare Other | Admitting: Licensed Clinical Social Worker

## 2019-01-22 ENCOUNTER — Other Ambulatory Visit: Payer: Self-pay

## 2019-01-22 ENCOUNTER — Encounter: Payer: Self-pay | Admitting: Licensed Clinical Social Worker

## 2019-01-22 DIAGNOSIS — F4310 Post-traumatic stress disorder, unspecified: Secondary | ICD-10-CM | POA: Diagnosis not present

## 2019-01-22 NOTE — Progress Notes (Signed)
Virtual Visit via Telephone Note  I connected with Vernicia Rockwood on 01/22/19 at 12:30 PM EST by telephone and verified that I am speaking with the correct person using two identifiers.   I discussed the limitations, risks, security and privacy concerns of performing an evaluation and management service by telephone and the availability of in person appointments. I also discussed with the patient that there may be a patient responsible charge related to this service. The patient expressed understanding and agreed to proceed.  I discussed the assessment and treatment plan with the patient. The patient was provided an opportunity to ask questions and all were answered. The patient agreed with the plan and demonstrated an understanding of the instructions.   The patient was advised to call back or seek an in-person evaluation if the symptoms worsen or if the condition fails to improve as anticipated.  I provided 53 minutes of non-face-to-face time during this encounter.   Alden Hipp, LCSW    THERAPIST PROGRESS NOTE  Session Time: 1230  Participation Level: Active  Behavioral Response: CasualAlertDepressed  Type of Therapy: Individual Therapy  Treatment Goals addressed: Coping  Interventions: Supportive  Summary: Eloyce Flegel is a 59 y.o. female who presents with continued symptoms related to her diagnosis.  Breslin reports not doing well since our last session. Satanya reports she has not been able to fly out [place] where her mother is, and has not been able to obtain a death certificate in order to begin taking care of her mother's affairs or planning the funeral. LCSW validated Laurette's feelings and held space for her to discuss thoughts around the situation. Kailaya went into discussing her childhood with her mother, and stated her mother was overly protective of her. She reports her mother, as an adult, did not want to talk to her often, and Kalandra felt like she made a lot of  excuses for not wanting to talk to her. LCSW encouraged Seng to write down how she's feeling about her mother, things she would like to tell her, etc, and see if that can bring any closure to her feelings on their relationship. Sheanna expressed understanding and agreement with this idea. Debroah went on to discuss her relationships with various men, and how she wants to start dating again but doesn't know how to due to [place]. We discussed ways to have socially distanced dates, and how Dorthie could utilize problems from past relationships to recognize what she does not want in future relationships. Eboni expressed agreement with this idea as well.   Suicidal/Homicidal: No  Therapist Response: Earlen continues to work towards her tx goals but has not yet reached them. We will continue to work on improving emotional regulation and distress tolerance moving forward.   Plan: Return again in 2 weeks.  Diagnosis: Axis I: Post Traumatic Stress Disorder    Axis II: No diagnosis    Alden Hipp, LCSW 01/22/2019

## 2019-01-27 ENCOUNTER — Encounter: Payer: Self-pay | Admitting: Psychiatry

## 2019-01-27 ENCOUNTER — Other Ambulatory Visit: Payer: Self-pay

## 2019-01-27 ENCOUNTER — Ambulatory Visit (INDEPENDENT_AMBULATORY_CARE_PROVIDER_SITE_OTHER): Payer: Medicare Other | Admitting: Psychiatry

## 2019-01-27 DIAGNOSIS — F410 Panic disorder [episodic paroxysmal anxiety] without agoraphobia: Secondary | ICD-10-CM | POA: Diagnosis not present

## 2019-01-27 DIAGNOSIS — F4310 Post-traumatic stress disorder, unspecified: Secondary | ICD-10-CM | POA: Diagnosis not present

## 2019-01-27 DIAGNOSIS — Z8659 Personal history of other mental and behavioral disorders: Secondary | ICD-10-CM

## 2019-01-27 DIAGNOSIS — F4010 Social phobia, unspecified: Secondary | ICD-10-CM

## 2019-01-27 DIAGNOSIS — F424 Excoriation (skin-picking) disorder: Secondary | ICD-10-CM | POA: Diagnosis not present

## 2019-01-27 MED ORDER — ZOLPIDEM TARTRATE 5 MG PO TABS: 5.0000 mg | ORAL_TABLET | Freq: Every evening | ORAL | 1 refills | Status: DC | PRN

## 2019-01-27 MED ORDER — CITALOPRAM HYDROBROMIDE 40 MG PO TABS: 40.0000 mg | ORAL_TABLET | ORAL | 0 refills | Status: DC

## 2019-01-27 MED ORDER — BUSPIRONE HCL 10 MG PO TABS: 10.0000 mg | ORAL_TABLET | Freq: Two times a day (BID) | ORAL | 1 refills | Status: DC

## 2019-01-27 NOTE — Progress Notes (Signed)
Virtual Visit via Video Note  I connected with Kelli Logan on 01/27/19 at  1:00 PM EST by a video enabled telemedicine application and verified that I am speaking with the correct person using two identifiers.   I discussed the limitations of evaluation and management by telemedicine and the availability of in person appointments. The patient expressed understanding and agreed to proceed.     I discussed the assessment and treatment plan with the patient. The patient was provided an opportunity to ask questions and all were answered. The patient agreed with the plan and demonstrated an understanding of the instructions.   The patient was advised to call back or seek an in-person evaluation if the symptoms worsen or if the condition fails to improve as anticipated.  [place] MD OP Progress Note  01/27/2019 5:15 PM Kelli Logan  MRN:  [id number]  Chief Complaint:  Chief Complaint    Follow-up     HPI: Kelli Logan is a 59 year old Caucasian female, divorced, lives in [place], has a history of PTSD, skin picking disorder, panic attacks, breast cancer, kidney disease was evaluated by telemedicine today.  Patient today reports she continues to struggle with psychosocial stressors.  She reports her mother passed away in Jan 15, 2023.  She reports she however has not been able to get to her mother who is currently in the hospital 30 hours away.  She reports she is trying to get there to conduct funeral arrangements for her mother.  She reports her mother who had several health problems got infected with COVID-19 after she was hospitalized.  She reports she is very angry about the fact that she got infected while she was hospitalized.  Patient reports she has continues to struggle with anxiety, nervousness as well as depressive symptoms like sadness.  She also reports she has been picking her skin lately due to her increased stressors.  She reports her blood pressure could also be elevated due to her  stressors and her anxiety.  Patient reports she does have upcoming appointment with [name] and reports therapy sessions is beneficial.  Patient denies any suicidality, homicidality or perceptual disturbances. Visit Diagnosis:    ICD-10-CM   1. PTSD (post-traumatic stress disorder)  F43.10 busPIRone (BUSPAR) 10 MG tablet    zolpidem (AMBIEN) 5 MG tablet    citalopram (CELEXA) 40 MG tablet  2. Skin-picking disorder  F42.4 busPIRone (BUSPAR) 10 MG tablet    citalopram (CELEXA) 40 MG tablet  3. Panic disorder  F41.0 busPIRone (BUSPAR) 10 MG tablet    citalopram (CELEXA) 40 MG tablet  4. Social anxiety disorder  F40.10 busPIRone (BUSPAR) 10 MG tablet    citalopram (CELEXA) 40 MG tablet  5. H/O borderline personality disorder  Z86.59     Past Psychiatric History: I have reviewed past psychiatric history from my progress note on 03/10/2018.  Past trials of Paxil, Prozac, Zoloft, Lamictal.  Past Medical History:  Past Medical History:  Diagnosis Date  . Anxiety   . Arthritis   . Cancer [facility])    breast left  . Depression   . Fibromyalgia   . GERD (gastroesophageal reflux disease)   . Headache   . History of kidney stones    h/o  . Hypertension   . Hypothyroidism   . Thyroid disease     Past Surgical History:  Procedure Laterality Date  . APPENDECTOMY    . BREAST BIOPSY Left few yrs ago   stereotactic bx in [place], benign  . BREAST BIOPSY Left 12/26/2017  Affirm Bx [facility]- X-Clip  . BREAST BIOPSY Left 01/14/2018   Affirm Bx- Coil clip- path pending  . BREAST RECONSTRUCTION WITH PLACEMENT OF TISSUE EXPANDER AND ALLODERM Left 04/08/2018   Procedure: LEFT BREAST IMMEDIATE  RECONSTRUCTION WITH EXPANDER AND FLEX HD;  Surgeon: Wallace Going, DO;  Location: [facility] ORS;  Service: Plastics;  Laterality: Left;  . COLONOSCOPY WITH PROPOFOL N/A 06/26/2018   Procedure: COLONOSCOPY WITH PROPOFOL;  Surgeon: Virgel Manifold, MD;  Location: [facility] ENDOSCOPY;  Service: Endoscopy;   Laterality: N/A;  . ESOPHAGOGASTRODUODENOSCOPY (EGD) WITH PROPOFOL N/A 06/26/2018   Procedure: ESOPHAGOGASTRODUODENOSCOPY (EGD) WITH PROPOFOL;  Surgeon: Virgel Manifold, MD;  Location: [facility] ENDOSCOPY;  Service: Endoscopy;  Laterality: N/A;  . EYE SURGERY Bilateral    lasik  . FOOT BONE EXCISION    . IMAGE GUIDED SINUS SURGERY  10/220  . MASTECTOMY    . MASTECTOMY W/ SENTINEL NODE BIOPSY Left 04/08/2018   Procedure: MASTECTOMY WITH SENTINEL LYMPH NODE BIOPSY LEFT;  Surgeon: Fredirick Maudlin, MD;  Location: [facility] ORS;  Service: General;  Laterality: Left;  Marland Kitchen MASTOPEXY Right 08/04/2018   Procedure: MASTOPEXY;  Surgeon: Wallace Going, DO;  Location: [facility] ORS;  Service: Plastics;  Laterality: Right;  TOTAL CASE TIME SHOULD BE 3 HOURS, PLEASE  . REMOVAL OF TISSUE EXPANDER AND PLACEMENT OF IMPLANT Left 08/04/2018   Procedure: REMOVAL OF TISSUE EXPANDER AND PLACEMENT OF IMPLANT;  Surgeon: Wallace Going, DO;  Location: [facility] ORS;  Service: Plastics;  Laterality: Left;  . UMBILICAL HERNIA REPAIR  1983 and 1985    Family Psychiatric History: Reviewed family psychiatric history from my progress note on 03/10/2018.  Family History:  Family History  Problem Relation Age of Onset  . Hypertension Mother   . Lung cancer Maternal Grandmother   . Heart disease Father   . Alcohol abuse Father   . Breast cancer Neg Hx   . Colon cancer Neg Hx     Social History: Reviewed social history from my progress note on 03/10/2018. Social History   Socioeconomic History  . Marital status: Divorced    Spouse name: Not on file  . Number of children: 1  . Years of education: Not on file  . Highest education level: High school graduate  Occupational History  . Not on file  Tobacco Use  . Smoking status: Former Smoker    Packs/day: 1.00    Years: 2.00    Pack years: 2.00    Types: Cigarettes    Quit date: 01/06/2017    Years since quitting: 2.0  . Smokeless tobacco: Never Used  Substance and  Sexual Activity  . Alcohol use: Yes    Comment: rare beer   . Drug use: Not Currently  . Sexual activity: Not on file  Other Topics Concern  . 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 Quit smoking; ocassional alcohol.    Social Determinants of Health   Financial Resource Strain: High Risk  . Difficulty of Paying Living Expenses: Very hard  Food Insecurity: Food Insecurity Present  . Worried About Charity fundraiser in the Last Year: Often true  . Ran Out of Food in the Last Year: Often true  Transportation Needs: Unmet Transportation Needs  . Lack of Transportation (Medical): Yes  . Lack of Transportation (Non-Medical): Yes  Physical Activity: Inactive  . Days of Exercise per Week: 0 days  .  Minutes of Exercise per Session: 0 min  Stress: Stress Concern Present  . Feeling of Stress : Very much  Social Connections: Unknown  . Frequency of Communication with Friends and Family: Not on file  . Frequency of Social Gatherings with Friends and Family: Not on file  . Attends Religious Services: Never  . Active Member of Clubs or Organizations: No  . Attends Archivist Meetings: Never  . Marital Status: Divorced    Allergies:  Allergies  Allergen Reactions  . Tape Other (See Comments)    Paper tape after breast surgery/Burned skin   Tegaderm and other tape OK    Metabolic Disorder Labs: No results found for: HGBA1C, MPG No results found for: PROLACTIN No results found for: CHOL, TRIG, HDL, CHOLHDL, VLDL, LDLCALC No results found for: TSH  Therapeutic Level Labs: No results found for: LITHIUM No results found for: VALPROATE No components found for:  CBMZ  Current Medications: Current Outpatient Medications  Medication Sig Dispense Refill  . atorvastatin (LIPITOR) 40 MG tablet Take 40 mg by mouth at bedtime.     Marland Kitchen BIOTIN PO Take 100 mcg  by mouth daily.    . busPIRone (BUSPAR) 10 MG tablet Take 1 tablet (10 mg total) by mouth 2 (two) times daily. 60 tablet 1  . Calcium-Magnesium-Zinc (CAL-MAG-ZINC PO) Take 1 tablet by mouth daily in the afternoon.    . Chromium Picolinate (CHROMIUM PICOLATE PO) Take 1,000 mcg by mouth daily.    . citalopram (CELEXA) 40 MG tablet Take 1 tablet (40 mg total) by mouth every morning. 90 tablet 0  . Coenzyme Q10 (COQ10) 100 MG CAPS Take 100 mg by mouth every evening.    . cyclobenzaprine (FLEXERIL) 10 MG tablet 1/2-1 po bid prn    . diclofenac (VOLTAREN) 75 MG EC tablet twice daily with food for 1 month, then decrease to 75 mg once daily for 1 month, then discontinue. No over the counter pain relievers except Tylenol take with food    . doxazosin (CARDURA) 1 MG tablet Take 1 tablet (1 mg total) by mouth 2 (two) times daily. 180 tablet 3  . Flaxseed, Linseed, (FLAXSEED OIL PO) Take 1 capsule by mouth daily.    . fluticasone (FLONASE) 50 MCG/ACT nasal spray Place 2 sprays into both nostrils daily as needed for allergies.    Marland Kitchen gabapentin (NEURONTIN) 600 MG tablet Take 1 tablet (600 mg total) by mouth as directed. TAKE HALF TABLET IN THE AM AND HALF TABLET AT LUNCH AND 1 TABLET( 600 MG ) AT BEDTIME 180 tablet 0  . GLUCOSAMINE-CHONDROITIN PO Take 2 tablets by mouth daily. 1500-1200    . hydrochlorothiazide (HYDRODIURIL) 25 MG tablet Take 25 mg by mouth daily.     . hydrOXYzine (VISTARIL) 50 MG capsule Take 1 capsule (50 mg total) by mouth 2 (two) times daily as needed. For anxiety attacks 180 capsule 0  . levothyroxine (SYNTHROID, LEVOTHROID) 50 MCG tablet Take 50 mcg by mouth every other day.     . levothyroxine (SYNTHROID, LEVOTHROID) 75 MCG tablet Take 75 mcg by mouth every other day.    . losartan (COZAAR) 100 MG tablet Take 100 mg by mouth daily. for high blood pressure    . metoprolol (LOPRESSOR) 50 MG tablet Take 1 tablet (50 mg total) by mouth 2 (two) times daily. 60 tablet 0  . Multiple Vitamin  (MULTIVITAMIN WITH MINERALS) TABS tablet Take 1 tablet by mouth daily.    . naproxen (NAPROSYN) 500 MG tablet  Take 500 mg by mouth 2 (two) times daily as needed for moderate pain.    . Omega-3 Fatty Acids (FISH OIL PO) Take 1 capsule by mouth daily.    Marland Kitchen omeprazole (PRILOSEC) 20 MG capsule Take 20 mg by mouth daily.     . Probiotic Product (PROBIOTIC DAILY PO) Take 2 capsules by mouth daily.    . QUEtiapine (SEROQUEL) 50 MG tablet Take 3 tablets (150 mg total) by mouth at bedtime. PRESCRIBED BY NEUROLOGY (Patient taking differently: Take 50 mg by mouth at bedtime. PRESCRIBED BY NEUROLOGY) 90 tablet 0  . zolpidem (AMBIEN) 5 MG tablet Take 1 tablet (5 mg total) by mouth at bedtime as needed for sleep. 30 tablet 1   No current facility-administered medications for this visit.     Musculoskeletal: Strength & Muscle Tone: UTA Gait & Station: uses a scooter Patient leans: N/A  Psychiatric Specialty Exam: Review of Systems  Psychiatric/Behavioral: Positive for dysphoric mood and sleep disturbance. The patient is nervous/anxious.   All other systems reviewed and are negative.   There were no vitals taken for this visit.There is no height or weight on file to calculate BMI.  General Appearance: Casual  Eye Contact:  Fair  Speech:  Clear and Coherent  Volume:  Normal  Mood:  Anxious and Depressed  Affect:  Congruent  Thought Process:  Goal Directed and Descriptions of Associations: Intact  Orientation:  Full (Time, Place, and Person)  Thought Content: Logical   Suicidal Thoughts:  No  Homicidal Thoughts:  No  Memory:  Immediate;   Fair Recent;   Fair Remote;   Fair  Judgement:  Fair  Insight:  Fair  Psychomotor Activity:  Normal  Concentration:  Concentration: Fair and Attention Span: Fair  Recall:  [employer] of Knowledge: Fair  Language: Fair  Akathisia:  No  Handed:  Right  AIMS (if indicated): denies tremors, rigidity  Assets:  Communication Skills Desire for  Improvement Housing  ADL's:  Intact  Cognition: WNL  Sleep:  Excessive   Screenings:   Assessment and Plan: Jonnie is a 59 year old Caucasian female who is divorced, lives in [place], has a history of PTSD, skin picking disorder, panic attacks, social anxiety disorder, breast cancer status post mastectomy, reconstruction surgery, chronic pain was evaluated by telemedicine today.  She is biologically predisposed given her family history, history of trauma and multiple health issues.  She also has current psychosocial stressors of the death of her mother.  Patient will continue to benefit from medication readjustment and psychotherapy session due to her worsening mood symptoms.  Plan as noted below.  Plan PTSD-some progress Celexa as prescribed Seroquel 150 mg p.o. nightly-prescribed per neurology Ambien 5 mg p.o. nightly as needed.  She has been limiting use. Continue CBT  Social anxiety disorder-improving Continue psychotherapy sessions  Panic attacks-unstable Gabapentin 300 mg p.o. twice daily and 600 mg at bedtime Add BuSpar 10 mg p.o. twice daily Hydroxyzine 50 mg p.o. twice daily as needed Celexa as prescribed  Skin picking disorder-unstable Add BuSpar 10 mg p.o. twice daily Continue CBT  Follow-up in clinic in 2 to 3 weeks or sooner if needed.  February 17 at 2:20 PM  I have spent atleast 20 minutes non face to face with patient today. More than 50 % of the time was spent for  ordering medications and test ,psychoeducation and supportive psychotherapy and care coordination,as well as documenting clinical information in electronic health record. This note was generated in part or whole  with voice recognition software. Voice recognition is usually quite accurate but there are transcription errors that can and very often do occur. I apologize for any typographical errors that were not detected and corrected.       Ursula Alert, MD 01/27/2019, 5:15 PM

## 2019-02-05 ENCOUNTER — Other Ambulatory Visit: Payer: Self-pay

## 2019-02-05 ENCOUNTER — Encounter: Payer: Self-pay | Admitting: Licensed Clinical Social Worker

## 2019-02-05 ENCOUNTER — Ambulatory Visit (INDEPENDENT_AMBULATORY_CARE_PROVIDER_SITE_OTHER): Payer: Medicare Other | Admitting: Licensed Clinical Social Worker

## 2019-02-05 DIAGNOSIS — F4310 Post-traumatic stress disorder, unspecified: Secondary | ICD-10-CM | POA: Diagnosis not present

## 2019-02-05 DIAGNOSIS — F424 Excoriation (skin-picking) disorder: Secondary | ICD-10-CM | POA: Diagnosis not present

## 2019-02-05 NOTE — Progress Notes (Signed)
Virtual Visit via Video Note  I connected with Kelli Logan on 02/05/19 at 12:30 PM EST by a video enabled telemedicine application and verified that I am speaking with the correct person using two identifiers.   I discussed the limitations of evaluation and management by telemedicine and the availability of in person appointments. The patient expressed understanding and agreed to proceed.  I discussed the assessment and treatment plan with the patient. The patient was provided an opportunity to ask questions and all were answered. The patient agreed with the plan and demonstrated an understanding of the instructions.   The patient was advised to call back or seek an in-person evaluation if the symptoms worsen or if the condition fails to improve as anticipated.  I provided 60 minutes of non-face-to-face time during this encounter.   Alden Hipp, LCSW    THERAPIST PROGRESS NOTE  Session Time: 1230  Participation Level: Active  Behavioral Response: CasualAlertAnxious  Type of Therapy: Individual Therapy  Treatment Goals addressed: Coping  Interventions: CBT  Summary: Kelli Logan is a 59 y.o. female who presents with continued symptoms related to her diagnosis. Alessi reports doing "okay,' since our last session. She notes she has still not been able to square things away with the loss of her mother, "my birth certificate doesn't have anyone listed as my parents, so I have to get that taken care of before I can even get her death certificate." LCSW validated Yuleidy's feelings of frustration and held space for her to discuss her feelings. LCSW encouraged Jencie to take things as they come, and to look at each individual task instead of the larger picture where this process is concerned. We walked through several examples on how she could best do this. Kyrra went on to discuss her skin picking and how that has been impacted by an increase in anxiety. LCSW encouraged Dahlya to  think about where she picks her skin the most, and attempt to keep fidget toys and distraction techniques handy in those areas. We also discussed utilizing sitting with the urge until it passes. Henriette expressed understanding and agreement.   Suicidal/Homicidal: No  Therapist Response: Theo continues to work towards her tx goals but has not yet reached them. We will continue to work on improving emotional regulation skills and distress tolerance moving forward. We will also continue to work on improving C BT skills to address anxiety/skin picking.   Plan: Return again in 2 weeks.  Diagnosis: Axis I: Post Traumatic Stress Disorder    Axis II: No diagnosis    Alden Hipp, LCSW 02/05/2019

## 2019-02-18 ENCOUNTER — Other Ambulatory Visit: Payer: Self-pay | Admitting: Psychiatry

## 2019-02-18 DIAGNOSIS — F424 Excoriation (skin-picking) disorder: Secondary | ICD-10-CM

## 2019-02-18 DIAGNOSIS — F4010 Social phobia, unspecified: Secondary | ICD-10-CM

## 2019-02-18 DIAGNOSIS — F4310 Post-traumatic stress disorder, unspecified: Secondary | ICD-10-CM

## 2019-02-18 DIAGNOSIS — F410 Panic disorder [episodic paroxysmal anxiety] without agoraphobia: Secondary | ICD-10-CM

## 2019-02-25 ENCOUNTER — Encounter: Payer: Self-pay | Admitting: Psychiatry

## 2019-02-25 ENCOUNTER — Ambulatory Visit (INDEPENDENT_AMBULATORY_CARE_PROVIDER_SITE_OTHER): Payer: Medicare Other | Admitting: Licensed Clinical Social Worker

## 2019-02-25 ENCOUNTER — Other Ambulatory Visit: Payer: Self-pay

## 2019-02-25 ENCOUNTER — Ambulatory Visit (INDEPENDENT_AMBULATORY_CARE_PROVIDER_SITE_OTHER): Payer: Medicare Other | Admitting: Psychiatry

## 2019-02-25 ENCOUNTER — Encounter: Payer: Self-pay | Admitting: Licensed Clinical Social Worker

## 2019-02-25 DIAGNOSIS — Z8659 Personal history of other mental and behavioral disorders: Secondary | ICD-10-CM

## 2019-02-25 DIAGNOSIS — F410 Panic disorder [episodic paroxysmal anxiety] without agoraphobia: Secondary | ICD-10-CM | POA: Diagnosis not present

## 2019-02-25 DIAGNOSIS — F424 Excoriation (skin-picking) disorder: Secondary | ICD-10-CM | POA: Diagnosis not present

## 2019-02-25 DIAGNOSIS — F4010 Social phobia, unspecified: Secondary | ICD-10-CM | POA: Diagnosis not present

## 2019-02-25 DIAGNOSIS — F4310 Post-traumatic stress disorder, unspecified: Secondary | ICD-10-CM

## 2019-02-25 MED ORDER — BUSPIRONE HCL 15 MG PO TABS: 15.0000 mg | ORAL_TABLET | Freq: Three times a day (TID) | ORAL | 1 refills | Status: DC

## 2019-02-25 NOTE — Progress Notes (Signed)
Virtual Visit via Video Note  I connected with Kelli Logan on 02/25/19 at 12:30 PM EST by a video enabled telemedicine application and verified that I am speaking with the correct person using two identifiers.   I discussed the limitations of evaluation and management by telemedicine and the availability of in person appointments. The patient expressed understanding and agreed to proceed.  I discussed the assessment and treatment plan with the patient. The patient was provided an opportunity to ask questions and all were answered. The patient agreed with the plan and demonstrated an understanding of the instructions.   The patient was advised to call back or seek an in-person evaluation if the symptoms worsen or if the condition fails to improve as anticipated.  I provided 60 minutes of non-face-to-face time during this encounter.   Kelli Hipp, LCSW    THERAPIST PROGRESS NOTE  Session Time: 1230  Participation Level: Active  Behavioral Response: NeatAlertAnxious  Type of Therapy: Individual Therapy  Treatment Goals addressed: Coping   Interventions: Supportive  Summary: Kelli Logan is a 59 y.o. female who presents with continued symptoms related to her diagnosis. Kelli Logan reports doing "okay," since our last session. She reports she was able to get her mother's death certificate which has helped facilitate getting the process going with arrangements and settling her mother's estate. LCSW validated Kelli Logan's feelings around the subject and held space for her to vent her frustrations about this process. Kelli Logan moved on to discussing her relationship with her friend, [name]. She reports she went to his home and they were intimate, but he did not treat her well after that. Kelli Logan reported this was the final straw for her, although she worries because he is supposed to go with her to [facility] to arrange things for her mother. LCSW validated Kelli Logan's feelings and encouraged her to  utilize assertive communication with [name] and to continue to set appropriate boundaries as needed. We discussed ways she could set boundaries, and brainstormed other ways she could get to [facility] without him. Kelli Logan expressed understanding and agreement with all information presented.   Suicidal/Homicidal: No  Therapist Response: Kelli Logan continues to work towards her tx goals but has not yet reached them. We will continue to work on improving distress tolerance and emotional regulation skills moving forward.   Plan: Return again in 3 weeks.  Diagnosis: Axis I: Post Traumatic Stress Disorder    Axis II: No diagnosis    Kelli Hipp, LCSW 02/25/2019

## 2019-02-25 NOTE — Progress Notes (Signed)
Provider Location : [facility] Patient Location : Home  Virtual Visit via Video Note  I connected with Kelli Logan on 02/25/19 at  2:20 PM EST by a video enabled telemedicine application and verified that I am speaking with the correct person using two identifiers.   I discussed the limitations of evaluation and management by telemedicine and the availability of in person appointments. The patient expressed understanding and agreed to proceed.   I discussed the assessment and treatment plan with the patient. The patient was provided an opportunity to ask questions and all were answered. The patient agreed with the plan and demonstrated an understanding of the instructions.   The patient was advised to call back or seek an in-person evaluation if the symptoms worsen or if the condition fails to improve as anticipated.  [facility] MD OP Progress Note  02/25/2019 4:19 PM Kelli Logan  MRN:  [id number]  Chief Complaint:  Chief Complaint    Follow-up     HPI: Kelli Logan is a 59 year old Caucasian female, divorced, lives in [place], has a history of PTSD, skin picking disorder, panic attacks, breast cancer, kidney disease was evaluated by telemedicine today.  Patient today reports she is currently anxious about her current psychosocial stressors.  She reports her mother passed away 2 months ago and her body is still waiting at a funeral home.  That makes her very anxious.  She reports she has to take care of her mother's affairs and currently has no help to get there.  She reports her mother used to live 30 hours away from where she is right now and it is very difficult for her to drive all the way there.  She has no financial help to fly there at this time.  She reports she has been having a lot of skin picking recently.    She is also in a toxic relationship with a boyfriend who she reports is making things worse for her.  Patient reports sleep is okay.  Patient denies any suicidality, homicidality or  perceptual disturbances.  Patient reports she continues to be in psychotherapy sessions with [name] and reports therapy sessions is beneficial.  Patient denies any other concerns today. Visit Diagnosis:    ICD-10-CM   1. PTSD (post-traumatic stress disorder)  F43.10 busPIRone (BUSPAR) 15 MG tablet  2. 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 Panic disorder  F41.0 busPIRone (BUSPAR) 15 MG tablet  4. Social anxiety disorder  F40.10   5. H/O borderline personality disorder  Z86.59     Past Psychiatric History: I have reviewed past psychiatric history from my progress note on 03/10/2018.  Past trials of Paxil, Prozac, Zoloft, Lamictal  Past Medical History:  Past Medical History:  Diagnosis Date  . Anxiety   . Arthritis   . Cancer [facility])    breast left  . Depression   . Fibromyalgia   . GERD (gastroesophageal reflux disease)   . Headache   . History of kidney stones    h/o  . Hypertension   . Hypothyroidism   . Thyroid disease     Past Surgical History:  Procedure Laterality Date  . APPENDECTOMY    . BREAST BIOPSY Left few yrs ago   stereotactic bx in [place], benign  . BREAST BIOPSY Left 12/26/2017   Affirm Bx IMC- X-Clip  . BREAST BIOPSY Left 01/14/2018   Affirm Bx- Coil clip- path pending  . BREAST RECONSTRUCTION WITH PLACEMENT OF TISSUE EXPANDER AND ALLODERM Left  04/08/2018   Procedure: LEFT BREAST IMMEDIATE  RECONSTRUCTION WITH EXPANDER AND FLEX HD;  Surgeon: Wallace Going, DO;  Location: [facility];  Service: Plastics;  Laterality: Left;  . COLONOSCOPY WITH PROPOFOL N/A 06/26/2018   Procedure: COLONOSCOPY WITH PROPOFOL;  Surgeon: Virgel Manifold, MD;  Location: [facility];  Service: Endoscopy;  Laterality: N/A;  . ESOPHAGOGASTRODUODENOSCOPY (EGD) WITH PROPOFOL N/A 06/26/2018   Procedure: ESOPHAGOGASTRODUODENOSCOPY (EGD) WITH PROPOFOL;  Surgeon: Virgel Manifold, MD;  Location: [facility];  Service: Endoscopy;  Laterality:  N/A;  . EYE SURGERY Bilateral    lasik  . FOOT BONE EXCISION    . IMAGE GUIDED SINUS SURGERY  10/220  . MASTECTOMY    . MASTECTOMY W/ SENTINEL NODE BIOPSY Left 04/08/2018   Procedure: MASTECTOMY WITH SENTINEL LYMPH NODE BIOPSY LEFT;  Surgeon: Fredirick Maudlin, MD;  Location: [facility];  Service: General;  Laterality: Left;  Marland Kitchen MASTOPEXY Right 08/04/2018   Procedure: MASTOPEXY;  Surgeon: Wallace Going, DO;  Location: [facility];  Service: Plastics;  Laterality: Right;  TOTAL CASE TIME SHOULD BE 3 HOURS, PLEASE  . REMOVAL OF TISSUE EXPANDER AND PLACEMENT OF IMPLANT Left 08/04/2018   Procedure: REMOVAL OF TISSUE EXPANDER AND PLACEMENT OF IMPLANT;  Surgeon: Wallace Going, DO;  Location: [facility];  Service: Plastics;  Laterality: Left;  . UMBILICAL HERNIA REPAIR  1983 and 1985    Family Psychiatric History: I have reviewed family psychiatric history from my progress note on 03/10/2018.  Family History:  Family History  Problem Relation Age of Onset  . Hypertension Mother   . Lung cancer Maternal Grandmother   . Heart disease Father   . Alcohol abuse Father   . Breast cancer Neg Hx   . Colon cancer Neg Hx     Social History: Reviewed social history from my progress note on 03/10/2018. Social History   Socioeconomic History  . Marital status: Divorced    Spouse name: Not on file  . Number of children: 1  . Years of education: Not on file  . Highest education level: High school graduate  Occupational History  . Not on file  Tobacco Use  . Smoking status: Former Smoker    Packs/day: 1.00    Years: 2.00    Pack years: 2.00    Types: Cigarettes    Quit date: 01/06/2017    Years since quitting: 2.1  . Smokeless tobacco: Never Used  Substance and Sexual Activity  . Alcohol use: Yes    Comment: rare beer   . Drug use: Not Currently  . Sexual activity: Not on file  Other Topics Concern  . Not on file  Social History Narrative   Home on disability [pysche problems];  transportation issues; worked in Landscape architect. 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 Quit smoking; ocassional alcohol.    Social Determinants of Health   Financial Resource Strain: High Risk  . Difficulty of Paying Living Expenses: Very hard  Food Insecurity: Food Insecurity Present  . Worried About Charity fundraiser in the Last Year: Often true  . Ran Out of Food in the Last Year: Often true  Transportation Needs: Unmet Transportation Needs  . Lack of Transportation (Medical): Yes  . Lack of Transportation (Non-Medical): Yes  Physical Activity: Inactive  . Days of Exercise per Week: 0 days  . Minutes of Exercise per Session: 0 min  Stress: Stress Concern Present  . Feeling of Stress : Very much  Social Connections: Unknown  . Frequency of  Communication with Friends and Family: Not on file  . Frequency of Social Gatherings with Friends and Family: Not on file  . Attends Religious Services: Never  . Active Member of Clubs or Organizations: No  . Attends Archivist Meetings: Never  . Marital Status: Divorced    Allergies:  Allergies  Allergen Reactions  . Tape Other (See Comments)    Paper tape after breast surgery/Burned skin   Tegaderm and other tape OK    Metabolic Disorder Labs: No results found for: HGBA1C, MPG No results found for: PROLACTIN No results found for: CHOL, TRIG, HDL, CHOLHDL, VLDL, LDLCALC No results found for: TSH  Therapeutic Level Labs: No results found for: LITHIUM No results found for: VALPROATE No components found for:  CBMZ  Current Medications: Current Outpatient Medications  Medication Sig Dispense Refill  . atorvastatin (LIPITOR) 40 MG tablet Take 40 mg by mouth at bedtime.     Marland Kitchen BIOTIN PO Take 100 mcg by mouth daily.    . busPIRone (BUSPAR) 15 MG tablet Take 1 tablet (15 mg total) by mouth 3 (three) times daily. 90 tablet 1  . Calcium-Magnesium-Zinc (CAL-MAG-ZINC PO) Take 1 tablet by mouth daily in the afternoon.    .  Chromium Picolinate (CHROMIUM PICOLATE PO) Take 1,000 mcg by mouth daily.    . citalopram (CELEXA) 40 MG tablet Take 1 tablet (40 mg total) by mouth every morning. 90 tablet 0  . Coenzyme Q10 (COQ10) 100 MG CAPS Take 100 mg by mouth every evening.    . cyclobenzaprine (FLEXERIL) 10 MG tablet 1/2-1 po bid prn    . diclofenac (VOLTAREN) 75 MG EC tablet twice daily with food for 1 month, then decrease to 75 mg once daily for 1 month, then discontinue. No over the counter pain relievers except Tylenol take with food    . doxazosin (CARDURA) 1 MG tablet Take 1 tablet (1 mg total) by mouth 2 (two) times daily. 180 tablet 3  . Flaxseed, Linseed, (FLAXSEED OIL PO) Take 1 capsule by mouth daily.    . fluticasone (FLONASE) 50 MCG/ACT nasal spray Place 2 sprays into both nostrils daily as needed for allergies.    Marland Kitchen gabapentin (NEURONTIN) 600 MG tablet Take 1 tablet (600 mg total) by mouth as directed. TAKE HALF TABLET IN THE AM AND HALF TABLET AT LUNCH AND 1 TABLET( 600 MG ) AT BEDTIME 180 tablet 0  . GLUCOSAMINE-CHONDROITIN PO Take 2 tablets by mouth daily. 1500-1200    . hydrochlorothiazide (HYDRODIURIL) 25 MG tablet Take 25 mg by mouth daily.     . hydrOXYzine (VISTARIL) 50 MG capsule Take 1 capsule (50 mg total) by mouth 2 (two) times daily as needed. For anxiety attacks 180 capsule 0  . levothyroxine (SYNTHROID, LEVOTHROID) 50 MCG tablet Take 50 mcg by mouth every other day.     . levothyroxine (SYNTHROID, LEVOTHROID) 75 MCG tablet Take 75 mcg by mouth every other day.    . losartan (COZAAR) 100 MG tablet Take 100 mg by mouth daily. for high blood pressure    . metoprolol (LOPRESSOR) 50 MG tablet Take 1 tablet (50 mg total) by mouth 2 (two) times daily. 60 tablet 0  . Multiple Vitamin (MULTIVITAMIN WITH MINERALS) TABS tablet Take 1 tablet by mouth daily.    . naproxen (NAPROSYN) 500 MG tablet Take 500 mg by mouth 2 (two) times daily as needed for moderate pain.    . Omega-3 Fatty Acids (FISH OIL PO) Take  1 capsule by  mouth daily.    Marland Kitchen omeprazole (PRILOSEC) 20 MG capsule Take 20 mg by mouth daily.     . Probiotic Product (PROBIOTIC DAILY PO) Take 2 capsules by mouth daily.    . QUEtiapine (SEROQUEL) 50 MG tablet Take 3 tablets (150 mg total) by mouth at bedtime. PRESCRIBED BY NEUROLOGY (Patient taking differently: Take 50 mg by mouth at bedtime. PRESCRIBED BY NEUROLOGY) 90 tablet 0  . zolpidem (AMBIEN) 5 MG tablet Take 1 tablet (5 mg total) by mouth at bedtime as needed for sleep. 30 tablet 1   No current facility-administered medications for this visit.     Musculoskeletal: Strength & Muscle Tone: UTA Gait & Station: Uses a scooter Patient leans: N/A  Psychiatric Specialty Exam: Review of Systems  Psychiatric/Behavioral: The patient is nervous/anxious.   All other systems reviewed and are negative.   There were no vitals taken for this visit.There is no height or weight on file to calculate BMI.  General Appearance: Casual  Eye Contact:  Fair  Speech:  Clear and Coherent  Volume:  Normal  Mood:  Anxious  Affect:  Congruent  Thought Process:  Goal Directed and Descriptions of Associations: Intact  Orientation:  Full (Time, Place, and Person)  Thought Content: Logical   Suicidal Thoughts:  No  Homicidal Thoughts:  No  Memory:  Immediate;   Fair Recent;   Fair Remote;   Fair  Judgement:  Fair  Insight:  Fair  Psychomotor Activity:  Normal  Concentration:  Concentration: Fair and Attention Span: Fair  Recall:  [employer] of Knowledge: Fair  Language: Fair  Akathisia:  No  Handed:  Right  AIMS (if indicated): UTA  Assets:  Communication Skills Desire for Improvement Housing  ADL's:  Intact  Cognition: WNL  Sleep:  Fair   Screenings:   Assessment and Plan: Aundra is a 59 year old Caucasian female, divorced, lives in [place], has a history of PTSD, skin picking disorder, panic attacks, social anxiety disorder, breast cancer status post mastectomy, recent construction  surgery, chronic pain was evaluated by telemedicine today.  She is biologically predisposed given her family history, history of trauma, multiple health issues.  Patient also with psychosocial stressors of the pandemic, recent death of her mother and inability to take care of her mother's affairs due to financial issues as well as the distance and no social support system.  Patient will continue to benefit from psychotherapy sessions and medication management.  Plan as noted below.  Plan PTSD-some improvement Celexa as prescribed Seroquel 150 mg p.o. nightly-prescribed by neurology Ambien 5 mg p.o. nightly as needed.  She has been limiting use. Continue CBT with [name]  Social anxiety disorder-improving Continue psychotherapy sessions  Panic attacks-unstable Gabapentin 300 mg p.o. twice daily and 600 mg at bedtime. Increase BuSpar to 15 mg p.o. 3 times daily Hydroxyzine 50 mg p.o. twice daily as needed Celexa as prescribed  Skin picking disorder-unstable Increase BuSpar to 15 mg p.o. 3 times daily Continue CBT  Follow-up in clinic in 3 weeks or sooner if needed.  March 8 at 1 PM  I have spent atleast 20 minutes non face to face with patient today. More than 50 % of the time was spent for ordering medications and test ,psychoeducation and supportive psychotherapy and care coordination,as well as documenting clinical information in electronic health record. This note was generated in part or whole with voice recognition software. Voice recognition is usually quite accurate but there are transcription errors that can and  very often do occur. I apologize for any typographical errors that were not detected and corrected.        Ursula Alert, MD 02/25/2019, 4:19 PM

## 2019-03-02 ENCOUNTER — Telehealth: Payer: Self-pay

## 2019-03-02 ENCOUNTER — Telehealth: Payer: Self-pay | Admitting: Plastic Surgery

## 2019-03-02 DIAGNOSIS — F424 Excoriation (skin-picking) disorder: Secondary | ICD-10-CM

## 2019-03-02 DIAGNOSIS — F410 Panic disorder [episodic paroxysmal anxiety] without agoraphobia: Secondary | ICD-10-CM

## 2019-03-02 DIAGNOSIS — F4310 Post-traumatic stress disorder, unspecified: Secondary | ICD-10-CM

## 2019-03-02 DIAGNOSIS — F4010 Social phobia, unspecified: Secondary | ICD-10-CM

## 2019-03-02 MED ORDER — CITALOPRAM HYDROBROMIDE 40 MG PO TABS: 40.0000 mg | ORAL_TABLET | ORAL | 0 refills | Status: DC

## 2019-03-02 MED ORDER — HYDROXYZINE PAMOATE 50 MG PO CAPS: 50.0000 mg | ORAL_CAPSULE | Freq: Two times a day (BID) | ORAL | 0 refills | Status: DC | PRN

## 2019-03-02 NOTE — Telephone Encounter (Signed)

## 2019-03-02 NOTE — Telephone Encounter (Signed)
I have sent Celexa again to pharmacy with noted disregard if already received since this was sent out a few days ago.

## 2019-03-02 NOTE — Telephone Encounter (Signed)
received a fax requesting a refill on the hydroxyzine 50mg  

## 2019-03-02 NOTE — Telephone Encounter (Signed)
Sent Vistaril to pharmacy. 

## 2019-03-02 NOTE — Telephone Encounter (Signed)
Received a fax  For citaplopram 40mg    citalopram (CELEXA) 40 MG tablet Medication Date: 01/27/2019 Department: [facility] Ordering/Authorizing: Ursula Alert, MD  Order Providers  Prescribing Provider Encounter Provider  Ursula Alert, MD Ursula Alert, MD  Outpatient Medication Detail   Disp Refills Start End   citalopram (CELEXA) 40 MG tablet 90 tablet 0 01/27/2019    Sig - Route: Take 1 tablet (40 mg total) by mouth every morning. - Oral   Sent to pharmacy as: citalopram (CELEXA) 40 MG tablet   E-Prescribing Status: Receipt confirmed by pharmacy (01/27/2019 1:22 PM EST)   Associated Diagnoses  PTSD (post-traumatic stress disorder) - Primary     Skin-picking disorder     Panic disorder     Social anxiety disorder     Pharmacy  [id number] - [place], [place] - [address]

## 2019-03-02 NOTE — Telephone Encounter (Signed)
  Received fax requesting a refill   hydrOXYzine (VISTARIL) 50 MG capsule Medication Date: 12/23/2018 Department: [facility] Ordering/Authorizing: Ursula Alert, MD  Order Providers  Prescribing Provider Encounter Provider  Ursula Alert, MD Ursula Alert, MD  Outpatient Medication Detail   Disp Refills Start End   hydrOXYzine (VISTARIL) 50 MG capsule 180 capsule 0 12/23/2018    Sig - Route: Take 1 capsule (50 mg total) by mouth 2 (two) times daily as needed. For anxiety attacks - Oral   Sent to pharmacy as: hydrOXYzine (VISTARIL) 50 MG capsule   E-Prescribing Status: Receipt confirmed by pharmacy (12/23/2018 2:46 PM EST)   Associated Diagnoses  Skin-picking disorder     Panic disorder     Pharmacy  [id number] - [place], [place] - [address]

## 2019-03-03 ENCOUNTER — Encounter: Payer: Self-pay | Admitting: Plastic Surgery

## 2019-03-03 ENCOUNTER — Ambulatory Visit (INDEPENDENT_AMBULATORY_CARE_PROVIDER_SITE_OTHER): Payer: Medicare Other | Admitting: Plastic Surgery

## 2019-03-03 ENCOUNTER — Other Ambulatory Visit: Payer: Self-pay

## 2019-03-03 VITALS — BP 162/99 | HR 66 | Temp 98.0°F | Ht 66.0 in | Wt 198.8 lb

## 2019-03-03 DIAGNOSIS — N651 Disproportion of reconstructed breast: Secondary | ICD-10-CM

## 2019-03-03 DIAGNOSIS — Z9012 Acquired absence of left breast and nipple: Secondary | ICD-10-CM | POA: Diagnosis not present

## 2019-03-03 NOTE — Progress Notes (Signed)
   Subjective:    Patient ID: Kelli Logan, female    DOB: Jan 04, 1961, 59 y.o.   MRN: [id number]  59 year old female here for follow-up after undergoing breast reconstruction.  She had left breast cancer and had a mastectomy with implant-based reconstruction.  She had a right breast mastopexy for symmetry.  She has been doing well overall from the surgery.  She has a little bit of asymmetry with the left breast.  There is some contracture from the expansion on the medial aspect of the left breast.  She has some excess tissue laterally.  This is creating some discomfort and asymmetry.  She also complains of numbness in the lateral aspect of her left breast.  This is related to the mastectomy.  This was explained to the patient.  Any surgical intervention cannot be expected to alter the numbness for means of improvement.  Her incisions are all intact and healing very nicely.     Review of Systems  Constitutional: Positive for activity change. Negative for appetite change.  HENT: Negative.   Eyes: Negative.   Respiratory: Negative.   Cardiovascular: Negative.   Gastrointestinal: Negative.   Genitourinary: Negative.   Musculoskeletal: Negative.   Skin: Negative.  Negative for color change and wound.       Objective:   Physical Exam Vitals and nursing note reviewed.  Constitutional:      Appearance: Normal appearance.  HENT:     Head: Normocephalic and atraumatic.  Cardiovascular:     Rate and Rhythm: Normal rate.  Pulmonary:     Effort: Pulmonary effort is normal.  Abdominal:     General: Abdomen is flat. There is no distension.  Skin:    General: Skin is warm.  Neurological:     General: No focal deficit present.     Mental Status: She is alert.  Psychiatric:        Mood and Affect: Mood normal.        Thought Content: Thought content normal.       Assessment & Plan:     ICD-10-CM   1. Status post left mastectomy  Z90.12   2. Breast asymmetry following reconstructive  surgery  N65.1     Recommend release of left breast contracture and fat grafting for symmetry. Surgery will not improve any lateral breast numbness.  Pictures were obtained of the patient and placed in the chart with the patient's or guardian's permission.

## 2019-03-16 ENCOUNTER — Other Ambulatory Visit: Payer: Self-pay

## 2019-03-16 ENCOUNTER — Ambulatory Visit (INDEPENDENT_AMBULATORY_CARE_PROVIDER_SITE_OTHER): Payer: Medicare Other | Admitting: Psychiatry

## 2019-03-16 DIAGNOSIS — Z5329 Procedure and treatment not carried out because of patient's decision for other reasons: Secondary | ICD-10-CM

## 2019-03-16 NOTE — Progress Notes (Signed)
No response to call or text. 

## 2019-03-17 ENCOUNTER — Ambulatory Visit (INDEPENDENT_AMBULATORY_CARE_PROVIDER_SITE_OTHER): Payer: Medicare Other | Admitting: Licensed Clinical Social Worker

## 2019-03-17 ENCOUNTER — Encounter: Payer: Self-pay | Admitting: Licensed Clinical Social Worker

## 2019-03-17 DIAGNOSIS — F4310 Post-traumatic stress disorder, unspecified: Secondary | ICD-10-CM

## 2019-03-17 NOTE — Progress Notes (Signed)
Virtual Visit via Video Note  I connected with Kelli Logan on 03/17/19 at  1:30 PM EST by a video enabled telemedicine application and verified that I am speaking with the correct person using two identifiers.   I discussed the limitations of evaluation and management by telemedicine and the availability of in person appointments. The patient expressed understanding and agreed to proceed.   I discussed the assessment and treatment plan with the patient. The patient was provided an opportunity to ask questions and all were answered. The patient agreed with the plan and demonstrated an understanding of the instructions.   The patient was advised to call back or seek an in-person evaluation if the symptoms worsen or if the condition fails to improve as anticipated.  I provided 53 minutes of non-face-to-face time during this encounter.   Alden Hipp, LCSW    THERAPIST PROGRESS NOTE  Session Time: 1330  Participation Level: Active  Behavioral Response: NeatAlertAnxious  Type of Therapy: Individual Therapy  Treatment Goals addressed: Coping  Interventions: Solution Focused and Strength-based  Summary: Kelli Logan is a 59 y.o. female who presents with continued symptoms related to her diagnosis. Kelli Logan reports doing "okay," since our last session. Kelli Logan reports feeling very stressed regarding writing a check she knew she did not have the money to clear for her rent, "if they don't process it until the end of the week, I should be okay." Kelli Logan reports feeling really nervous but stated she did not know what else to do. LCSW validated Kelli Logan's feelings and encouraged her to communicate with her housing office to alert them of what's going on. We discussed how communication is vital in most situations, and reviewed how she could approach this situation. Kelli Logan expressed understanding and agreement. Kelli Logan reports still feeling anxious about her trip to [facility] to settle her  mother's estate. LCSW encouraged Kelli Logan to make a list of everything that needs to be done in order to make things seems less stressful and overwhelming. Kelli Logan expressed understanding and agreement.   Suicidal/Homicidal: No  Therapist Response: Kelli Logan continues to work towards her tx goals but has not yet reached them. We will continue to work on improving emotional regulation skills and distress tolerance moving forward.   Plan: Return again in 2 weeks.  Diagnosis: Axis I: Post Traumatic Stress Disorder    Axis II: No diagnosis    Alden Hipp, LCSW 03/17/2019

## 2019-03-23 ENCOUNTER — Inpatient Hospital Stay: Payer: Medicare Other | Attending: Internal Medicine | Admitting: Internal Medicine

## 2019-03-31 ENCOUNTER — Other Ambulatory Visit: Payer: Self-pay | Admitting: Psychiatry

## 2019-03-31 DIAGNOSIS — F4310 Post-traumatic stress disorder, unspecified: Secondary | ICD-10-CM

## 2019-03-31 DIAGNOSIS — F410 Panic disorder [episodic paroxysmal anxiety] without agoraphobia: Secondary | ICD-10-CM

## 2019-03-31 DIAGNOSIS — F424 Excoriation (skin-picking) disorder: Secondary | ICD-10-CM

## 2019-04-06 NOTE — Progress Notes (Deleted)
ICD-10-CM   1. Breast asymmetry following reconstructive surgery  N65.1   2. S/P breast reconstruction, left  Z98.890   3. Malignant neoplasm of lower-outer quadrant of left breast of female, estrogen receptor positive [facility])  C50.512    Z17.0       Patient ID: Kelli Logan, female    DOB: 02-09-1960, 59 y.o.   MRN: [id number]   History of Present Illness: Kelli Logan is a 59 y.o.  female  with a history of asymmetry and scar contracture of left breast after reconstructive surgery.  She presents for preoperative evaluation for upcoming procedure, fat grafting and release of scar contracture of left breast, scheduled for 04/30/2019 with Dr. Marla Roe.  Summary from previous visit: Patient had left breast cancer and had a mastectomy with implant-based reconstruction along with right breast mastopexy for symmetry.  She has a little bit of asymmetry with the left breast with some contracture on the medial aspect of the left breast.  She also has some excess tissue laterally.  Job: ***  PMH Significant for: Hypothyroidism, HTN, fibromyalgia, depression/anxiety, left breast cancer.  The patient {HAS HAS [id number] had problems with anesthesia. ***  Past Medical History: Allergies: Allergies  Allergen Reactions  . Tape Other (See Comments)    Paper tape after breast surgery/Burned skin   Tegaderm and other tape OK    Current Medications:  Current Outpatient Medications:  .  atorvastatin (LIPITOR) 40 MG tablet, Take 40 mg by mouth at bedtime. , Disp: , Rfl:  .  BIOTIN PO, Take 100 mcg by mouth daily., Disp: , Rfl:  .  busPIRone (BUSPAR) 15 MG tablet, TAKE 1 TABLET (15 MG TOTAL) BY MOUTH 3 (THREE) TIMES DAILY., Disp: 270 tablet, Rfl: 1 .  Calcium-Magnesium-Zinc (CAL-MAG-ZINC PO), Take 1 tablet by mouth daily in the afternoon., Disp: , Rfl:  .  Chromium Picolinate (CHROMIUM PICOLATE PO), Take 1,000 mcg by mouth daily., Disp: , Rfl:  .  citalopram (CELEXA) 40 MG tablet, Take 1  tablet (40 mg total) by mouth every morning., Disp: 90 tablet, Rfl: 0 .  Coenzyme Q10 (COQ10) 100 MG CAPS, Take 100 mg by mouth every evening., Disp: , Rfl:  .  cyclobenzaprine (FLEXERIL) 10 MG tablet, 1/2-1 po bid prn, Disp: , Rfl:  .  diclofenac (VOLTAREN) 75 MG EC tablet, twice daily with food for 1 month, then decrease to 75 mg once daily for 1 month, then discontinue. No over the counter pain relievers except Tylenol take with food, Disp: , Rfl:  .  doxazosin (CARDURA) 1 MG tablet, Take 1 tablet (1 mg total) by mouth 2 (two) times daily., Disp: 180 tablet, Rfl: 3 .  Flaxseed, Linseed, (FLAXSEED OIL PO), Take 1 capsule by mouth daily., Disp: , Rfl:  .  fluticasone (FLONASE) 50 MCG/ACT nasal spray, Place 2 sprays into both nostrils daily as needed for allergies., Disp: , Rfl:  .  gabapentin (NEURONTIN) 600 MG tablet, Take 1 tablet (600 mg total) by mouth as directed. TAKE HALF TABLET IN THE AM AND HALF TABLET AT LUNCH AND 1 TABLET( 600 MG ) AT BEDTIME, Disp: 180 tablet, Rfl: 0 .  GLUCOSAMINE-CHONDROITIN PO, Take 2 tablets by mouth daily. 1500-1200, Disp: , Rfl:  .  hydrochlorothiazide (HYDRODIURIL) 25 MG tablet, Take 25 mg by mouth daily. , Disp: , Rfl:  .  hydrOXYzine (VISTARIL) 50 MG capsule, Take 1 capsule (50 mg total) by mouth 2 (two) times daily as needed. For anxiety attacks, Disp: 180 capsule, Rfl:  0 .  levothyroxine (SYNTHROID, LEVOTHROID) 50 MCG tablet, Take 50 mcg by mouth every other day. , Disp: , Rfl:  .  levothyroxine (SYNTHROID, LEVOTHROID) 75 MCG tablet, Take 75 mcg by mouth every other day., Disp: , Rfl:  .  losartan (COZAAR) 100 MG tablet, Take 100 mg by mouth daily. for high blood pressure, Disp: , Rfl:  .  metoprolol (LOPRESSOR) 50 MG tablet, Take 1 tablet (50 mg total) by mouth 2 (two) times daily., Disp: 60 tablet, Rfl: 0 .  Multiple Vitamin (MULTIVITAMIN WITH MINERALS) TABS tablet, Take 1 tablet by mouth daily., Disp: , Rfl:  .  naproxen (NAPROSYN) 500 MG tablet, Take 500  mg by mouth 2 (two) times daily as needed for moderate pain., Disp: , Rfl:  .  Omega-3 Fatty Acids (FISH OIL PO), Take 1 capsule by mouth daily., Disp: , Rfl:  .  omeprazole (PRILOSEC) 20 MG capsule, Take 20 mg by mouth daily. , Disp: , Rfl:  .  Probiotic Product (PROBIOTIC DAILY PO), Take 2 capsules by mouth daily., Disp: , Rfl:  .  QUEtiapine (SEROQUEL) 50 MG tablet, Take 3 tablets (150 mg total) by mouth at bedtime. PRESCRIBED BY NEUROLOGY (Patient taking differently: Take 50 mg by mouth at bedtime. PRESCRIBED BY NEUROLOGY), Disp: 90 tablet, Rfl: 0 .  zolpidem (AMBIEN) 5 MG tablet, Take 1 tablet (5 mg total) by mouth at bedtime as needed for sleep., Disp: 30 tablet, Rfl: 1  Past Medical Problems: Past Medical History:  Diagnosis Date  . Anxiety   . Arthritis   . Cancer [facility])    breast left  . Depression   . Fibromyalgia   . GERD (gastroesophageal reflux disease)   . Headache   . History of kidney stones    h/o  . Hypertension   . Hypothyroidism   . Thyroid disease     Past Surgical History: Past Surgical History:  Procedure Laterality Date  . APPENDECTOMY    . BREAST BIOPSY Left few yrs ago   stereotactic bx in [place], benign  . BREAST BIOPSY Left 12/26/2017   Affirm Bx [facility]- X-Clip  . BREAST BIOPSY Left 01/14/2018   Affirm Bx- Coil clip- path pending  . BREAST RECONSTRUCTION WITH PLACEMENT OF TISSUE EXPANDER AND ALLODERM Left 04/08/2018   Procedure: LEFT BREAST IMMEDIATE  RECONSTRUCTION WITH EXPANDER AND FLEX HD;  Surgeon: Wallace Going, DO;  Location: [facility] ORS;  Service: Plastics;  Laterality: Left;  . COLONOSCOPY WITH PROPOFOL N/A 06/26/2018   Procedure: COLONOSCOPY WITH PROPOFOL;  Surgeon: Virgel Manifold, MD;  Location: [facility];  Service: Endoscopy;  Laterality: N/A;  . ESOPHAGOGASTRODUODENOSCOPY (EGD) WITH PROPOFOL N/A 06/26/2018   Procedure: ESOPHAGOGASTRODUODENOSCOPY (EGD) WITH PROPOFOL;  Surgeon: Virgel Manifold, MD;  Location: [facility];   Service: Endoscopy;  Laterality: N/A;  . EYE SURGERY Bilateral    lasik  . FOOT BONE EXCISION    . IMAGE GUIDED SINUS SURGERY  10/220  . MASTECTOMY    . MASTECTOMY W/ SENTINEL NODE BIOPSY Left 04/08/2018   Procedure: MASTECTOMY WITH SENTINEL LYMPH NODE BIOPSY LEFT;  Surgeon: Fredirick Maudlin, MD;  Location: [facility];  Service: General;  Laterality: Left;  Marland Kitchen MASTOPEXY Right 08/04/2018   Procedure: MASTOPEXY;  Surgeon: Wallace Going, DO;  Location: [facility];  Service: Plastics;  Laterality: Right;  TOTAL CASE TIME SHOULD BE 3 HOURS, PLEASE  . REMOVAL OF TISSUE EXPANDER AND PLACEMENT OF IMPLANT Left 08/04/2018   Procedure: REMOVAL OF TISSUE EXPANDER AND PLACEMENT OF IMPLANT;  Surgeon: Wallace Going, DO;  Location: [facility];  Service: Plastics;  Laterality: Left;  . UMBILICAL HERNIA REPAIR  1983 and 1985    Social History: Social History   Socioeconomic History  . Marital status: Divorced    Spouse name: Not on file  . Number of children: 1  . Years of education: Not on file  . Highest education level: High school graduate  Occupational History  . Not on file  Tobacco Use  . Smoking status: Former Smoker    Packs/day: 1.00    Years: 2.00    Pack years: 2.00    Types: Cigarettes    Quit date: 01/06/2017    Years since quitting: 2.2  . Smokeless tobacco: Never Used  Substance and Sexual Activity  . Alcohol use: Yes    Comment: rare beer   . Drug use: Not Currently  . Sexual activity: Not on file  Other Topics Concern  . 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 Quit smoking; ocassional alcohol.    Social Determinants of Health   Financial Resource Strain:   . Difficulty of Paying Living Expenses:   Food Insecurity:   . Worried About Charity fundraiser in the Last Year:   . Arboriculturist in the Last Year:   Transportation Needs:   . Consulting civil engineer (Medical):   Marland Kitchen Lack of Transportation (Non-Medical):   Physical Activity:   . Days of Exercise per Week:   . Minutes of Exercise per Session:   Stress:   . Feeling of Stress :   Social Connections:   . Frequency of Communication with Friends and Family:   . Frequency of Social Gatherings with Friends and Family:   . Attends Religious Services:   . Active Member of Clubs or Organizations:   . Attends Archivist Meetings:   Marland Kitchen Marital Status:   Intimate Partner Violence:   . Fear of Current or Ex-Partner:   . Emotionally Abused:   Marland Kitchen Physically Abused:   . Sexually Abused:     Family History: Family History  Problem Relation Age of Onset  . Hypertension Mother   . Lung cancer Maternal Grandmother   . Heart disease Father   . Alcohol abuse Father   . Breast cancer Neg Hx   . Colon cancer Neg Hx     Review of Systems: ROS  Physical Exam: Vital Signs There were no vitals taken for this visit. Physical Exam  Assessment/Plan:  Ms. Fazzolari scheduled for fat grafting and release of scar contracture of left breast with Dr. Marla Roe.  Risks, benefits, and alternatives of procedure discussed, questions answered and consent obtained.    Smoking Status: ***; Counseling Given? *** Last Mammogram: MR 02/05/18; Results: Right breast: no evidence of right breast malignancy. Left breast - malignancy resulting in mastectomy  Caprini Score: ***; Risk Factors include: 59 yr-old female, hx breast cancer, BMI *** 25, and length of planned surgery. Recommendation for mechanical *** pharmacological prophylaxis during surgery. Encourage early ambulation.   Pictures obtained: 03/03/19  Post-op Rx sent to pharmacy: ***  Patient was provided with general surgical risk consent prior to their appointment.  They had adequate time to read through the consent forms and we also discussed them in person together during this preop appointment.  All of their questions were  answered to their content.  Recommended calling  if they have any further questions.  Consent form to be scanned into patient's chart.    Electronically signed by: Threasa Heads, PA-C 04/06/2019 10:01 PM

## 2019-04-07 ENCOUNTER — Encounter: Payer: Medicare Other | Admitting: Plastic Surgery

## 2019-04-08 ENCOUNTER — Encounter: Payer: Self-pay | Admitting: Surgical

## 2019-04-08 ENCOUNTER — Other Ambulatory Visit: Payer: Self-pay | Admitting: Surgical

## 2019-04-08 ENCOUNTER — Other Ambulatory Visit: Payer: Self-pay

## 2019-04-08 ENCOUNTER — Ambulatory Visit (INDEPENDENT_AMBULATORY_CARE_PROVIDER_SITE_OTHER): Payer: Medicare Other | Admitting: Surgical

## 2019-04-08 VITALS — BP 141/88 | HR 74 | Temp 96.6°F | Ht 66.0 in | Wt 196.2 lb

## 2019-04-08 DIAGNOSIS — N651 Disproportion of reconstructed breast: Secondary | ICD-10-CM

## 2019-04-08 DIAGNOSIS — Z9012 Acquired absence of left breast and nipple: Secondary | ICD-10-CM

## 2019-04-08 DIAGNOSIS — Z9889 Other specified postprocedural states: Secondary | ICD-10-CM

## 2019-04-08 HISTORY — PX: MASTECTOMY: SHX3

## 2019-04-08 NOTE — H&P (View-Only) (Signed)
Patient ID: Kelli Logan, female    DOB: Nov 07, 1960, 59 y.o.   MRN: [id number]  Chief Complaint  Patient presents with  . Pre-op Exam    for fat grafting and release of scar contracture to (L) breast      ICD-10-CM   1. Breast asymmetry following reconstructive surgery  N65.1   2. S/P breast reconstruction, left  Z98.890   3. Status post left mastectomy  Z90.12     History of Present Illness: Kelli Logan is a 59 y.o.  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  She also underwent right breast reduction/mastopexy for symmetry.  She has some left breast contracture along the medial aspect of her left breast and some excess tissue laterally causing discomfort and asymmetry.  Patient underwent left mastectomy with sentinel lymph node biopsy, immediate reconstruction of left side on 04/08/2018. Patient underwent removal of left tissue expander and placement of implant, right mastopexy on 08/04/2018 by Dr. Marla Roe.  She had placement of a [id number] cc [employer]or silicone implant.  She presents for preoperative evaluation for upcoming procedure, fat grafting to left breast, release of scar contracture of left breast, scheduled for 04/30/2019 with Dr. Marla Roe.  The patient has not had problems with anesthesia.  No personal or family history of DVT/PE.  No history of CVA/MI.  No history of any bleeding or clotting disorders.  Not taking any blood thinners.  Quit smoking a few years ago.  Patient has a past medical history of hypothyroidism, hypertension, fibromyalgia, depression/anxiety, GERD.  She also reports that she has OSA, believes it is due to weight gain after starting hormone replacement therapy.  She is planning to have this evaluated, but reports she would like to wait until after surgery.  Her  mother recently passed away and she is dealing with her loss at this time.   She has a surgical history of appendectomy, left breast mastectomy, left breast reconstruction, umbilical hernia repair, right breast mastopexy.  Past Medical History: Allergies: Allergies  Allergen Reactions  . Tape Other (See Comments)    Paper tape after breast surgery/Burned skin   Tegaderm and other tape OK    Current Medications:  Current Outpatient Medications:  .  atorvastatin (LIPITOR) 40 MG tablet, Take 40 mg by mouth at bedtime. , Disp: , Rfl:  .  BIOTIN PO, Take 100 mcg by mouth daily., Disp: , Rfl:  .  busPIRone (BUSPAR) 15 MG tablet, TAKE 1 TABLET (15 MG TOTAL) BY MOUTH 3 (THREE) TIMES DAILY., Disp: 270 tablet, Rfl: 1 .  Calcium-Magnesium-Zinc (CAL-MAG-ZINC PO), Take 1 tablet by mouth daily in the afternoon., Disp: , Rfl:  .  Chromium Picolinate (CHROMIUM PICOLATE PO), Take 1,000 mcg by mouth daily., Disp: , Rfl:  .  citalopram (CELEXA) 40 MG tablet, Take 1 tablet (40 mg total) by mouth every morning., Disp: 90 tablet, Rfl: 0 .  Coenzyme Q10 (COQ10) 100 MG CAPS, Take 100 mg by mouth every evening., Disp: , Rfl:  .  cyclobenzaprine (FLEXERIL) 10 MG tablet, 1/2-1 po bid prn, Disp: , Rfl:  .  diclofenac (VOLTAREN) 75 MG EC tablet, twice daily with food for 1 month, then decrease to 75 mg once daily for 1 month, then discontinue. No over the counter pain relievers except Tylenol take with food, Disp: , Rfl:  .  doxazosin (CARDURA) 1 MG tablet, Take 1 tablet (1 mg total) by mouth 2 (two) times daily., Disp: 180 tablet, Rfl: 3 .  Flaxseed, Linseed, (FLAXSEED OIL PO), Take 1 capsule by mouth daily., Disp: , Rfl:  .  gabapentin (NEURONTIN) 600 MG tablet, Take 1 tablet (600 mg total) by mouth as directed. TAKE HALF TABLET IN THE AM AND HALF TABLET AT LUNCH AND 1 TABLET( 600 MG ) AT BEDTIME, Disp: 180 tablet, Rfl: 0 .  GLUCOSAMINE-CHONDROITIN PO, Take 2 tablets by mouth daily. 1500-1200, Disp: , Rfl:  .   hydrochlorothiazide (HYDRODIURIL) 25 MG tablet, Take 25 mg by mouth daily. , Disp: , Rfl:  .  hydrOXYzine (VISTARIL) 50 MG capsule, Take 1 capsule (50 mg total) by mouth 2 (two) times daily as needed. For anxiety attacks, Disp: 180 capsule, Rfl: 0 .  levothyroxine (SYNTHROID, LEVOTHROID) 50 MCG tablet, Take 50 mcg by mouth every other day. , Disp: , Rfl:  .  levothyroxine (SYNTHROID, LEVOTHROID) 75 MCG tablet, Take 75 mcg by mouth every other day., Disp: , Rfl:  .  losartan (COZAAR) 100 MG tablet, Take 100 mg by mouth daily. for high blood pressure, Disp: , Rfl:  .  metoprolol (LOPRESSOR) 50 MG tablet, Take 1 tablet (50 mg total) by mouth 2 (two) times daily., Disp: 60 tablet, Rfl: 0 .  Multiple Vitamin (MULTIVITAMIN WITH MINERALS) TABS tablet, Take 1 tablet by mouth daily., Disp: , Rfl:  .  naproxen (NAPROSYN) 500 MG tablet, Take 500 mg by mouth 2 (two) times daily as needed for moderate pain., Disp: , Rfl:  .  Omega-3 Fatty Acids (FISH OIL PO), Take 1 capsule by mouth daily., Disp: , Rfl:  .  omeprazole (PRILOSEC) 20 MG capsule, Take 20 mg by mouth daily. , Disp: , Rfl:  .  Probiotic Product (PROBIOTIC DAILY PO), Take 2 capsules by mouth daily., Disp: , Rfl:  .  QUEtiapine (SEROQUEL) 50 MG tablet, Take 3 tablets (150 mg total) by mouth at bedtime. PRESCRIBED BY NEUROLOGY (Patient taking differently: Take 50 mg by mouth at bedtime. PRESCRIBED BY NEUROLOGY), Disp: 90 tablet, Rfl: 0  Past Medical Problems: Past Medical History:  Diagnosis Date  . Anxiety   . Arthritis   . Cancer [facility])    breast left  . Depression   . Fibromyalgia   . GERD (gastroesophageal reflux disease)   . Headache   . History of kidney stones    h/o  . Hypertension   . Hypothyroidism   . Thyroid disease     Past Surgical History: Past Surgical History:  Procedure Laterality Date  . APPENDECTOMY    . BREAST BIOPSY Left few yrs ago   stereotactic bx in [place], benign  . BREAST BIOPSY Left 12/26/2017   Affirm Bx  [facility]- X-Clip  . BREAST BIOPSY Left 01/14/2018   Affirm Bx- Coil clip- path pending  . BREAST RECONSTRUCTION WITH PLACEMENT OF TISSUE EXPANDER AND ALLODERM Left 04/08/2018   Procedure: LEFT BREAST IMMEDIATE  RECONSTRUCTION WITH EXPANDER AND FLEX HD;  Surgeon: Wallace Going, DO;  Location: [facility];  Service: Plastics;  Laterality: Left;  . COLONOSCOPY WITH PROPOFOL N/A 06/26/2018   Procedure: COLONOSCOPY WITH PROPOFOL;  Surgeon: Virgel Manifold, MD;  Location: [facility];  Service: Endoscopy;  Laterality: N/A;  . ESOPHAGOGASTRODUODENOSCOPY (EGD) WITH PROPOFOL N/A 06/26/2018   Procedure: ESOPHAGOGASTRODUODENOSCOPY (EGD) WITH PROPOFOL;  Surgeon: Virgel Manifold, MD;  Location: [facility];  Service: Endoscopy;  Laterality: N/A;  . EYE SURGERY Bilateral  lasik  . FOOT BONE EXCISION    . IMAGE GUIDED SINUS SURGERY  10/220  . MASTECTOMY    . MASTECTOMY W/ SENTINEL NODE BIOPSY Left 04/08/2018   Procedure: MASTECTOMY WITH SENTINEL LYMPH NODE BIOPSY LEFT;  Surgeon: Fredirick Maudlin, MD;  Location: [facility];  Service: General;  Laterality: Left;  Marland Kitchen MASTOPEXY Right 08/04/2018   Procedure: MASTOPEXY;  Surgeon: Wallace Going, DO;  Location: [facility];  Service: Plastics;  Laterality: Right;  TOTAL CASE TIME SHOULD BE 3 HOURS, PLEASE  . REMOVAL OF TISSUE EXPANDER AND PLACEMENT OF IMPLANT Left 08/04/2018   Procedure: REMOVAL OF TISSUE EXPANDER AND PLACEMENT OF IMPLANT;  Surgeon: Wallace Going, DO;  Location: [facility];  Service: Plastics;  Laterality: Left;  . UMBILICAL HERNIA REPAIR  1983 and 1985    Social History: Social History   Socioeconomic History  . Marital status: Divorced    Spouse name: Not on file  . Number of children: 1  . Years of education: Not on file  . Highest education level: High school graduate  Occupational History  . Not on file  Tobacco Use  . Smoking status: Former Smoker    Packs/day: 1.00    Years: 2.00    Pack years: 2.00    Types:  Cigarettes    Quit date: 01/06/2017    Years since quitting: 2.2  . Smokeless tobacco: Never Used  Substance and Sexual Activity  . Alcohol use: Yes    Comment: rare beer   . Drug use: Not Currently  . Sexual activity: Not on file  Other Topics Concern  . 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 Quit smoking; ocassional alcohol.    Social Determinants of Health   Financial Resource Strain:   . Difficulty of Paying Living Expenses:   Food Insecurity:   . Worried About Charity fundraiser in the Last Year:   . Arboriculturist in the Last Year:   Transportation Needs:   . Film/video editor (Medical):   Marland Kitchen Lack of Transportation (Non-Medical):   Physical Activity:   . Days of Exercise per Week:   . Minutes of Exercise per Session:   Stress:   . Feeling of Stress :   Social Connections:   . Frequency of Communication with Friends and Family:   . Frequency of Social Gatherings with Friends and Family:   . Attends Religious Services:   . Active Member of Clubs or Organizations:   . Attends Archivist Meetings:   Marland Kitchen Marital Status:   Intimate Partner Violence:   . Fear of Current or Ex-Partner:   . Emotionally Abused:   Marland Kitchen Physically Abused:   . Sexually Abused:     Family History: Family History  Problem Relation Age of Onset  . Hypertension Mother   . Lung cancer Maternal Grandmother   . Heart disease Father   . Alcohol abuse Father   . Breast cancer Neg Hx   . Colon cancer Neg Hx     Review of Systems: Review of Systems  Constitutional: Negative.   Respiratory: Negative.   Cardiovascular: Negative.   Gastrointestinal: Negative.   Genitourinary: Negative.   Musculoskeletal: Positive for myalgias.  Skin: Negative.   Neurological: Negative.     Physical Exam: Vital Signs BP (!) 141/88 (BP Location: Right Arm, Patient  Position: Sitting, Cuff Size: Normal)  Pulse 74   Temp (!) 96.6 F (35.9 C) (Temporal)   Ht 5\' 6"  (1.676 m)   Wt 196 lb 3.2 oz (89 kg)   SpO2 99%   BMI 31.67 kg/m   Physical Exam Nursing note reviewed. Exam conducted with a chaperone present.  Constitutional:      General: She is not in acute distress.    Appearance: Normal appearance. She is not ill-appearing.  HENT:     Head: Normocephalic and atraumatic.  Cardiovascular:     Rate and Rhythm: Normal rate and regular rhythm.     Pulses: Normal pulses.     Heart sounds: Normal heart sounds.  Pulmonary:     Effort: Pulmonary effort is normal.     Breath sounds: Normal breath sounds.  Chest:    Abdominal:     General: Abdomen is flat. There is no distension.     Palpations: Abdomen is soft.     Tenderness: There is no abdominal tenderness.  Musculoskeletal:        General: No swelling or tenderness. Normal range of motion.     Cervical back: Normal range of motion and neck supple.  Skin:    General: Skin is warm and dry.  Neurological:     General: No focal deficit present.     Mental Status: She is alert and oriented to person, place, and time. Mental status is at baseline.  Psychiatric:        Mood and Affect: Mood normal.        Behavior: Behavior normal.    Assessment/Plan: The patient is scheduled for fat grafting to left breast, release of scar contracture of left breast with Dr. Marla Roe on 04/30/19.  Risks, benefits, and alternatives of procedure discussed, questions answered and consent obtained.    Mammogram: 02/05/2018, status post left breast partial mastectomy, nipple sparing.  May need further evaluation prior to surgery.  Smoking status: Quit smoking a few years ago. Caprini score: High, 6, recommend mechanical prophylaxis, early ambulation, possible chemoprophylaxis on day of surgery and postoperatively.  Patient with a history of cancer, surgery time greater than 45 minutes, BMI greater than 25.   Patient provided with general consent form covering surgical risks associated with surgical intervention. Patient was allocated adequate time prior to appointment to read through, initial and sign that they agree that they are aware of the risks associated. We also covered the risks in person during this pre-op appointment. I answered all of the patient's questions to their content and informed them to call with any further questions or concerns prior to surgery and I would be happy to discuss with them further.   The risks that can be encountered with and after liposuction were discussed and include the following but no limited to these:  Asymmetry, fluid accumulation, firmness of the area, fat necrosis with death of fat tissue, bleeding, infection, delayed healing, anesthesia risks, skin sensation changes, injury to structures including nerves, blood vessels, and muscles which may be temporary or permanent, allergies to tape, suture materials and glues, blood products, topical preparations or injected agents, skin and contour irregularities, skin discoloration and swelling, deep vein thrombosis, cardiac and pulmonary complications, pain, which may persist, persistent pain, recurrence of the lesion, poor healing of the incision, possible need for revisional surgery or staged procedures. Thiere can also be persistent swelling, poor wound healing, rippling or loose skin, worsening of cellulite, swelling. Any change in weight fluctuations can alter the outcome.  Reached out to patient's  oncologist team to determine plan for further imaging/evaluation of patient's left breast.  Also consulted with Dr. Marla Roe to update her of current status of patient.  We will plan to have oncology input prior to surgical intervention on 04/30/2019.   Pictures were obtained of the patient and placed in the chart with the patient's or guardian's permission.  Electronically signed by: Carola Rhine Jaretzy Lhommedieu, PA-C 04/08/2019 10:51  AM

## 2019-04-08 NOTE — Progress Notes (Signed)
Patient ID: Kelli Logan, female    DOB: [birth year], 59 y.o.   MRN: [id number]  Chief Complaint  Patient presents with  . Pre-op Exam    for fat grafting and release of scar contracture to (L) breast      ICD-10-CM   1. Breast asymmetry following reconstructive surgery  N65.1   2. S/P breast reconstruction, left  Z98.890   3. Status post left mastectomy  Z90.12     History of Present Illness: Kelli Logan is a 59 y.o.  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  She also underwent right breast reduction/mastopexy for symmetry.  She has some left breast contracture along the medial aspect of her left breast and some excess tissue laterally causing discomfort and asymmetry.  Patient underwent left mastectomy with sentinel lymph node biopsy, immediate reconstruction of left side on 04/08/2018. Patient underwent removal of left tissue expander and placement of implant, right mastopexy on 08/04/2018 by Dr. Marla Roe.  She had placement of a [id number] cc Mentor silicone implant.  She presents for preoperative evaluation for upcoming procedure, fat grafting to left breast, release of scar contracture of left breast, scheduled for 04/30/2019 with Dr. Marla Roe.  The patient has not had problems with anesthesia.  No personal or family history of DVT/PE.  No history of CVA/MI.  No history of any bleeding or clotting disorders.  Not taking any blood thinners.  Quit smoking a few years ago.  Patient has a past medical history of hypothyroidism, hypertension, fibromyalgia, depression/anxiety, GERD.  She also reports that she has OSA, believes it is due to weight gain after starting hormone replacement therapy.  She is planning to have this evaluated, but reports she would like to wait until after surgery.  Her  mother recently passed away and she is dealing with her loss at this time.   She has a surgical history of appendectomy, left breast mastectomy, left breast reconstruction, umbilical hernia repair, right breast mastopexy.  Past Medical History: Allergies: Allergies  Allergen Reactions  . Tape Other (See Comments)    Paper tape after breast surgery/Burned skin   Tegaderm and other tape OK    Current Medications:  Current Outpatient Medications:  .  atorvastatin (LIPITOR) 40 MG tablet, Take 40 mg by mouth at bedtime. , Disp: , Rfl:  .  BIOTIN PO, Take 100 mcg by mouth daily., Disp: , Rfl:  .  busPIRone (BUSPAR) 15 MG tablet, TAKE 1 TABLET (15 MG TOTAL) BY MOUTH 3 (THREE) TIMES DAILY., Disp: 270 tablet, Rfl: 1 .  Calcium-Magnesium-Zinc (CAL-MAG-ZINC PO), Take 1 tablet by mouth daily in the afternoon., Disp: , Rfl:  .  Chromium Picolinate (CHROMIUM PICOLATE PO), Take 1,000 mcg by mouth daily., Disp: , Rfl:  .  citalopram (CELEXA) 40 MG tablet, Take 1 tablet (40 mg total) by mouth every morning., Disp: 90 tablet, Rfl: 0 .  Coenzyme Q10 (COQ10) 100 MG CAPS, Take 100 mg by mouth every evening., Disp: , Rfl:  .  cyclobenzaprine (FLEXERIL) 10 MG tablet, 1/2-1 po bid prn, Disp: , Rfl:  .  diclofenac (VOLTAREN) 75 MG EC tablet, twice daily with food for 1 month, then decrease to 75 mg once daily for 1 month, then discontinue. No over the counter pain relievers except Tylenol take with food, Disp: , Rfl:  .  doxazosin (CARDURA) 1 MG tablet, Take 1 tablet (1 mg total) by mouth 2 (two) times daily., Disp: 180 tablet, Rfl: 3 .  Flaxseed, Linseed, (FLAXSEED OIL PO), Take 1 capsule by mouth daily., Disp: , Rfl:  .  gabapentin (NEURONTIN) 600 MG tablet, Take 1 tablet (600 mg total) by mouth as directed. TAKE HALF TABLET IN THE AM AND HALF TABLET AT LUNCH AND 1 TABLET( 600 MG ) AT BEDTIME, Disp: 180 tablet, Rfl: 0 .  GLUCOSAMINE-CHONDROITIN PO, Take 2 tablets by mouth daily. 1500-1200, Disp: , Rfl:  .   hydrochlorothiazide (HYDRODIURIL) 25 MG tablet, Take 25 mg by mouth daily. , Disp: , Rfl:  .  hydrOXYzine (VISTARIL) 50 MG capsule, Take 1 capsule (50 mg total) by mouth 2 (two) times daily as needed. For anxiety attacks, Disp: 180 capsule, Rfl: 0 .  levothyroxine (SYNTHROID, LEVOTHROID) 50 MCG tablet, Take 50 mcg by mouth every other day. , Disp: , Rfl:  .  levothyroxine (SYNTHROID, LEVOTHROID) 75 MCG tablet, Take 75 mcg by mouth every other day., Disp: , Rfl:  .  losartan (COZAAR) 100 MG tablet, Take 100 mg by mouth daily. for high blood pressure, Disp: , Rfl:  .  metoprolol (LOPRESSOR) 50 MG tablet, Take 1 tablet (50 mg total) by mouth 2 (two) times daily., Disp: 60 tablet, Rfl: 0 .  Multiple Vitamin (MULTIVITAMIN WITH MINERALS) TABS tablet, Take 1 tablet by mouth daily., Disp: , Rfl:  .  naproxen (NAPROSYN) 500 MG tablet, Take 500 mg by mouth 2 (two) times daily as needed for moderate pain., Disp: , Rfl:  .  Omega-3 Fatty Acids (FISH OIL PO), Take 1 capsule by mouth daily., Disp: , Rfl:  .  omeprazole (PRILOSEC) 20 MG capsule, Take 20 mg by mouth daily. , Disp: , Rfl:  .  Probiotic Product (PROBIOTIC DAILY PO), Take 2 capsules by mouth daily., Disp: , Rfl:  .  QUEtiapine (SEROQUEL) 50 MG tablet, Take 3 tablets (150 mg total) by mouth at bedtime. PRESCRIBED BY NEUROLOGY (Patient taking differently: Take 50 mg by mouth at bedtime. PRESCRIBED BY NEUROLOGY), Disp: 90 tablet, Rfl: 0  Past Medical Problems: Past Medical History:  Diagnosis Date  . Anxiety   . Arthritis   . Cancer [facility])    breast left  . Depression   . Fibromyalgia   . GERD (gastroesophageal reflux disease)   . Headache   . History of kidney stones    h/o  . Hypertension   . Hypothyroidism   . Thyroid disease     Past Surgical History: Past Surgical History:  Procedure Laterality Date  . APPENDECTOMY    . BREAST BIOPSY Left few yrs ago   stereotactic bx in [place], benign  . BREAST BIOPSY Left 12/26/2017   Affirm Bx  [facility]- X-Clip  . BREAST BIOPSY Left 01/14/2018   Affirm Bx- Coil clip- path pending  . BREAST RECONSTRUCTION WITH PLACEMENT OF TISSUE EXPANDER AND ALLODERM Left 04/08/2018   Procedure: LEFT BREAST IMMEDIATE  RECONSTRUCTION WITH EXPANDER AND FLEX HD;  Surgeon: Wallace Going, DO;  Location: [facility] ORS;  Service: Plastics;  Laterality: Left;  . COLONOSCOPY WITH PROPOFOL N/A 06/26/2018   Procedure: COLONOSCOPY WITH PROPOFOL;  Surgeon: Virgel Manifold, MD;  Location: [facility];  Service: Endoscopy;  Laterality: N/A;  . ESOPHAGOGASTRODUODENOSCOPY (EGD) WITH PROPOFOL N/A 06/26/2018   Procedure: ESOPHAGOGASTRODUODENOSCOPY (EGD) WITH PROPOFOL;  Surgeon: Virgel Manifold, MD;  Location: [facility] END[facility];  Service: Endoscopy;  Laterality: N/A;  . EYE SURGERY Bilateral  lasik  . FOOT BONE EXCISION    . IMAGE GUIDED SINUS SURGERY  10/220  . MASTECTOMY    . MASTECTOMY W/ SENTINEL NODE BIOPSY Left 04/08/2018   Procedure: MASTECTOMY WITH SENTINEL LYMPH NODE BIOPSY LEFT;  Surgeon: Fredirick Maudlin, MD;  Location: [facility] OR[facility];  Service: General;  Laterality: Left;  Marland Kitchen MASTOPEXY Right 08/04/2018   Procedure: MASTOPEXY;  Surgeon: Wallace Going, DO;  Location: [facility] OR[facility];  Service: Plastics;  Laterality: Right;  TOTAL CASE TIME SHOULD BE 3 HOURS, PLEASE  . REMOVAL OF TISSUE EXPANDER AND PLACEMENT OF IMPLANT Left 08/04/2018   Procedure: REMOVAL OF TISSUE EXPANDER AND PLACEMENT OF IMPLANT;  Surgeon: Wallace Going, DO;  Location: [facility] OR[facility];  Service: Plastics;  Laterality: Left;  . UMBILICAL HERNIA REPAIR  1983 and 1985    Social History: Social History   Socioeconomic History  . Marital status: Divorced    Spouse name: Not on file  . Number of children: 1  . Years of education: Not on file  . Highest education level: High school graduate  Occupational History  . Not on file  Tobacco Use  . Smoking status: Former Smoker    Packs/day: 1.00    Years: 2.00    Pack years: 2.00    Types:  Cigarettes    Quit date: 01/06/2017    Years since quitting: 2.2  . Smokeless tobacco: Never Used  Substance and Sexual Activity  . Alcohol use: Yes    Comment: rare beer   . Drug use: Not Currently  . Sexual activity: Not on file  Other Topics Concern  . 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 Quit smoking; ocassional alcohol.    Social Determinants of Health   Financial Resource Strain:   . Difficulty of Paying Living Expenses:   Food Insecurity:   . Worried About Charity fundraiser in the Last Year:   . Arboriculturist in the Last Year:   Transportation Needs:   . Film/video editor (Medical):   Marland Kitchen Lack of Transportation (Non-Medical):   Physical Activity:   . Days of Exercise per Week:   . Minutes of Exercise per Session:   Stress:   . Feeling of Stress :   Social Connections:   . Frequency of Communication with Friends and Family:   . Frequency of Social Gatherings with Friends and Family:   . Attends Religious Services:   . Active Member of Clubs or Organizations:   . Attends Archivist Meetings:   Marland Kitchen Marital Status:   Intimate Partner Violence:   . Fear of Current or Ex-Partner:   . Emotionally Abused:   Marland Kitchen Physically Abused:   . Sexually Abused:     Family History: Family History  Problem Relation Age of Onset  . Hypertension Mother   . Lung cancer Maternal Grandmother   . Heart disease Father   . Alcohol abuse Father   . Breast cancer Neg Hx   . Colon cancer Neg Hx     Review of Systems: Review of Systems  Constitutional: Negative.   Respiratory: Negative.   Cardiovascular: Negative.   Gastrointestinal: Negative.   Genitourinary: Negative.   Musculoskeletal: Positive for myalgias.  Skin: Negative.   Neurological: Negative.     Physical Exam: Vital Signs BP (!) 141/88 (BP Location: Right Arm, Patient  Position: Sitting, Cuff Size: Normal)  Pulse 74   Temp (!) 96.6 F (35.9 C) (Temporal)   Ht 5\' 6"  (1.676 m)   Wt 196 lb 3.2 oz (89 kg)   SpO2 99%   BMI 31.67 kg/m   Physical Exam Nursing note reviewed. Exam conducted with a chaperone present.  Constitutional:      General: She is not in acute distress.    Appearance: Normal appearance. She is not ill-appearing.  HENT:     Head: Normocephalic and atraumatic.  Cardiovascular:     Rate and Rhythm: Normal rate and regular rhythm.     Pulses: Normal pulses.     Heart sounds: Normal heart sounds.  Pulmonary:     Effort: Pulmonary effort is normal.     Breath sounds: Normal breath sounds.  Chest:    Abdominal:     General: Abdomen is flat. There is no distension.     Palpations: Abdomen is soft.     Tenderness: There is no abdominal tenderness.  Musculoskeletal:        General: No swelling or tenderness. Normal range of motion.     Cervical back: Normal range of motion and neck supple.  Skin:    General: Skin is warm and dry.  Neurological:     General: No focal deficit present.     Mental Status: She is alert and oriented to person, place, and time. Mental status is at baseline.  Psychiatric:        Mood and Affect: Mood normal.        Behavior: Behavior normal.    Assessment/Plan: The patient is scheduled for fat grafting to left breast, release of scar contracture of left breast with Dr. Marla Roe on 04/30/19.  Risks, benefits, and alternatives of procedure discussed, questions answered and consent obtained.    Mammogram: 02/05/2018, status post left breast partial mastectomy, nipple sparing.  May need further evaluation prior to surgery.  Smoking status: Quit smoking a few years ago. Caprini score: High, 6, recommend mechanical prophylaxis, early ambulation, possible chemoprophylaxis on day of surgery and postoperatively.  Patient with a history of cancer, surgery time greater than 45 minutes, BMI greater than  25.  Patient provided with general consent form covering surgical risks associated with surgical intervention. Patient was allocated adequate time prior to appointment to read through, initial and sign that they agree that they are aware of the risks associated. We also covered the risks in person during this pre-op appointment. I answered all of the patient's questions to their content and informed them to call with any further questions or concerns prior to surgery and I would be happy to discuss with them further.   The risks that can be encountered with and after liposuction were discussed and include the following but no limited to these:  Asymmetry, fluid accumulation, firmness of the area, fat necrosis with death of fat tissue, bleeding, infection, delayed healing, anesthesia risks, skin sensation changes, injury to structures including nerves, blood vessels, and muscles which may be temporary or permanent, allergies to tape, suture materials and glues, blood products, topical preparations or injected agents, skin and contour irregularities, skin discoloration and swelling, deep vein thrombosis, cardiac and pulmonary complications, pain, which may persist, persistent pain, recurrence of the lesion, poor healing of the incision, possible need for revisional surgery or staged procedures. Thiere can also be persistent swelling, poor wound healing, rippling or loose skin, worsening of cellulite, swelling. Any change in weight fluctuations can alter the outcome.  Reached out to patient's  oncologist team to determine plan for further imaging/evaluation of patient's left breast.  Also consulted with Dr. Marla Roe to update her of current status of patient.  We will plan to have oncology input prior to surgical intervention on 04/30/2019.   Pictures were obtained of the patient and placed in the chart with the patient's or guardian's permission.  Electronically signed by: Carola Rhine Hawraa Stambaugh, PA-C 04/08/2019  10:51 AM

## 2019-04-08 NOTE — Progress Notes (Signed)
Order for [place] of left breast to evaluate mass medial to NAC. Informed patient.

## 2019-04-09 ENCOUNTER — Telehealth: Payer: Self-pay | Admitting: Internal Medicine

## 2019-04-09 NOTE — Telephone Encounter (Signed)
Spoke on 3/31- Dr.Dillingham- re: lump on left breast- fat necrosis vs. ? Recurrence. Dr.Dillingham to order [place].  Please schedule appt with me in 1 week; no labs.  GB

## 2019-04-13 ENCOUNTER — Ambulatory Visit: Payer: Medicare Other | Admitting: Licensed Clinical Social Worker

## 2019-04-16 ENCOUNTER — Ambulatory Visit: Payer: Medicare Other | Admitting: Internal Medicine

## 2019-04-16 ENCOUNTER — Other Ambulatory Visit: Payer: Self-pay | Admitting: Surgical

## 2019-04-16 DIAGNOSIS — Z9012 Acquired absence of left breast and nipple: Secondary | ICD-10-CM

## 2019-04-17 ENCOUNTER — Inpatient Hospital Stay: Payer: Medicare Other | Admitting: Internal Medicine

## 2019-04-21 ENCOUNTER — Ambulatory Visit
Admission: RE | Admit: 2019-04-21 | Discharge: 2019-04-21 | Disposition: A | Discharge status: Home / Self Care | Payer: Medicare Other | Source: Ambulatory Visit | Attending: Surgical | Admitting: Surgical

## 2019-04-21 DIAGNOSIS — Z9012 Acquired absence of left breast and nipple: Secondary | ICD-10-CM | POA: Diagnosis not present

## 2019-04-22 ENCOUNTER — Encounter (HOSPITAL_BASED_OUTPATIENT_CLINIC_OR_DEPARTMENT_OTHER): Payer: Self-pay | Admitting: Plastic Surgery

## 2019-04-22 ENCOUNTER — Inpatient Hospital Stay: Payer: Medicare Other | Attending: Internal Medicine | Admitting: Internal Medicine

## 2019-04-22 ENCOUNTER — Other Ambulatory Visit: Payer: Self-pay

## 2019-04-22 ENCOUNTER — Encounter: Payer: Self-pay | Admitting: Internal Medicine

## 2019-04-22 DIAGNOSIS — Z801 Family history of malignant neoplasm of trachea, bronchus and lung: Secondary | ICD-10-CM | POA: Insufficient documentation

## 2019-04-22 DIAGNOSIS — Z79899 Other long term (current) drug therapy: Secondary | ICD-10-CM | POA: Diagnosis not present

## 2019-04-22 DIAGNOSIS — Z87442 Personal history of urinary calculi: Secondary | ICD-10-CM | POA: Diagnosis not present

## 2019-04-22 DIAGNOSIS — Z9012 Acquired absence of left breast and nipple: Secondary | ICD-10-CM | POA: Diagnosis not present

## 2019-04-22 DIAGNOSIS — F419 Anxiety disorder, unspecified: Secondary | ICD-10-CM | POA: Diagnosis not present

## 2019-04-22 DIAGNOSIS — Z87891 Personal history of nicotine dependence: Secondary | ICD-10-CM | POA: Insufficient documentation

## 2019-04-22 DIAGNOSIS — F329 Major depressive disorder, single episode, unspecified: Secondary | ICD-10-CM | POA: Insufficient documentation

## 2019-04-22 DIAGNOSIS — I10 Essential (primary) hypertension: Secondary | ICD-10-CM | POA: Insufficient documentation

## 2019-04-22 DIAGNOSIS — Z791 Long term (current) use of non-steroidal anti-inflammatories (NSAID): Secondary | ICD-10-CM | POA: Diagnosis not present

## 2019-04-22 DIAGNOSIS — Z170 Estrogen receptor positive status [ER+]: Secondary | ICD-10-CM | POA: Insufficient documentation

## 2019-04-22 DIAGNOSIS — Z79811 Long term (current) use of aromatase inhibitors: Secondary | ICD-10-CM | POA: Insufficient documentation

## 2019-04-22 DIAGNOSIS — Z8249 Family history of ischemic heart disease and other diseases of the circulatory system: Secondary | ICD-10-CM | POA: Insufficient documentation

## 2019-04-22 DIAGNOSIS — K219 Gastro-esophageal reflux disease without esophagitis: Secondary | ICD-10-CM | POA: Insufficient documentation

## 2019-04-22 DIAGNOSIS — E039 Hypothyroidism, unspecified: Secondary | ICD-10-CM | POA: Diagnosis not present

## 2019-04-22 DIAGNOSIS — C50512 Malignant neoplasm of lower-outer quadrant of left female breast: Secondary | ICD-10-CM | POA: Diagnosis not present

## 2019-04-22 NOTE — Assessment & Plan Note (Addendum)
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  LOW risk-Oncotype.  Currently off letrozole- because of severe intolerance.  [severe fatigue/joint pains hot flashes/post coital pain].  Stable.  #Left mastectomy-no clinical exam of any recurrent tumor.  Ultrasound negative for malignancy.  Recommend clinical follow-up.  Proceed with plastic surgery as planned.   # DISPOSITION:  # follow up in 6 month- MD; no labs--Dr.B

## 2019-04-22 NOTE — Progress Notes (Signed)
[facility] CONSULT NOTE  Patient Care Team: Kelli Pac, FNP as PCP - General (Family Medicine)  CHIEF COMPLAINTS/PURPOSE OF CONSULTATION: Breast cancer  #  Oncology History Overview Note  # DEC 2019/MARCH 2020- LEFT BREAST IMC ER-Pos [>90%]; PR Neg; Her 2 NEG; 96m; sN0 [pT1b sN0]; stage I; high grade DCIS with comedonecrosis/ calcifications- 5 x5.4cm. s/p mastectomy [Dr.Cannon/Dillingham];   # Oncotype-RS- 20; Distant recurrence-risk of recurrence 6%.  No adjuvant chemo.  No radiation.  #May 09, 2018-letrozole [LMP- 2018] x discontinued because of severe intolerance [severe fatigue/arthralgias/postcoital pain]- after 1 month.   # anxiety/depression  DIAGNOSIS: Right Breast ca  STAGE:  I; GOALS: cure  CURRENT/MOST RECENT THERAPY: Femara     Malignant neoplasm of lower-outer quadrant of left breast of female, estrogen receptor positive (HCC)     HISTORY OF PRESENTING ILLNESS:  [name] 59y.o.  female history of left breast cancer ER PR positive HER-2 negative stage I status post mastectomy is here for follow-up.  Patient is currently off letrozole because of severe intolerance.  Patient was recently evaluated by plastic surgery felt to have a lump in the left reconstructed breast.  Please review the results of the ultrasound.  Patient continues to have multiple nonspecific complaints-fatigue; joint pains tingling and numbness.  She unfortunately lost her mother to Covid infection last progress.  Review of Systems  Constitutional: Positive for malaise/fatigue. Negative for chills, diaphoresis, fever and weight loss.  HENT: Negative for nosebleeds and sore throat.   Eyes: Negative for double vision.  Respiratory: Negative for cough, hemoptysis, sputum production, shortness of breath and wheezing.   Cardiovascular: Negative for chest pain, palpitations, orthopnea and leg swelling.  Gastrointestinal: Negative for abdominal pain, blood in stool,  constipation, diarrhea, heartburn, melena, nausea and vomiting.  Genitourinary: Negative for dysuria, frequency and urgency.  Musculoskeletal: Positive for back pain and joint pain.  Skin: Negative.  Negative for itching and rash.  Neurological: Negative for dizziness, tingling, focal weakness, weakness and headaches.  Endo/Heme/Allergies: Does not bruise/bleed easily.  Psychiatric/Behavioral: Negative for depression. The patient is nervous/anxious. The patient does not have insomnia.      MEDICAL HISTORY:  Past Medical History:  Diagnosis Date  . Anxiety   . Arthritis   . Cancer ([facility]    breast left  . Depression   . Fibromyalgia   . GERD (gastroesophageal reflux disease)   . Headache   . History of kidney stones    h/o  . Hypertension   . Hypothyroidism   . Thyroid disease     SURGICAL HISTORY: Past Surgical History:  Procedure Laterality Date  . APPENDECTOMY    . BREAST BIOPSY Left few yrs ago   [place]  . BREAST BIOPSY Left 12/26/2017   Affirm Bx IMC- X-Clip  . BREAST BIOPSY Left 01/14/2018   Affirm Bx- Coil clip- path pending  . BREAST RECONSTRUCTION WITH PLACEMENT OF TISSUE EXPANDER AND ALLODERM Left 04/08/2018   Procedure: LEFT BREAST IMMEDIATE  RECONSTRUCTION WITH EXPANDER AND FLEX HD;  Surgeon: DWallace Going DO;  Location: [facility] ORS;  Service: Plastics;  Laterality: Left;  . COLONOSCOPY WITH PROPOFOL N/A 06/26/2018   Procedure: COLONOSCOPY WITH PROPOFOL;  Surgeon: TVirgel Manifold MD;  Location: [facility];  Service: Endoscopy;  Laterality: N/A;  . ESOPHAGOGASTRODUODENOSCOPY (EGD) WITH PROPOFOL N/A 06/26/2018   Procedure: ESOPHAGOGASTRODUODENOSCOPY (EGD) WITH PROPOFOL;  Surgeon: TVirgel Manifold MD;  Location: [facility] END[facility];  Service: Endoscopy;  Laterality: N/A;  . EYE  SURGERY Bilateral    lasik  . FOOT BONE EXCISION    . IMAGE GUIDED SINUS SURGERY  10/220  . MASTECTOMY    . MASTECTOMY W/ SENTINEL NODE BIOPSY Left  04/08/2018   Procedure: MASTECTOMY WITH SENTINEL LYMPH NODE BIOPSY LEFT;  Surgeon: Fredirick Maudlin, MD;  Location: [facility];  Service: General;  Laterality: Left;  Marland Kitchen MASTOPEXY Right 08/04/2018   Procedure: MASTOPEXY;  Surgeon: Wallace Going, DO;  Location: [facility];  Service: Plastics;  Laterality: Right;  TOTAL CASE TIME SHOULD BE 3 HOURS, PLEASE  . REMOVAL OF TISSUE EXPANDER AND PLACEMENT OF IMPLANT Left 08/04/2018   Procedure: REMOVAL OF TISSUE EXPANDER AND PLACEMENT OF IMPLANT;  Surgeon: Wallace Going, DO;  Location: [facility];  Service: Plastics;  Laterality: Left;  . UMBILICAL HERNIA REPAIR  1983 and 1985    SOCIAL HISTORY: Social History   Socioeconomic History  . Marital status: Divorced    Spouse name: Not on file  . Number of children: 1  . Years of education: Not on file  . Highest education level: High school graduate  Occupational History  . Not on file  Tobacco Use  . Smoking status: Former Smoker    Packs/day: 1.00    Years: 2.00    Pack years: 2.00    Types: Cigarettes    Quit date: 01/06/2017    Years since quitting: 2.2  . Smokeless tobacco: Never Used  Substance and Sexual Activity  . Alcohol use: Yes    Comment: rare beer   . Drug use: Not Currently  . Sexual activity: Not on file  Other Topics Concern  . 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 Quit smoking; ocassional alcohol.    Social Determinants of Health   Financial Resource Strain:   . Difficulty of Paying Living Expenses:   Food Insecurity:   . Worried About Charity fundraiser in the Last Year:   . Arboriculturist in the Last Year:   Transportation Needs:   . Film/video editor (Medical):   Marland Kitchen Lack of Transportation (Non-Medical):   Physical Activity:   . Days of Exercise per Week:   . Minutes of Exercise per Session:   Stress:   . Feeling of Stress  :   Social Connections:   . Frequency of Communication with Friends and Family:   . Frequency of Social Gatherings with Friends and Family:   . Attends Religious Services:   . Active Member of Clubs or Organizations:   . Attends Archivist Meetings:   Marland Kitchen Marital Status:   Intimate Partner Violence:   . Fear of Current or Ex-Partner:   . Emotionally Abused:   Marland Kitchen Physically Abused:   . Sexually Abused:     FAMILY HISTORY: Family History  Problem Relation Age of Onset  . Hypertension Mother   . Lung cancer Maternal Grandmother   . Heart disease Father   . Alcohol abuse Father   . Breast cancer Neg Hx   . Colon cancer Neg Hx     ALLERGIES:  is allergic to tape.  MEDICATIONS:  Current Outpatient Medications  Medication Sig Dispense Refill  . atorvastatin (LIPITOR) 40 MG tablet Take 40 mg by mouth at bedtime.     Marland Kitchen BIOTIN PO Take 100 mcg by mouth daily.    . busPIRone (BUSPAR) 15 MG tablet TAKE 1 TABLET (  15 MG TOTAL) BY MOUTH 3 (THREE) TIMES DAILY. 270 tablet 1  . Calcium-Magnesium-Zinc (CAL-MAG-ZINC PO) Take 1 tablet by mouth daily in the afternoon.    . Chromium Picolinate (CHROMIUM PICOLATE PO) Take 1,000 mcg by mouth daily.    . citalopram (CELEXA) 40 MG tablet Take 1 tablet (40 mg total) by mouth every morning. 90 tablet 0  . CLONAZEPAM PO Take by mouth.    . Coenzyme Q10 (COQ10) 100 MG CAPS Take 100 mg by mouth every evening.    . cyclobenzaprine (FLEXERIL) 10 MG tablet 1/2-1 po bid prn    . diclofenac (VOLTAREN) 75 MG EC tablet twice daily with food for 1 month, then decrease to 75 mg once daily for 1 month, then discontinue. No over the counter pain relievers except Tylenol take with food    . doxazosin (CARDURA) 1 MG tablet Take 1 tablet (1 mg total) by mouth 2 (two) times daily. 180 tablet 3  . Flaxseed, Linseed, (FLAXSEED OIL PO) Take 1 capsule by mouth daily.    Marland Kitchen gabapentin (NEURONTIN) 600 MG tablet Take 1 tablet (600 mg total) by mouth as directed. TAKE HALF  TABLET IN THE AM AND HALF TABLET AT LUNCH AND 1 TABLET( 600 MG ) AT BEDTIME 180 tablet 0  . GLUCOSAMINE-CHONDROITIN PO Take 2 tablets by mouth daily. 1500-1200    . hydrochlorothiazide (HYDRODIURIL) 25 MG tablet Take 25 mg by mouth daily.     . hydrOXYzine (VISTARIL) 50 MG capsule Take 1 capsule (50 mg total) by mouth 2 (two) times daily as needed. For anxiety attacks 180 capsule 0  . levothyroxine (SYNTHROID, LEVOTHROID) 50 MCG tablet Take 50 mcg by mouth every other day.     . losartan (COZAAR) 100 MG tablet Take 100 mg by mouth daily. for high blood pressure    . metoprolol (LOPRESSOR) 50 MG tablet Take 1 tablet (50 mg total) by mouth 2 (two) times daily. 60 tablet 0  . Multiple Vitamin (MULTIVITAMIN WITH MINERALS) TABS tablet Take 1 tablet by mouth daily.    . naproxen (NAPROSYN) 500 MG tablet Take 500 mg by mouth 2 (two) times daily as needed for moderate pain.    . Omega-3 Fatty Acids (FISH OIL PO) Take 1 capsule by mouth daily.    Marland Kitchen omeprazole (PRILOSEC) 20 MG capsule Take 20 mg by mouth daily.     . Probiotic Product (PROBIOTIC DAILY PO) Take 2 capsules by mouth daily.    . QUEtiapine (SEROQUEL) 50 MG tablet Take 3 tablets (150 mg total) by mouth at bedtime. PRESCRIBED BY NEUROLOGY (Patient taking differently: Take 50 mg by mouth at bedtime. PRESCRIBED BY NEUROLOGY) 90 tablet 0   No current facility-administered medications for this visit.      Marland Kitchen  PHYSICAL EXAMINATION: ECOG PERFORMANCE STATUS: 0 - Asymptomatic  Vitals:   04/22/19 1320  BP: 113/74  Pulse: 75  Resp: 18  Temp: 98.9 F (37.2 C)   Filed Weights   04/22/19 1320  Weight: 195 lb 6.4 oz (88.6 kg)    Physical Exam  Constitutional: She is oriented to person, place, and time and well-developed, well-nourished, and in no distress.  HENT:  Head: Normocephalic and atraumatic.  Mouth/Throat: Oropharynx is clear and moist. No oropharyngeal exudate.  Eyes: Pupils are equal, round, and reactive to light.   Cardiovascular: Normal rate and regular rhythm.  Pulmonary/Chest: Effort normal and breath sounds normal. No respiratory distress. She has no wheezes.  Abdominal: Soft. Bowel sounds are normal. She exhibits  no distension and no mass. There is no abdominal tenderness. There is no rebound and no guarding.  Musculoskeletal:        General: No tenderness or edema. Normal range of motion.     Cervical back: Normal range of motion and neck supple.  Neurological: She is alert and oriented to person, place, and time.  Skin: Skin is warm.  Right and left BREAST exam (in the presence of nurse)-left mastectomy/reconstruction noted.  No unusual masses or lumps.  Right breast- no unusual skin changes or dominant masses felt.    Psychiatric: Affect normal.     LABORATORY DATA:  I have reviewed the data as listed Lab Results  Component Value Date   WBC 9.5 04/12/2018   HGB 11.6 (L) 04/12/2018   HCT 34.6 (L) 04/12/2018   MCV 84.4 04/12/2018   PLT 367 04/12/2018   Recent Labs    07/31/18 1032  NA 134*  K 3.5  CL 95*  CO2 29  GLUCOSE 94  BUN 16  CREATININE 0.78  CALCIUM 9.4  GFRNONAA >60  GFRAA >60    RADIOGRAPHIC STUDIES: I have personally reviewed the radiological images as listed and agreed with the findings in the report. US BREAST LTD UNI LEFT INC AXILLA  Result Date: 04/21/2019 CLINICAL DATA:  Lump adjacent to the nipple areolar complex. Left mastectomy. EXAM: ULTRASOUND OF THE LEFT BREAST COMPARISON:  None FINDINGS: On physical exam, postoperative changes are seen. Targeted ultrasound is performed, showing no sonographic evidence of malignancy. IMPRESSION: No sonographic evidence of malignancy. RECOMMENDATION: Treatment of the patient's lump should be based on clinical and physical exam given lack of imaging findings. I have discussed the findings and recommendations with the patient. If applicable, a reminder letter will be sent to the patient regarding the next appointment.  BI-RADS CATEGORY  1: Negative. Electronically Signed   By: Dorise Bullion III M.D   On: 04/21/2019 12:05    ASSESSMENT & PLAN:   Malignant neoplasm of lower-outer quadrant of left breast of female, estrogen receptor positive ([facility]) # Stage I left breast IMC; pT1b [6 mm] ER-Pos; PR-neg; Her 2 neu status post mastectomy.  LOW risk-Oncotype.  Currently off letrozole- because of severe intolerance.  [severe fatigue/joint pains hot flashes/post coital pain].  Stable.  #Left mastectomy-no clinical exam of any recurrent tumor.  Ultrasound negative for malignancy.  Recommend clinical follow-up.  Proceed with plastic surgery as planned.   # DISPOSITION:  # follow up in 6 month- MD; no labs--Dr.B   All questions were answered. The patient knows to call the clinic with any problems, questions or concerns.    Cammie Sickle, MD 04/22/2019 2:48 PM

## 2019-04-22 NOTE — Progress Notes (Signed)
Pt in for follow up, saw surgeon yesterday, breast exam performed.  Pt to have some corrective plastic surgery to left breast.

## 2019-04-22 NOTE — Progress Notes (Signed)
RN Chaperoned provider with Breast Exam.   

## 2019-04-27 ENCOUNTER — Encounter (HOSPITAL_BASED_OUTPATIENT_CLINIC_OR_DEPARTMENT_OTHER)
Admission: RE | Admit: 2019-04-27 | Discharge: 2019-04-27 | Disposition: A | Discharge status: Home / Self Care | Payer: Medicare Other | Source: Ambulatory Visit | Attending: Plastic Surgery | Admitting: Plastic Surgery

## 2019-04-27 ENCOUNTER — Other Ambulatory Visit (HOSPITAL_COMMUNITY)
Admission: RE | Admit: 2019-04-27 | Discharge: 2019-04-27 | Disposition: A | Discharge status: Home / Self Care | Payer: Medicare Other | Source: Ambulatory Visit | Attending: Plastic Surgery | Admitting: Plastic Surgery

## 2019-04-27 DIAGNOSIS — Z20822 Contact with and (suspected) exposure to covid-19: Secondary | ICD-10-CM | POA: Insufficient documentation

## 2019-04-27 DIAGNOSIS — Z01812 Encounter for preprocedural laboratory examination: Secondary | ICD-10-CM | POA: Insufficient documentation

## 2019-04-27 LAB — BASIC METABOLIC PANEL
Anion gap: 9 (ref 5–15)
BUN: 13 mg/dL (ref 6–20)
CO2: 26 mmol/L (ref 22–32)
Calcium: 9.4 mg/dL (ref 8.9–10.3)
Chloride: 99 mmol/L (ref 98–111)
Creatinine, Ser: 0.9 mg/dL (ref 0.44–1.00)
GFR calc Af Amer: >60 mL/min (ref >60)
GFR calc non Af Amer: >60 mL/min (ref >60)
Glucose, Bld: 102 mg/dL — ABNORMAL HIGH (ref 70–99)
Potassium: 4.2 mmol/L (ref 3.5–5.1)
Sodium: 134 mmol/L — ABNORMAL LOW (ref 135–145)

## 2019-04-27 LAB — SARS CORONAVIRUS 2 (TAT 6-24 HRS): SARS Coronavirus 2: NEGATIVE

## 2019-04-30 ENCOUNTER — Observation Stay (HOSPITAL_BASED_OUTPATIENT_CLINIC_OR_DEPARTMENT_OTHER)
Admission: RE | Admit: 2019-04-30 | Discharge: 2019-05-01 | Disposition: A | Discharge status: Home / Self Care | Payer: Medicare Other | Attending: Plastic Surgery | Admitting: Plastic Surgery

## 2019-04-30 ENCOUNTER — Ambulatory Visit (HOSPITAL_BASED_OUTPATIENT_CLINIC_OR_DEPARTMENT_OTHER): Payer: Medicare Other | Admitting: Anesthesiology

## 2019-04-30 ENCOUNTER — Encounter (HOSPITAL_BASED_OUTPATIENT_CLINIC_OR_DEPARTMENT_OTHER)
Admission: RE | Disposition: A | Discharge status: Home / Self Care | Payer: Self-pay | Source: Home / Self Care | Attending: Plastic Surgery

## 2019-04-30 ENCOUNTER — Encounter (HOSPITAL_BASED_OUTPATIENT_CLINIC_OR_DEPARTMENT_OTHER): Payer: Self-pay | Admitting: Plastic Surgery

## 2019-04-30 DIAGNOSIS — Z79899 Other long term (current) drug therapy: Secondary | ICD-10-CM | POA: Diagnosis not present

## 2019-04-30 DIAGNOSIS — Z791 Long term (current) use of non-steroidal anti-inflammatories (NSAID): Secondary | ICD-10-CM | POA: Diagnosis not present

## 2019-04-30 DIAGNOSIS — N651 Disproportion of reconstructed breast: Secondary | ICD-10-CM

## 2019-04-30 DIAGNOSIS — Z888 Allergy status to other drugs, medicaments and biological substances status: Secondary | ICD-10-CM | POA: Diagnosis not present

## 2019-04-30 DIAGNOSIS — G4733 Obstructive sleep apnea (adult) (pediatric): Secondary | ICD-10-CM | POA: Diagnosis not present

## 2019-04-30 DIAGNOSIS — Y834 Other reconstructive surgery as the cause of abnormal reaction of the patient, or of later complication, without mention of misadventure at the time of the procedure: Secondary | ICD-10-CM | POA: Diagnosis not present

## 2019-04-30 DIAGNOSIS — Z79890 Hormone replacement therapy (postmenopausal): Secondary | ICD-10-CM | POA: Diagnosis not present

## 2019-04-30 DIAGNOSIS — Z9889 Other specified postprocedural states: Secondary | ICD-10-CM | POA: Diagnosis not present

## 2019-04-30 DIAGNOSIS — Z9012 Acquired absence of left breast and nipple: Secondary | ICD-10-CM

## 2019-04-30 DIAGNOSIS — M797 Fibromyalgia: Secondary | ICD-10-CM | POA: Diagnosis not present

## 2019-04-30 DIAGNOSIS — F419 Anxiety disorder, unspecified: Secondary | ICD-10-CM | POA: Insufficient documentation

## 2019-04-30 DIAGNOSIS — Z87891 Personal history of nicotine dependence: Secondary | ICD-10-CM | POA: Insufficient documentation

## 2019-04-30 DIAGNOSIS — E039 Hypothyroidism, unspecified: Secondary | ICD-10-CM | POA: Diagnosis not present

## 2019-04-30 DIAGNOSIS — K219 Gastro-esophageal reflux disease without esophagitis: Secondary | ICD-10-CM | POA: Insufficient documentation

## 2019-04-30 DIAGNOSIS — I10 Essential (primary) hypertension: Secondary | ICD-10-CM | POA: Diagnosis not present

## 2019-04-30 DIAGNOSIS — F329 Major depressive disorder, single episode, unspecified: Secondary | ICD-10-CM | POA: Diagnosis not present

## 2019-04-30 DIAGNOSIS — T8544XA Capsular contracture of breast implant, initial encounter: Secondary | ICD-10-CM | POA: Diagnosis not present

## 2019-04-30 HISTORY — PX: LIPOSUCTION WITH LIPOFILLING: SHX6436

## 2019-04-30 HISTORY — PX: SCAR REVISION: SHX5285

## 2019-04-30 SURGERY — LIPOSUCTION, WITH FAT TRANSFER
Anesthesia: General | Site: Breast | Laterality: Left

## 2019-04-30 MED ORDER — CEFAZOLIN SODIUM-DEXTROSE 2-4 GM/100ML-% IV SOLN
2.0000 g | Freq: Three times a day (TID) | INTRAVENOUS | Status: DC
  Administered 2019-04-30 – 2019-05-01 (×3): 2 g via INTRAVENOUS
  Filled 2019-04-30 (×3): qty 100

## 2019-04-30 MED ORDER — DOXAZOSIN MESYLATE 1 MG PO TABS
1.0000 mg | ORAL_TABLET | Freq: Every day | ORAL | Status: DC
  Administered 2019-04-30: 1 mg via ORAL
  Filled 2019-04-30 (×3): qty 1

## 2019-04-30 MED ORDER — PHENYLEPHRINE HCL-NACL 10-0.9 MG/250ML-% IV SOLN
INTRAVENOUS | Status: DC | PRN
  Administered 2019-04-30: 40 ug/min via INTRAVENOUS

## 2019-04-30 MED ORDER — ONDANSETRON HCL 4 MG/2ML IJ SOLN: 4.0000 mg | Freq: Four times a day (QID) | INTRAMUSCULAR | Status: DC | PRN

## 2019-04-30 MED ORDER — BUSPIRONE HCL 15 MG PO TABS
15.0000 mg | ORAL_TABLET | Freq: Two times a day (BID) | ORAL | Status: DC
  Administered 2019-04-30: 15 mg via ORAL
  Filled 2019-04-30 (×3): qty 1

## 2019-04-30 MED ORDER — KCL IN DEXTROSE-NACL 20-5-0.45 MEQ/L-%-% IV SOLN
INTRAVENOUS | Status: DC
  Filled 2019-04-30: qty 1000

## 2019-04-30 MED ORDER — FENTANYL CITRATE (PF) 100 MCG/2ML IJ SOLN
50.0000 ug | INTRAMUSCULAR | Status: DC | PRN
  Administered 2019-04-30: 100 ug via INTRAVENOUS

## 2019-04-30 MED ORDER — LIDOCAINE 2% (20 MG/ML) 5 ML SYRINGE
INTRAMUSCULAR | Status: AC
  Filled 2019-04-30: qty 5

## 2019-04-30 MED ORDER — LIDOCAINE-EPINEPHRINE 1 %-1:100000 IJ SOLN
INTRAMUSCULAR | Status: DC | PRN
  Administered 2019-04-30: 7 mL via INTRADERMAL

## 2019-04-30 MED ORDER — PROPOFOL 10 MG/ML IV BOLUS
INTRAVENOUS | Status: DC | PRN
  Administered 2019-04-30: 150 mg via INTRAVENOUS

## 2019-04-30 MED ORDER — OXYCODONE HCL 5 MG/5ML PO SOLN: 5.0000 mg | Freq: Once | ORAL | Status: DC | PRN

## 2019-04-30 MED ORDER — ONDANSETRON HCL 4 MG/2ML IJ SOLN
INTRAMUSCULAR | Status: AC
  Filled 2019-04-30: qty 2

## 2019-04-30 MED ORDER — GLYCOPYRROLATE 0.2 MG/ML IJ SOLN
INTRAMUSCULAR | Status: DC | PRN
  Administered 2019-04-30: .2 mg via INTRAVENOUS

## 2019-04-30 MED ORDER — SENNA 8.6 MG PO TABS
1.0000 | ORAL_TABLET | Freq: Two times a day (BID) | ORAL | Status: DC
  Administered 2019-04-30 – 2019-05-01 (×2): 8.6 mg via ORAL
  Filled 2019-04-30 (×3): qty 1

## 2019-04-30 MED ORDER — ACETAMINOPHEN 325 MG PO TABS
325.0000 mg | ORAL_TABLET | Freq: Four times a day (QID) | ORAL | Status: DC
  Administered 2019-04-30 – 2019-05-01 (×4): 325 mg via ORAL
  Filled 2019-04-30 (×4): qty 1

## 2019-04-30 MED ORDER — FENTANYL CITRATE (PF) 100 MCG/2ML IJ SOLN
INTRAMUSCULAR | Status: AC
  Filled 2019-04-30: qty 2

## 2019-04-30 MED ORDER — CEFAZOLIN SODIUM-DEXTROSE 2-4 GM/100ML-% IV SOLN
2.0000 g | INTRAVENOUS | Status: AC
  Administered 2019-04-30: 2 g via INTRAVENOUS

## 2019-04-30 MED ORDER — CHLORHEXIDINE GLUCONATE CLOTH 2 % EX PADS: 6.0000 | MEDICATED_PAD | Freq: Once | CUTANEOUS | Status: DC

## 2019-04-30 MED ORDER — MIDAZOLAM HCL 2 MG/2ML IJ SOLN
1.0000 mg | INTRAMUSCULAR | Status: DC | PRN
  Administered 2019-04-30: 08:00:00 2 mg via INTRAVENOUS

## 2019-04-30 MED ORDER — EPHEDRINE SULFATE 50 MG/ML IJ SOLN
INTRAMUSCULAR | Status: DC | PRN
  Administered 2019-04-30: 15 mg via INTRAVENOUS
  Administered 2019-04-30: 10 mg via INTRAVENOUS

## 2019-04-30 MED ORDER — SUCCINYLCHOLINE CHLORIDE 200 MG/10ML IV SOSY
PREFILLED_SYRINGE | INTRAVENOUS | Status: AC
  Filled 2019-04-30: qty 10

## 2019-04-30 MED ORDER — QUETIAPINE FUMARATE 100 MG PO TABS
100.0000 mg | ORAL_TABLET | Freq: Every day | ORAL | Status: DC
  Administered 2019-04-30: 100 mg via ORAL
  Filled 2019-04-30 (×3): qty 1

## 2019-04-30 MED ORDER — DIAZEPAM 2 MG PO TABS
2.0000 mg | ORAL_TABLET | Freq: Two times a day (BID) | ORAL | Status: DC | PRN
  Administered 2019-05-01: 2 mg via ORAL
  Filled 2019-04-30: qty 1

## 2019-04-30 MED ORDER — HYDROMORPHONE HCL 1 MG/ML IJ SOLN: 1.0000 mg | INTRAMUSCULAR | Status: DC | PRN

## 2019-04-30 MED ORDER — METOPROLOL TARTRATE 25 MG PO TABS
50.0000 mg | ORAL_TABLET | Freq: Every day | ORAL | Status: DC
  Administered 2019-04-30: 50 mg via ORAL
  Filled 2019-04-30 (×2): qty 2

## 2019-04-30 MED ORDER — FENTANYL CITRATE (PF) 100 MCG/2ML IJ SOLN
25.0000 ug | INTRAMUSCULAR | Status: DC | PRN
  Administered 2019-04-30: 50 ug via INTRAVENOUS
  Administered 2019-04-30: 25 ug via INTRAVENOUS

## 2019-04-30 MED ORDER — LACTATED RINGERS IV SOLN: INTRAVENOUS | Status: DC

## 2019-04-30 MED ORDER — EPINEPHRINE PF 1 MG/ML IJ SOLN
INTRAMUSCULAR | Status: AC
  Filled 2019-04-30: qty 1

## 2019-04-30 MED ORDER — MIDAZOLAM HCL 2 MG/2ML IJ SOLN
INTRAMUSCULAR | Status: AC
  Filled 2019-04-30: qty 2

## 2019-04-30 MED ORDER — LIDOCAINE HCL (PF) 1 % IJ SOLN
INTRAMUSCULAR | Status: AC
  Filled 2019-04-30: qty 30

## 2019-04-30 MED ORDER — PHENYLEPHRINE 40 MCG/ML (10ML) SYRINGE FOR IV PUSH (FOR BLOOD PRESSURE SUPPORT)
PREFILLED_SYRINGE | INTRAVENOUS | Status: AC
  Filled 2019-04-30: qty 10

## 2019-04-30 MED ORDER — PROPOFOL 500 MG/50ML IV EMUL
INTRAVENOUS | Status: AC
  Filled 2019-04-30: qty 50

## 2019-04-30 MED ORDER — CEFAZOLIN SODIUM-DEXTROSE 2-4 GM/100ML-% IV SOLN
INTRAVENOUS | Status: AC
  Filled 2019-04-30: qty 100

## 2019-04-30 MED ORDER — IBUPROFEN 200 MG PO TABS
400.0000 mg | ORAL_TABLET | Freq: Four times a day (QID) | ORAL | Status: DC
  Administered 2019-04-30 – 2019-05-01 (×4): 400 mg via ORAL
  Filled 2019-04-30 (×5): qty 2

## 2019-04-30 MED ORDER — PHENYLEPHRINE HCL (PRESSORS) 10 MG/ML IV SOLN
INTRAVENOUS | Status: AC
  Filled 2019-04-30: qty 1

## 2019-04-30 MED ORDER — PANTOPRAZOLE SODIUM 40 MG PO TBEC
40.0000 mg | DELAYED_RELEASE_TABLET | Freq: Every day | ORAL | Status: DC
  Administered 2019-04-30 – 2019-05-01 (×2): 40 mg via ORAL
  Filled 2019-04-30 (×2): qty 1

## 2019-04-30 MED ORDER — PROMETHAZINE HCL 25 MG/ML IJ SOLN: 6.2500 mg | INTRAMUSCULAR | Status: DC | PRN

## 2019-04-30 MED ORDER — LIDOCAINE-EPINEPHRINE 1 %-1:100000 IJ SOLN
INTRAMUSCULAR | Status: AC
  Filled 2019-04-30: qty 1

## 2019-04-30 MED ORDER — DIPHENHYDRAMINE HCL 12.5 MG/5ML PO ELIX: 12.5000 mg | ORAL_SOLUTION | Freq: Four times a day (QID) | ORAL | Status: DC | PRN

## 2019-04-30 MED ORDER — KETOROLAC TROMETHAMINE 30 MG/ML IJ SOLN
INTRAMUSCULAR | Status: AC
  Filled 2019-04-30: qty 1

## 2019-04-30 MED ORDER — LIDOCAINE HCL 1 % IJ SOLN
INTRAVENOUS | Status: DC | PRN
  Administered 2019-04-30: 550 mL

## 2019-04-30 MED ORDER — BUPIVACAINE HCL (PF) 0.25 % IJ SOLN
INTRAMUSCULAR | Status: AC
  Filled 2019-04-30: qty 30

## 2019-04-30 MED ORDER — OXYCODONE HCL 5 MG PO TABS
5.0000 mg | ORAL_TABLET | Freq: Once | ORAL | Status: DC | PRN
  Filled 2019-04-30: qty 1

## 2019-04-30 MED ORDER — EPHEDRINE 5 MG/ML INJ
INTRAVENOUS | Status: AC
  Filled 2019-04-30: qty 10

## 2019-04-30 MED ORDER — DIPHENHYDRAMINE HCL 50 MG/ML IJ SOLN: 12.5000 mg | Freq: Four times a day (QID) | INTRAMUSCULAR | Status: DC | PRN

## 2019-04-30 MED ORDER — DEXAMETHASONE SODIUM PHOSPHATE 10 MG/ML IJ SOLN
INTRAMUSCULAR | Status: AC
  Filled 2019-04-30: qty 1

## 2019-04-30 MED ORDER — GLYCOPYRROLATE PF 0.2 MG/ML IJ SOSY
PREFILLED_SYRINGE | INTRAMUSCULAR | Status: AC
  Filled 2019-04-30: qty 1

## 2019-04-30 MED ORDER — DIPHENHYDRAMINE HCL 50 MG/ML IJ SOLN
INTRAMUSCULAR | Status: AC
  Filled 2019-04-30: qty 1

## 2019-04-30 MED ORDER — LEVOTHYROXINE SODIUM 50 MCG PO TABS
50.0000 ug | ORAL_TABLET | Freq: Every day | ORAL | Status: DC
  Administered 2019-05-01: 50 ug via ORAL
  Filled 2019-04-30 (×3): qty 1

## 2019-04-30 MED ORDER — OXYCODONE HCL 5 MG PO TABS
5.0000 mg | ORAL_TABLET | ORAL | Status: DC | PRN
  Administered 2019-04-30 – 2019-05-01 (×2): 5 mg via ORAL
  Filled 2019-04-30: qty 2

## 2019-04-30 MED ORDER — DIPHENHYDRAMINE HCL 50 MG/ML IJ SOLN
INTRAMUSCULAR | Status: DC | PRN
  Administered 2019-04-30: 6.25 mg via INTRAVENOUS

## 2019-04-30 MED ORDER — ONDANSETRON 4 MG PO TBDP: 4.0000 mg | ORAL_TABLET | Freq: Four times a day (QID) | ORAL | Status: DC | PRN

## 2019-04-30 MED ORDER — KETOROLAC TROMETHAMINE 30 MG/ML IJ SOLN
30.0000 mg | Freq: Once | INTRAMUSCULAR | Status: AC | PRN
  Administered 2019-04-30: 30 mg via INTRAVENOUS

## 2019-04-30 MED ORDER — GABAPENTIN 100 MG PO CAPS
100.0000 mg | ORAL_CAPSULE | Freq: Two times a day (BID) | ORAL | Status: DC
  Administered 2019-04-30 – 2019-05-01 (×2): 100 mg via ORAL
  Filled 2019-04-30 (×2): qty 1

## 2019-04-30 MED ORDER — LOSARTAN POTASSIUM 50 MG PO TABS
100.0000 mg | ORAL_TABLET | Freq: Every day | ORAL | Status: DC
  Administered 2019-04-30: 100 mg via ORAL
  Filled 2019-04-30 (×3): qty 2

## 2019-04-30 SURGICAL SUPPLY — 94 items
BINDER ABDOMINAL  9 SM 30-45 (SOFTGOODS) ×1
BINDER ABDOMINAL 10 UNV 27-48 (MISCELLANEOUS) IMPLANT
BINDER ABDOMINAL 12 SM 30-45 (SOFTGOODS) IMPLANT
BINDER ABDOMINAL 9 SM 30-45 (SOFTGOODS) ×1 IMPLANT
BINDER BREAST LRG (GAUZE/BANDAGES/DRESSINGS) IMPLANT
BINDER BREAST MEDIUM (GAUZE/BANDAGES/DRESSINGS) IMPLANT
BINDER BREAST XLRG (GAUZE/BANDAGES/DRESSINGS) IMPLANT
BINDER BREAST XXLRG (GAUZE/BANDAGES/DRESSINGS) IMPLANT
BLADE CLIPPER SURG (BLADE) IMPLANT
BLADE HEX COATED 2.75 (ELECTRODE) ×2 IMPLANT
BLADE SURG 15 STRL LF DISP TIS (BLADE) ×2 IMPLANT
BLADE SURG 15 STRL SS (BLADE) ×2
BNDG CONFORM 2 STRL LF (GAUZE/BANDAGES/DRESSINGS) IMPLANT
BNDG ELASTIC 2X5.8 VLCR STR LF (GAUZE/BANDAGES/DRESSINGS) IMPLANT
BNDG GAUZE ELAST 4 BULKY (GAUZE/BANDAGES/DRESSINGS) ×4 IMPLANT
CANISTER SUCT 1200ML W/VALVE (MISCELLANEOUS) IMPLANT
CLSR STERI-STRIP ANTIMIC 1/2X4 (GAUZE/BANDAGES/DRESSINGS) IMPLANT
CORD BIPOLAR FORCEPS 12FT (ELECTRODE) IMPLANT
COVER BACK TABLE 60X90IN (DRAPES) ×2 IMPLANT
COVER MAYO STAND STRL (DRAPES) ×2 IMPLANT
COVER WAND RF STERILE (DRAPES) IMPLANT
DECANTER SPIKE VIAL GLASS SM (MISCELLANEOUS) IMPLANT
DERMABOND ADVANCED (GAUZE/BANDAGES/DRESSINGS) ×4
DERMABOND ADVANCED .7 DNX12 (GAUZE/BANDAGES/DRESSINGS) ×4 IMPLANT
DRAPE LAPAROSCOPIC ABDOMINAL (DRAPES) ×2 IMPLANT
DRAPE LAPAROTOMY 100X72 PEDS (DRAPES) IMPLANT
DRAPE U-SHAPE 76X120 STRL (DRAPES) IMPLANT
DRSG PAD ABDOMINAL 8X10 ST (GAUZE/BANDAGES/DRESSINGS) ×4 IMPLANT
ELECT COATED BLADE 2.86 ST (ELECTRODE) IMPLANT
ELECT NEEDLE BLADE 2-5/6 (NEEDLE) ×2 IMPLANT
ELECT REM PT RETURN 9FT ADLT (ELECTROSURGICAL) ×2
ELECT REM PT RETURN 9FT PED (ELECTROSURGICAL)
ELECTRODE REM PT RETRN 9FT PED (ELECTROSURGICAL) IMPLANT
ELECTRODE REM PT RTRN 9FT ADLT (ELECTROSURGICAL) ×1 IMPLANT
EXTRACTOR CANIST REVOLVE STRL (CANNISTER) ×2 IMPLANT
GAUZE SPONGE 4X4 12PLY STRL LF (GAUZE/BANDAGES/DRESSINGS) IMPLANT
GAUZE XEROFORM 1X8 LF (GAUZE/BANDAGES/DRESSINGS) IMPLANT
GLOVE BIO SURGEON STRL SZ 6.5 (GLOVE) ×4 IMPLANT
GLOVE BIOGEL M STRL SZ7.5 (GLOVE) ×2 IMPLANT
GLOVE BIOGEL PI IND STRL 6.5 (GLOVE) ×1 IMPLANT
GLOVE BIOGEL PI IND STRL 7.0 (GLOVE) ×1 IMPLANT
GLOVE BIOGEL PI INDICATOR 6.5 (GLOVE) ×1
GLOVE BIOGEL PI INDICATOR 7.0 (GLOVE) ×1
GLOVE SURG SYN 7.5  E (GLOVE) ×1
GLOVE SURG SYN 7.5 E (GLOVE) ×1 IMPLANT
GOWN STRL REUS W/ TWL LRG LVL3 (GOWN DISPOSABLE) ×2 IMPLANT
GOWN STRL REUS W/TWL LRG LVL3 (GOWN DISPOSABLE) ×2
IV LACTATED RINGERS 1000ML (IV SOLUTION) ×6 IMPLANT
LINER CANISTER 1000CC FLEX (MISCELLANEOUS) ×2 IMPLANT
NDL SAFETY ECLIPSE 18X1.5 (NEEDLE) ×1 IMPLANT
NEEDLE FILTER BLUNT 18X 1/2SAF (NEEDLE) ×1
NEEDLE FILTER BLUNT 18X1 1/2 (NEEDLE) ×1 IMPLANT
NEEDLE HYPO 18GX1.5 SHARP (NEEDLE) ×1
NEEDLE HYPO 25X1 1.5 SAFETY (NEEDLE) IMPLANT
NEEDLE HYPO 30GX1 BEV (NEEDLE) IMPLANT
NEEDLE PRECISIONGLIDE 27X1.5 (NEEDLE) IMPLANT
NS IRRIG 1000ML POUR BTL (IV SOLUTION) IMPLANT
PAD ALCOHOL SWAB (MISCELLANEOUS) ×2 IMPLANT
PENCIL SMOKE EVACUATOR (MISCELLANEOUS) ×2 IMPLANT
SET BASIN DAY SURGERY F.S. (CUSTOM PROCEDURE TRAY) ×2 IMPLANT
SHEET MEDIUM DRAPE 40X70 STRL (DRAPES) IMPLANT
SLEEVE SCD COMPRESS KNEE MED (MISCELLANEOUS) ×2 IMPLANT
SPONGE GAUZE 2X2 8PLY STRL LF (GAUZE/BANDAGES/DRESSINGS) IMPLANT
SPONGE LAP 18X18 RF (DISPOSABLE) ×4 IMPLANT
STRIP CLOSURE SKIN 1/2X4 (GAUZE/BANDAGES/DRESSINGS) IMPLANT
STRIP SUTURE WOUND CLOSURE 1/2 (MISCELLANEOUS) IMPLANT
SUCTION FRAZIER HANDLE 10FR (MISCELLANEOUS)
SUCTION TUBE FRAZIER 10FR DISP (MISCELLANEOUS) IMPLANT
SUT CHROMIC 4 0 P 3 18 (SUTURE) IMPLANT
SUT ETHILON 4 0 PS 2 18 (SUTURE) IMPLANT
SUT ETHILON 5 0 P 3 18 (SUTURE)
SUT MNCRL 6-0 UNDY P1 1X18 (SUTURE) IMPLANT
SUT MNCRL AB 4-0 PS2 18 (SUTURE) IMPLANT
SUT MON AB 5-0 P3 18 (SUTURE) IMPLANT
SUT MON AB 5-0 PS2 18 (SUTURE) ×4 IMPLANT
SUT MONOCRYL 6-0 P1 1X18 (SUTURE)
SUT NYLON ETHILON 5-0 P-3 1X18 (SUTURE) IMPLANT
SUT PLAIN 5 0 P 3 18 (SUTURE) IMPLANT
SUT VIC AB 5-0 P-3 18X BRD (SUTURE) IMPLANT
SUT VIC AB 5-0 P3 18 (SUTURE)
SUT VICRYL 4-0 PS2 18IN ABS (SUTURE) IMPLANT
SUT VICRYL 6 0 P 1 18 (SUTURE) IMPLANT
SYR 10ML LL (SYRINGE) ×8 IMPLANT
SYR 3ML 18GX1 1/2 (SYRINGE) IMPLANT
SYR 50ML LL SCALE MARK (SYRINGE) ×4 IMPLANT
SYR BULB EAR ULCER 3OZ GRN STR (SYRINGE) IMPLANT
SYR CONTROL 10ML LL (SYRINGE) ×2 IMPLANT
SYR TOOMEY 50ML (SYRINGE) IMPLANT
TOWEL GREEN STERILE FF (TOWEL DISPOSABLE) ×4 IMPLANT
TRAY DSU PREP LF (CUSTOM PROCEDURE TRAY) ×2 IMPLANT
TUBE CONNECTING 20X1/4 (TUBING) IMPLANT
TUBING INFILTRATION IT-10001 (TUBING) IMPLANT
TUBING SET GRADUATE ASPIR 12FT (MISCELLANEOUS) ×2 IMPLANT
UNDERPAD 30X36 HEAVY ABSORB (UNDERPADS AND DIAPERS) ×4 IMPLANT

## 2019-04-30 NOTE — Anesthesia Preprocedure Evaluation (Signed)
Anesthesia Evaluation  Patient identified by MRN, date of birth, ID band Patient awake    Reviewed: Allergy & Precautions, NPO status , Patient's Chart, lab work & pertinent test results  Airway Mallampati: II  TM Distance: >3 FB Neck ROM: Full    Dental no notable dental hx.    Pulmonary neg pulmonary ROS, former smoker,    Pulmonary exam normal breath sounds clear to auscultation       Cardiovascular hypertension, Normal cardiovascular exam Rhythm:Regular Rate:Normal     Neuro/Psych negative neurological ROS  negative psych ROS   GI/Hepatic Neg liver ROS, GERD  ,  Endo/Other  Hypothyroidism   Renal/GU negative Renal ROS  negative genitourinary   Musculoskeletal negative musculoskeletal ROS (+)   Abdominal   Peds negative pediatric ROS (+)  Hematology negative hematology ROS (+)   Anesthesia Other Findings   Reproductive/Obstetrics negative OB ROS                             Anesthesia Physical Anesthesia Plan  ASA: II  Anesthesia Plan: General   Post-op Pain Management:    Induction: Intravenous  PONV Risk Score and Plan: 3 and Ondansetron, Dexamethasone, Treatment may vary due to age or medical condition and Midazolam  Airway Management Planned: LMA  Additional Equipment:   Intra-op Plan:   Post-operative Plan: Extubation in OR  Informed Consent: I have reviewed the patients History and Physical, chart, labs and discussed the procedure including the risks, benefits and alternatives for the proposed anesthesia with the patient or authorized representative who has indicated his/her understanding and acceptance.     Dental advisory given  Plan Discussed with: CRNA and Surgeon  Anesthesia Plan Comments:         Anesthesia Quick Evaluation

## 2019-04-30 NOTE — Anesthesia Procedure Notes (Signed)
Procedure Name: LMA Insertion Date/Time: 04/30/2019 7:38 AM Performed by: Willa Frater, CRNA Pre-anesthesia Checklist: Patient identified, Emergency Drugs available, Suction available and Patient being monitored Patient Re-evaluated:Patient Re-evaluated prior to induction Oxygen Delivery Method: Circle system utilized Preoxygenation: Pre-oxygenation with 100% oxygen Induction Type: IV induction Ventilation: Mask ventilation without difficulty LMA: LMA inserted LMA Size: 4.0 Number of attempts: 1 Airway Equipment and Method: Bite block Placement Confirmation: positive ETCO2 Tube secured with: Tape Dental Injury: Teeth and Oropharynx as per pre-operative assessment

## 2019-04-30 NOTE — Op Note (Signed)
DATE OF OPERATION: 04/30/2019  LOCATION: [facility]  PREOPERTIVE DIAGNOSIS: Left breast asymmetry and capsule contracture  POSTOPERATIVE DIAGNOSIS: Same  PROCEDURE:  1. Release of left breast capsule contracture 2. Fat grafting to left breast (120 cc)  SURGEON: Yoana Staib Sanger Oryn Casanova, DO  ASSISTANT: Roetta Sessions, PA  EBL: 5 cc  CONDITION: Stable  COMPLICATIONS: None  INDICATION: The patient, Kelli Logan, is a 59 y.o. female born on 06-05-1960, is here for treatment of a left breast capsule contracture with asymmetry after reconstruction.l.   PROCEDURE DETAILS:  The patient was seen prior to surgery and marked.  The IV antibiotics were given. The patient was taken to the operating room and given a general anesthetic. A standard time out was performed and all information was confirmed by those in the room. SCDs were placed.    The patient was prepped with Betadine and draped in the usual sterile fashion.  Lidocaine with epinephrine was injected at the umbilical area in the lateral and medial breast area at the inframammary fold.  A #15 blade was used to make a 5 mm incision in each of the described places.  The tumescent was infused into the abdomen.  After waiting several minutes for the local to take effect liposuction began.  The donor fat was collected in the revolve device.  It was prepared according to manufacture guidelines.  It was rinsed and then collected in the 10 cc syringes.  The V shaped knife was inserted into the left breast under the flap but outside the pocket.  This was used to release and incise the capsular contracture that was on the medial superior aspect as well as around the nipple areola and at the inferior medial portion of the breast.  Once the fat was prepared it was injected into the medial superior breast, medial breast and medial inferior left breast (120 cc).  This provided much better symmetry.  Liposuction was then inserted into  the 5 mm incision at the inframammary fold laterally.  Liposuction was done in the axillary area to achieve better symmetry.  All incisions were closed with a 5-0 Monocryl. The patient was allowed to wake up and taken to recovery room in stable condition at the end of the case. The family was notified at the end of the case.   The advanced practice practitioner (APP) assisted throughout the case.  The APP was essential in retraction and counter traction when needed to make the case progress smoothly.  This retraction and assistance made it possible to see the tissue plans for the procedure.  The assistance was needed for blood control, tissue re-approximation and assisted with closure of the incision site.  The [place] was signed into law in 2016 which includes the topic of electronic health records.  This provides immediate access to information in MyChart.  This includes consultation notes, operative notes, office notes, lab results and pathology reports.  If you have any questions about what you read please let us know at your next visit or call us at the office.  We are right here with you.

## 2019-04-30 NOTE — Discharge Instructions (Signed)
INSTRUCTIONS FOR AFTER SURGERY   You will likely have some questions about what to expect following your operation.  The following information will help you and your family understand what to expect when you are discharged from the hospital.  Following these guidelines will help ensure a smooth recovery and reduce risks of complications.  Postoperative instructions include information on: diet, wound care, medications and physical activity.  AFTER SURGERY Expect to go home after the procedure.  In some cases, you may need to spend one night in the hospital for observation.  DIET This surgery does not require a specific diet.  However, I have to mention that the healthier you eat the better your body can start healing. It is important to increasing your protein intake.  This means limiting the foods with added sugar.  Focus on fruits and vegetables and some meat.  If you have any liposuction during your procedure be sure to drink water.  If your urine is bright yellow, then it is concentrated, and you need to drink more water.  As a general rule after surgery, you should have 8 ounces of water every hour while awake.  If you find you are persistently nauseated or unable to take in liquids let us know.  NO TOBACCO USE or EXPOSURE.  This will slow your healing process and increase the risk of a wound.  WOUND CARE If you don't have a drain: You can shower the day after surgery.  Use fragrance free soap.  Dial, Dove, Ivory and Cetaphil are usually mild on the skin.  If you have steri-strips / tape directly attached to your skin leave them in place. It is OK to get these wet.  No baths, pools or hot tubs for two weeks. We close your incision to leave the smallest and best-looking scar. No ointment or creams on your incisions until given the go ahead.  Especially not Neosporin (Too many skin reactions with this one).  A few weeks after surgery you can use Mederma and start massaging the scar. We ask you to  wear your binder or sports bra for the first 6 weeks around the clock, including while sleeping. This provides added comfort and helps reduce the fluid accumulation at the surgery site.  ACTIVITY No heavy lifting until cleared by the doctor.  It is OK to walk and climb stairs. In fact, moving your legs is very important to decrease your risk of a blood clot.  It will also help keep you from getting deconditioned.  Every 1 to 2 hours get up and walk for 5 minutes. This will help with a quicker recovery back to normal.  Let pain be your guide so you don't do too much.  NO, you cannot do the spring cleaning and don't plan on taking care of anyone else.  This is your time for TLC.   WORK Everyone returns to work at different times. As a rough guide, most people take at least 1 - 2 weeks off prior to returning to work. If you need documentation for your job, bring the forms to your postoperative follow up visit.  DRIVING Arrange for someone to bring you home from the hospital.  You may be able to drive a few days after surgery but not while taking any narcotics or valium.  BOWEL MOVEMENTS Constipation can occur after anesthesia and while taking pain medication.  It is important to stay ahead for your comfort.  We recommend taking Milk of Magnesia (2 tablespoons; twice   a day) while taking the pain pills.  SEROMA This is fluid your body tried to put in the surgical site.  This is normal but if it creates excessive pain and swelling let us know.  It usually decreases in a few weeks.  MEDICATIONS and PAIN CONTROL At your preoperative visit for you history and physical you were given the following medications: 1. An antibiotic: Start this medication when you get home and take according to the instructions on the bottle. 2. Zofran 4 mg:  This is to treat nausea and vomiting.  You can take this every 6 hours as needed and only if needed. 3. Norco (hydrocodone/acetaminophen) 5/325 mg:  This is only to be  used after you have taken the motrin or the tylenol. Every 8 hours as needed. Over the counter Medication to take: 4. Ibuprofen (Motrin) 600 mg:  Take this every 6 hours.  If you have additional pain then take 500 mg of the tylenol.  Only take the Norco after you have tried these two. 5. Miralax or stool softener of choice: Take this according to the bottle if you take the Norco.  WHEN TO CALL Call your surgeon's office if any of the following occur: . Fever 101 degrees F or greater . Excessive bleeding or fluid from the incision site. . Pain that increases over time without aid from the medications . Redness, warmth, or pus draining from incision sites . Persistent nausea or inability to take in liquids . Severe misshapen area that underwent the operation.  

## 2019-04-30 NOTE — Transfer of Care (Signed)
Immediate Anesthesia Transfer of Care Note  Patient: Kelli Logan  Procedure(s) Performed: Fat grafting to left breast (Left Breast) release of scar contracture to left breast (Left Breast)  Patient Location: PACU  Anesthesia Type:General  Level of Consciousness: sedated  Airway & Oxygen Therapy: Patient Spontanous Breathing and Patient connected to face mask oxygen  Post-op Assessment: Report given to RN and Post -op Vital signs reviewed and stable  Post vital signs: Reviewed and stable  Last Vitals:  Vitals Value Taken Time  BP 113/72 04/30/19 0909  Temp    Pulse 74 04/30/19 0910  Resp 8 04/30/19 0910  SpO2 96 % 04/30/19 0910  Vitals shown include unvalidated device data.  Last Pain:  Vitals:   04/30/19 0635  TempSrc: Oral  PainSc: 4       Patients Stated Pain Goal: 4 ([id number])  Complications: No apparent anesthesia complications

## 2019-04-30 NOTE — Anesthesia Postprocedure Evaluation (Signed)
Anesthesia Post Note  Patient: Kelli Logan  Procedure(s) Performed: Fat grafting to left breast (Left Breast) release of scar contracture to left breast (Left Breast)     Patient location during evaluation: PACU Anesthesia Type: General Level of consciousness: awake and alert Pain management: pain level controlled Vital Signs Assessment: post-procedure vital signs reviewed and stable Respiratory status: spontaneous breathing, nonlabored ventilation, respiratory function stable and patient connected to nasal cannula oxygen Cardiovascular status: blood pressure returned to baseline and stable Postop Assessment: no apparent nausea or vomiting Anesthetic complications: no    Last Vitals:  Vitals:   04/30/19 0945 04/30/19 1000  BP: 118/78 120/87  Pulse: 72 76  Resp: 16 11  Temp:    SpO2: 100% 97%    Last Pain:  Vitals:   04/30/19 1000  TempSrc:   PainSc: Asleep                 Female Iafrate S

## 2019-04-30 NOTE — Interval H&P Note (Signed)
History and Physical Interval Note:  04/30/2019 6:58 AM  Kelli Logan  has presented today for surgery, with the diagnosis of Status Post Left Mastectomy; Breast asymmetry following reconstructive surgery.  The various methods of treatment have been discussed with the patient and family. After consideration of risks, benefits and other options for treatment, the patient has consented to  Procedure(s) with comments: Fat grafting to left breast (Left) - 90 min, please release of scar contracture to left breast (Left) as a surgical intervention.  The patient's history has been reviewed, patient examined, no change in status, stable for surgery.  I have reviewed the patient's chart and labs.  Questions were answered to the patient's satisfaction.     Loel Lofty [name]

## 2019-05-01 DIAGNOSIS — T8544XA Capsular contracture of breast implant, initial encounter: Secondary | ICD-10-CM | POA: Diagnosis not present

## 2019-05-01 MED ORDER — ONDANSETRON HCL 4 MG PO TABS: 4.0000 mg | ORAL_TABLET | Freq: Three times a day (TID) | ORAL | 0 refills | Status: DC | PRN

## 2019-05-01 MED ORDER — FLUCONAZOLE 150 MG PO TABS: 150.0000 mg | ORAL_TABLET | Freq: Once | ORAL | 0 refills | Status: AC

## 2019-05-01 MED ORDER — CEPHALEXIN 500 MG PO CAPS: 500.0000 mg | ORAL_CAPSULE | Freq: Four times a day (QID) | ORAL | 0 refills | Status: AC

## 2019-05-01 MED ORDER — HYDROCODONE-ACETAMINOPHEN 5-325 MG PO TABS: 1.0000 | ORAL_TABLET | Freq: Four times a day (QID) | ORAL | 0 refills | Status: AC | PRN

## 2019-05-01 NOTE — Discharge Summary (Signed)
Physician Discharge Summary  Patient ID: Kelli Logan MRN: [id number] DOB/AGE: 60-Sep-1962 60 y.o.  Admit date: 04/30/2019 Discharge date: 05/01/2019  Admission Diagnoses: Acquired absence of left breast  Discharge Diagnoses:  Active Problems:   Acquired absence of left breast   Discharged Condition: good  Hospital Course: Patient is a 59 year old female who presented to the [facility] on 04/30/2019 for release of left breast capsule contracture and fat grafting to left breast by Dr. Marla Roe.  Patient was kept overnight for observation.  No acute overnight events.  No fevers, chills, vomiting.  Patient reports surgical pain in left breast as well as abdomen, 3 out of 10 this a.m.  She reports she got poor sleep last night.  No other complaints  Consults: None  Significant Diagnostic Studies: none  Treatments: antibiotics: Ancef, analgesia: oxycodone, apap and surgery: Release of left breast capsule contracture, fat grafting to left breast.  Discharge Exam: Blood pressure 127/84, pulse 69, temperature (!) 97.3 F (36.3 C), resp. rate 18, height 5\' 6"  (1.676 m), weight 88 kg, SpO2 99 %. General appearance: alert, cooperative, no distress and Resting comfortably in bed Resp: Unlabored, symmetric rise and fall Breasts: Right breast status post reduction/mastopexy with well-healed incisions.  Left breast with bruising overlying entire breast, some swelling noted, breast is soft, mild tenderness, incisions are C/D/I.  No drainage noted.  No cellulitic changes noted. Nursing staff noted bruising stable since last night, not worsening.  Abdomen: Abdominal binder in place, mild swelling noted, Mild TTP. Incision inferior to umbilicus c/d/i. Some bruising superiorly, mild.  Extremities: Bilateral lower extremities without any edema, nontender to palpation Pulses: 2+ and symmetric Neurologic: Grossly normal  Disposition: Discharge disposition: 01-Home or Self  Care      Discharge Instructions    Call MD for:  difficulty breathing, headache or visual disturbances   Complete by: As directed    Call MD for:  extreme fatigue   Complete by: As directed    Call MD for:  hives   Complete by: As directed    Call MD for:  persistant dizziness or light-headedness   Complete by: As directed    Call MD for:  persistant nausea and vomiting   Complete by: As directed    Call MD for:  redness, tenderness, or signs of infection (pain, swelling, redness, odor or green/yellow discharge around incision site)   Complete by: As directed    Call MD for:  severe uncontrolled pain   Complete by: As directed    Call MD for:  temperature >100.4   Complete by: As directed    Diet - low sodium heart healthy   Complete by: As directed    Increase activity slowly   Complete by: As directed      Allergies as of 05/01/2019      Reactions   Tape Other (See Comments)   Paper tape after breast surgery/Burned skin   Tegaderm and other tape OK      Medication List    TAKE these medications   atorvastatin 40 MG tablet Commonly known as: LIPITOR Take 40 mg by mouth at bedtime.   BIOTIN PO Take 100 mcg by mouth daily.   busPIRone 15 MG tablet Commonly known as: BUSPAR TAKE 1 TABLET (15 MG TOTAL) BY MOUTH 3 (THREE) TIMES DAILY.   CAL-MAG-ZINC PO Take 1 tablet by mouth daily in the afternoon.   cephALEXin 500 MG capsule Commonly known as: KEFLEX Take 1 capsule (500 mg total)  by mouth 4 (four) times daily for 5 days.   CHROMIUM PICOLATE PO Take 1,000 mcg by mouth daily.   citalopram 40 MG tablet Commonly known as: CELEXA Take 1 tablet (40 mg total) by mouth every morning.   CLONAZEPAM PO Take by mouth.   CoQ10 100 MG Caps Take 100 mg by mouth every evening.   cyclobenzaprine 10 MG tablet Commonly known as: FLEXERIL 1/2-1 po bid prn   diclofenac 75 MG EC tablet Commonly known as: VOLTAREN twice daily with food for 1 month, then decrease to  75 mg once daily for 1 month, then discontinue. No over the counter pain relievers except Tylenol take with food   doxazosin 1 MG tablet Commonly known as: CARDURA Take 1 tablet (1 mg total) by mouth 2 (two) times daily.   FISH OIL PO Take 1 capsule by mouth daily.   FLAXSEED OIL PO Take 1 capsule by mouth daily.   fluconazole 150 MG tablet Commonly known as: DIFLUCAN Take 1 tablet (150 mg total) by mouth once for 1 dose.   gabapentin 600 MG tablet Commonly known as: Neurontin Take 1 tablet (600 mg total) by mouth as directed. TAKE HALF TABLET IN THE AM AND HALF TABLET AT LUNCH AND 1 TABLET( 600 MG ) AT BEDTIME   GLUCOSAMINE-CHONDROITIN PO Take 2 tablets by mouth daily. 1500-1200   hydrochlorothiazide 25 MG tablet Commonly known as: HYDRODIURIL Take 25 mg by mouth daily.   HYDROcodone-acetaminophen 5-325 MG tablet Commonly known as: Norco Take 1 tablet by mouth every 6 (six) hours as needed for up to 5 days for moderate pain.   hydrOXYzine 50 MG capsule Commonly known as: Vistaril Take 1 capsule (50 mg total) by mouth 2 (two) times daily as needed. For anxiety attacks   levothyroxine 50 MCG tablet Commonly known as: SYNTHROID Take 50 mcg by mouth every other day.   losartan 100 MG tablet Commonly known as: COZAAR Take 100 mg by mouth daily. for high blood pressure   metoprolol tartrate 50 MG tablet Commonly known as: Lopressor Take 1 tablet (50 mg total) by mouth 2 (two) times daily.   multivitamin with minerals Tabs tablet Take 1 tablet by mouth daily.   naproxen 500 MG tablet Commonly known as: NAPROSYN Take 500 mg by mouth 2 (two) times daily as needed for moderate pain.   omeprazole 20 MG capsule Commonly known as: PRILOSEC Take 20 mg by mouth daily.   ondansetron 4 MG tablet Commonly known as: Zofran Take 1 tablet (4 mg total) by mouth every 8 (eight) hours as needed for nausea or vomiting.   PROBIOTIC DAILY PO Take 2 capsules by mouth daily.    QUEtiapine 50 MG tablet Commonly known as: SEROQUEL Take 3 tablets (150 mg total) by mouth at bedtime. PRESCRIBED BY NEUROLOGY What changed: how much to take      Follow-up Information    Dillingham, Loel Lofty, DO In 1 week.   Specialty: Plastic Surgery Contact information: [address] [place] [address] [facility] [address] [place], [place] [address] [phone]  Signed: Carola Rhine Salman Wellen 05/01/2019, 8:01 AM

## 2019-05-04 ENCOUNTER — Encounter: Payer: Self-pay | Admitting: *Deleted

## 2019-05-05 ENCOUNTER — Telehealth: Payer: Self-pay

## 2019-05-05 NOTE — Telephone Encounter (Signed)
Patient states she has has been having intense itching on her stomach area. She would like to know how to treat this and if she can apply hydrocortisone cream to it.

## 2019-05-05 NOTE — Telephone Encounter (Signed)
Kelli Logan returned call. Patient indicated her stomach is extremely itchy. It appears not to be a rash and could be possibly be irritation from the binder. Advised patient to wear a tank top or T-shirt under binder.  Patient has a follow up appointment this Friday. If anything changes, fever, chills, vomiting, red to touch to call our office sooner.

## 2019-05-08 ENCOUNTER — Encounter: Payer: Self-pay | Admitting: Surgical

## 2019-05-08 ENCOUNTER — Other Ambulatory Visit: Payer: Self-pay

## 2019-05-08 ENCOUNTER — Ambulatory Visit (INDEPENDENT_AMBULATORY_CARE_PROVIDER_SITE_OTHER): Payer: Medicare Other | Admitting: Surgical

## 2019-05-08 VITALS — BP 152/95 | HR 71 | Temp 97.8°F | Ht 66.25 in | Wt 196.6 lb

## 2019-05-08 DIAGNOSIS — Z9889 Other specified postprocedural states: Secondary | ICD-10-CM

## 2019-05-08 DIAGNOSIS — Z9012 Acquired absence of left breast and nipple: Secondary | ICD-10-CM

## 2019-05-08 DIAGNOSIS — N651 Disproportion of reconstructed breast: Secondary | ICD-10-CM

## 2019-05-08 NOTE — Progress Notes (Signed)
Patient is a 59 year old female here for follow-up after fat grafting to left breast and release of scar contracture of left breast on 04/30/2019 with Dr. Marla Roe.  She also had liposuction of abdomen for fat grafting.  She is doing well today.  She does not have any fevers, chills, nausea, vomiting, chest pain, shortness of breath.  She did report that she had a lot of bruising over her left breast initially.  She also reports that she is having some pulling sensation in her left axilla.  She also reports that she feels she is asymmetric when wearing a bra, she feels as if the left breast has more volume at the top.  She does have loss of volume at the superior pole of her right breast, we discussed that this is likely due to it being a natural breast and that is her normal anatomy.   Chaperone present on exam On exam left breast incisions are healing nicely, she does have a little bit of irritation around the left lateral incision, but no sign of infection.  No foul odor no drainage.  She has yellowing bruising noted over the majority of the left breast and a more recent purple bruise over the left lateral breast near the axilla.  It is minimally tender to palpation.  There is no erythema   Plan: Patient is postop from fat grafting to left breast and release of scar contracture, she is doing really well.  No sign of infection, seroma, hematoma.  Recommend purchasing a sports bra or wearing breast binder for compression.  Patient is currently wearing a normal bra, I advised her that this will not be substantial enough for compression and that she may get a fluid collection.  Patient is scheduled for follow-up in 2 weeks. Call with any questions or concerns.

## 2019-05-22 ENCOUNTER — Encounter: Payer: Self-pay | Admitting: Plastic Surgery

## 2019-05-22 ENCOUNTER — Ambulatory Visit (INDEPENDENT_AMBULATORY_CARE_PROVIDER_SITE_OTHER): Payer: Medicare Other | Admitting: Surgical

## 2019-05-22 ENCOUNTER — Other Ambulatory Visit: Payer: Self-pay

## 2019-05-22 ENCOUNTER — Encounter: Payer: Medicare Other | Admitting: Plastic Surgery

## 2019-05-22 VITALS — BP 130/86 | HR 74 | Temp 97.8°F | Ht 66.0 in | Wt 193.0 lb

## 2019-05-22 DIAGNOSIS — Z9012 Acquired absence of left breast and nipple: Secondary | ICD-10-CM

## 2019-05-22 DIAGNOSIS — Z170 Estrogen receptor positive status [ER+]: Secondary | ICD-10-CM

## 2019-05-22 DIAGNOSIS — C50512 Malignant neoplasm of lower-outer quadrant of left female breast: Secondary | ICD-10-CM

## 2019-05-22 DIAGNOSIS — N651 Disproportion of reconstructed breast: Secondary | ICD-10-CM

## 2019-05-22 DIAGNOSIS — Z9889 Other specified postprocedural states: Secondary | ICD-10-CM

## 2019-05-22 NOTE — Progress Notes (Signed)
Patient is a 59 year old female here for follow-up after fat grafting to left breast and release of scar contracture of left breast on 04/30/2019 with Dr. Marla Roe.  Patient is doing well today, incisions are well-healed, bruising to left breast and abdomen have resolved.  She previously underwent right breast mastopexy/reduction to match left breast after mastectomy.  She is still concerned about the loss of volume of her right breast, she would like to discuss further options related to gaining more symmetry when wearing a bra.  Chaperone present on exam Bilateral breast incisions are well-healed, no bruising noted over bilateral breasts, no bruising noted over abdomen, no erythema, no drainage, no wounds noted.  Right breast does have a loss of volume at the superior pole.  The patient is doing well postop from recent surgery on 04/30/2019.  We discussed further reconstructive options to the right breast to match the reconstructed left breast which included possible implant placement and possible liposuction and Lipo filling.  We would want to wait at least 6 months for patient to have final results from left breast implant placement, scar contracture release and fat grafting.  We will schedule an appointment for 6 months to further discuss.  Pictures were obtained of the patient and placed in the chart with the patient's or guardian's permission.  Call with any questions or concerns.

## 2019-06-11 ENCOUNTER — Other Ambulatory Visit: Payer: Self-pay | Admitting: Cardiovascular Disease

## 2019-06-11 NOTE — Telephone Encounter (Signed)
Called pt, unable to lvm due to no vm setup

## 2019-06-11 NOTE — Telephone Encounter (Signed)
Please schedule office visit for refills. Thank you! 

## 2019-06-17 DIAGNOSIS — G8929 Other chronic pain: Secondary | ICD-10-CM | POA: Insufficient documentation

## 2019-06-26 NOTE — Telephone Encounter (Signed)
Patient declined recall . Will fu with pcp for refills. Closing encounter.

## 2019-07-02 DIAGNOSIS — G8929 Other chronic pain: Secondary | ICD-10-CM | POA: Insufficient documentation

## 2019-07-02 DIAGNOSIS — R102 Pelvic and perineal pain: Secondary | ICD-10-CM | POA: Insufficient documentation

## 2019-07-05 ENCOUNTER — Other Ambulatory Visit: Payer: Self-pay | Admitting: Cardiovascular Disease

## 2019-07-06 NOTE — Telephone Encounter (Signed)
Patient states she wants this to go through her PCP and that she will discuss it with them

## 2019-07-06 NOTE — Telephone Encounter (Signed)
Please schedule office visit for further refills. Thank you! 

## 2019-07-07 ENCOUNTER — Telehealth: Payer: Self-pay

## 2019-07-07 NOTE — Telephone Encounter (Signed)
Patient called as she experienced two falls a few weeks ago. Since then, she has experienced discomfort in her abdomen and is concerned it could be to do with her surgery on 04/30/2019. She has requested a call back to discuss.

## 2019-07-08 NOTE — Telephone Encounter (Signed)
Discussed with patient, she reports she is having some lower abdominal pain, reports it feels like her "intestines are in a knot". She is also having terrible migraines and has spoke with neurology who is managing and planning to see her next week. She reports she is under a lot of stress due to a new kitten and is unsure what's causing her abdominal pain. She does not notice any swelling. She underwent liposuction and fat filling of breasts on 04/30/19.  Discussed with patient I would be happy to see her next week or sooner if needed. She is going to check her schedule and call the office back to schedule.  Recommend calling with further questions,concerns.

## 2019-07-15 ENCOUNTER — Other Ambulatory Visit: Payer: Self-pay | Admitting: Cardiovascular Disease

## 2019-07-20 ENCOUNTER — Telehealth: Payer: Self-pay

## 2019-07-20 NOTE — Telephone Encounter (Signed)
pt called states that she believes she is having reaction to the buspar . she states that she has been having trumors and she feels that it maybe coming from that and she wants to come off of it.

## 2019-07-20 NOTE — Telephone Encounter (Signed)
I spoke with Kelli Logan, she reports that Buspar is not doing any extra benefit for her, she has been having tremors, and muscle spasm and she believes buspar could be causing and therefore she wants wean or discontinue. Discussed to decrease the dose to 10 mg TID and discuss with Dr. Shea Evans on further instruction on weaning off once she returns on 7/26. She verbalized understanding. She reports that Buspar 15 mg TID is scored at 10 mg and she can cut and use them. Discussed that writer is forwarding this message to Dr. Shea Evans.

## 2019-08-03 NOTE — Telephone Encounter (Signed)
Returned call to patient. Left voicemail.

## 2019-08-07 DIAGNOSIS — F633 Trichotillomania: Secondary | ICD-10-CM | POA: Insufficient documentation

## 2019-08-10 ENCOUNTER — Telehealth: Payer: Self-pay | Admitting: Plastic Surgery

## 2019-08-10 NOTE — Telephone Encounter (Signed)
Spoke with patient.  Recommend she speak with Dr. Keturah Barre at her appt scheduled for the beginning of Oct.  That will give the left side a little time to heal to better target symmetry.  However, she is welcome to come in sooner if desired.

## 2019-08-10 NOTE — Telephone Encounter (Signed)
Patient called to see if she needed to wait 6 months to have the right breast done because she was originally was told to wait 6 months from her last procedure, but because this is a different side she wasn't sure if that still applied. Please call patient to advise if she can come in sooner to evaluate for right breast symmetry.

## 2019-08-12 ENCOUNTER — Telehealth: Payer: Self-pay

## 2019-08-12 NOTE — Telephone Encounter (Signed)
Patient was no show at her last appt and the last visit was 6 months ago , she needs to be evaluated for med recommendations . Please let her know to schedule an appt and I am not making medication changes until she is evaluated again due to the gap.

## 2019-08-12 NOTE — Telephone Encounter (Signed)
pt called left message that she was returning your call.  she stated that she was taking the buspirone and that another doctor also gave her depakote and she stopped both medications because she was having muscle spasms and shacken so bad. she like to speak with you about the medications.

## 2019-08-13 ENCOUNTER — Other Ambulatory Visit: Payer: Self-pay | Admitting: Family Medicine

## 2019-08-13 DIAGNOSIS — Z1231 Encounter for screening mammogram for malignant neoplasm of breast: Secondary | ICD-10-CM

## 2019-09-03 DIAGNOSIS — R296 Repeated falls: Secondary | ICD-10-CM | POA: Insufficient documentation

## 2019-09-03 DIAGNOSIS — G479 Sleep disorder, unspecified: Secondary | ICD-10-CM | POA: Insufficient documentation

## 2019-09-03 DIAGNOSIS — R413 Other amnesia: Secondary | ICD-10-CM | POA: Insufficient documentation

## 2019-09-03 DIAGNOSIS — M62838 Other muscle spasm: Secondary | ICD-10-CM | POA: Insufficient documentation

## 2019-09-15 ENCOUNTER — Telehealth (INDEPENDENT_AMBULATORY_CARE_PROVIDER_SITE_OTHER): Payer: Medicare Other | Admitting: Psychiatry

## 2019-09-15 ENCOUNTER — Other Ambulatory Visit: Payer: Self-pay

## 2019-09-15 ENCOUNTER — Encounter: Payer: Self-pay | Admitting: Psychiatry

## 2019-09-15 DIAGNOSIS — F4310 Post-traumatic stress disorder, unspecified: Secondary | ICD-10-CM | POA: Diagnosis not present

## 2019-09-15 DIAGNOSIS — F09 Unspecified mental disorder due to known physiological condition: Secondary | ICD-10-CM

## 2019-09-15 DIAGNOSIS — F4010 Social phobia, unspecified: Secondary | ICD-10-CM | POA: Diagnosis not present

## 2019-09-15 DIAGNOSIS — F424 Excoriation (skin-picking) disorder: Secondary | ICD-10-CM

## 2019-09-15 DIAGNOSIS — F410 Panic disorder [episodic paroxysmal anxiety] without agoraphobia: Secondary | ICD-10-CM | POA: Diagnosis not present

## 2019-09-15 DIAGNOSIS — Z8659 Personal history of other mental and behavioral disorders: Secondary | ICD-10-CM

## 2019-09-15 MED ORDER — BUSPIRONE HCL 15 MG PO TABS: 7.5000 mg | ORAL_TABLET | Freq: Two times a day (BID) | ORAL | 0 refills | Status: DC
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
## 2019-09-17 ENCOUNTER — Telehealth: Payer: Self-pay

## 2019-09-17 NOTE — Telephone Encounter (Signed)
Yes, please try to find another neuropsychologist

## 2019-09-17 NOTE — Telephone Encounter (Signed)
received a fax that Provider Fanny Bien at [facility] is full for 2021 they can be added to waiting list that will open up in oct to be schedule for 2022.  do you want to schedule with someone else?

## 2019-09-23 ENCOUNTER — Telehealth: Payer: Self-pay

## 2019-09-23 NOTE — Telephone Encounter (Signed)
Please let patient know it is OK to reduce Seroquel to 100 mg  Seroquel is being prescribed by neurology.

## 2019-09-23 NOTE — Telephone Encounter (Signed)
pt called left message that she wants to go back to 100mg  she stated that the 150mg  she having bad dreams

## 2019-10-01 ENCOUNTER — Ambulatory Visit
Admission: RE | Admit: 2019-10-01 | Discharge: 2019-10-01 | Disposition: A | Discharge status: Home / Self Care | Payer: Medicare Other | Source: Ambulatory Visit | Attending: Family Medicine | Admitting: Family Medicine

## 2019-10-01 ENCOUNTER — Other Ambulatory Visit: Payer: Self-pay

## 2019-10-01 DIAGNOSIS — Z1231 Encounter for screening mammogram for malignant neoplasm of breast: Secondary | ICD-10-CM

## 2019-10-02 ENCOUNTER — Other Ambulatory Visit: Payer: Self-pay | Admitting: Psychiatry

## 2019-10-02 DIAGNOSIS — F424 Excoriation (skin-picking) disorder: Secondary | ICD-10-CM

## 2019-10-02 DIAGNOSIS — F4310 Post-traumatic stress disorder, unspecified: Secondary | ICD-10-CM

## 2019-10-02 DIAGNOSIS — F4010 Social phobia, unspecified: Secondary | ICD-10-CM

## 2019-10-02 DIAGNOSIS — F410 Panic disorder [episodic paroxysmal anxiety] without agoraphobia: Secondary | ICD-10-CM

## 2019-10-05 ENCOUNTER — Other Ambulatory Visit: Payer: Self-pay

## 2019-10-05 ENCOUNTER — Ambulatory Visit (INDEPENDENT_AMBULATORY_CARE_PROVIDER_SITE_OTHER): Payer: Medicare Other | Admitting: Licensed Clinical Social Worker

## 2019-10-05 DIAGNOSIS — F4310 Post-traumatic stress disorder, unspecified: Secondary | ICD-10-CM

## 2019-10-05 NOTE — Progress Notes (Signed)
Virtual Visit via Telephone Note  I connected with Kelli Logan on 10/05/19 at  2:30 PM EDT by telephone and verified that I am speaking with the correct person using two identifiers.  Location: Patient: home Provider: [facility]   I discussed the limitations, risks, security and privacy concerns of performing an evaluation and management service by telephone and the availability of in person appointments. I also discussed with the patient that there may be a patient responsible charge related to this service. The patient expressed understanding and agreed to proceed.   I discussed the assessment and treatment plan with the patient. The patient was provided an opportunity to ask questions and all were answered. The patient agreed with the plan and demonstrated an understanding of the instructions.   The patient was advised to call back or seek an in-person evaluation if the symptoms worsen or if the condition fails to improve as anticipated.  I provided 45 minutes of non-face-to-face time during this encounter.   Annaka Cleaver R Miana Politte, LCSW   THERAPIST PROGRESS NOTE  Session Time: 2:48-3:31p  Participation Level: Active  Behavioral Response: Neat and Well GroomedAlertAnxious and Depressed  Type of Therapy: Individual Therapy  Treatment Goals addressed: Anxiety and Coping  Interventions: Supportive  Summary: Kelli Logan is a 59 y.o. female who presents with symptoms related to PTSD diagnosis.   Pt reported initially that "I'm not sure I really want to do this counseling thing.  Im tired of telling my story over and over to people".   Allowed pt safe space to express thoughts and feelings about recent losse, health-related concerns, and multiple traumatic situations. Assured pt that she would receive unconditional positive support/regard as she processes through losses and trauma.  Encouraged self care and life balance.   Suicidal/Homicidal: No  Therapist Response:  Developed treatment plan.   Plan: Return again in 2 weeks.  Diagnosis: Axis I: Post Traumatic Stress Disorder    Axis II: No diagnosis    Rachel Bo Khamil Lamica, LCSW 10/05/2019

## 2019-10-07 ENCOUNTER — Telehealth: Payer: Self-pay

## 2019-10-07 NOTE — Telephone Encounter (Signed)
call dr. Bobby Rumpf office to check on the status of referral.  spoke with referrall person and she pulled it up she states that they only do children. she advises pt to call her insurnace and find out who is in net work with her insurance to get testing done

## 2019-10-07 NOTE — Telephone Encounter (Signed)
Jess could you please send her to one of our [facility] providers you recently had information on ?The ones for ADHD testing ??

## 2019-10-07 NOTE — Telephone Encounter (Signed)
pt called left message that she wanted to know about where you was sending her for memory issues and she also styted that her after care note states she has kidney disease

## 2019-10-07 NOTE — Telephone Encounter (Signed)
called left message that the provider we set up for her is only seeing children at this time and was advised that patient contact her insurance company and find out who is in network.,  pt was told that i would continue to look.  And a message was sent to dr. Shea Evans about the kidney disease on her after visit care form.

## 2019-10-13 ENCOUNTER — Other Ambulatory Visit: Payer: Self-pay

## 2019-10-13 ENCOUNTER — Encounter: Payer: Self-pay | Admitting: Plastic Surgery

## 2019-10-13 ENCOUNTER — Ambulatory Visit (INDEPENDENT_AMBULATORY_CARE_PROVIDER_SITE_OTHER): Payer: Medicare Other | Admitting: Plastic Surgery

## 2019-10-13 VITALS — BP 127/85 | HR 77 | Temp 98.3°F | Wt 195.0 lb

## 2019-10-13 DIAGNOSIS — N651 Disproportion of reconstructed breast: Secondary | ICD-10-CM
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
## 2019-10-21 ENCOUNTER — Ambulatory Visit (INDEPENDENT_AMBULATORY_CARE_PROVIDER_SITE_OTHER): Payer: Medicare Other | Admitting: Licensed Clinical Social Worker

## 2019-10-21 ENCOUNTER — Other Ambulatory Visit: Payer: Self-pay

## 2019-10-21 ENCOUNTER — Encounter: Payer: Self-pay | Admitting: Plastic Surgery

## 2019-10-21 DIAGNOSIS — F4310 Post-traumatic stress disorder, unspecified: Secondary | ICD-10-CM | POA: Diagnosis not present

## 2019-10-21 NOTE — Progress Notes (Signed)
Virtual Visit via Telephone Note  I connected with Kelli Logan on 10/21/19 at 12:30 PM EDT by telephone and verified that I am speaking with the correct person using two identifiers.  Location: Patient: home Provider: remote office [place], [place])   I discussed the limitations, risks, security and privacy concerns of performing an evaluation and management service by telephone and the availability of in person appointments. I also discussed with the patient that there may be a patient responsible charge related to this service. The patient expressed understanding and agreed to proceed.   The patient was advised to call back or seek an in-person evaluation if the symptoms worsen or if the condition fails to improve as anticipated.  I provided 45 minutes of non-face-to-face time during this encounter.   [name], LCSW   THERAPIST PROGRESS NOTE  Session Time: 12:30-1:30  Participation Level: Active  Behavioral Response: NAAlertAnxious  Type of Therapy: Individual Therapy  Treatment Goals addressed: Anxiety and Coping  Interventions: Supportive  Summary: Kelli Logan is a 59 y.o. female who presents with symptoms consistent with PTSD: depression, anxiety, flashbacks. Pt reports that she feels mood is fluctuating and that she is managing anxiety "fair".   Allowed pt to process through thoughts and feelings associated with trauma: accident. Pt described many details about event, and linked present and past physical difficulties and cognitive changes to the accident.   Pt expressed stress associated with one particular physician that made a comment about weight and recommended pt go to weight management. Pt feels very offended and feels like it was a blow to her overall self esteem.   Discussed several health-related concerns and allowed pt to discuss thoughts and feelings about treatment plans.  Reviewed importance of overall self care and life balance. Pt  is using good coping skills and being intentional about prioritizing her mental health  Suicidal/Homicidal: No  Therapist Response: Debrorah continues to make good progress with self understanding and self insight. Pt continues to develop self assertion skills and positive self promotion.   Plan: Return again in 2 weeks.The ongoing treatment plan includes maintaining current levels of progress and continuing to build skills to manage mood, improve stress/anxiety management, emotion regulation, distress tolerance, and behavior modification.   Diagnosis: Axis I: Post Traumatic Stress Disorder    Axis II: Deferred  Villalba, LCSW 10/21/2019

## 2019-10-22 ENCOUNTER — Ambulatory Visit: Payer: Medicare Other | Admitting: Internal Medicine

## 2019-10-26 ENCOUNTER — Inpatient Hospital Stay: Payer: Medicare Other | Attending: Internal Medicine | Admitting: Internal Medicine

## 2019-10-26 DIAGNOSIS — Z170 Estrogen receptor positive status [ER+]: Secondary | ICD-10-CM | POA: Diagnosis not present

## 2019-10-26 DIAGNOSIS — R5383 Other fatigue: Secondary | ICD-10-CM | POA: Insufficient documentation

## 2019-10-26 DIAGNOSIS — Z9010 Acquired absence of unspecified breast and nipple: Secondary | ICD-10-CM | POA: Insufficient documentation

## 2019-10-26 DIAGNOSIS — M2550 Pain in unspecified joint: Secondary | ICD-10-CM | POA: Insufficient documentation

## 2019-10-26 DIAGNOSIS — C50512 Malignant neoplasm of lower-outer quadrant of left female breast: Secondary | ICD-10-CM | POA: Insufficient documentation

## 2019-10-26 DIAGNOSIS — R232 Flushing: Secondary | ICD-10-CM | POA: Insufficient documentation

## 2019-10-26 NOTE — Assessment & Plan Note (Addendum)
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  LOW risk-Oncotype.  Discontinued letrozole- because of severe intolerance.[severe fatigue/joint pains hot flashes/post coital pain].  STABLE.  # Hair loss: Unlikely due to 1 month use of letrozole discontinued most year ago.  Etiology;defer to dermatology/PCP.  # DISPOSITION:  # follow up in 84 month- MD; no labs--Dr.B

## 2019-10-26 NOTE — Progress Notes (Signed)
I connected with Kelli Logan on 10/18/2021at  3:30 PM EDT by video enabled telemedicine visit and verified that I am speaking with the correct person using two identifiers.  I discussed the limitations, risks, security and privacy concerns of performing an evaluation and management service by telemedicine and the availability of in-person appointments. I also discussed with the patient that there may be a patient responsible charge related to this service. The patient expressed understanding and agreed to proceed.    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 s/p mastectomy [Dr.Cannon/[place]];   # Oncotype-RS- 20; Distant recurrence-risk of recurrence 6%.  No adjuvant chemo.  No radiation.  #May 09, 2018-letrozole [LMP- 2018] x discontinued because of severe intolerance [severe fatigue/arthralgias/postcoital pain]- after 1 month.   # anxiety/depression  DIAGNOSIS: Right Breast ca  STAGE:  I; GOALS: cure  CURRENT/MOST RECENT THERAPY: surveilliance     Malignant neoplasm of lower-outer quadrant of left breast of female, estrogen receptor positive ([facility]   Chief Complaint: left breast cancer  History of present illness:Kelli Logan 59y.o.  female with history of stage I breast cancer ER/PR positive HER-2 negative status post mastectomy is here for follow-up.  Patient is not on AI because of intolerance.  Patient continues to complain of hair loss which she is concerned about prior use of AI.   In the interim patient has been evaluated plastic surgery-and is awaiting mastopexy of her contralateral right breast.   Observation/objective: No acute distress.    Assessment and plan: Malignant neoplasm of lower-outer quadrant of left breast of female, estrogen receptor positive ([facility] # Stage I left breast IMC; pT1b [6 mm] ER-Pos; PR-neg; Her 2 neu status post mastectomy.  LOW risk-Oncotype.  Discontinued letrozole- because of severe intolerance.[severe fatigue/joint pains hot flashes/post coital pain].  STABLE.  # Hair loss: Unlikely due to 1 month use of letrozole discontinued most year ago.  Etiology;defer to dermatology/PCP.  # DISPOSITION:  # follow up in 120 month MD; no labs--Dr.B    Follow-up instructions:  I discussed the assessment and treatment plan with the patient.  The patient was provided an opportunity to ask questions and all were answered.  The patient agreed with the plan and demonstrated understanding of instructions.  The patient was advised to call back or seek an in person evaluation if the symptoms worsen or if the condition fails to improve as anticipated.  Dr. GCharlaine DaltonCWalnutat [facility]10/19/2021 2:03 PM

## 2019-10-27 ENCOUNTER — Telehealth: Payer: Medicare Other | Admitting: Psychiatry

## 2019-11-06 ENCOUNTER — Other Ambulatory Visit: Payer: Self-pay | Admitting: Psychiatry

## 2019-11-06 DIAGNOSIS — F410 Panic disorder [episodic paroxysmal anxiety] without agoraphobia: Secondary | ICD-10-CM

## 2019-11-06 DIAGNOSIS — F424 Excoriation (skin-picking) disorder: Secondary | ICD-10-CM

## 2019-11-09 ENCOUNTER — Telehealth (INDEPENDENT_AMBULATORY_CARE_PROVIDER_SITE_OTHER): Payer: Medicare Other | Admitting: Psychiatry

## 2019-11-09 ENCOUNTER — Encounter: Payer: Self-pay | Admitting: Psychiatry

## 2019-11-09 ENCOUNTER — Other Ambulatory Visit: Payer: Self-pay

## 2019-11-09 DIAGNOSIS — F4310 Post-traumatic stress disorder, unspecified: Secondary | ICD-10-CM | POA: Diagnosis not present

## 2019-11-09 DIAGNOSIS — F424 Excoriation (skin-picking) disorder: Secondary | ICD-10-CM | POA: Diagnosis not present

## 2019-11-09 DIAGNOSIS — F410 Panic disorder [episodic paroxysmal anxiety] without agoraphobia: Secondary | ICD-10-CM

## 2019-11-09 DIAGNOSIS — F4010 Social phobia, unspecified: Secondary | ICD-10-CM

## 2019-11-09 DIAGNOSIS — F09 Unspecified mental disorder due to known physiological condition: Secondary | ICD-10-CM

## 2019-11-09 DIAGNOSIS — Z8659 Personal history of other mental and behavioral disorders: Secondary | ICD-10-CM

## 2019-11-09 MED ORDER — BUSPIRONE HCL 15 MG PO TABS: 7.5000 mg | ORAL_TABLET | Freq: Two times a day (BID) | ORAL | 0 refills | Status: DC

## 2019-11-09 NOTE — Progress Notes (Signed)
Virtual Visit via Video Note  I connected with Kelli Logan on 11/09/19 at  2:40 PM EDT by a video enabled telemedicine application and verified that I am speaking with the correct person using two identifiers.  Location Provider Location : [facility] Patient Location : Home  Participants: Patient , Provider   I discussed the limitations of evaluation and management by telemedicine and the availability of in person appointments. The patient expressed understanding and agreed to proceed.    I discussed the assessment and treatment plan with the patient. The patient was provided an opportunity to ask questions and all were answered. The patient agreed with the plan and demonstrated an understanding of the instructions.   The patient was advised to call back or seek an in-person evaluation if the symptoms worsen or if the condition fails to improve as anticipated.   [place] MD OP Progress Note  11/10/2019 8:59 AM Kelli Logan  MRN:  [id number]  Chief Complaint:  Chief Complaint    Follow-up     HPI: Kelli Logan is a 59 year old Caucasian female, divorced, lives in [place], has a history of PTSD, skin picking disorder, social anxiety, history of borderline personality disorder, cognitive disorder, breast cancer was evaluated by telemedicine today.  Patient today reports mood wise she is doing okay.  She denies any significant panic symptoms or skin picking problems at this time.  Her medications are helpful.  She reports sleep is overall okay on the Seroquel.  She is currently on a lower dosage of Seroquel and that helps.  Patient no longer takes Ambien.  Patient however reports she continues to struggle with memory changes.  She reports she continues to lose track of her thoughts, has difficulty following a conversation, has word finding difficulty.  She reports she never got a call from neuropsychological testing and is waiting for the same.  Patient denies any suicidality,  homicidality or perceptual disturbances.  Patient denies any other concerns today.  Visit Diagnosis:    ICD-10-CM   1. PTSD (post-traumatic stress disorder)  F43.10 busPIRone (BUSPAR) 15 MG tablet  2. 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 Panic disorder  F41.0 busPIRone (BUSPAR) 15 MG tablet  4. Social anxiety disorder  F40.10   5. H/O borderline personality disorder  Z86.59   6. Cognitive disorder  F09     Past Psychiatric History: I have reviewed past psychiatric history from my progress note on 03/10/2018.  Past trials of Paxil, Prozac, Zoloft, Lamictal, Depakote, Klonopin  Past Medical History:  Past Medical History:  Diagnosis Date  . Anxiety   . Arthritis   . Cancer [facility])    breast left  . Depression   . Fibromyalgia   . GERD (gastroesophageal reflux disease)   . Headache   . History of kidney stones    h/o  . Hypertension   . Hypothyroidism   . Thyroid disease     Past Surgical History:  Procedure Laterality Date  . APPENDECTOMY    . BREAST BIOPSY Left few yrs ago   stereotactic bx in [place], benign  . BREAST BIOPSY Left 12/26/2017   Affirm Bx IMC- X-Clip  . BREAST BIOPSY Left 01/14/2018   Affirm Bx- Coil clip- path pending  . BREAST RECONSTRUCTION WITH PLACEMENT OF TISSUE EXPANDER AND ALLODERM Left 04/08/2018   Procedure: LEFT BREAST IMMEDIATE  RECONSTRUCTION WITH EXPANDER AND FLEX HD;  Surgeon: Wallace Going, DO;  Location: [facility] OR[facility];  Service: Plastics;  Laterality: Left;  . COLONOSCOPY WITH PROPOFOL N/A 06/26/2018   Procedure: COLONOSCOPY WITH PROPOFOL;  Surgeon: Virgel Manifold, MD;  Location: [facility] END[facility];  Service: Endoscopy;  Laterality: N/A;  . ESOPHAGOGASTRODUODENOSCOPY (EGD) WITH PROPOFOL N/A 06/26/2018   Procedure: ESOPHAGOGASTRODUODENOSCOPY (EGD) WITH PROPOFOL;  Surgeon: Virgel Manifold, MD;  Location: [facility] ENDOSCOPY;  Service: Endoscopy;  Laterality: N/A;  . EYE SURGERY Bilateral    lasik  . FOOT BONE EXCISION     . IMAGE GUIDED SINUS SURGERY  10/220  . LIPOSUCTION WITH LIPOFILLING Left 04/30/2019   Procedure: Fat grafting to left breast;  Surgeon: Wallace Going, DO;  Location: [facility];  Service: Plastics;  Laterality: Left;  90 min, please  . MASTECTOMY Left 04/08/2019  . MASTECTOMY W/ SENTINEL NODE BIOPSY Left 04/08/2018   Procedure: MASTECTOMY WITH SENTINEL LYMPH NODE BIOPSY LEFT;  Surgeon: Fredirick Maudlin, MD;  Location: [facility];  Service: General;  Laterality: Left;  Marland Kitchen MASTOPEXY Right 08/04/2018   Procedure: MASTOPEXY;  Surgeon: Wallace Going, DO;  Location: [facility] OR[facility];  Service: Plastics;  Laterality: Right;  TOTAL CASE TIME SHOULD BE 3 HOURS, PLEASE  . REMOVAL OF TISSUE EXPANDER AND PLACEMENT OF IMPLANT Left 08/04/2018   Procedure: REMOVAL OF TISSUE EXPANDER AND PLACEMENT OF IMPLANT;  Surgeon: Wallace Going, DO;  Location: [facility];  Service: Plastics;  Laterality: Left;  . SCAR REVISION Left 04/30/2019   Procedure: release of scar contracture to left breast;  Surgeon: Wallace Going, DO;  Location: [facility];  Service: Plastics;  Laterality: Left;  . UMBILICAL HERNIA REPAIR  1983 and 1985    Family Psychiatric History: I have reviewed family psychiatric history from my progress note on 03/10/2018  Family History:  Family History  Problem Relation Age of Onset  . Hypertension Mother   . Lung cancer Maternal Grandmother   . Heart disease Father   . Alcohol abuse Father   . Breast cancer Neg Hx   . Colon cancer Neg Hx     Social History: I have reviewed social history from my progress note on 03/10/2018 Social History   Socioeconomic History  . Marital status: Divorced    Spouse name: Not on file  . Number of children: 1  . Years of education: Not on file  . Highest education level: High school graduate  Occupational History  . Not on file  Tobacco Use  . Smoking status: Former Smoker    Packs/day: 1.00    Years: 2.00     Pack years: 2.00    Types: Cigarettes    Quit date: 01/06/2017    Years since quitting: 2.8  . Smokeless tobacco: Never Used  Vaping Use  . Vaping Use: Former  . Quit date: 11/27/2016  Substance and Sexual Activity  . Alcohol use: Yes    Comment: rare beer   . Drug use: Not Currently  . Sexual activity: Not on file  Other Topics Concern  . 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 Quit smoking; ocassional alcohol.    Social Determinants of Health   Financial Resource Strain:   . Difficulty of Paying Living Expenses: Not on file  Food Insecurity:   . Worried About Charity fundraiser in the Last Year: Not on file  . Ran Out of Food in the Last Year: Not on file  Transportation Needs:   .  Lack of Transportation (Medical): Not on file  . Lack of Transportation (Non-Medical): Not on file  Physical Activity:   . Days of Exercise per Week: Not on file  . Minutes of Exercise per Session: Not on file  Stress:   . Feeling of Stress : Not on file  Social Connections:   . Frequency of Communication with Friends and Family: Not on file  . Frequency of Social Gatherings with Friends and Family: Not on file  . Attends Religious Services: Not on file  . Active Member of Clubs or Organizations: Not on file  . Attends Archivist Meetings: Not on file  . Marital Status: Not on file    Allergies:  Allergies  Allergen Reactions  . Tape Other (See Comments)    Paper tape after breast surgery/Burned skin   Tegaderm and other tape OK    Metabolic Disorder Labs: No results found for: HGBA1C, MPG No results found for: PROLACTIN No results found for: CHOL, TRIG, HDL, CHOLHDL, VLDL, LDLCALC No results found for: TSH  Therapeutic Level Labs: No results found for: LITHIUM No results found for: VALPROATE No components found for:  CBMZ  Current  Medications: Current Outpatient Medications  Medication Sig Dispense Refill  . atorvastatin (LIPITOR) 40 MG tablet Take 40 mg by mouth at bedtime.     Marland Kitchen BIOTIN PO Take 100 mcg by mouth daily.    . busPIRone (BUSPAR) 15 MG tablet Take 0.5 tablets (7.5 mg total) by mouth 2 (two) times daily. 90 tablet 0  . Calcium-Magnesium-Zinc (CAL-MAG-ZINC PO) Take 1 tablet by mouth daily in the afternoon.    . Chromium Picolinate (CHROMIUM PICOLATE PO) Take 1,000 mcg by mouth daily.    . citalopram (CELEXA) 40 MG tablet TAKE 1 TABLET BY MOUTH EVERY DAY IN THE MORNING 90 tablet 0  . Coenzyme Q10 (COQ10) 100 MG CAPS Take 100 mg by mouth every evening.    . cyclobenzaprine (FLEXERIL) 10 MG tablet 1/2-1 po bid prn    . doxazosin (CARDURA) 1 MG tablet Take 1 tablet (1 mg total) by mouth 2 (two) times daily. Needs office visit for further refills. Thank you! 60 tablet 0  . Flaxseed, Linseed, (FLAXSEED OIL PO) Take 1 capsule by mouth daily.    Marland Kitchen gabapentin (NEURONTIN) 600 MG tablet Take 1 tablet (600 mg total) by mouth as directed. TAKE HALF TABLET IN THE AM AND HALF TABLET AT LUNCH AND 1 TABLET( 600 MG ) AT BEDTIME 180 tablet 0  . GLUCOSAMINE-CHONDROITIN PO Take 2 tablets by mouth daily. 1500-1200    . hydrochlorothiazide (HYDRODIURIL) 25 MG tablet Take 25 mg by mouth daily.     . hydrOXYzine (VISTARIL) 50 MG capsule TAKE 1 CAPSULE BY MOUTH TWICE A DAY AS NEEDED FOR ANXIETY ATTACKS 60 capsule 2  . levothyroxine (SYNTHROID) 75 MCG tablet TAKE 1 TAB BY MOUTH EVERY OTHER DAY FOR THYROID (ALTERNATE 50MCG AND 75MCG)    . levothyroxine (SYNTHROID, LEVOTHROID) 50 MCG tablet Take 50 mcg by mouth every other day.     . losartan (COZAAR) 100 MG tablet Take 100 mg by mouth daily. for high blood pressure    . metoprolol (LOPRESSOR) 50 MG tablet Take 1 tablet (50 mg total) by mouth 2 (two) times daily. 60 tablet 0  . Multiple Vitamin (MULTIVITAMIN WITH MINERALS) TABS tablet Take 1 tablet by mouth daily.    . Omega-3 Fatty Acids  (FISH OIL PO) Take 1 capsule by mouth daily.    Marland Kitchen omeprazole (  PRILOSEC) 20 MG capsule Take 20 mg by mouth daily.     . Probiotic Product (PROBIOTIC DAILY PO) Take 2 capsules by mouth daily.    . clonazePAM (KLONOPIN) 0.5 MG tablet Take 0.5 mg by mouth 2 (two) times daily.    Marland Kitchen CLONAZEPAM PO Take by mouth.    . diclofenac (VOLTAREN) 50 MG EC tablet Take by mouth as directed.    . diclofenac (VOLTAREN) 75 MG EC tablet twice daily with food for 1 month, then decrease to 75 mg once daily for 1 month, then discontinue. No over the counter pain relievers except Tylenol take with food    . fluconazole (DIFLUCAN) 150 MG tablet Take 150 mg by mouth once.    . gabapentin (NEURONTIN) 300 MG capsule Take 300 mg by mouth daily. (Patient not taking: Reported on 11/09/2019)    . meloxicam (MOBIC) 15 MG tablet Take 15 mg by mouth daily. (Patient not taking: Reported on 11/09/2019)    . methylPREDNISolone (MEDROL DOSEPAK) 4 MG TBPK tablet Take by mouth as directed.    . naproxen (NAPROSYN) 500 MG tablet Take 500 mg by mouth 2 (two) times daily as needed for moderate pain.    Marland Kitchen ondansetron (ZOFRAN) 4 MG tablet Take 1 tablet (4 mg total) by mouth every 8 (eight) hours as needed for nausea or vomiting. 20 tablet 0  . QUEtiapine (SEROQUEL) 50 MG tablet Take 3 tablets (150 mg total) by mouth at bedtime. PRESCRIBED BY NEUROLOGY (Patient taking differently: Take 50 mg by mouth at bedtime. PRESCRIBED BY NEUROLOGY) 90 tablet 0   No current facility-administered medications for this visit.     Musculoskeletal: Strength & Muscle Tone: UTA Gait & Station: normal Patient leans: N/A  Psychiatric Specialty Exam: Review of Systems  Psychiatric/Behavioral: The patient is nervous/anxious.   All other systems reviewed and are negative.   There were no vitals taken for this visit.There is no height or weight on file to calculate BMI.  General Appearance: Casual  Eye Contact:  Fair  Speech:  Clear and Coherent  Volume:   Normal  Mood:  Anxious improving  Affect:  Congruent  Thought Process:  Goal Directed and Descriptions of Associations: Intact  Orientation:  Full (Time, Place, and Person)  Thought Content: Logical   Suicidal Thoughts:  No  Homicidal Thoughts:  No  Memory:  Immediate;   Fair Recent;   Fair Remote;   Fair reports short term memory loss  Judgement:  Fair  Insight:  Fair  Psychomotor Activity:  Normal  Concentration:  Concentration: Fair and Attention Span: Fair  Recall:  [employer] of Knowledge: Fair  Language: Fair  Akathisia:  No  Handed:  Right  AIMS (if indicated): UTA  Assets:  Communication Skills Desire for Improvement Housing Social Support  ADL's:  Intact  Cognition: Impaired,  Mild  Sleep:  Fair   Screenings:   Assessment and Plan: Saga Balthazar is a 59 year old Caucasian female, divorced, lives in [place], has a history of PTSD, skin picking disorder, panic attacks, social anxiety disorder, breast cancer status post mastectomy, chronic pain was evaluated by telemedicine today.  Patient is biologically predisposed given her family history, history of trauma and multiple health issues.  Patient with psychosocial stressors of the pandemic, death of her mother, financial problems, continues to struggle with cognitive issues.  Plan as noted below.  Plan PTSD-improving Celexa as prescribed Seroquel at reduced dosage of 150 mg p.o. nightly.  Prescribed by neurology. She is no  longer on Ambien. Continue CBT.  Social anxiety disorder-improving BuSpar 7.5 mg p.o. twice daily Hydroxyzine 50 mg p.o. twice daily as needed Gabapentin as prescribed  Skin picking disorder-improving BuSpar as prescribed Restart CBT  Cognitive disorder-unstable Patient referred for neuropsychological testing.  Pending Also discussed with patient about getting labs like thyroid panel, vitamin B12.  She will talk to her primary care provider.  She is currently on  levothyroxine.  Follow-up in clinic in 6 weeks or sooner if needed.  I have spent atleast 20 minutes face to face by video with patient today. More than 50 % of the time was spent for preparing to see the patient ( e.g., review of test, records ), ordering medications and test ,psychoeducation and supportive psychotherapy and care coordination,as well as documenting clinical information in electronic health record. This note was generated in part or whole with voice recognition software. Voice recognition is usually quite accurate but there are transcription errors that can and very often do occur. I apologize for any typographical errors that were not detected and corrected.         Ursula Alert, MD 11/10/2019, 8:59 AM

## 2019-11-11 ENCOUNTER — Other Ambulatory Visit: Payer: Self-pay

## 2019-11-11 ENCOUNTER — Ambulatory Visit (INDEPENDENT_AMBULATORY_CARE_PROVIDER_SITE_OTHER): Payer: Medicare Other | Admitting: Licensed Clinical Social Worker

## 2019-11-11 DIAGNOSIS — F4010 Social phobia, unspecified: Secondary | ICD-10-CM | POA: Diagnosis not present

## 2019-11-11 DIAGNOSIS — F4310 Post-traumatic stress disorder, unspecified: Secondary | ICD-10-CM

## 2019-11-11 NOTE — Progress Notes (Signed)
Virtual Visit via Video Note  I connected with Kelli Logan on 11/11/19 at 12:30 PM EDT by a video enabled telemedicine application and verified that I am speaking with the correct person using two identifiers.  Location: Patient: home Provider: remote office [place], [place])   I discussed the limitations of evaluation and management by telemedicine and the availability of in person appointments. The patient expressed understanding and agreed to proceed.  The patient was advised to call back or seek an in-person evaluation if the symptoms worsen or if the condition fails to improve as anticipated.  I provided 45 minutes of non-face-to-face time during this encounter.   Kellyann Ordway R Marlon Vonruden, LCSW    THERAPIST PROGRESS NOTE  Session Time: 12:30-1:15pm  Participation Level: Active  Behavioral Response: Neat and Well GroomedAlertDepressed  Type of Therapy: Individual Therapy  Treatment Goals addressed: Coping  Interventions: Supportive and Other: trauma-focused  Summary: [name] is a 59 y.o. female who presents with continuing symptoms related to diagnoses of: PTSD, depression, anxiety, excoriation, social anxiety, borderline personality disorder. Pt reports that she is concerned about recent cognitive changes (forgetting words, forgetting sentences, forgetting what she is doing). Pt reports mood has been stable and that she is managing stress and anxiety well.  Allowed pt to explore and express thoughts and feelings about multiple traumas throughout pts lifespan. Discussed relationships with family members, marriages, accidents, job-related changes and overall psychological impact.  Reviewed importance of self care and life balance. Pt continuing to enjoy spending time at [facility] daily.  Suicidal/Homicidal: No  SI, HI, or AVH reported at time of session.  Therapist Response: Michi continues to make good progress with self understanding and self insight. Pt  is able to identify traumatic triggers and is more proactive about anxiety management. Treatment showing good evolution and development.  Plan: Return again in 3 weeks. The ongoing treatment plan includes maintaining current levels of progress and continuing to build skills to manage mood, improve stress/anxiety management, emotion regulation, distress tolerance, and behavior modification.   Diagnosis: Axis I: Post Traumatic Stress Disorder and Social Anxiety    Axis II: Deferred    [place], LCSW 11/11/2019

## 2019-11-24 ENCOUNTER — Encounter: Payer: Self-pay | Admitting: Psychology

## 2019-11-24 ENCOUNTER — Ambulatory Visit: Payer: Medicare Other | Admitting: Plastic Surgery

## 2019-12-04 ENCOUNTER — Other Ambulatory Visit: Payer: Self-pay | Admitting: Psychiatry

## 2019-12-04 DIAGNOSIS — F424 Excoriation (skin-picking) disorder: Secondary | ICD-10-CM

## 2019-12-04 DIAGNOSIS — F410 Panic disorder [episodic paroxysmal anxiety] without agoraphobia: Secondary | ICD-10-CM

## 2019-12-07 ENCOUNTER — Ambulatory Visit (INDEPENDENT_AMBULATORY_CARE_PROVIDER_SITE_OTHER): Payer: Medicare Other | Admitting: Licensed Clinical Social Worker

## 2019-12-07 ENCOUNTER — Other Ambulatory Visit: Payer: Self-pay

## 2019-12-07 DIAGNOSIS — F4010 Social phobia, unspecified: Secondary | ICD-10-CM

## 2019-12-07 DIAGNOSIS — F4310 Post-traumatic stress disorder, unspecified: Secondary | ICD-10-CM

## 2019-12-07 NOTE — Progress Notes (Signed)
Virtual Visit via Video Note  I connected with Kelli Logan on 12/07/19 at  1:30 PM EST by a video enabled telemedicine application and verified that I am speaking with the correct person using two identifiers.  Location: Patient: home Provider: [facility]  Session Participants: Patient--Kelli Logan Counselor--Artur Winningham, MSW, LCSW   I discussed the limitations of evaluation and management by telemedicine and the availability of in person appointments. The patient expressed understanding and agreed to proceed.   The patient was advised to call back or seek an in-person evaluation if the symptoms worsen or if the condition fails to improve as anticipated.  I provided 60 minutes of non-face-to-face time during this encounter.   Eletha Culbertson R Daelyn Pettaway, LCSW    THERAPIST PROGRESS NOTE  Session Time: 1:30-2:30p  Participation Level: Active  Behavioral Response: NeatAlertAnxious and Depressed  Type of Therapy: Individual Therapy  Treatment Goals addressed: Anxiety and Coping  Interventions: CBT, Supportive and Other: chronic pain management  Summary: Kelli Logan is a 59 y.o. female who presents with improving symptoms related to PTSD and social anxiety diagnosis. Pt reports that she is managing moods well and that she is managing stress/anxiety well. Pt is compliant with medication.   Allowed pt to explore and express thoughts and feelings surrounding general life stressors: pt having some stress about joining a dating website/finding a partner. Allowed pt to explore previous relationships and overall relationship expectations.   Pt explored relationship with son and the dynamic of what would need to happen if he decides to visit.  Discussed/explored pts thoughts and feelings about multiple health-related concerns and chronic pain. Reviewed ways that pt is managing overall pain and other ways that pt can focus on wellness.  Suicidal/Homicidal: No  Therapist  Response: Kelli Logan is trying hard to implement interventions to help manage mood, stress, anxiety, and chronic pain.   Plan: Return again in 3 weeks.  Diagnosis: Axis I: Post Traumatic Stress Disorder and Social Anxiety    Axis II: No diagnosis    Rachel Bo Niva Murren, LCSW 12/07/2019

## 2019-12-16 ENCOUNTER — Other Ambulatory Visit: Payer: Self-pay | Admitting: Psychiatry

## 2019-12-16 DIAGNOSIS — F424 Excoriation (skin-picking) disorder: Secondary | ICD-10-CM

## 2019-12-16 DIAGNOSIS — F4010 Social phobia, unspecified: Secondary | ICD-10-CM

## 2019-12-16 DIAGNOSIS — F410 Panic disorder [episodic paroxysmal anxiety] without agoraphobia: Secondary | ICD-10-CM

## 2019-12-16 DIAGNOSIS — F4310 Post-traumatic stress disorder, unspecified: Secondary | ICD-10-CM

## 2019-12-22 ENCOUNTER — Telehealth (INDEPENDENT_AMBULATORY_CARE_PROVIDER_SITE_OTHER): Payer: Medicare Other | Admitting: Psychiatry

## 2019-12-22 ENCOUNTER — Other Ambulatory Visit: Payer: Self-pay

## 2019-12-22 ENCOUNTER — Encounter: Payer: Self-pay | Admitting: Psychiatry

## 2019-12-22 DIAGNOSIS — F4310 Post-traumatic stress disorder, unspecified: Secondary | ICD-10-CM

## 2019-12-22 DIAGNOSIS — F424 Excoriation (skin-picking) disorder: Secondary | ICD-10-CM | POA: Diagnosis not present

## 2019-12-22 DIAGNOSIS — F4010 Social phobia, unspecified: Secondary | ICD-10-CM

## 2019-12-22 DIAGNOSIS — F410 Panic disorder [episodic paroxysmal anxiety] without agoraphobia: Secondary | ICD-10-CM | POA: Diagnosis not present

## 2019-12-22 DIAGNOSIS — F09 Unspecified mental disorder due to known physiological condition: Secondary | ICD-10-CM

## 2019-12-22 DIAGNOSIS — Z8659 Personal history of other mental and behavioral disorders: Secondary | ICD-10-CM

## 2019-12-22 NOTE — Progress Notes (Signed)
Virtual Visit via Video Note  I connected with Kelli Logan on 12/22/19 at  2:40 PM EST by a video enabled telemedicine application and verified that I am speaking with the correct person using two identifiers.  Location Provider Location : [facility] Patient Location : [facility]  Participants: Patient , Provider   I discussed the limitations of evaluation and management by telemedicine and the availability of in person appointments. The patient expressed understanding and agreed to proceed.   I discussed the assessment and treatment plan with the patient. The patient was provided an opportunity to ask questions and all were answered. The patient agreed with the plan and demonstrated an understanding of the instructions.   The patient was advised to call back or seek an in-person evaluation if the symptoms worsen or if the condition fails to improve as anticipated.   [place] MD OP Progress Note  12/22/2019 3:32 PM Kelli Logan  MRN:  [id number]  Chief Complaint:  Chief Complaint    Follow-up     HPI: Kelli Logan is a 59 year old Caucasian female, divorced, lives in [place], has a history of PTSD, skin picking disorder, social anxiety, history of borderline personality disorder, cognitive disorder, breast cancer was evaluated by telemedicine today.  Patient today reports she continues to struggle with a lot of pain of her hip joint. She has total hip replacement scheduled. She is hoping that will help with her current pain problems. She rates her pain at a 6 out of 10, 10 being the worst most days.  Patient reports her medications were recently readjusted by her neurologist. She reports she was taken off of the Seroquel since she was having increased appetite and was eating a lot of sugary snacks. She reports she is currently on Abilify 20 mg which was started couple of weeks ago. So far she is tolerating the Abilify well. Since being off of the Seroquel she has been able to cut  back on eating too much sugar and that has helped her with her skin picking and her anxiety. She reports when she takes a lot of sugary food her anxiety and skin picking increases.  She reports she accidentally stopped taking BuSpar for 2 weeks and noticed that it has not made a big difference. She hence wants to stop the BuSpar. She reports her long-term plan is to get off of some of these medications.  She reports sleep is fair.  She denies any suicidality, homicidality or perceptual disturbances.  Patient denies any other concerns today.  Visit Diagnosis:    ICD-10-CM   1. PTSD (post-traumatic stress disorder)  F43.10   2. Skin-picking disorder  F42.4   3. Panic disorder  F41.0   4. Social anxiety disorder  F40.10   5. Cognitive disorder  F09   6. H/O borderline personality disorder  Z86.59     Past Psychiatric History: I have reviewed past psychiatric history from my progress note on 03/10/2018. Past trials of Paxil, Prozac, Zoloft, Lamictal, Depakote, Klonopin, Seroquel  Past Medical History:  Past Medical History:  Diagnosis Date  . Anxiety   . Arthritis   . Cancer [facility])    breast left  . Depression   . Fibromyalgia   . GERD (gastroesophageal reflux disease)   . Headache   . History of kidney stones    h/o  . Hypertension   . Hypothyroidism   . Thyroid disease     Past Surgical History:  Procedure Laterality Date  . APPENDECTOMY    .  BREAST BIOPSY Left few yrs ago   stereotactic bx in [place], benign  . BREAST BIOPSY Left 12/26/2017   Affirm Bx [facility]MC- X-Clip  . BREAST BIOPSY Left 01/14/2018   Affirm Bx- Coil clip- path pending  . BREAST RECONSTRUCTION WITH PLACEMENT OF TISSUE EXPANDER AND ALLODERM Left 04/08/2018   Procedure: LEFT BREAST IMMEDIATE  RECONSTRUCTION WITH EXPANDER AND FLEX HD;  Surgeon: Wallace Going, DO;  Location: [facility] ORS;  Service: Plastics;  Laterality: Left;  . COLONOSCOPY WITH PROPOFOL N/A 06/26/2018   Procedure: COLONOSCOPY WITH PROPOFOL;   Surgeon: Virgel Manifold, MD;  Location: [facility] END[facility];  Service: Endoscopy;  Laterality: N/A;  . ESOPHAGOGASTRODUODENOSCOPY (EGD) WITH PROPOFOL N/A 06/26/2018   Procedure: ESOPHAGOGASTRODUODENOSCOPY (EGD) WITH PROPOFOL;  Surgeon: Virgel Manifold, MD;  Location: [facility] ENDOS[facility];  Service: Endoscopy;  Laterality: N/A;  . EYE SURGERY Bilateral    lasik  . FOOT BONE EXCISION    . IMAGE GUIDED SINUS SURGERY  10/220  . LIPOSUCTION WITH LIPOFILLING Left 04/30/2019   Procedure: Fat grafting to left breast;  Surgeon: Wallace Going, DO;  Location: [place];  Service: Plastics;  Laterality: Left;  90 min, please  . MASTECTOMY Left 04/08/2019  . MASTECTOMY W/ SENTINEL NODE BIOPSY Left 04/08/2018   Procedure: MASTECTOMY WITH SENTINEL LYMPH NODE BIOPSY LEFT;  Surgeon: Fredirick Maudlin, MD;  Location: [facility];  Service: General;  Laterality: Left;  Marland Kitchen MASTOPEXY Right 08/04/2018   Procedure: MASTOPEXY;  Surgeon: Wallace Going, DO;  Location: [facility];  Service: Plastics;  Laterality: Right;  TOTAL CASE TIME SHOULD BE 3 HOURS, PLEASE  . REMOVAL OF TISSUE EXPANDER AND PLACEMENT OF IMPLANT Left 08/04/2018   Procedure: REMOVAL OF TISSUE EXPANDER AND PLACEMENT OF IMPLANT;  Surgeon: Wallace Going, DO;  Location: [facility];  Service: Plastics;  Laterality: Left;  . SCAR REVISION Left 04/30/2019   Procedure: release of scar contracture to left breast;  Surgeon: Wallace Going, DO;  Location: [place];  Service: Plastics;  Laterality: Left;  . UMBILICAL HERNIA REPAIR  1983 and 1985    Family Psychiatric History: I have reviewed family psychiatric history from my progress note on 03/10/2018  Family History:  Family History  Problem Relation Age of Onset  . Hypertension Mother   . Lung cancer Maternal Grandmother   . Heart disease Father   . Alcohol abuse Father   . Breast cancer Neg Hx   . Colon cancer Neg Hx     Social History: Reviewed social  history from my progress note on 03/10/2018 Social History   Socioeconomic History  . Marital status: Divorced    Spouse name: Not on file  . Number of children: 1  . Years of education: Not on file  . Highest education level: High school graduate  Occupational History  . Not on file  Tobacco Use  . Smoking status: Former Smoker    Packs/day: 1.00    Years: 2.00    Pack years: 2.00    Types: Cigarettes    Quit date: 01/06/2017    Years since quitting: 2.9  . Smokeless tobacco: Never Used  Vaping Use  . Vaping Use: Former  . Quit date: 11/27/2016  Substance and Sexual Activity  . Alcohol use: Yes    Comment: rare beer   . Drug use: Not Currently  . Sexual activity: Not on file  Other Topics Concern  . Not on file  Social History Narrative   Home on disability [  pysche problems]; transportation issues; worked in Landscape [employer]. 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 Quit smoking; ocassional alcohol.    Social Determinants of Health   Financial Resource Strain: Not on file  Food Insecurity: Not on file  Transportation Needs: Not on file  Physical Activity: Not on file  Stress: Not on file  Social Connections: Not on file    Allergies:  Allergies  Allergen Reactions  . Tape Other (See Comments)    Paper tape after breast surgery/Burned skin   Tegaderm and other tape OK    Metabolic Disorder Labs: No results found for: HGBA1C, MPG No results found for: PROLACTIN No results found for: CHOL, TRIG, HDL, CHOLHDL, VLDL, LDLCALC No results found for: TSH  Therapeutic Level Labs: No results found for: LITHIUM No results found for: VALPROATE No components found for:  CBMZ  Current Medications: Current Outpatient Medications  Medication Sig Dispense Refill  . ARIPiprazole (ABILIFY) 20 MG tablet Take 1/2 tab twice a day    . ipratropium (ATROVENT) 0.03 % nasal spray     . atorvastatin (LIPITOR) 40 MG tablet Take 40 mg by mouth at bedtime.     Marland Kitchen BIOTIN PO Take 100 mcg  by mouth daily.    . Calcium-Magnesium-Zinc (CAL-MAG-ZINC PO) Take 1 tablet by mouth daily in the afternoon.    . Chromium Picolinate (CHROMIUM PICOLATE PO) Take 1,000 mcg by mouth daily.    . citalopram (CELEXA) 40 MG tablet TAKE 1 TABLET BY MOUTH EVERY DAY IN THE MORNING 90 tablet 0  . Coenzyme Q10 (COQ10) 100 MG CAPS Take 100 mg by mouth every evening.    . cyclobenzaprine (FLEXERIL) 10 MG tablet 1/2-1 po bid prn    . diclofenac Sodium (VOLTAREN) 1 % GEL Apply 1 application topically 4 (four) times daily.    Marland Kitchen doxazosin (CARDURA) 1 MG tablet Take 1 tablet (1 mg total) by mouth 2 (two) times daily. Needs office visit for further refills. Thank you! 60 tablet 0  . Flaxseed, Linseed, (FLAXSEED OIL PO) Take 1 capsule by mouth daily.    Marland Kitchen gabapentin (NEURONTIN) 300 MG capsule Take 300 mg by mouth daily. (Patient not taking: Reported on 11/09/2019)    . gabapentin (NEURONTIN) 600 MG tablet Take 1 tablet (600 mg total) by mouth as directed. TAKE HALF TABLET IN THE AM AND HALF TABLET AT LUNCH AND 1 TABLET( 600 MG ) AT BEDTIME 180 tablet 0  . GLUCOSAMINE-CHONDROITIN PO Take 2 tablets by mouth daily. 1500-1200    . hydrochlorothiazide (HYDRODIURIL) 25 MG tablet Take 25 mg by mouth daily.     . hydrOXYzine (VISTARIL) 50 MG capsule TAKE 1 CAPSULE BY MOUTH TWICE A DAY AS NEEDED FOR ANXIETY ATTACKS 180 capsule 1  . levothyroxine (SYNTHROID) 75 MCG tablet TAKE 1 TAB BY MOUTH EVERY OTHER DAY FOR THYROID (ALTERNATE 50MCG AND 75MCG)    . levothyroxine (SYNTHROID, LEVOTHROID) 50 MCG tablet Take 50 mcg by mouth every other day.     . losartan (COZAAR) 100 MG tablet Take 100 mg by mouth daily. for high blood pressure    . meloxicam (MOBIC) 15 MG tablet Take 15 mg by mouth daily. (Patient not taking: Reported on 11/09/2019)    . metoprolol (LOPRESSOR) 50 MG tablet Take 1 tablet (50 mg total) by mouth 2 (two) times daily. 60 tablet 0  . Multiple Vitamin (MULTIVITAMIN WITH MINERALS) TABS tablet Take 1 tablet by mouth  daily.    . Omega-3 Fatty Acids (FISH OIL PO) Take 1 capsule  by mouth daily.    Marland Kitchen omeprazole (PRILOSEC) 20 MG capsule Take 20 mg by mouth daily.     . Probiotic Product (PROBIOTIC DAILY PO) Take 2 capsules by mouth daily.     No current facility-administered medications for this visit.     Musculoskeletal: Strength & Muscle Tone: UTA Gait & Station: UTA Patient leans: N/A  Psychiatric Specialty Exam: Review of Systems  Musculoskeletal:       Back pain, left sided hip joint pain  Skin:       Reports skin lesions from picking on her LE - healing - patient declines showing it to writer , reports she will send a picture   Psychiatric/Behavioral: Negative for agitation, behavioral problems, confusion, dysphoric mood, hallucinations and suicidal ideas. The patient is not hyperactive.   All other systems reviewed and are negative.   There were no vitals taken for this visit.There is no height or weight on file to calculate BMI.  General Appearance: Casual  Eye Contact:  Fair  Speech:  Clear and Coherent  Volume:  Normal  Mood:  Euthymic  Affect:  Congruent  Thought Process:  Goal Directed and Descriptions of Associations: Intact  Orientation:  Full (Time, Place, and Person)  Thought Content: Logical   Suicidal Thoughts:  No  Homicidal Thoughts:  No  Memory:  Immediate;   Fair Recent;   Fair Remote;   Fair  Judgement:  Fair  Insight:  Fair  Psychomotor Activity:  Normal  Concentration:  Concentration: Fair and Attention Span: Fair  Recall:  [employer] of Knowledge: Fair  Language: Fair  Akathisia:  No  Handed:  Right  AIMS (if indicated): UTA  Assets:  Communication Skills Desire for Improvement Housing Social Support  ADL's:  Intact  Cognition: Impaired,  Mild  Sleep:  Fair   Screenings:   Assessment and Plan: Kelli Logan is a 59 year old Caucasian female, divorced, lives in [place], has a history of PTSD, skin picking disorder, panic attacks, social anxiety  disorder, breast cancer status post mastectomy, chronic pain was evaluated by telemedicine today. Patient is biologically predisposed given her family history, history of trauma, multiple health issues. Patient with psychosocial stressors of the pandemic, death of her mother, financial problems, is currently making progress on the current medication regimen. Plan as noted below.  Plan PTSD-improving Celexa as prescribed Discontinue Seroquel-recently changed to Abilify by neurologist. Continue Abilify 20 mg p.o. daily. Continue CBT as needed   Social anxiety disorder-improving Discontinue BuSpar for noncompliance Gabapentin as prescribed Hydroxyzine 50 mg p.o. twice daily as needed  Skin picking disorder-improving Patient reports skin picking is improved. We will monitor closely Patient was advised to restart CBT last visit-pending  Cognitive disorder-unstable Patient was referred for neuropsychological testing-pending- has upcoming appointment on 12/23/2019 Patient was also advised to get labs including TSH, vitamin B12. Patient agrees to sign a release to obtain medical records, send the most recent labs to Probation officer.  Since she is on Abilify she will also benefit from lipid panel, hemoglobin A1c, prolactin as well as EKG to monitor QTC. This was discussed with patient and she agrees to get it done with her primary care office.  Follow-up in clinic in 1 month or sooner if needed.  I have spent atleast 20 minutes face to face by video with patient today. More than 50 % of the time was spent for preparing to see the patient ( e.g., review of test, records ), obtaining and to review and separately obtained  history , ordering medications and test ,psychoeducation and supportive psychotherapy and care coordination,as well as documenting clinical information in electronic health record,interpreting and communication of test results This note was generated in part or whole with voice recognition  software. Voice recognition is usually quite accurate but there are transcription errors that can and very often do occur. I apologize for any typographical errors that were not detected and corrected.       Ursula Alert, MD 12/22/2019, 3:32 PM

## 2019-12-23 ENCOUNTER — Encounter: Payer: Medicare Other | Attending: Psychology | Admitting: Psychology

## 2019-12-23 ENCOUNTER — Other Ambulatory Visit: Payer: Self-pay

## 2019-12-23 ENCOUNTER — Encounter: Payer: Self-pay | Admitting: Psychology

## 2019-12-23 DIAGNOSIS — R4189 Other symptoms and signs involving cognitive functions and awareness: Secondary | ICD-10-CM | POA: Diagnosis not present

## 2019-12-23 DIAGNOSIS — R413 Other amnesia: Secondary | ICD-10-CM
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
## 2019-12-24 ENCOUNTER — Telehealth: Payer: Self-pay

## 2019-12-24 DIAGNOSIS — F4310 Post-traumatic stress disorder, unspecified: Secondary | ICD-10-CM

## 2019-12-24 MED ORDER — HYDROXYZINE PAMOATE 50 MG PO CAPS: 50.0000 mg | ORAL_CAPSULE | Freq: Every day | ORAL | 0 refills | Status: DC | PRN

## 2019-12-24 NOTE — Telephone Encounter (Signed)
Returned call to patient.  She reports the hydroxyzine is making her drowsy during the day.  She would prefer not to take any hydroxyzine during the day at all.  Discussed to stay on the hydroxyzine 50 mg at bedtime.  She does have 25 mg available at home from a previous prescription which she reports she can try during the day as needed.  She will reach out to writer if she has more concerns.

## 2019-12-24 NOTE — Telephone Encounter (Signed)
Medication problem - Patient called stating she was doing well with taking Hydroxyzine 50 mg at bedtime but when she takes that much in the mornings it makes her feel "high" and not herself.  Questions if she can just take 25 mg or none in the mornings.

## 2019-12-31 ENCOUNTER — Ambulatory Visit (INDEPENDENT_AMBULATORY_CARE_PROVIDER_SITE_OTHER): Payer: Medicare Other | Admitting: Licensed Clinical Social Worker

## 2019-12-31 ENCOUNTER — Other Ambulatory Visit: Payer: Self-pay

## 2019-12-31 DIAGNOSIS — F4310 Post-traumatic stress disorder, unspecified: Secondary | ICD-10-CM

## 2019-12-31 DIAGNOSIS — F424 Excoriation (skin-picking) disorder: Secondary | ICD-10-CM

## 2019-12-31 DIAGNOSIS — F410 Panic disorder [episodic paroxysmal anxiety] without agoraphobia: Secondary | ICD-10-CM

## 2019-12-31 NOTE — Progress Notes (Signed)
Virtual Visit via Video Note  I connected with Kelli Logan on 12/31/19 at  2:30 PM EST by a video enabled telemedicine application and verified that I am speaking with the correct person using two identifiers.  Location: Patient: home/[employer] Provider: remote office [place], [place])   I discussed the limitations of evaluation and management by telemedicine and the availability of in person appointments. The patient expressed understanding and agreed to proceed.  I discussed the assessment and treatment plan with the patient. The patient was provided an opportunity to ask questions and all were answered. The patient agreed with the plan and demonstrated an understanding of the instructions.   The patient was advised to call back or seek an in-person evaluation if the symptoms worsen or if the condition fails to improve as anticipated.  I provided 60  minutes of non-face-to-face time during this encounter.   Kelli Logan R Artesha Wemhoff, LCSW    THERAPIST PROGRESS NOTE  Session Time: 2:30-3:30p  Participation Level: Active  Behavioral Response: NAAlertAnxious  Type of Therapy: Individual Therapy  Treatment Goals addressed: Anxiety  Interventions: Solution Focused and Supportive  Summary: Kelli Logan is a 59 y.o. female who presents with continuing symptoms associated with PTSD, excoriation disorder, and panic disorder. Pt reports that she is compliant with medication and is still adjusting from medication change with no negative side effects other than some appetite decrease.  Pt reports a decrease in excoriation behaviors (leg picking).   Allowed pt to explore and express thoughts and feelings associated with current life circumstances.  Pt reports that she is having some anxiety over upcoming hip replacement surgery on Feb 3rd.  Pt expressed other health-related concerns that she is currently dealing with: memory issues, body pain. Pt has complex medical history and lots  of appointments each month. Pt reported concerns about swallowing issues.   Discussed current financial concerns and helped pt to identify areas where she was in control of spending versus bills that have to be paid. Pt admits that she will go online and often overspend/borrow money to make purchases and then have to pay extra fees to cover expense of "borrowing". Discussed food needs--pt does have money for food "I get by".    Discussed pts current dating website experiences--had one person state that she was "negative" and it upset pt. Conversation triggered thoughts and feelings about ex husband--pt explored their relationship/breakup and how feelings often get triggered around this time of year. Pt discussed relationship with son a bit and then mentioned that she doesn't really celebrate Christmas because she doesn't believe in the holiday. Pt states that a neighbor invited her to come to his house to celebrate with them. "I don't really want to go because I don't have a gift and don't have the money to get a gift".   Gave pt unconditional positive support and encouraged pt to continue focusing on self care, managing impulsive behaviors, positive social engagement.    Suicidal/Homicidal: No  Therapist Response: Kelli Logan is demonstrating a growing capacity for greater management of impulsive behaviors, capacity for self care and life management, and positive social engagement as evidenced by self report. This is indicative of overall progress. Treatment showing good evolution and development.   Plan: Return again in 3 weeks.  Diagnosis: Axis I: Panic Disorder, Post Traumatic Stress Disorder and excoriation    Axis II: No diagnosis    Rachel Bo Rhegan Trunnell, LCSW 12/31/2019

## 2020-01-05 ENCOUNTER — Encounter: Payer: Medicare Other | Admitting: Surgical

## 2020-01-12 ENCOUNTER — Ambulatory Visit: Payer: Medicare Other | Admitting: Student in an Organized Health Care Education/Training Program

## 2020-01-19 ENCOUNTER — Telehealth: Payer: Medicare Other | Admitting: Plastic Surgery

## 2020-01-21 ENCOUNTER — Ambulatory Visit: Payer: Medicare Other | Admitting: Student in an Organized Health Care Education/Training Program

## 2020-01-22 ENCOUNTER — Other Ambulatory Visit: Payer: Self-pay

## 2020-01-22 ENCOUNTER — Encounter: Payer: Medicare Other | Attending: Psychology | Admitting: Psychology

## 2020-01-22 ENCOUNTER — Encounter: Payer: Self-pay | Admitting: Psychology

## 2020-01-22 DIAGNOSIS — Z8659 Personal history of other mental and behavioral disorders: Secondary | ICD-10-CM | POA: Insufficient documentation

## 2020-01-22 DIAGNOSIS — R4189 Other symptoms and signs involving cognitive functions and awareness: Secondary | ICD-10-CM | POA: Diagnosis present

## 2020-01-22 DIAGNOSIS — F688 Other specified disorders of adult personality and behavior: Secondary | ICD-10-CM | POA: Insufficient documentation

## 2020-01-22 DIAGNOSIS — R413 Other amnesia: Secondary | ICD-10-CM | POA: Diagnosis present

## 2020-01-22 DIAGNOSIS — F908 Attention-deficit hyperactivity disorder, other type: Secondary | ICD-10-CM | POA: Diagnosis present

## 2020-01-22 DIAGNOSIS — F4312 Post-traumatic stress disorder, chronic: Secondary | ICD-10-CM | POA: Diagnosis present

## 2020-01-22 DIAGNOSIS — F3130 Bipolar disorder, current episode depressed, mild or moderate severity, unspecified: Secondary | ICD-10-CM | POA: Diagnosis present

## 2020-01-22 DIAGNOSIS — F423 Hoarding disorder: Secondary | ICD-10-CM | POA: Insufficient documentation

## 2020-01-22 DIAGNOSIS — F424 Excoriation (skin-picking) disorder: Secondary | ICD-10-CM | POA: Diagnosis present

## 2020-01-22 NOTE — Progress Notes (Signed)
   Neuropsychology Note  Clorinda Wyble Wolgamott arrived early to her 13:00 testing appointment. She completed 240 minutes of neuropsychological testing with this provider.   Clinical Decision Making: In considering the patient's current level of functioning, level of presumed impairment, nature of symptoms, emotional and behavioral responses during the interview, level of literacy, and observed level of motivation/effort, a battery of tests was selected. hanges were made as deemed necessary based on patient performance on testing, technician observations and additional pertinent factors such as those listed above.  Behavioral Observations:  Appearance: Bizarre with adequate hygiene. Gait: Ambulated independently without assistance. No gross motor abnormalities  Speech: Clear, increased rate, normal tone & high volume. Mild WFD. Neologism.  Thought process:  Disorganized, loose associations, tangential, impulsive and perseverative.  Mood/Affect: Anxious and depressed, blunted .  Interpersonal: immature and disinhibited . Orientation: Oriented x 4  Effort/Motivation: Good   She did not appear to have difficulty seeing, hearing, or understanding test instructions. She did display some inattention but did not require much additional prompting.  The patient did not appear overtly distressed by the testing session, per behavioral observation or via self-report. he exhibited good distress tolerance on questions she did not know or tasks that were more difficult.  She was highly cooperative with all assigned tasks and gave good effort. Rest breaks were offered.  Tests Administered:  Animal Naming   [employer], 3rd Edition (CVLT-3); Standard Form  Controlled Oral Word Association (COWA)  Rey-O Complex Figure Test (RCFT)  Wechsler Adult Intelligence Scale, 4th Edition (WAIS-IV)  Wechsler Memory Intelligence Scale, 4th Edition (WAIS-IV), Select subtests   [employer] Test (WCST)   Results:  To be included once scored   Jeanett Schlein Haberkorn will return for interactive feedback session with this provider (To be scheduled) at which time her test performances, clinical impressions and treatment recommendations will be reviewed in detail. The patient understands she can contact our office should she require our assistance before this time. Patient reportedly anticipates having hip replacement surgery in February unsure of availability at present. She will call back to schedule feedback appointment. She also agreed to complete objective personality assessment (MMPI-II) remotely (secure portal operated by [facility].) before feedback appointment.   Full report to follow.

## 2020-01-23 ENCOUNTER — Other Ambulatory Visit (HOSPITAL_COMMUNITY): Payer: Medicare Other

## 2020-01-26 ENCOUNTER — Other Ambulatory Visit: Payer: Self-pay

## 2020-01-26 ENCOUNTER — Encounter: Payer: Self-pay | Admitting: Psychology

## 2020-01-26 ENCOUNTER — Encounter (HOSPITAL_BASED_OUTPATIENT_CLINIC_OR_DEPARTMENT_OTHER): Payer: Medicare Other | Admitting: Psychology

## 2020-01-26 DIAGNOSIS — F3130 Bipolar disorder, current episode depressed, mild or moderate severity, unspecified: Secondary | ICD-10-CM

## 2020-01-26 DIAGNOSIS — R4189 Other symptoms and signs involving cognitive functions and awareness: Secondary | ICD-10-CM | POA: Diagnosis not present

## 2020-01-26 DIAGNOSIS — F423 Hoarding disorder: Secondary | ICD-10-CM

## 2020-01-26 DIAGNOSIS — Z8659 Personal history of other mental and behavioral disorders: Secondary | ICD-10-CM

## 2020-01-26 DIAGNOSIS — F688 Other specified disorders of adult personality and behavior: Secondary | ICD-10-CM

## 2020-01-26 DIAGNOSIS — F4312 Post-traumatic stress disorder, chronic: Secondary | ICD-10-CM

## 2020-01-26 DIAGNOSIS — F908 Attention-deficit hyperactivity disorder, other type: Secondary | ICD-10-CM

## 2020-01-26 DIAGNOSIS — F424 Excoriation (skin-picking) disorder: Secondary | ICD-10-CM
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
## 2020-01-27 ENCOUNTER — Encounter (HOSPITAL_BASED_OUTPATIENT_CLINIC_OR_DEPARTMENT_OTHER): Payer: Self-pay

## 2020-01-27 ENCOUNTER — Ambulatory Visit (HOSPITAL_BASED_OUTPATIENT_CLINIC_OR_DEPARTMENT_OTHER): Admit: 2020-01-27 | Payer: Medicare Other | Admitting: Plastic Surgery

## 2020-01-27 SURGERY — LIPOSUCTION, WITH FAT TRANSFER
Anesthesia: General | Site: Breast | Laterality: Right

## 2020-02-02 ENCOUNTER — Other Ambulatory Visit: Payer: Self-pay | Admitting: Psychiatry

## 2020-02-02 ENCOUNTER — Ambulatory Visit (INDEPENDENT_AMBULATORY_CARE_PROVIDER_SITE_OTHER): Payer: Medicare Other | Admitting: Licensed Clinical Social Worker

## 2020-02-02 ENCOUNTER — Other Ambulatory Visit: Payer: Self-pay

## 2020-02-02 ENCOUNTER — Ambulatory Visit: Payer: Medicare Other | Admitting: Psychology

## 2020-02-02 DIAGNOSIS — F4310 Post-traumatic stress disorder, unspecified: Secondary | ICD-10-CM | POA: Diagnosis not present

## 2020-02-02 DIAGNOSIS — F424 Excoriation (skin-picking) disorder: Secondary | ICD-10-CM

## 2020-02-02 DIAGNOSIS — F410 Panic disorder [episodic paroxysmal anxiety] without agoraphobia: Secondary | ICD-10-CM
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
## 2020-02-03 ENCOUNTER — Other Ambulatory Visit: Payer: Self-pay | Admitting: Orthopedic Surgery

## 2020-02-04 ENCOUNTER — Ambulatory Visit: Payer: Medicare Other | Admitting: Student in an Organized Health Care Education/Training Program

## 2020-02-05 ENCOUNTER — Encounter: Payer: Medicare Other | Admitting: Plastic Surgery

## 2020-02-05 ENCOUNTER — Inpatient Hospital Stay: Admission: RE | Admit: 2020-02-05 | Payer: Medicare Other | Source: Ambulatory Visit

## 2020-02-05 ENCOUNTER — Encounter
Admission: RE | Admit: 2020-02-05 | Discharge: 2020-02-05 | Disposition: A | Discharge status: Home / Self Care | Payer: Medicare Other | Source: Ambulatory Visit | Attending: Orthopedic Surgery | Admitting: Orthopedic Surgery

## 2020-02-05 ENCOUNTER — Other Ambulatory Visit: Payer: Self-pay

## 2020-02-05 DIAGNOSIS — Z01818 Encounter for other preprocedural examination: Secondary | ICD-10-CM | POA: Diagnosis not present

## 2020-02-05 HISTORY — DX: Sleep apnea, unspecified: G47.30

## 2020-02-05 LAB — URINALYSIS, ROUTINE W REFLEX MICROSCOPIC
Bilirubin Urine: NEGATIVE
Glucose, UA: NEGATIVE mg/dL
Hgb urine dipstick: NEGATIVE
Ketones, ur: NEGATIVE mg/dL
Leukocytes,Ua: NEGATIVE
Nitrite: NEGATIVE
Protein, ur: NEGATIVE mg/dL
Specific Gravity, Urine: 1.011 (ref 1.005–1.030)
pH: 7 (ref 5.0–8.0)

## 2020-02-05 LAB — CBC WITH DIFFERENTIAL/PLATELET
Abs Immature Granulocytes: 0.01 10*3/uL (ref 0.00–0.07)
Basophils Absolute: 0.1 10*3/uL (ref 0.0–0.1)
Basophils Relative: 1 %
Eosinophils Absolute: 0.4 10*3/uL (ref 0.0–0.5)
Eosinophils Relative: 7 %
HCT: 37.3 % (ref 36.0–46.0)
Hemoglobin: 12.6 g/dL (ref 12.0–15.0)
Immature Granulocytes: 0 %
Lymphocytes Relative: 31 %
Lymphs Abs: 1.8 10*3/uL (ref 0.7–4.0)
MCH: 28.5 pg (ref 26.0–34.0)
MCHC: 33.8 g/dL (ref 30.0–36.0)
MCV: 84.4 fL (ref 80.0–100.0)
Monocytes Absolute: 0.6 10*3/uL (ref 0.1–1.0)
Monocytes Relative: 11 %
Neutro Abs: 2.9 10*3/uL (ref 1.7–7.7)
Neutrophils Relative %: 50 %
Platelets: 311 10*3/uL (ref 150–400)
RBC: 4.42 MIL/uL (ref 3.87–5.11)
RDW: 12.1 % (ref 11.5–15.5)
WBC: 5.8 10*3/uL (ref 4.0–10.5)
nRBC: 0 % (ref 0.0–0.2)

## 2020-02-05 LAB — TYPE AND SCREEN
ABO/RH(D): A NEG
Antibody Screen: NEGATIVE

## 2020-02-05 LAB — COMPREHENSIVE METABOLIC PANEL
ALT: 25 U/L (ref 0–44)
AST: 24 U/L (ref 15–41)
Albumin: 3.7 g/dL (ref 3.5–5.0)
Alkaline Phosphatase: 62 U/L (ref 38–126)
Anion gap: 9 (ref 5–15)
BUN: 12 mg/dL (ref 6–20)
CO2: 27 mmol/L (ref 22–32)
Calcium: 9.2 mg/dL (ref 8.9–10.3)
Chloride: 97 mmol/L — ABNORMAL LOW (ref 98–111)
Creatinine, Ser: 0.87 mg/dL (ref 0.44–1.00)
GFR, Estimated: >60 mL/min (ref >60)
Glucose, Bld: 92 mg/dL (ref 70–99)
Potassium: 4 mmol/L (ref 3.5–5.1)
Sodium: 133 mmol/L — ABNORMAL LOW (ref 135–145)
Total Bilirubin: 0.9 mg/dL (ref 0.3–1.2)
Total Protein: 6.5 g/dL (ref 6.5–8.1)

## 2020-02-05 LAB — SURGICAL PCR SCREEN
MRSA, PCR: NEGATIVE
Staphylococcus aureus: NEGATIVE

## 2020-02-05 NOTE — Patient Instructions (Addendum)
Your procedure is scheduled on:02-11-20 THURSDAY Report to the Registration Desk on the 1st floor of the [facility]-Then proceed to the 2nd floor Surgery Desk in the [place] To find out your arrival time, please call [phone] between 1PM - 3PM on:02-10-20 WEDNESDAY  REMEMBER: Instructions that are not followed completely may result in serious medical risk, up to and including death; or upon the discretion of your surgeon and anesthesiologist your surgery may need to be rescheduled.  Do not eat food after midnight the night before surgery.  No gum chewing, lozengers or hard candies.  You may however, drink CLEAR liquids up to 2 hours before you are scheduled to arrive for your surgery. Do not drink anything within 2 hours of your scheduled arrival time.  Clear liquids include: - water  - apple juice without pulp - gatorade  - black coffee or tea (Do NOT add milk or creamers to the coffee or tea) Do NOT drink anything that is not on this list.  In addition, your doctor has ordered for you to drink the provided  Ensure Pre-Surgery Clear Carbohydrate Drink  Drinking this carbohydrate drink up to two hours before surgery helps to reduce insulin resistance and improve patient outcomes. Please complete drinking 2 hours prior to scheduled arrival time.  TAKE THESE MEDICATIONS THE MORNING OF SURGERY WITH A SIP OF WATER: -METOPROLOL (TOPROL) -CELEXA (CITALOPRAM) -CARDURA (DOXAZOSIN) -GABAPENTIN (NEURONTIN) -SYNTHROID (LEVOTHYROXINE) -PRILOSEC (OMEPRAZOLE)-take one the night before and one on the morning of surgery - helps to prevent nausea after surgery.)  One week prior to surgery: Stop Anti-inflammatories (NSAIDS) such as Advil, Aleve, Ibuprofen, Motrin, Naproxen, Naprosyn and Aspirin based products such as Excedrin, Goodys Powder, BC Powder-OK TO TAKE TYLENOL IF NEEDED  Stop ANY OVER THE COUNTER supplements until after surgery-STOP YOUR BIOTIN, FISH OIL, CO Q-10 AND  GLUCOSAMINE-CHONDROITIN NOW-YOU MAY RESUME AFTER SURGERY (However, you may continue taking multivitamin up until the day before surgery.)  No Alcohol for 24 hours before or after surgery.  No Smoking including e-cigarettes for 24 hours prior to surgery.  No chewable tobacco products for at least 6 hours prior to surgery.  No nicotine patches on the day of surgery.  Do not use any "recreational" drugs for at least a week prior to your surgery.  Please be advised that the combination of cocaine and anesthesia may have negative outcomes, up to and including death. If you test positive for cocaine, your surgery will be cancelled.  On the morning of surgery brush your teeth with toothpaste and water, you may rinse your mouth with mouthwash if you wish. Do not swallow any toothpaste or mouthwash.  Do not wear jewelry, make-up, hairpins, clips or nail polish.  Do not wear lotions, powders, or perfumes.   Do not shave body from the neck down 48 hours prior to surgery just in case you cut yourself which could leave a site for infection.  Also, freshly shaved skin may become irritated if using the CHG soap.  Contact lenses, hearing aids and dentures may not be worn into surgery.  Do not bring valuables to the hospital. [facility] is not responsible for any missing/lost belongings or valuables.   Use CHG Soap as directed on instruction sheet.  Notify your doctor if there is any change in your medical condition (cold, fever, infection).  Wear comfortable clothing (specific to your surgery type) to the hospital.  Plan for stool softeners for home use; pain medications have a tendency to cause constipation.  You can also help prevent constipation by eating foods high in fiber such as fruits and vegetables and drinking plenty of fluids as your diet allows.  After surgery, you can help prevent lung complications by doing breathing exercises.  Take deep breaths and cough every 1-2 hours. Your  doctor may order a device called an Incentive Spirometer to help you take deep breaths. When coughing or sneezing, hold a pillow firmly against your incision with both hands. This is called "splinting." Doing this helps protect your incision. It also decreases belly discomfort.  If you are being admitted to the hospital overnight, leave your suitcase in the car. After surgery it may be brought to your room.  If you are being discharged the day of surgery, you will not be allowed to drive home. You will need a responsible adult (18 years or older) to drive you home and stay with you that night.   If you are taking public transportation, you will need to have a responsible adult (18 years or older) with you. Please confirm with your physician that it is acceptable to use public transportation.   Please call the [employer] [facility] at [phone] if you have any questions about these instructions.  Visitation Policy:  Patients undergoing a surgery or procedure may have one family member or support person with them as long as that person is not COVID-19 positive or experiencing its symptoms.  That person may remain in the waiting area during the procedure.  Inpatient Visitation:    Visiting hours are 7 a.m. to 8 p.m. Patients will be allowed one visitor. The visitor may change daily. The visitor must pass COVID-19 screenings, use hand sanitizer when entering and exiting the patient's room and wear a mask at all times, including in the patient's room. Patients must also wear a mask when staff or their visitor are in the room. Masking is required regardless of vaccination status. Systemwide, no visitors 17 or younger.

## 2020-02-09 ENCOUNTER — Encounter: Payer: Self-pay | Admitting: Student in an Organized Health Care Education/Training Program

## 2020-02-09 ENCOUNTER — Other Ambulatory Visit
Admission: RE | Admit: 2020-02-09 | Discharge: 2020-02-09 | Disposition: A | Discharge status: Home / Self Care | Payer: Medicare Other | Source: Ambulatory Visit | Attending: Orthopedic Surgery | Admitting: Orthopedic Surgery

## 2020-02-09 ENCOUNTER — Other Ambulatory Visit: Payer: Self-pay

## 2020-02-09 ENCOUNTER — Ambulatory Visit (HOSPITAL_BASED_OUTPATIENT_CLINIC_OR_DEPARTMENT_OTHER): Payer: Medicare Other | Admitting: Student in an Organized Health Care Education/Training Program

## 2020-02-09 VITALS — BP 133/82 | HR 76 | Temp 97.1°F | Resp 18 | Ht 66.25 in | Wt 202.6 lb

## 2020-02-09 DIAGNOSIS — I10 Essential (primary) hypertension: Secondary | ICD-10-CM | POA: Diagnosis not present

## 2020-02-09 DIAGNOSIS — Z20822 Contact with and (suspected) exposure to covid-19: Secondary | ICD-10-CM | POA: Diagnosis not present

## 2020-02-09 DIAGNOSIS — M25511 Pain in right shoulder: Secondary | ICD-10-CM | POA: Insufficient documentation

## 2020-02-09 DIAGNOSIS — C50512 Malignant neoplasm of lower-outer quadrant of left female breast: Secondary | ICD-10-CM | POA: Insufficient documentation

## 2020-02-09 DIAGNOSIS — M25512 Pain in left shoulder: Secondary | ICD-10-CM | POA: Insufficient documentation

## 2020-02-09 DIAGNOSIS — F410 Panic disorder [episodic paroxysmal anxiety] without agoraphobia: Secondary | ICD-10-CM

## 2020-02-09 DIAGNOSIS — M1612 Unilateral primary osteoarthritis, left hip: Secondary | ICD-10-CM | POA: Insufficient documentation

## 2020-02-09 DIAGNOSIS — M79604 Pain in right leg: Secondary | ICD-10-CM | POA: Insufficient documentation

## 2020-02-09 DIAGNOSIS — F4310 Post-traumatic stress disorder, unspecified: Secondary | ICD-10-CM

## 2020-02-09 DIAGNOSIS — M47816 Spondylosis without myelopathy or radiculopathy, lumbar region: Secondary | ICD-10-CM | POA: Diagnosis not present

## 2020-02-09 DIAGNOSIS — Z79890 Hormone replacement therapy (postmenopausal): Secondary | ICD-10-CM | POA: Insufficient documentation

## 2020-02-09 DIAGNOSIS — Z01812 Encounter for preprocedural laboratory examination: Secondary | ICD-10-CM | POA: Diagnosis present

## 2020-02-09 DIAGNOSIS — G8929 Other chronic pain: Secondary | ICD-10-CM | POA: Insufficient documentation

## 2020-02-09 DIAGNOSIS — F419 Anxiety disorder, unspecified: Secondary | ICD-10-CM | POA: Insufficient documentation

## 2020-02-09 DIAGNOSIS — M79605 Pain in left leg: Secondary | ICD-10-CM | POA: Insufficient documentation

## 2020-02-09 DIAGNOSIS — R519 Headache, unspecified: Secondary | ICD-10-CM | POA: Diagnosis not present

## 2020-02-09 DIAGNOSIS — M797 Fibromyalgia: Secondary | ICD-10-CM | POA: Insufficient documentation

## 2020-02-09 DIAGNOSIS — Z9010 Acquired absence of unspecified breast and nipple: Secondary | ICD-10-CM | POA: Insufficient documentation

## 2020-02-09 DIAGNOSIS — R439 Unspecified disturbances of smell and taste: Secondary | ICD-10-CM | POA: Diagnosis not present

## 2020-02-09 DIAGNOSIS — F411 Generalized anxiety disorder: Secondary | ICD-10-CM | POA: Diagnosis not present

## 2020-02-09 DIAGNOSIS — F09 Unspecified mental disorder due to known physiological condition: Secondary | ICD-10-CM

## 2020-02-09 DIAGNOSIS — Z79899 Other long term (current) drug therapy: Secondary | ICD-10-CM | POA: Diagnosis not present

## 2020-02-09 DIAGNOSIS — M25552 Pain in left hip: Secondary | ICD-10-CM | POA: Diagnosis not present

## 2020-02-09 DIAGNOSIS — M5136 Other intervertebral disc degeneration, lumbar region: Secondary | ICD-10-CM | POA: Diagnosis not present

## 2020-02-09 DIAGNOSIS — Z170 Estrogen receptor positive status [ER+]: Secondary | ICD-10-CM | POA: Insufficient documentation

## 2020-02-09 DIAGNOSIS — F4010 Social phobia, unspecified: Secondary | ICD-10-CM

## 2020-02-09 DIAGNOSIS — F3160 Bipolar disorder, current episode mixed, unspecified: Secondary | ICD-10-CM

## 2020-02-09 LAB — SARS CORONAVIRUS 2 (TAT 6-24 HRS): SARS Coronavirus 2: NEGATIVE

## 2020-02-09 MED ORDER — DICLOFENAC SODIUM 75 MG PO TBEC: 75.0000 mg | DELAYED_RELEASE_TABLET | Freq: Two times a day (BID) | ORAL | 0 refills | Status: DC

## 2020-02-09 NOTE — Progress Notes (Signed)
Safety precautions to be maintained throughout the outpatient stay will include: orient to surroundings, keep bed in low position, maintain call bell within reach at all times, provide assistance with transfer out of bed and ambulation.  

## 2020-02-09 NOTE — Patient Instructions (Signed)
Diclofenac has been escribed to your pharmacy.

## 2020-02-09 NOTE — Progress Notes (Signed)
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 (Neurological): Musculoskeletal pain pattern Palpation: Complains of area being tender to palpation              Upper Extremity (UE) Exam    Side: Right upper extremity  Side: Left upper extremity  Skin & Extremity Inspection: Skin color, temperature, and hair growth are WNL. No peripheral edema or cyanosis. No masses, redness, swelling, asymmetry, or associated skin lesions. No contractures.  Skin & Extremity Inspection: Skin color, temperature, and hair growth are WNL. No peripheral edema or cyanosis. No masses, redness, swelling, asymmetry, or associated skin lesions. No contractures.  Functional ROM: Pain restricted ROM for shoulder and elbow  Functional ROM: Pain restricted ROM for shoulder and elbow   Muscle Tone/Strength: Functionally intact. No obvious neuro-muscular anomalies detected.   Muscle Tone/Strength: Functionally intact. No obvious neuro-muscular anomalies detected.  Sensory (Neurological): Arthropathic arthralgia          Sensory (Neurological): Arthropathic arthralgia  Palpation: No palpable anomalies              Palpation: No palpable anomalies              Provocative Test(s):  Phalen's test: deferred Tinel's test: deferred Apley's scratch test (touch opposite shoulder):  Action 1 (Across chest): Decreased ROM Action 2 (Overhead): Decreased ROM Action 3 (LB reach): Decreased ROM   Provocative Test(s):  Phalen's test: deferred Tinel's test: deferred Apley's scratch test (touch opposite shoulder):  Action 1 (Across chest): Decreased ROM Action 2 (Overhead): Decreased ROM Action 3 (LB reach): Decreased ROM    Thoracic Spine Area Exam  Skin & Axial Inspection: No masses, redness, or swelling Alignment: Symmetrical Functional ROM: Unrestricted ROM Stability: No instability detected Muscle Tone/Strength: Functionally intact. No obvious neuro-muscular anomalies detected. Sensory (Neurological): Musculoskeletal pain pattern  Lumbar Exam  Skin & Axial Inspection: No masses, redness, or swelling Alignment: Symmetrical Functional ROM: Pain restricted ROM       Stability: No instability detected Muscle Tone/Strength: Functionally intact. No obvious neuro-muscular anomalies detected. Sensory (Neurological): Musculoskeletal pain pattern   Gait & Posture Assessment  Ambulation: Unassisted Gait: Relatively normal for age and body habitus Posture: WNL   Lower Extremity Exam    Side: Right lower extremity  Side: Left lower extremity  Stability: No instability observed          Stability: No instability observed          Skin & Extremity Inspection: Skin color, temperature, and hair growth are WNL. No peripheral edema or cyanosis. No masses, redness, swelling,  asymmetry, or associated skin lesions. No contractures.  Skin & Extremity Inspection: Skin color, temperature, and hair growth are WNL. No peripheral edema or cyanosis. No masses, redness, swelling, asymmetry, or associated skin lesions. No contractures.  Functional ROM: Unrestricted ROM                  Functional ROM: Pain restricted ROM for hip joint          Muscle Tone/Strength: Functionally intact. No obvious neuro-muscular anomalies detected.  Muscle Tone/Strength: Functionally intact. No obvious neuro-muscular anomalies detected.  Sensory (Neurological): Unimpaired        Sensory (Neurological): Arthropathic arthralgia        DTR: Patellar: deferred today Achilles: deferred today Plantar: deferred today  DTR: Patellar: deferred today Achilles: deferred today Plantar: deferred today  Palpation: No palpable anomalies  Palpation: No palpable anomalies   Assessment  Primary Diagnosis & Pertinent Problem List: The primary encounter diagnosis was Fibromyalgia. Diagnoses of Lumbar degenerative disc disease, Primary osteoarthritis of left hip, Anxiety, generalized, Social anxiety disorder, Panic disorder, PTSD (post-traumatic stress disorder), Bipolar I disorder, most recent episode mixed ([place]), and Cognitive disorder were also pertinent to this visit.  Visit Diagnosis (New problems to examiner): 1. Fibromyalgia   2. Lumbar degenerative disc disease   3. Primary osteoarthritis of left hip   4. Anxiety, generalized   5. Social anxiety disorder   6. Panic disorder   7. PTSD (post-traumatic stress disorder)   8. Bipolar I disorder, most recent episode mixed ([place])   9. Cognitive disorder    Plan of Care (Initial workup plan)   I had an extensive discussion with the patient regarding treatment options.  Patient is having upcoming left hip replacement surgery with orthopedics for left hip osteoarthritis.  Regards to her chronic pain related to fibromyalgia and osteoarthritis, I was very  clear with the patient and informed her that she would not  be a candidate for chronic opioid analgesics or our clinic given her psychiatric history and the fact that opioid analgesics are not beneficial long-term for fibromyalgia or osteoarthritis.  We discussed nonopioid analgesics.  She is currently on gabapentin.  She has tried Lyrica in the past with limited benefit.  I encouraged her to discuss Cymbalta with her psychiatrist as this could be effective for chronic pain as well as her mental health.  She is on psychotropic medications at this time.  Trial of diclofenac as below.  Instructed her to refrain from other NSAIDs while she is on this.  This can be continued by her primary care provider.   Pharmacotherapy (current): Medications ordered:  Meds ordered this encounter  Medications  . diclofenac (VOLTAREN) 75 MG EC tablet    Sig: Take 1 tablet (75 mg total) by mouth 2 (two) times daily.    Dispense:  180 tablet    Refill:  0   Medications administered during this visit: Kelli Logan had no medications administered during this visit.   Pharmacological management options:  Opioid Analgesics: N/A  Membrane stabilizer: Adequate regimen  Muscle relaxant: To be determined at a later time  NSAID: Diclofenac above  Other analgesic(s): To be determined at a later time    Provider-requested follow-up: Return if symptoms worsen or fail to improve.  Future Appointments  Date Time Provider [place]  02/16/2020  1:00 PM Ursula Alert, MD [facility] None  02/23/2020  3:00 PM Hussami, Christina R, LCSW [facility] None  02/24/2020  2:00 PM Alfonso Ellis [facility] CPR  10/25/2020  2:45 PM Cammie Sickle, MD [facility] None    Note by: Gillis Santa, MD Date: 02/09/2020; Time: 1:52 PM

## 2020-02-10 MED ORDER — LACTATED RINGERS IV SOLN: INTRAVENOUS | Status: DC

## 2020-02-10 MED ORDER — ORAL CARE MOUTH RINSE: 15.0000 mL | Freq: Once | OROMUCOSAL | Status: AC

## 2020-02-10 MED ORDER — CEFAZOLIN SODIUM-DEXTROSE 2-4 GM/100ML-% IV SOLN
2.0000 g | INTRAVENOUS | Status: AC
  Administered 2020-02-11: 2 g via INTRAVENOUS

## 2020-02-10 MED ORDER — CHLORHEXIDINE GLUCONATE 0.12 % MT SOLN: 15.0000 mL | Freq: Once | OROMUCOSAL | Status: AC

## 2020-02-11 ENCOUNTER — Other Ambulatory Visit: Payer: Self-pay

## 2020-02-11 ENCOUNTER — Ambulatory Visit: Payer: Medicare Other

## 2020-02-11 ENCOUNTER — Ambulatory Visit: Payer: Medicare Other | Admitting: Urgent Care

## 2020-02-11 ENCOUNTER — Ambulatory Visit
Admission: RE | Admit: 2020-02-11 | Discharge: 2020-02-11 | Disposition: A | Discharge status: Home / Self Care | Payer: Medicare Other | Attending: Orthopedic Surgery | Admitting: Orthopedic Surgery

## 2020-02-11 ENCOUNTER — Encounter
Admission: RE | Disposition: A | Discharge status: Home / Self Care | Payer: Self-pay | Source: Home / Self Care | Attending: Orthopedic Surgery

## 2020-02-11 ENCOUNTER — Encounter: Payer: Self-pay | Admitting: Orthopedic Surgery

## 2020-02-11 DIAGNOSIS — M1612 Unilateral primary osteoarthritis, left hip: Secondary | ICD-10-CM | POA: Diagnosis present

## 2020-02-11 DIAGNOSIS — Z83518 Family history of other specified eye disorder: Secondary | ICD-10-CM | POA: Diagnosis not present

## 2020-02-11 DIAGNOSIS — Z888 Allergy status to other drugs, medicaments and biological substances status: Secondary | ICD-10-CM | POA: Insufficient documentation

## 2020-02-11 DIAGNOSIS — Z79899 Other long term (current) drug therapy: Secondary | ICD-10-CM | POA: Diagnosis not present

## 2020-02-11 DIAGNOSIS — F32A Depression, unspecified: Secondary | ICD-10-CM | POA: Insufficient documentation

## 2020-02-11 DIAGNOSIS — Z419 Encounter for procedure for purposes other than remedying health state, unspecified: Secondary | ICD-10-CM

## 2020-02-11 DIAGNOSIS — G8929 Other chronic pain: Secondary | ICD-10-CM | POA: Insufficient documentation

## 2020-02-11 DIAGNOSIS — Z8349 Family history of other endocrine, nutritional and metabolic diseases: Secondary | ICD-10-CM | POA: Insufficient documentation

## 2020-02-11 DIAGNOSIS — Z79890 Hormone replacement therapy (postmenopausal): Secondary | ICD-10-CM | POA: Insufficient documentation

## 2020-02-11 DIAGNOSIS — Z8249 Family history of ischemic heart disease and other diseases of the circulatory system: Secondary | ICD-10-CM | POA: Insufficient documentation

## 2020-02-11 DIAGNOSIS — M7062 Trochanteric bursitis, left hip: Secondary | ICD-10-CM | POA: Insufficient documentation

## 2020-02-11 DIAGNOSIS — M25551 Pain in right hip: Secondary | ICD-10-CM | POA: Diagnosis not present

## 2020-02-11 DIAGNOSIS — Z853 Personal history of malignant neoplasm of breast: Secondary | ICD-10-CM | POA: Insufficient documentation

## 2020-02-11 DIAGNOSIS — F419 Anxiety disorder, unspecified: Secondary | ICD-10-CM | POA: Diagnosis not present

## 2020-02-11 DIAGNOSIS — M25552 Pain in left hip: Secondary | ICD-10-CM | POA: Insufficient documentation

## 2020-02-11 DIAGNOSIS — G8918 Other acute postprocedural pain: Secondary | ICD-10-CM

## 2020-02-11 HISTORY — PX: TOTAL HIP ARTHROPLASTY: SHX124

## 2020-02-11 LAB — CBC
HCT: 34.7 % — ABNORMAL LOW (ref 36.0–46.0)
Hemoglobin: 11.7 g/dL — ABNORMAL LOW (ref 12.0–15.0)
MCH: 28.7 pg (ref 26.0–34.0)
MCHC: 33.7 g/dL (ref 30.0–36.0)
MCV: 85 fL (ref 80.0–100.0)
Platelets: 272 10*3/uL (ref 150–400)
RBC: 4.08 MIL/uL (ref 3.87–5.11)
RDW: 12 % (ref 11.5–15.5)
WBC: 12.1 10*3/uL — ABNORMAL HIGH (ref 4.0–10.5)
nRBC: 0 % (ref 0.0–0.2)

## 2020-02-11 LAB — CREATININE, SERUM
Creatinine, Ser: 0.91 mg/dL (ref 0.44–1.00)
GFR, Estimated: >60 mL/min (ref >60)

## 2020-02-11 SURGERY — ARTHROPLASTY, HIP, TOTAL, ANTERIOR APPROACH
Anesthesia: Spinal | Site: Hip | Laterality: Left

## 2020-02-11 MED ORDER — ENOXAPARIN SODIUM 40 MG/0.4ML ~~LOC~~ SOLN: 40.0000 mg | SUBCUTANEOUS | Status: DC

## 2020-02-11 MED ORDER — TRAMADOL HCL 50 MG PO TABS: 100.0000 mg | ORAL_TABLET | Freq: Four times a day (QID) | ORAL | Status: DC

## 2020-02-11 MED ORDER — BUPIVACAINE-EPINEPHRINE 0.25% -1:200000 IJ SOLN
INTRAMUSCULAR | Status: DC | PRN
  Administered 2020-02-11: 30 mL

## 2020-02-11 MED ORDER — PROPOFOL 500 MG/50ML IV EMUL
INTRAVENOUS | Status: AC
  Filled 2020-02-11: qty 50

## 2020-02-11 MED ORDER — OXYCODONE HCL 5 MG/5ML PO SOLN: 5.0000 mg | Freq: Once | ORAL | Status: AC | PRN

## 2020-02-11 MED ORDER — METHOCARBAMOL 500 MG PO TABS
ORAL_TABLET | ORAL | Status: AC
  Administered 2020-02-11: 500 mg via ORAL
  Filled 2020-02-11: qty 1

## 2020-02-11 MED ORDER — PROPOFOL 10 MG/ML IV BOLUS
INTRAVENOUS | Status: AC
  Filled 2020-02-11: qty 20

## 2020-02-11 MED ORDER — ONDANSETRON HCL 4 MG/2ML IJ SOLN
INTRAMUSCULAR | Status: DC | PRN
  Administered 2020-02-11: 4 mg via INTRAVENOUS

## 2020-02-11 MED ORDER — NEOMYCIN-POLYMYXIN B GU 40-200000 IR SOLN
Status: DC | PRN
  Administered 2020-02-11: 2 mL

## 2020-02-11 MED ORDER — BUPIVACAINE-EPINEPHRINE (PF) 0.25% -1:200000 IJ SOLN
INTRAMUSCULAR | Status: AC
  Filled 2020-02-11: qty 30

## 2020-02-11 MED ORDER — BUPIVACAINE LIPOSOME 1.3 % IJ SUSP
INTRAMUSCULAR | Status: AC
  Filled 2020-02-11: qty 20

## 2020-02-11 MED ORDER — FENTANYL CITRATE (PF) 100 MCG/2ML IJ SOLN: 25.0000 ug | INTRAMUSCULAR | Status: DC | PRN

## 2020-02-11 MED ORDER — PHENOL 1.4 % MT LIQD
1.0000 | OROMUCOSAL | Status: DC | PRN
  Filled 2020-02-11: qty 177

## 2020-02-11 MED ORDER — EPHEDRINE SULFATE 50 MG/ML IJ SOLN
INTRAMUSCULAR | Status: DC | PRN
  Administered 2020-02-11 (×2): 10 mg via INTRAVENOUS
  Administered 2020-02-11: 5 mg via INTRAVENOUS
  Administered 2020-02-11: 10 mg via INTRAVENOUS
  Administered 2020-02-11: 5 mg via INTRAVENOUS

## 2020-02-11 MED ORDER — FENTANYL CITRATE (PF) 100 MCG/2ML IJ SOLN
INTRAMUSCULAR | Status: AC
  Filled 2020-02-11: qty 2

## 2020-02-11 MED ORDER — ACETAMINOPHEN 10 MG/ML IV SOLN
INTRAVENOUS | Status: DC | PRN
  Administered 2020-02-11: 1000 mg via INTRAVENOUS

## 2020-02-11 MED ORDER — MIDAZOLAM HCL 5 MG/5ML IJ SOLN
INTRAMUSCULAR | Status: DC | PRN
  Administered 2020-02-11: 2 mg via INTRAVENOUS

## 2020-02-11 MED ORDER — METOCLOPRAMIDE HCL 5 MG/ML IJ SOLN: 5.0000 mg | Freq: Three times a day (TID) | INTRAMUSCULAR | Status: DC | PRN

## 2020-02-11 MED ORDER — TRAMADOL HCL 50 MG PO TABS: 50.0000 mg | ORAL_TABLET | Freq: Four times a day (QID) | ORAL | Status: DC

## 2020-02-11 MED ORDER — LACTATED RINGERS IV BOLUS: 250.0000 mL | Freq: Once | INTRAVENOUS | Status: DC

## 2020-02-11 MED ORDER — SODIUM CHLORIDE FLUSH 0.9 % IV SOLN
INTRAVENOUS | Status: AC
  Filled 2020-02-11: qty 40

## 2020-02-11 MED ORDER — ENOXAPARIN SODIUM 40 MG/0.4ML ~~LOC~~ SOLN: 40.0000 mg | SUBCUTANEOUS | 0 refills | Status: DC

## 2020-02-11 MED ORDER — METOCLOPRAMIDE HCL 10 MG PO TABS: 5.0000 mg | ORAL_TABLET | Freq: Three times a day (TID) | ORAL | Status: DC | PRN

## 2020-02-11 MED ORDER — OXYCODONE HCL 5 MG PO TABS
10.0000 mg | ORAL_TABLET | ORAL | Status: DC | PRN
  Administered 2020-02-11: 5 mg via ORAL

## 2020-02-11 MED ORDER — HYDROMORPHONE HCL 1 MG/ML IJ SOLN: 0.5000 mg | INTRAMUSCULAR | Status: DC | PRN

## 2020-02-11 MED ORDER — MIDAZOLAM HCL 2 MG/2ML IJ SOLN
INTRAMUSCULAR | Status: AC
  Filled 2020-02-11: qty 2

## 2020-02-11 MED ORDER — OXYCODONE HCL 5 MG PO TABS
ORAL_TABLET | ORAL | Status: AC
  Administered 2020-02-11: 5 mg via ORAL
  Filled 2020-02-11: qty 1

## 2020-02-11 MED ORDER — TRAMADOL HCL 50 MG PO TABS
ORAL_TABLET | ORAL | Status: AC
  Filled 2020-02-11: qty 2

## 2020-02-11 MED ORDER — ONDANSETRON 4 MG PO TBDP: 4.0000 mg | ORAL_TABLET | Freq: Three times a day (TID) | ORAL | 0 refills | Status: DC | PRN

## 2020-02-11 MED ORDER — TRANEXAMIC ACID-NACL 1000-0.7 MG/100ML-% IV SOLN
INTRAVENOUS | Status: AC
  Filled 2020-02-11: qty 100

## 2020-02-11 MED ORDER — OXYCODONE HCL 5 MG PO TABS: 5.0000 mg | ORAL_TABLET | ORAL | Status: DC | PRN

## 2020-02-11 MED ORDER — DOCUSATE SODIUM 100 MG PO CAPS
100.0000 mg | ORAL_CAPSULE | Freq: Two times a day (BID) | ORAL | Status: DC
  Filled 2020-02-11: qty 1

## 2020-02-11 MED ORDER — ACETAMINOPHEN 325 MG PO TABS: 325.0000 mg | ORAL_TABLET | Freq: Four times a day (QID) | ORAL | Status: DC | PRN

## 2020-02-11 MED ORDER — SODIUM CHLORIDE 0.9 % IV SOLN
INTRAVENOUS | Status: DC | PRN
  Administered 2020-02-11: 50 ug/min via INTRAVENOUS

## 2020-02-11 MED ORDER — CEFAZOLIN SODIUM-DEXTROSE 2-4 GM/100ML-% IV SOLN
INTRAVENOUS | Status: AC
  Filled 2020-02-11: qty 100

## 2020-02-11 MED ORDER — OXYCODONE HCL 5 MG PO TABS: 5.0000 mg | ORAL_TABLET | ORAL | 0 refills | Status: DC | PRN

## 2020-02-11 MED ORDER — OXYCODONE HCL 5 MG PO TABS
ORAL_TABLET | ORAL | Status: AC
  Filled 2020-02-11: qty 1

## 2020-02-11 MED ORDER — METHOCARBAMOL 500 MG PO TABS: 500.0000 mg | ORAL_TABLET | Freq: Four times a day (QID) | ORAL | Status: DC | PRN

## 2020-02-11 MED ORDER — NEOMYCIN-POLYMYXIN B GU 40-200000 IR SOLN
Status: AC
  Filled 2020-02-11: qty 4

## 2020-02-11 MED ORDER — TRANEXAMIC ACID-NACL 1000-0.7 MG/100ML-% IV SOLN
1000.0000 mg | Freq: Once | INTRAVENOUS | Status: AC
  Administered 2020-02-11: 1000 mg via INTRAVENOUS

## 2020-02-11 MED ORDER — ONDANSETRON HCL 4 MG/2ML IJ SOLN: 4.0000 mg | Freq: Once | INTRAMUSCULAR | Status: DC | PRN

## 2020-02-11 MED ORDER — CEFAZOLIN SODIUM-DEXTROSE 2-4 GM/100ML-% IV SOLN
2.0000 g | Freq: Four times a day (QID) | INTRAVENOUS | Status: DC
Start: 2020-02-11 — End: 2020-02-11

## 2020-02-11 MED ORDER — ACETAMINOPHEN 10 MG/ML IV SOLN
INTRAVENOUS | Status: AC
  Filled 2020-02-11: qty 100

## 2020-02-11 MED ORDER — FENTANYL CITRATE (PF) 100 MCG/2ML IJ SOLN
INTRAMUSCULAR | Status: DC | PRN
  Administered 2020-02-11: 25 ug via INTRAVENOUS

## 2020-02-11 MED ORDER — CEFAZOLIN SODIUM-DEXTROSE 2-4 GM/100ML-% IV SOLN
INTRAVENOUS | Status: AC
  Administered 2020-02-11: 2 g via INTRAVENOUS
  Filled 2020-02-11: qty 100

## 2020-02-11 MED ORDER — SODIUM CHLORIDE 0.9 % IV SOLN: INTRAVENOUS | Status: DC

## 2020-02-11 MED ORDER — ACETAMINOPHEN 10 MG/ML IV SOLN: 1000.0000 mg | Freq: Once | INTRAVENOUS | Status: DC | PRN

## 2020-02-11 MED ORDER — SODIUM CHLORIDE 0.9 % IV SOLN
INTRAVENOUS | Status: DC | PRN
  Administered 2020-02-11: 60 mL

## 2020-02-11 MED ORDER — METHOCARBAMOL 1000 MG/10ML IJ SOLN
500.0000 mg | Freq: Four times a day (QID) | INTRAVENOUS | Status: DC | PRN
  Filled 2020-02-11: qty 5

## 2020-02-11 MED ORDER — OXYCODONE HCL 5 MG PO TABS: 5.0000 mg | ORAL_TABLET | Freq: Once | ORAL | Status: AC | PRN

## 2020-02-11 MED ORDER — BUPIVACAINE HCL (PF) 0.5 % IJ SOLN
INTRAMUSCULAR | Status: DC | PRN
  Administered 2020-02-11: 2.4 mL

## 2020-02-11 MED ORDER — TRAMADOL HCL 50 MG PO TABS
100.0000 mg | ORAL_TABLET | Freq: Once | ORAL | Status: AC
  Administered 2020-02-11: 100 mg via ORAL

## 2020-02-11 MED ORDER — CHLORHEXIDINE GLUCONATE 0.12 % MT SOLN
OROMUCOSAL | Status: AC
  Administered 2020-02-11: 15 mL via OROMUCOSAL
  Filled 2020-02-11: qty 15

## 2020-02-11 MED ORDER — PHENYLEPHRINE HCL (PRESSORS) 10 MG/ML IV SOLN
INTRAVENOUS | Status: DC | PRN
  Administered 2020-02-11: 100 ug via INTRAVENOUS

## 2020-02-11 MED ORDER — ONDANSETRON HCL 4 MG/2ML IJ SOLN: 4.0000 mg | Freq: Four times a day (QID) | INTRAMUSCULAR | Status: DC | PRN

## 2020-02-11 MED ORDER — LACTATED RINGERS IV BOLUS
500.0000 mL | Freq: Once | INTRAVENOUS | Status: AC
  Administered 2020-02-11: 500 mL via INTRAVENOUS

## 2020-02-11 MED ORDER — MENTHOL 3 MG MT LOZG
1.0000 | LOZENGE | OROMUCOSAL | Status: DC | PRN
  Filled 2020-02-11: qty 9

## 2020-02-11 MED ORDER — PROPOFOL 500 MG/50ML IV EMUL
INTRAVENOUS | Status: DC | PRN
  Administered 2020-02-11: 125 ug/kg/min via INTRAVENOUS

## 2020-02-11 MED ORDER — TRAMADOL HCL 50 MG PO TABS: 50.0000 mg | ORAL_TABLET | Freq: Four times a day (QID) | ORAL | 0 refills | Status: DC | PRN

## 2020-02-11 MED ORDER — ONDANSETRON HCL 4 MG PO TABS: 4.0000 mg | ORAL_TABLET | Freq: Four times a day (QID) | ORAL | Status: DC | PRN

## 2020-02-11 SURGICAL SUPPLY — 62 items
BLADE SAGITTAL AGGR TOOTH XLG (BLADE) ×2 IMPLANT
BNDG COHESIVE 6X5 TAN STRL LF (GAUZE/BANDAGES/DRESSINGS) ×6 IMPLANT
CANISTER SUCT 1200ML W/VALVE (MISCELLANEOUS) ×2 IMPLANT
CANISTER WOUND CARE 500ML ATS (WOUND CARE) ×2 IMPLANT
CHLORAPREP W/TINT 26 (MISCELLANEOUS) ×2 IMPLANT
COVER BACK TABLE REUSABLE LG (DRAPES) ×2 IMPLANT
COVER WAND RF STERILE (DRAPES) ×2 IMPLANT
DRAPE 3/4 80X56 (DRAPES) ×6 IMPLANT
DRAPE C-ARM XRAY 36X54 (DRAPES) ×2 IMPLANT
DRAPE INCISE IOBAN 66X60 STRL (DRAPES) IMPLANT
DRAPE POUCH INSTRU U-SHP 10X18 (DRAPES) ×2 IMPLANT
DRESSING SURGICEL FIBRLLR 1X2 (HEMOSTASIS) ×2 IMPLANT
DRSG MEPILEX SACRM 8.7X9.8 (GAUZE/BANDAGES/DRESSINGS) ×2 IMPLANT
DRSG OPSITE POSTOP 4X8 (GAUZE/BANDAGES/DRESSINGS) ×4 IMPLANT
DRSG SURGICEL FIBRILLAR 1X2 (HEMOSTASIS) ×4
ELECT BLADE 6.5 EXT (BLADE) ×2 IMPLANT
ELECT REM PT RETURN 9FT ADLT (ELECTROSURGICAL) ×2
ELECTRODE REM PT RTRN 9FT ADLT (ELECTROSURGICAL) ×1 IMPLANT
GLOVE BIOGEL PI IND STRL 9 (GLOVE) ×1 IMPLANT
GLOVE BIOGEL PI INDICATOR 9 (GLOVE) ×1
GLOVE SURG SYN 9.0  PF PI (GLOVE) ×2
GLOVE SURG SYN 9.0 PF PI (GLOVE) ×2 IMPLANT
GOWN SRG 2XL LVL 4 RGLN SLV (GOWNS) ×1 IMPLANT
GOWN STRL NON-REIN 2XL LVL4 (GOWNS) ×1
GOWN STRL REUS W/ TWL LRG LVL3 (GOWN DISPOSABLE) ×1 IMPLANT
GOWN STRL REUS W/TWL LRG LVL3 (GOWN DISPOSABLE) ×1
HEAD FEMORAL 28MM SZ S (Head) ×2 IMPLANT
HEMOVAC 400CC 10FR (MISCELLANEOUS) IMPLANT
HOLDER FOLEY CATH W/STRAP (MISCELLANEOUS) ×2 IMPLANT
HOOD PEEL AWAY FLYTE STAYCOOL (MISCELLANEOUS) ×2 IMPLANT
IRRIGATION SURGIPHOR STRL (IV SOLUTION) IMPLANT
KIT PREVENA INCISION MGT 13 (CANNISTER) ×2 IMPLANT
LINER DBL MOB SZ 0 52MM (Liner) ×2 IMPLANT
MANIFOLD NEPTUNE II (INSTRUMENTS) ×2 IMPLANT
MAT ABSORB  FLUID 56X50 GRAY (MISCELLANEOUS) ×1
MAT ABSORB FLUID 56X50 GRAY (MISCELLANEOUS) ×1 IMPLANT
NDL SAFETY ECLIPSE 18X1.5 (NEEDLE) ×1 IMPLANT
NEEDLE HYPO 18GX1.5 SHARP (NEEDLE) ×1
NEEDLE SPNL 20GX3.5 QUINCKE YW (NEEDLE) ×4 IMPLANT
NS IRRIG 1000ML POUR BTL (IV SOLUTION) ×2 IMPLANT
PACK HIP COMPR (MISCELLANEOUS) ×2 IMPLANT
SCALPEL PROTECTED #10 DISP (BLADE) ×4 IMPLANT
SHELL ACETABULAR SZ 52 DM (Shell) ×2 IMPLANT
SOL PREP PVP 2OZ (MISCELLANEOUS) ×2
SOLUTION PREP PVP 2OZ (MISCELLANEOUS) ×1 IMPLANT
SPONGE DRAIN TRACH 4X4 STRL 2S (GAUZE/BANDAGES/DRESSINGS) ×2 IMPLANT
STAPLER SKIN PROX 35W (STAPLE) ×2 IMPLANT
STEM FEMORAL 4 STD COLLARED (Stem) ×2 IMPLANT
STRAP SAFETY 5IN WIDE (MISCELLANEOUS) ×2 IMPLANT
SUT DVC 2 QUILL PDO  T11 36X36 (SUTURE) ×1
SUT DVC 2 QUILL PDO T11 36X36 (SUTURE) ×1 IMPLANT
SUT SILK 0 (SUTURE) ×1
SUT SILK 0 30XBRD TIE 6 (SUTURE) ×1 IMPLANT
SUT V-LOC 90 ABS DVC 3-0 CL (SUTURE) ×2 IMPLANT
SUT VIC AB 1 CT1 36 (SUTURE) ×2 IMPLANT
SYR 20ML LL LF (SYRINGE) ×2 IMPLANT
SYR 30ML LL (SYRINGE) ×2 IMPLANT
SYR 50ML LL SCALE MARK (SYRINGE) ×4 IMPLANT
SYR BULB IRRIG 60ML STRL (SYRINGE) ×2 IMPLANT
TAPE MICROFOAM 4IN (TAPE) ×2 IMPLANT
TOWEL OR 17X26 4PK STRL BLUE (TOWEL DISPOSABLE) ×2 IMPLANT
TRAY FOLEY MTR SLVR 16FR STAT (SET/KITS/TRAYS/PACK) ×2 IMPLANT

## 2020-02-11 NOTE — Discharge Instructions (Signed)
AMBULATORY SURGERY  DISCHARGE INSTRUCTIONS   1) The drugs that you were given will stay in your system until tomorrow so for the next 24 hours you should not:  A) Drive an automobile B) Make any legal decisions C) Drink any alcoholic beverage   2) You may resume regular meals tomorrow.  Today it is better to start with liquids and gradually work up to solid foods.  You may eat anything you prefer, but it is better to start with liquids, then soup and crackers, and gradually work up to solid foods.   3) Please notify your doctor immediately if you have any unusual bleeding, trouble breathing, redness and pain at the surgery site, drainage, fever, or pain not relieved by medication.    4) Additional Instructions:        Please contact your physician with any problems or Same Day Surgery at [phone], Monday through Friday 6 am to 4 pm, or [place] at [facility] number at [phone].AMBULATORY SURGERY  DISCHARGE INSTRUCTIONS   5) The drugs that you were given will stay in your system until tomorrow so for the next 24 hours you should not:  D) Drive an automobile E) Make any legal decisions F) Drink any alcoholic beverage   6) You may resume regular meals tomorrow.  Today it is better to start with liquids and gradually work up to solid foods.  You may eat anything you prefer, but it is better to start with liquids, then soup and crackers, and gradually work up to solid foods.   7) Please notify your doctor immediately if you have any unusual bleeding, trouble breathing, redness and pain at the surgery site, drainage, fever, or pain not relieved by medication.    8) Additional Instructions:        Please contact your physician with any problems or Same Day Surgery at [phone], Monday through Friday 6 am to 4 pm, or [place] at [facility] number at [phone].Total Hip Replacement, Anterior, Care After This sheet gives you information about  how to care for yourself after your procedure. Your health care provider may also give you more specific instructions. If you have problems or questions, contact your health care provider. What can I expect after the procedure? After the procedure, it is common to have:  Redness, pain, and swelling at your incision area.  Stiffness.  Discomfort.  A small amount of blood or clear fluid coming from your incision. Follow these instructions at home: Medicines  Take over-the-counter and prescription medicines only as told by your health care provider.  If you were prescribed a blood thinner (anticoagulant) to help prevent blood clots, take it as told by your health care provider.  Ask your health care provider if the medicine prescribed to you: ? Requires you to avoid driving or using machinery. ? Can cause constipation. You may need to take these actions to prevent or treat constipation:  Drink enough fluid to keep your urine pale yellow.  Take over-the-counter or prescription medicines.  Eat foods that are high in fiber, such as beans, whole grains, and fresh fruits and vegetables.  Limit foods that are high in fat and processed sugars, such as fried or sweet foods. Incision care  Follow instructions from your health care provider about how to take care of your incision. Make sure you: ? Wash your hands with soap and water for at least 20 seconds before and after you change your bandage (dressing). If soap and water are  not available, use hand sanitizer. ? Change your dressing as told by your health care provider. ? Leave stitches (sutures), staples, skin glue, or adhesive strips in place. These skin closures may need to stay in place for 2 weeks or longer. If adhesive strip edges start to loosen and curl up, you may trim the loose edges. Do not remove adhesive strips completely unless your health care provider tells you to do that.  Do not take baths, swim, or use a hot tub until  your health care provider approves.  Check your incision area every day for signs of infection. Check for: ? More redness, swelling, or pain. ? More fluid or blood. ? Warmth. ? Pus or a bad smell.   Managing pain, stiffness, and swelling  If directed, put ice on the affected area. To do this: ? Put ice in a plastic bag. ? Place a towel between your skin and the bag. ? Leave the ice on for 20 minutes, 2-3 times a day. ? Remove the ice if your skin turns bright red. This is very important. If you cannot feel pain, heat, or cold, you have a greater risk of damage to the area.  Move your toes often to reduce stiffness and swelling.  Raise (elevate) your leg above the level of your heart while you are lying down.   Activity  Ask your health care provider what activities are safe for you.  Avoid sitting for a long time without moving. Get up to take short walks every 1-2 hours. This is important to improve blood flow and breathing. Ask for help if you feel weak or unsteady.  Do exercises as told by your health care provider.  Do not use your legs to support (bear) your body weight until your health care provider says that you can. Follow instructions about how much weight you may safely support on your affected leg (weight-bearing restrictions).  Use a walker, crutches, or a cane as told by your health care provider.  Return to your normal activities as told by your health care provider. Movement restrictions  To help prevent hip dislocation, follow instructions about movement restrictions as told. For example: ? Do not cross your legs at the knees. To remind yourself about this, you may keep a pillow between your legs while lying in bed. ? Do not bend at the hip and waist or bend farther than 90 degrees of hip flexion. To avoid bending this far:  Do not bring your knees higher than your hips.  Do not pick up something from the floor while sitting in a chair.  Avoid sitting in low  chairs.  Use a raised toilet seat.  When standing up from a seated position, keep the injured leg out in front of you. ? Avoid twisting at your waist and reaching across your body to the side of the affected leg. ? Avoid rotating your toes inward.  When getting into a car: 1. Raise the seat as high as possible, move the seat as far back as it will go, and recline the upper part of the seat slightly. 2. Sit down into the seat with your injured leg extended out of the car. 3. Scoot back into the seat as you move the lower half of your body into the car. Try to avoid bumping your foot or leg as you bring it into the car.   Safety  To help prevent falls, keep floors clear of objects you may trip over, and place  items that you may need within easy reach.  Wear an apron or tool belt with pockets for carrying objects. This leaves your hands free to help with your balance. General instructions  Ask your health care provider when it is safe to drive.  Wear compression stockings as told by your health care provider. These stockings help to prevent blood clots and reduce swelling in your legs.  Keep doing breathing exercises as told. These help prevent lung infection.  Do not use any products that contain nicotine or tobacco, such as cigarettes, e-cigarettes, and chewing tobacco. These can delay healing after surgery. If you need help quitting, ask your health care provider.  Tell your health care provider if you plan to have dental work. Also: ? Tell your dentist about your joint replacement. ? Ask your health care provider if there are any special instructions you need to follow before having dental care and routine cleanings.  Keep all follow-up visits. This is important. Contact a health care provider if:  You have a fever or chills.  You have a cough.  Your medicine is not controlling your pain.  You have more redness, swelling, or pain around your incision.  You have more fluid  or blood coming from your incision.  Your incision feels warm to the touch.  You have pus or a bad smell coming from your incision. Get help right away if:  You have severe pain.  You have shortness of breath or trouble breathing.  You have chest pain.  You have redness, swelling, pain, or warmth in your calf or leg.  Your incision breaks open after suturesor staples are removed. These symptoms may represent a serious problem that is an emergency. Do not wait to see if the symptoms will go away. Get medical help right away. Call your local emergency services (911 in the [place] Do not drive yourself to the hospital. Summary  Do not use your legs to support your body weight until your health care provider says that you can. Use a walker, crutches, or a cane as told.  To help prevent hip dislocation, follow instructions about movement restrictions as told.  Keep all follow-up visits. This is important. This information is not intended to replace advice given to you by your health care provider. Make sure you discuss any questions you have with your health care provider. Document Revised: 05/21/2019 Document Reviewed: 05/21/2019 Elsevier Patient Education  [place].

## 2020-02-11 NOTE — Evaluation (Signed)
Physical Therapy Evaluation Patient Details Name: Kelli Logan MRN: [id number] DOB: 01-02-1961 Today's Date: 02/11/2020   History of Present Illness  Pt is a 60 yo female s/p L THA, anterior approach, WBAT. PMH of sleep apnea, HTN, anxiety, depression, bipolar disorder, fibromyalgia.    Clinical Impression  Pt alert, eager for PT, wanting to go home ASAP. Pt reported that she was independent at baseline, and a friend plans on staying with her at discharge (first floor apartment) to assist as needed.  The patient was up in the recliner at start of session. HEP, wound vac, and packet reviewed, pt verbalized understanding. Sit <> stand with CGA and RW, cued for hand placement to maximize safety. She was able to ambulate ~168ft with RW and CGA. Initially step to gait pattern but progressed to step through, antalgic gait noted on LLE as well as decreased velocity. Pt and PT also reviewed car transfers as well as precautions and WBAT. Pt returned to recliner with all needs in reach.   Overall the patient demonstrated deficits (see "PT Problem List") that impede the patient's functional abilities, safety, and mobility and would benefit from skilled PT intervention. Recommendation is HHPT with supervision for mobility as well as intermittently to assist with IADLs/ADLs as needed.     Follow Up Recommendations Home health PT;Supervision for mobility/OOB;Supervision - Intermittent    Equipment Recommendations  Rolling walker with 5" wheels;3in1 (PT)    Recommendations for Other Services       Precautions / Restrictions Precautions Precautions: Fall;Anterior Hip Precaution Booklet Issued: Yes (comment) Restrictions Weight Bearing Restrictions: Yes LLE Weight Bearing: Weight bearing as tolerated      Mobility  Bed Mobility               General bed mobility comments: pt up in recliner    Transfers Overall transfer level: Needs assistance Equipment used: Rolling walker (2  wheeled) Transfers: Sit to/from Stand Sit to Stand: Supervision            Ambulation/Gait Ambulation/Gait assistance: Min guard Gait Distance (Feet): 150 Feet Assistive device: Rolling walker (2 wheeled)       General Gait Details: step to initially, able to progress to step through, antalgic gait noted  Stairs            Wheelchair Mobility    Modified Rankin (Stroke Patients Only)       Balance Overall balance assessment: Needs assistance Sitting-balance support: Feet supported Sitting balance-Leahy Scale: Normal       Standing balance-Leahy Scale: Fair Standing balance comment: reliant on UE support for ambulation                             Pertinent Vitals/Pain Pain Assessment: 0-10 Pain Score: 6  Pain Location: L hip Pain Descriptors / Indicators: Aching;Grimacing Pain Intervention(s): Limited activity within patient's tolerance;Monitored during session;Repositioned    Home Living Family/patient expects to be discharged to:: Private residence Living Arrangements: Alone Available Help at Discharge: Friend(s);Available PRN/intermittently (friend will be staying with patient at discharge) Type of Home: Apartment Home Access: Level entry     Home Layout: One level Home Equipment: Grab bars - tub/shower      Prior Function Level of Independence: Independent               Hand Dominance        Extremity/Trunk Assessment   Upper Extremity Assessment Upper Extremity Assessment: Overall WFL for tasks  assessed    Lower Extremity Assessment Lower Extremity Assessment: RLE deficits/detail;LLE deficits/detail RLE Deficits / Details: WFLs LLE Deficits / Details: s/p L THA    Cervical / Trunk Assessment Cervical / Trunk Assessment: Normal  Communication   Communication: No difficulties  Cognition Arousal/Alertness: Awake/alert Behavior During Therapy: WFL for tasks assessed/performed Overall Cognitive Status: Within  Functional Limits for tasks assessed                                        General Comments      Exercises Other Exercises Other Exercises: Pt and PT reviewed HEP packet, pt verbalized understanding Other Exercises: car transfers reviewed as well   Assessment/Plan    PT Assessment Patient needs continued PT services  PT Problem List Decreased strength;Decreased mobility;Decreased range of motion;Decreased activity tolerance;Decreased balance;Decreased knowledge of use of DME;Pain       PT Treatment Interventions DME instruction;Therapeutic exercise;Gait training;Balance training;Stair training;Neuromuscular re-education;Functional mobility training;Patient/family education;Therapeutic activities    PT Goals (Current goals can be found in the Care Plan section)  Acute Rehab PT Goals Patient Stated Goal: to go home PT Goal Formulation: With patient Time For Goal Achievement: 02/25/20 Potential to Achieve Goals: Good    Frequency BID   Barriers to discharge        Co-evaluation               AM-PAC PT "6 Clicks" Mobility  Outcome Measure Help needed turning from your back to your side while in a flat bed without using bedrails?: None Help needed moving from lying on your back to sitting on the side of a flat bed without using bedrails?: None Help needed moving to and from a bed to a chair (including a wheelchair)?: None Help needed standing up from a chair using your arms (e.g., wheelchair or bedside chair)?: A Little Help needed to walk in hospital room?: A Little Help needed climbing 3-5 steps with a railing? : A Little 6 Click Score: 21    End of Session Equipment Utilized During Treatment: Gait belt Activity Tolerance: Patient tolerated treatment well   Nurse Communication: Mobility status PT Visit Diagnosis: Other abnormalities of gait and mobility (R26.89);Muscle weakness (generalized) (M62.81);Difficulty in walking, not elsewhere  classified (R26.2);Pain Pain - Right/Left: Left Pain - part of body: Hip    Time: 6195-0932 PT Time Calculation (min) (ACUTE ONLY): 31 min   Charges:   PT Evaluation $PT Eval Low Complexity: 1 Low PT Treatments $Gait Training: 8-22 mins $Therapeutic Exercise: 8-22 mins        Lieutenant Diego PT, DPT 4:21 PM,02/11/20

## 2020-02-11 NOTE — Transfer of Care (Signed)
Immediate Anesthesia Transfer of Care Note  Patient: Kelli Logan  Procedure(s) Performed: TOTAL HIP ARTHROPLASTY ANTERIOR APPROACH (Left Hip)  Patient Location: PACU  Anesthesia Type:Spinal  Level of Consciousness: awake, alert  and oriented  Airway & Oxygen Therapy: Patient Spontanous Breathing  Post-op Assessment: Report given to RN and Post -op Vital signs reviewed and stable  Post vital signs: stable  Last Vitals:  Vitals Value Taken Time  BP 117/74 02/11/20 0909  Temp    Pulse 61 02/11/20 0911  Resp 10 02/11/20 0911  SpO2 100 % 02/11/20 0911  Vitals shown include unvalidated device data.  Last Pain:  Vitals:   02/11/20 0607  TempSrc: Temporal  PainSc: 0-No pain         Complications: No complications documented.

## 2020-02-11 NOTE — Anesthesia Preprocedure Evaluation (Signed)
Anesthesia Evaluation  Patient identified by MRN, date of birth, ID band Patient awake    Reviewed: Allergy & Precautions, NPO status , Patient's Chart, lab work & pertinent test results  History of Anesthesia Complications Negative for: history of anesthetic complications  Airway Mallampati: III  TM Distance: >3 FB Neck ROM: Full    Dental no notable dental hx. (+) Teeth Intact   Pulmonary sleep apnea , neg COPD, Patient abstained from smoking.Not current smoker, former smoker,    Pulmonary exam normal breath sounds clear to auscultation       Cardiovascular Exercise Tolerance: Good METShypertension, Pt. on medications (-) CAD and (-) Past MI Normal cardiovascular exam(-) dysrhythmias  Rhythm:Regular Rate:Normal     Neuro/Psych  Headaches, PSYCHIATRIC DISORDERS Anxiety Depression Bipolar Disorder    GI/Hepatic Neg liver ROS, hiatal hernia, GERD  Medicated and Controlled,  Endo/Other  neg diabetesHypothyroidism   Renal/GU negative Renal ROS  negative genitourinary   Musculoskeletal  (+) Arthritis , Osteoarthritis,  Fibromyalgia -  Abdominal   Peds negative pediatric ROS (+)  Hematology negative hematology ROS (+)   Anesthesia Other Findings Past Medical History: No date: Anxiety No date: Arthritis No date: Cancer [facility])     Comment:  breast left No date: Depression No date: Fibromyalgia No date: GERD (gastroesophageal reflux disease) No date: Headache No date: History of kidney stones     Comment:  h/o No date: Hypertension No date: Hypothyroidism No date: Sleep apnea     Comment:  DOES NOT USE CPAP No date: Thyroid disease  Reproductive/Obstetrics negative OB ROS                             Anesthesia Physical  Anesthesia Plan  ASA: II  Anesthesia Plan: Spinal   Post-op Pain Management:    Induction: Intravenous  PONV Risk Score and Plan: 3 and Ondansetron,  Dexamethasone, Treatment may vary due to age or medical condition, Midazolam, Propofol infusion and TIVA  Airway Management Planned: Natural Airway  Additional Equipment:   Intra-op Plan:   Post-operative Plan: Extubation in OR  Informed Consent: I have reviewed the patients History and Physical, chart, labs and discussed the procedure including the risks, benefits and alternatives for the proposed anesthesia with the patient or authorized representative who has indicated his/her understanding and acceptance.     Dental advisory given  Plan Discussed with: CRNA and Surgeon  Anesthesia Plan Comments: (Discussed R/B/A of neuraxial anesthesia technique with patient: - rare risks of spinal/epidural hematoma, nerve damage, infection - Risk of PDPH - Risk of itching - Risk of nausea and vomiting - Risk of conversion to general anesthesia and its associated risks, including sore throat, damage to lips/teeth/oropharynx, and rare risks such as cardiac and respiratory events. - Risk of surgical bleeding requiring blood products Patient voiced understanding.)        Anesthesia Quick Evaluation

## 2020-02-11 NOTE — Progress Notes (Signed)
Notified by RN patient needs 3 in 1 and RW ordered prior to DC. Ordered through [employer], who will deliver to bedside.   Confirmed with [facility] that Surgeon's Office already arranged HHPT for patient.  No additional TOC needs identified prior to DC.  Oleh Genin, [place]
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
## 2020-02-11 NOTE — Anesthesia Procedure Notes (Signed)
Spinal  Start time: 02/11/2020 7:24 AM End time: 02/11/2020 7:24 AM Staffing Performed: resident/CRNA  Resident/CRNA: Natasha Mead, CRNA Preanesthetic Checklist Completed: patient identified, IV checked, site marked, risks and benefits discussed, surgical consent, monitors and equipment checked, pre-op evaluation and timeout performed Spinal Block Patient position: sitting Prep: ChloraPrep Patient monitoring: cardiac monitor, heart rate, continuous pulse ox and blood pressure Approach: midline Location: L4-5 Injection technique: single-shot Needle Needle type: Pencan  Needle gauge: 24 G Needle length: 5 cm Assessment Sensory level: T10

## 2020-02-11 NOTE — Anesthesia Postprocedure Evaluation (Signed)
Anesthesia Post Note  Patient: Kelli Logan  Procedure(s) Performed: TOTAL HIP ARTHROPLASTY ANTERIOR APPROACH (Left Hip)  Patient location during evaluation: PACU Anesthesia Type: Spinal Level of consciousness: oriented and awake and alert Pain management: pain level controlled Vital Signs Assessment: post-procedure vital signs reviewed and stable Respiratory status: spontaneous breathing, respiratory function stable and patient connected to nasal cannula oxygen Cardiovascular status: blood pressure returned to baseline and stable Postop Assessment: no headache, no backache and no apparent nausea or vomiting Anesthetic complications: no   No complications documented.   Last Vitals:  Vitals:   02/11/20 0945 02/11/20 1000  BP: 97/72 107/66  Pulse: (!) 58 (!) 59  Resp: 15 10  Temp: (!) 36.1 C   SpO2: 100% 97%    Last Pain:  Vitals:   02/11/20 1000  TempSrc:   PainSc: 0-No pain                 Arita Miss

## 2020-02-11 NOTE — Op Note (Signed)
02/11/2020  9:04 AM  PATIENT:  Kelli Logan  60 y.o. female  PRE-OPERATIVE DIAGNOSIS:  Primary osteoarthritis of left hip M16.12 Chronic pain of both hips M25.551, M25.552, G89.29 Trochanteric bursitis of left hip M70.62  POST-OPERATIVE DIAGNOSIS:  Primary osteoarthritis of left hip M16.12  PROCEDURE:  Procedure(s): TOTAL HIP ARTHROPLASTY ANTERIOR APPROACH (Left)  SURGEON: Laurene Footman, MD  ASSISTANTS: none  ANESTHESIA:   spinal  EBL:  Total I/O In: 1200 [I.V.:1000; IV Piggyback:200] Out: 500 [Urine:250; Blood:250]  BLOOD ADMINISTERED:none  DRAINS: Incisional wound VAC   LOCAL MEDICATIONS USED:  MARCAINE    and OTHER Exparel  SPECIMEN:  Source of Specimen:  Left femoral head  DISPOSITION OF SPECIMEN:  PATHOLOGY  COUNTS:  YES  TOURNIQUET:  * No tourniquets in log *  IMPLANTS: Medacta AMIS 4 standard stem with Mpact DM 52 mm cup and liner with ceramic S 28 mm head  DICTATION: .Dragon Dictation   The patient was brought to the operating room and after spinal anesthesia was obtained patient was placed on the operative table with the ipsilateral foot into the Medacta attachment, contralateral leg on a well-padded table. C-arm was brought in and preop template x-ray taken. After prepping and draping in usual sterile fashion appropriate patient identification and timeout procedures were completed. Anterior approach to the hip was obtained and centered over the greater trochanter and TFL muscle. The subcutaneous tissue was incised hemostasis being achieved by electrocautery. TFL fascia was incised and the muscle retracted laterally deep retractor placed.  The leg was internally rotated and trochanteric bursa debrided.  The lateral femoral circumflex vessels were identified and ligated. The anterior capsule was exposed and a capsulotomy performed. The neck was identified and a femoral neck cut carried out with a saw. The head was removed without difficulty and showed minimal  degenerative changes to the femoral head and significant wear to the medial wall of the acetabulum. Reaming was carried out to 52 mm and a 52 mm cup trial gave appropriate tightness to the acetabular component a 52 DM cup was impacted into position. The leg was then externally rotated and ischiofemoral and pubofemoral releases carried out. The femur was sequentially broached to a size 4, size 4 with S head trials were placed and the final components chosen. The 4 standard stem was inserted along with a ceramic S 28 mm head and 52 mm liner. The hip was reduced and was stable the wound was thoroughly irrigated with fibrillar placed along the posterior capsule and medial neck. The deep fascia ws closed using a heavy Quill after infiltration of 30 cc of quarter percent Sensorcaine with epinephrine mixed with Exparel throughout the case, .3-0 V-loc to close the skin with skin staples.  Incisional wound VAC applied and patient was sent to recovery in stable condition.   PLAN OF CARE: Discharge to home after PACU

## 2020-02-12 ENCOUNTER — Encounter: Payer: Self-pay | Admitting: Orthopedic Surgery

## 2020-02-12 LAB — SURGICAL PATHOLOGY

## 2020-02-15 ENCOUNTER — Telehealth: Payer: Self-pay | Admitting: Psychiatry

## 2020-02-16 ENCOUNTER — Other Ambulatory Visit: Payer: Self-pay

## 2020-02-16 ENCOUNTER — Encounter: Payer: Self-pay | Admitting: Psychiatry

## 2020-02-16 ENCOUNTER — Telehealth (INDEPENDENT_AMBULATORY_CARE_PROVIDER_SITE_OTHER): Payer: Medicare Other | Admitting: Psychiatry

## 2020-02-16 DIAGNOSIS — F4010 Social phobia, unspecified: Secondary | ICD-10-CM | POA: Diagnosis not present

## 2020-02-16 DIAGNOSIS — F410 Panic disorder [episodic paroxysmal anxiety] without agoraphobia: Secondary | ICD-10-CM | POA: Diagnosis not present

## 2020-02-16 DIAGNOSIS — F424 Excoriation (skin-picking) disorder: Secondary | ICD-10-CM

## 2020-02-16 DIAGNOSIS — Z8659 Personal history of other mental and behavioral disorders: Secondary | ICD-10-CM

## 2020-02-16 DIAGNOSIS — F4310 Post-traumatic stress disorder, unspecified: Secondary | ICD-10-CM | POA: Diagnosis not present

## 2020-02-16 DIAGNOSIS — F09 Unspecified mental disorder due to known physiological condition: Secondary | ICD-10-CM

## 2020-02-16 NOTE — Progress Notes (Signed)
Virtual Visit via Telephone Note  I connected with Kelli Logan on 02/16/20 at  1:00 PM EST by telephone and verified that I am speaking with the correct person using two identifiers.  Location Provider Location : [facility] Patient Location : Home  Participants: Patient , Provider    I discussed the limitations, risks, security and privacy concerns of performing an evaluation and management service by telephone and the availability of in person appointments. I also discussed with the patient that there may be a patient responsible charge related to this service. The patient expressed understanding and agreed to proceed.   I discussed the assessment and treatment plan with the patient. The patient was provided an opportunity to ask questions and all were answered. The patient agreed with the plan and demonstrated an understanding of the instructions.   The patient was advised to call back or seek an in-person evaluation if the symptoms worsen or if the condition fails to improve as anticipated.   [place] MD OP Progress Note  02/16/2020 1:47 PM Chelcy Bolda  MRN:  [id number]  Chief Complaint:  Chief Complaint    Follow-up     HPI: Kelli Logan is a 60 year old Caucasian female, divorced, lives in [place], has a history of PTSD, skin picking disorder, social anxiety disorder, history of borderline personality disorder, cognitive disorder, breast cancer, recent total hip replacement surgery was evaluated by telemedicine today.  Patient reports she had her total hip replacement surgery last week.  She reports she is currently on tramadol and oxycodone for pain management.  She reports she also has physical therapy as well as a an aide coming in to assist her few times a week.  She reports in spite of taking the pain medications around-the-clock her pain is currently at 7 out of 10, 10 being the worst.  She plans to get in touch with her surgeon to discuss it.  Patient reports she is  anxious about her recent surgery and her pain however overall she has been coping okay.  She denies any skin picking problems at this time.  Patient denies any mood swings.  She does report sleep is restless due to pain.  She reports she is only taking the Celexa which she is tolerating well.  She reports she stopped taking the Abilify which was started in place of the Seroquel by her neurologist.  She reports the Abilify was causing her tremors as well as she was concerned about her weight gain.  She reports she is not interested in making any changes with her medications today and wants to just stay on the Celexa.  Patient reports she had her neuropsychological testing done and has upcoming appointment for follow-up.  She denies any suicidality, homicidality or perceptual disturbances.  Patient denies any other concerns today.  Visit Diagnosis:    ICD-10-CM   1. PTSD (post-traumatic stress disorder)  F43.10   2. Skin-picking disorder  F42.4   3. Panic disorder  F41.0   4. Social anxiety disorder  F40.10   5. Cognitive disorder  F09   6. H/O borderline personality disorder  Z86.59     Past Psychiatric History: I have reviewed past psychiatric history from my progress note on 03/10/2018.  Past trials of Paxil, Prozac, Zoloft, Lamictal, Depakote, Klonopin, Seroquel  Past Medical History:  Past Medical History:  Diagnosis Date  . Anxiety   . Arthritis   . Cancer [facility])    breast left  . Depression   . Fibromyalgia   .  GERD (gastroesophageal reflux disease)   . Headache   . History of kidney stones    h/o  . Hypertension   . Hypothyroidism   . Sleep apnea    DOES NOT USE CPAP  . Thyroid disease     Past Surgical History:  Procedure Laterality Date  . APPENDECTOMY    . BREAST BIOPSY Left few yrs ago   stereotactic bx in [place], benign  . BREAST BIOPSY Left 12/26/2017   Affirm Bx [facility]- X-Clip  . BREAST BIOPSY Left 01/14/2018   Affirm Bx- Coil clip- path pending  . BREAST  RECONSTRUCTION WITH PLACEMENT OF TISSUE EXPANDER AND ALLODERM Left 04/08/2018   Procedure: LEFT BREAST IMMEDIATE  RECONSTRUCTION WITH EXPANDER AND FLEX HD;  Surgeon: Wallace Going, DO;  Location: [facility];  Service: Plastics;  Laterality: Left;  . COLONOSCOPY WITH PROPOFOL N/A 06/26/2018   Procedure: COLONOSCOPY WITH PROPOFOL;  Surgeon: Virgel Manifold, MD;  Location: [facility];  Service: Endoscopy;  Laterality: N/A;  . ESOPHAGOGASTRODUODENOSCOPY (EGD) WITH PROPOFOL N/A 06/26/2018   Procedure: ESOPHAGOGASTRODUODENOSCOPY (EGD) WITH PROPOFOL;  Surgeon: Virgel Manifold, MD;  Location: [facility];  Service: Endoscopy;  Laterality: N/A;  . EYE SURGERY Bilateral    lasik  . FOOT BONE EXCISION    . IMAGE GUIDED SINUS SURGERY  10/220  . LIPOSUCTION WITH LIPOFILLING Left 04/30/2019   Procedure: Fat grafting to left breast;  Surgeon: Wallace Going, DO;  Location: [place];  Service: Plastics;  Laterality: Left;  90 min, please  . MASTECTOMY Left 04/08/2019  . MASTECTOMY W/ SENTINEL NODE BIOPSY Left 04/08/2018   Procedure: MASTECTOMY WITH SENTINEL LYMPH NODE BIOPSY LEFT;  Surgeon: Fredirick Maudlin, MD;  Location: [facility];  Service: General;  Laterality: Left;  Marland Kitchen MASTOPEXY Right 08/04/2018   Procedure: MASTOPEXY;  Surgeon: Wallace Going, DO;  Location: [facility];  Service: Plastics;  Laterality: Right;  TOTAL CASE TIME SHOULD BE 3 HOURS, PLEASE  . REMOVAL OF TISSUE EXPANDER AND PLACEMENT OF IMPLANT Left 08/04/2018   Procedure: REMOVAL OF TISSUE EXPANDER AND PLACEMENT OF IMPLANT;  Surgeon: Wallace Going, DO;  Location: [facility];  Service: Plastics;  Laterality: Left;  . SCAR REVISION Left 04/30/2019   Procedure: release of scar contracture to left breast;  Surgeon: Wallace Going, DO;  Location: [place];  Service: Plastics;  Laterality: Left;  . TOTAL HIP ARTHROPLASTY Left 02/11/2020   Procedure: TOTAL HIP ARTHROPLASTY ANTERIOR  APPROACH;  Surgeon: Hessie Knows, MD;  Location: [facility];  Service: Orthopedics;  Laterality: Left;  . UMBILICAL HERNIA REPAIR  1983 and 1985    Family Psychiatric History: I have reviewed family psychiatric history from my progress note on 03/10/2018  Family History:  Family History  Problem Relation Age of Onset  . Hypertension Mother   . Lung cancer Maternal Grandmother   . Heart disease Father   . Alcohol abuse Father   . Breast cancer Neg Hx   . Colon cancer Neg Hx     Social History: I have reviewed social history from my progress note on 03/10/2018 Social History   Socioeconomic History  . Marital status: Divorced    Spouse name: Not on file  . Number of children: 1  . Years of education: Not on file  . Highest education level: High school graduate  Occupational History  . Not on file  Tobacco Use  . Smoking status: Former Smoker    Packs/day: 1.00    Years:  2.00    Pack years: 2.00    Types: Cigarettes    Quit date: 01/06/2017    Years since quitting: 3.1  . Smokeless tobacco: Never Used  Vaping Use  . Vaping Use: Former  . Quit date: 11/27/2016  Substance and Sexual Activity  . Alcohol use: Yes    Comment: rare beer   . Drug use: Not Currently  . Sexual activity: Not on file  Other Topics Concern  . 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 Quit smoking; ocassional alcohol.    Social Determinants of Health   Financial Resource Strain: High Risk  . Difficulty of Paying Living Expenses: Very hard  Food Insecurity: Food Insecurity Present  . Worried About Charity fundraiser in the Last Year: Often true  . Ran Out of Food in the Last Year: Often true  Transportation Needs: Not on file  Physical Activity: Not on file  Stress: Stress Concern Present  . Feeling of Stress : Very much  Social Connections: Not on file    Allergies:   Allergies  Allergen Reactions  . Tape Other (See Comments)    Paper tape after breast surgery/Burned skin   Tegaderm and other tape OK    Metabolic Disorder Labs: No results found for: HGBA1C, MPG No results found for: PROLACTIN No results found for: CHOL, TRIG, HDL, CHOLHDL, VLDL, LDLCALC No results found for: TSH  Therapeutic Level Labs: No results found for: LITHIUM No results found for: VALPROATE No components found for:  CBMZ  Current Medications: Current Outpatient Medications  Medication Sig Dispense Refill  . atorvastatin (LIPITOR) 40 MG tablet Take 40 mg by mouth at bedtime.     . citalopram (CELEXA) 40 MG tablet TAKE 1 TABLET BY MOUTH EVERY DAY IN THE MORNING (Patient taking differently: Take 40 mg by mouth every morning.) 90 tablet 0  . Coenzyme Q10 (COQ10) 100 MG CAPS Take 100 mg by mouth every evening.    Marland Kitchen doxazosin (CARDURA) 1 MG tablet Take 1 tablet (1 mg total) by mouth 2 (two) times daily. Needs office visit for further refills. Thank you! 60 tablet 0  . enoxaparin (LOVENOX) 40 MG/0.4ML injection Inject 0.4 mLs (40 mg total) into the skin daily for 12 days. 4.8 mL 0  . gabapentin (NEURONTIN) 300 MG capsule Take 300 mg by mouth every morning.    . gabapentin (NEURONTIN) 600 MG tablet Take 1 tablet (600 mg total) by mouth as directed. TAKE HALF TABLET IN THE AM AND HALF TABLET AT LUNCH AND 1 TABLET( 600 MG ) AT BEDTIME (Patient taking differently: Take 600 mg by mouth at bedtime.) 180 tablet 0  . GLUCOSAMINE-CHONDROITIN PO Take 1-2 tablets by mouth daily. 1500-1200    . hydrochlorothiazide (HYDRODIURIL) 25 MG tablet Take 25 mg by mouth daily.     . hydrOXYzine (VISTARIL) 50 MG capsule Take 1 capsule (50 mg total) by mouth daily as needed. For sleep, anxiety (Patient taking differently: Take 50 mg by mouth at bedtime. For sleep, anxiety) 90 capsule 0  . levothyroxine (SYNTHROID) 75 MCG tablet Take 75 mcg by mouth every other day. Alternating with 50 mcg every other  day-AM    . levothyroxine (SYNTHROID, LEVOTHROID) 50 MCG tablet Take 50 mcg by mouth every other day. Alternating with 75 mcg every other day-AM    . losartan (COZAAR) 100 MG tablet Take 100 mg by mouth  daily. for high blood pressure    . metoprolol (LOPRESSOR) 50 MG tablet Take 1 tablet (50 mg total) by mouth 2 (two) times daily. 60 tablet 0  . Multiple Vitamin (MULTIVITAMIN WITH MINERALS) TABS tablet Take 1 tablet by mouth daily.    . Omega-3 Fatty Acids (FISH OIL PO) Take 1 capsule by mouth daily.    Marland Kitchen omeprazole (PRILOSEC) 20 MG capsule Take 20 mg by mouth every morning.    . ondansetron (ZOFRAN ODT) 4 MG disintegrating tablet Take 1 tablet (4 mg total) by mouth every 8 (eight) hours as needed for nausea or vomiting. 20 tablet 0  . oxyCODONE (ROXICODONE) 5 MG immediate release tablet Take 1 tablet (5 mg total) by mouth every 4 (four) hours as needed for severe pain. 30 tablet 0  . traMADol (ULTRAM) 50 MG tablet Take 1 tablet (50 mg total) by mouth every 6 (six) hours as needed. 20 tablet 0   No current facility-administered medications for this visit.     Musculoskeletal: Strength & Muscle Tone: UTA Gait & Station: UTA Patient leans: N/A  Psychiatric Specialty Exam: Review of Systems  Musculoskeletal: Positive for back pain.       Shoulder pain, hip joint pain - S/P total hip replacement last week.- rates her pain as 7/10 , 10 being the worst  Psychiatric/Behavioral: Positive for sleep disturbance. The patient is nervous/anxious.   All other systems reviewed and are negative.   Last menstrual period 02/05/2016.There is no height or weight on file to calculate BMI.  General Appearance: UTA  Eye Contact:  UTA  Speech:  Clear and Coherent  Volume:  Normal  Mood:  Anxious  Affect:  UTA  Thought Process:  Goal Directed and Descriptions of Associations: Intact  Orientation:  Full (Time, Place, and Person)  Thought Content: Logical   Suicidal Thoughts:  No  Homicidal Thoughts:  No   Memory:  Immediate;   Fair Recent;   Fair Remote;   Fair  Judgement:  Fair  Insight:  Fair  Psychomotor Activity:  UTA  Concentration:  Concentration: Fair and Attention Span: Fair  Recall:  [employer] of Knowledge: Fair  Language: Fair  Akathisia:  No  Handed:  Right  AIMS (if indicated): UTA  Assets:  Communication Skills Desire for Improvement Housing  ADL's:  Intact  Cognition: WNL  Sleep:  Restless   Screenings: Flowsheet Row Admission (Discharged) from 02/11/2020 in [place] CATEGORY No Risk       Assessment and Plan: Sefora Tietje is a 60 year old Caucasian female, divorced, lives in [place], has a history of PTSD, skin picking disorder, panic attacks, social anxiety disorder, breast cancer status post mastectomy, chronic pain was evaluated by telemedicine today.  Patient is biologically predisposed given her family history, history of trauma, multiple health issues.  Patient with psychosocial stressors of recent surgery, death of her mother, financial problems , currently reports she is stable with regards to her mood.  Plan as noted below.  Plan PTSD-stable Celexa as prescribed Discontinue Abilify for noncompliance. Continue CBT as needed  Social anxiety disorder-improving Hydroxyzine 50 mg p.o. twice daily as needed Gabapentin as prescribed Continue Celexa 40 mg p.o. daily  Skin picking disorder-stable Continue CBT as needed patient was advised to start CBT last visit-has upcoming appointment on 02/23/2020.  Cognitive disorder-patient was referred for neuropsychological testing that she has upcoming appointment scheduled. Patient also has been advised to get the following labs-TSH, vitamin  B12-noncompliant. Encouraged compliance  Follow-up in clinic in 4 weeks or sooner if needed.  I have spent atleast 20 minutes non face to face with patient today. More than 50 % of the time was spent for preparing  to see the patient ( e.g., review of test, records ), ordering medications and test ,psychoeducation and supportive psychotherapy and care coordination,as well as documenting clinical information in electronic health record. This note was generated in part or whole with voice recognition software. Voice recognition is usually quite accurate but there are transcription errors that can and very often do occur. I apologize for any typographical errors that were not detected and corrected.       Ursula Alert, MD 02/16/2020, 1:47 PM

## 2020-02-23 ENCOUNTER — Ambulatory Visit (INDEPENDENT_AMBULATORY_CARE_PROVIDER_SITE_OTHER): Payer: Medicare Other | Admitting: Licensed Clinical Social Worker

## 2020-02-23 ENCOUNTER — Other Ambulatory Visit: Payer: Self-pay

## 2020-02-23 ENCOUNTER — Encounter: Payer: Medicare Other | Admitting: Plastic Surgery

## 2020-02-23 DIAGNOSIS — F424 Excoriation (skin-picking) disorder: Secondary | ICD-10-CM

## 2020-02-23 DIAGNOSIS — F4310 Post-traumatic stress disorder, unspecified: Secondary | ICD-10-CM

## 2020-02-23 NOTE — Progress Notes (Addendum)
Virtual Visit via Audio Note  I connected with Kelli Logan on 02/23/20 at  3:00 PM EST by an audio enabled telemedicine application and verified that I am speaking with the correct person using two identifiers.  Location: Patient: home Provider: [facility]   I discussed the limitations of evaluation and management by telemedicine and the availability of in person appointments. The patient expressed understanding and agreed to proceed.  I discussed the assessment and treatment plan with the patient. The patient was provided an opportunity to ask questions and all were answered. The patient agreed with the plan and demonstrated an understanding of the instructions.   The patient was advised to call back or seek an in-person evaluation if the symptoms worsen or if the condition fails to improve as anticipated.  I provided 60 minutes of non-face-to-face time during this encounter.   Alin Chavira R Holliday Sheaffer, LCSW    THERAPIST PROGRESS NOTE  Session Time: 3-4p  Participation Level: Active  Behavioral Response: NAAlertAnxious and Depressed  Type of Therapy: Individual Therapy  Treatment Goals addressed:   Interventions: Supportive  Summary: Kelli Logan is a 60 y.o. female who presents with continuing stress associated with  PTSD and skin-picking. Pt reports that she has stopped the skin picking and cannot wait to come into the office and show Dr. Shea Evans her legs. Pt reports that she is recovering from surgery, so she is having some escalating anxiety due to being her own caregiver. Pt had a friend that was coming to stay and help but decided not to. Allowed pt to explore her feelings about this. Pt has an older couple and some caregivers from [facility] that are coming to her home to help care for her.  Discussed pts thoughts and feelings about changes going on in her body right now and overall psychological impact.   Discussed pts new kitten--triggers anxiety because it howls all  night long when pt puts it in the small room out of the way (during PT and OT appts). Pt currently happy with the PT and OT progress.  Continued to recommend self care, life balance, positive social interaction.   Suicidal/Homicidal: No  Therapist Response: Kelli Logan is using coping skills to manage stress and anxiety and is able to reflect back what is working well for her. Pt is struggling right now after surgery recovery. Progress fluctuating/intermittent. Treatment to continue.  Plan: Return again in 4 weeks.  Diagnosis: Axis I: Post Traumatic Stress Disorder    Axis II: No diagnosis    Kelli Bo Dequarius Jeffries, LCSW 02/23/2020

## 2020-02-24 ENCOUNTER — Encounter: Payer: Medicare Other | Admitting: Psychology

## 2020-03-08 ENCOUNTER — Encounter: Payer: Self-pay | Admitting: Psychology

## 2020-03-08 ENCOUNTER — Other Ambulatory Visit: Payer: Self-pay

## 2020-03-08 ENCOUNTER — Encounter: Payer: Medicare Other | Attending: Psychology | Admitting: Psychology

## 2020-03-08 ENCOUNTER — Other Ambulatory Visit: Payer: Self-pay | Admitting: Psychiatry

## 2020-03-08 DIAGNOSIS — F423 Hoarding disorder: Secondary | ICD-10-CM | POA: Diagnosis present

## 2020-03-08 DIAGNOSIS — F3130 Bipolar disorder, current episode depressed, mild or moderate severity, unspecified: Secondary | ICD-10-CM | POA: Insufficient documentation

## 2020-03-08 DIAGNOSIS — F424 Excoriation (skin-picking) disorder: Secondary | ICD-10-CM | POA: Diagnosis present

## 2020-03-08 DIAGNOSIS — F4310 Post-traumatic stress disorder, unspecified: Secondary | ICD-10-CM

## 2020-03-08 DIAGNOSIS — F4312 Post-traumatic stress disorder, chronic: Secondary | ICD-10-CM

## 2020-03-08 DIAGNOSIS — F410 Panic disorder [episodic paroxysmal anxiety] without agoraphobia: Secondary | ICD-10-CM

## 2020-03-08 DIAGNOSIS — F4010 Social phobia, unspecified: Secondary | ICD-10-CM

## 2020-03-08 DIAGNOSIS — F908 Attention-deficit hyperactivity disorder, other type: Secondary | ICD-10-CM | POA: Insufficient documentation

## 2020-03-08 DIAGNOSIS — F688 Other specified disorders of adult personality and behavior: Secondary | ICD-10-CM | POA: Insufficient documentation

## 2020-03-08 NOTE — Progress Notes (Signed)
   Neuropsychology Feedback Appointment  Kelli Logan returned for a feedback appointment today to review the results of her recent neuropsychological evaluation with this provider. 60 minutes face-to-face time was spent reviewing her test results, my impressions and my recommendations as detailed in her report. Education was provided about ADHD, current psychiatric diagnoses, and nonpharmacologic interventions for pain management. The patient was given the opportunity to ask questions, and I did my best to answer these to their satisfaction.

## 2020-03-22 ENCOUNTER — Encounter: Payer: Self-pay | Admitting: Psychiatry

## 2020-03-22 ENCOUNTER — Telehealth (INDEPENDENT_AMBULATORY_CARE_PROVIDER_SITE_OTHER): Payer: Medicare Other | Admitting: Psychiatry

## 2020-03-22 ENCOUNTER — Other Ambulatory Visit: Payer: Self-pay

## 2020-03-22 DIAGNOSIS — F09 Unspecified mental disorder due to known physiological condition: Secondary | ICD-10-CM

## 2020-03-22 DIAGNOSIS — F4010 Social phobia, unspecified: Secondary | ICD-10-CM

## 2020-03-22 DIAGNOSIS — F424 Excoriation (skin-picking) disorder: Secondary | ICD-10-CM | POA: Diagnosis not present

## 2020-03-22 DIAGNOSIS — F410 Panic disorder [episodic paroxysmal anxiety] without agoraphobia: Secondary | ICD-10-CM | POA: Diagnosis not present

## 2020-03-22 DIAGNOSIS — Z8659 Personal history of other mental and behavioral disorders: Secondary | ICD-10-CM

## 2020-03-22 DIAGNOSIS — F4310 Post-traumatic stress disorder, unspecified: Secondary | ICD-10-CM | POA: Diagnosis not present

## 2020-03-22 MED ORDER — TRAZODONE HCL 50 MG PO TABS: 50.0000 mg | ORAL_TABLET | Freq: Every evening | ORAL | 1 refills | Status: DC | PRN

## 2020-03-22 NOTE — Progress Notes (Signed)
Virtual Visit via Video Note  I connected with Kelli Logan on 03/22/20 at  1:00 PM EDT by a video enabled telemedicine application and verified that I am speaking with the correct person using two identifiers.  Location Provider Location : [facility] Patient Location : Home  Participants: Patient , Provider    I discussed the limitations of evaluation and management by telemedicine and the availability of in person appointments. The patient expressed understanding and agreed to proceed.   I discussed the assessment and treatment plan with the patient. The patient was provided an opportunity to ask questions and all were answered. The patient agreed with the plan and demonstrated an understanding of the instructions.   The patient was advised to call back or seek an in-person evaluation if the symptoms worsen or if the condition fails to improve as anticipated.   [place] MD OP Progress Note  03/22/2020 5:13 PM Kelli Logan  MRN:  [id number]  Chief Complaint:  Chief Complaint    Follow-up; Anxiety     HPI: Kelli Logan is a 60 year old Caucasian female, divorced, lives in [place], has a history of PTSD, skin picking disorder, social anxiety disorder, history of borderline personality disorder, cognitive disorder, history of breast cancer, recent total hip replacement surgery was evaluated by telemedicine today.  Patient today reports she is currently struggling with anxiety symptoms.  She reports her anxiety may have worsened after her total hip replacement surgery. She however continues to follow-up with her providers and is trying to work on it.  She reports she has noticed herself as biting her nails as well as chewing the inside of her mouth on a regular basis.  She reports she does not want to make changes with her medications and she believes her anxiety could also be because of her lifestyle.  She has been drinking caffeinated drinks like sodas as well as taking a lot of  sugary food.  Patient reports when she was in a pain management program recently she was getting therapy there.  She hence did not follow-up with our therapist here at the practice.  She however has upcoming appointment scheduled.  She is willing to work with the therapist.  Patient denies any suicidality, homicidality or perceptual disturbances.  She is compliant on medications.  She reports she does struggle with sleep on a regular basis.  She has difficulty falling asleep as well as staying asleep.  She is interested in medication management for sleep.  She is not interested in medications for her anxiety symptoms and she wants to make lifestyle changes first.  Patient denies any other concerns today.  Visit Diagnosis:    ICD-10-CM   1. PTSD (post-traumatic stress disorder)  F43.10 traZODone (DESYREL) 50 MG tablet  2. Skin-picking disorder  F42.4   3. Panic disorder  F41.0   4. Social anxiety disorder  F40.10   5. Cognitive disorder  F09   6. H/O borderline personality disorder  Z86.59     Past Psychiatric History: I have reviewed past psychiatric history from my progress note on 03/10/2018.  Past trials of Paxil, Prozac, Zoloft, Lamictal, Depakote, Klonopin, Seroquel  Past Medical History:  Past Medical History:  Diagnosis Date  . Anxiety   . Arthritis   . Cancer [facility])    breast left  . Depression   . Fibromyalgia   . GERD (gastroesophageal reflux disease)   . Headache   . History of kidney stones    h/o  . Hypertension   .  Hypothyroidism   . Sleep apnea    DOES NOT USE CPAP  . Thyroid disease     Past Surgical History:  Procedure Laterality Date  . APPENDECTOMY    . BREAST BIOPSY Left few yrs ago   stereotactic bx in [place], benign  . BREAST BIOPSY Left 12/26/2017   Affirm Bx [facility]- X-Clip  . BREAST BIOPSY Left 01/14/2018   Affirm Bx- Coil clip- path pending  . BREAST RECONSTRUCTION WITH PLACEMENT OF TISSUE EXPANDER AND ALLODERM Left 04/08/2018   Procedure: LEFT  BREAST IMMEDIATE  RECONSTRUCTION WITH EXPANDER AND FLEX HD;  Surgeon: Wallace Going, DO;  Location: [facility];  Service: Plastics;  Laterality: Left;  . COLONOSCOPY WITH PROPOFOL N/A 06/26/2018   Procedure: COLONOSCOPY WITH PROPOFOL;  Surgeon: Virgel Manifold, MD;  Location: [facility];  Service: Endoscopy;  Laterality: N/A;  . ESOPHAGOGASTRODUODENOSCOPY (EGD) WITH PROPOFOL N/A 06/26/2018   Procedure: ESOPHAGOGASTRODUODENOSCOPY (EGD) WITH PROPOFOL;  Surgeon: Virgel Manifold, MD;  Location: [facility];  Service: Endoscopy;  Laterality: N/A;  . EYE SURGERY Bilateral    lasik  . FOOT BONE EXCISION    . IMAGE GUIDED SINUS SURGERY  10/220  . LIPOSUCTION WITH LIPOFILLING Left 04/30/2019   Procedure: Fat grafting to left breast;  Surgeon: Wallace Going, DO;  Location: [facility];  Service: Plastics;  Laterality: Left;  90 min, please  . MASTECTOMY Left 04/08/2019  . MASTECTOMY W/ SENTINEL NODE BIOPSY Left 04/08/2018   Procedure: MASTECTOMY WITH SENTINEL LYMPH NODE BIOPSY LEFT;  Surgeon: Fredirick Maudlin, MD;  Location: [facility];  Service: General;  Laterality: Left;  Marland Kitchen MASTOPEXY Right 08/04/2018   Procedure: MASTOPEXY;  Surgeon: Wallace Going, DO;  Location: [facility];  Service: Plastics;  Laterality: Right;  TOTAL CASE TIME SHOULD BE 3 HOURS, PLEASE  . REMOVAL OF TISSUE EXPANDER AND PLACEMENT OF IMPLANT Left 08/04/2018   Procedure: REMOVAL OF TISSUE EXPANDER AND PLACEMENT OF IMPLANT;  Surgeon: Wallace Going, DO;  Location: [facility];  Service: Plastics;  Laterality: Left;  . SCAR REVISION Left 04/30/2019   Procedure: release of scar contracture to left breast;  Surgeon: Wallace Going, DO;  Location: [place];  Service: Plastics;  Laterality: Left;  . TOTAL HIP ARTHROPLASTY Left 02/11/2020   Procedure: TOTAL HIP ARTHROPLASTY ANTERIOR APPROACH;  Surgeon: Hessie Knows, MD;  Location: [facility];  Service: Orthopedics;  Laterality:  Left;  . UMBILICAL HERNIA REPAIR  1983 and 1985    Family Psychiatric History: I have reviewed family psychiatric history from my progress note on 03/10/2018  Family History:  Family History  Problem Relation Age of Onset  . Hypertension Mother   . Lung cancer Maternal Grandmother   . Heart disease Father   . Alcohol abuse Father   . Breast cancer Neg Hx   . Colon cancer Neg Hx     Social History: Reviewed social history from my progress note on 03/10/2018 Social History   Socioeconomic History  . Marital status: Divorced    Spouse name: Not on file  . Number of children: 1  . Years of education: Not on file  . Highest education level: High school graduate  Occupational History  . Not on file  Tobacco Use  . Smoking status: Former Smoker    Packs/day: 1.00    Years: 2.00    Pack years: 2.00    Types: Cigarettes    Quit date: 01/06/2017    Years since quitting: 3.2  .  Smokeless tobacco: Never Used  Vaping Use  . Vaping Use: Former  . Quit date: 11/27/2016  Substance and Sexual Activity  . Alcohol use: Yes    Comment: rare beer   . Drug use: Not Currently  . Sexual activity: Not on file  Other Topics Concern  . 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 Quit smoking; ocassional alcohol.    Social Determinants of Health   Financial Resource Strain: High Risk  . Difficulty of Paying Living Expenses: Very hard  Food Insecurity: Food Insecurity Present  . Worried About Charity fundraiser in the Last Year: Often true  . Ran Out of Food in the Last Year: Often true  Transportation Needs: Not on file  Physical Activity: Not on file  Stress: Stress Concern Present  . Feeling of Stress : Very much  Social Connections: Not on file    Allergies:  Allergies  Allergen Reactions  . Tape Other (See Comments)    Paper tape after breast surgery/Burned  skin   Tegaderm and other tape OK    Metabolic Disorder Labs: No results found for: HGBA1C, MPG No results found for: PROLACTIN No results found for: CHOL, TRIG, HDL, CHOLHDL, VLDL, LDLCALC No results found for: TSH  Therapeutic Level Labs: No results found for: LITHIUM No results found for: VALPROATE No components found for:  CBMZ  Current Medications: Current Outpatient Medications  Medication Sig Dispense Refill  . traZODone (DESYREL) 50 MG tablet Take 1 tablet (50 mg total) by mouth at bedtime as needed for sleep. 30 tablet 1  . ARIPiprazole (ABILIFY) 5 MG tablet Take 5 mg by mouth daily.    Marland Kitchen atorvastatin (LIPITOR) 40 MG tablet Take 40 mg by mouth at bedtime.     . citalopram (CELEXA) 40 MG tablet TAKE 1 TABLET BY MOUTH EVERY DAY IN THE MORNING 90 tablet 0  . Coenzyme Q10 (COQ10) 100 MG CAPS Take 100 mg by mouth every evening.    Marland Kitchen doxazosin (CARDURA) 1 MG tablet Take 1 tablet (1 mg total) by mouth 2 (two) times daily. Needs office visit for further refills. Thank you! 60 tablet 0  . enoxaparin (LOVENOX) 40 MG/0.4ML injection Inject 0.4 mLs (40 mg total) into the skin daily for 12 days. 4.8 mL 0  . gabapentin (NEURONTIN) 300 MG capsule Take 300 mg by mouth every morning.    . gabapentin (NEURONTIN) 600 MG tablet Take 1 tablet (600 mg total) by mouth as directed. TAKE HALF TABLET IN THE AM AND HALF TABLET AT LUNCH AND 1 TABLET( 600 MG ) AT BEDTIME (Patient taking differently: Take 600 mg by mouth at bedtime.) 180 tablet 0  . GLUCOSAMINE-CHONDROITIN PO Take 1-2 tablets by mouth daily. 1500-1200    . hydrochlorothiazide (HYDRODIURIL) 25 MG tablet Take 25 mg by mouth daily.     . hydrOXYzine (VISTARIL) 50 MG capsule Take 1 capsule (50 mg total) by mouth daily as needed. For sleep, anxiety (Patient taking differently: Take 50 mg by mouth at bedtime. For sleep, anxiety) 90 capsule 0  . ipratropium (ATROVENT) 0.03 % nasal spray Place into both nostrils.    Marland Kitchen levothyroxine (SYNTHROID) 75  MCG tablet Take 75 mcg by mouth every other day. Alternating with 50 mcg every other day-AM    . levothyroxine (SYNTHROID, LEVOTHROID) 50 MCG tablet Take 50 mcg by mouth every other day. Alternating with 75 mcg every  other day-AM    . losartan (COZAAR) 100 MG tablet Take 100 mg by mouth daily. for high blood pressure    . metoprolol (LOPRESSOR) 50 MG tablet Take 1 tablet (50 mg total) by mouth 2 (two) times daily. 60 tablet 0  . Multiple Vitamin (MULTIVITAMIN WITH MINERALS) TABS tablet Take 1 tablet by mouth daily.    . Omega-3 Fatty Acids (FISH OIL PO) Take 1 capsule by mouth daily.    Marland Kitchen omeprazole (PRILOSEC) 20 MG capsule Take 20 mg by mouth every morning.    . ondansetron (ZOFRAN ODT) 4 MG disintegrating tablet Take 1 tablet (4 mg total) by mouth every 8 (eight) hours as needed for nausea or vomiting. 20 tablet 0   No current facility-administered medications for this visit.     Musculoskeletal: Strength & Muscle Tone: UTA Gait & Station: UTA Patient leans: N/A  Psychiatric Specialty Exam: Review of Systems  Musculoskeletal:       S/P hip surgery - pain improving  Psychiatric/Behavioral: Positive for sleep disturbance. The patient is nervous/anxious.   All other systems reviewed and are negative.   Last menstrual period 02/05/2016.There is no height or weight on file to calculate BMI.  General Appearance: Casual  Eye Contact:  Fair  Speech:  Clear and Coherent  Volume:  Normal  Mood:  Anxious  Affect:  Appropriate  Thought Process:  Goal Directed and Descriptions of Associations: Intact  Orientation:  Full (Time, Place, and Person)  Thought Content: Logical   Suicidal Thoughts:  No  Homicidal Thoughts:  No  Memory:  Immediate;   Fair Recent;   Fair Remote;   Fair  Judgement:  Fair  Insight:  Good  Psychomotor Activity:  Normal  Concentration:  Concentration: Fair and Attention Span: Fair  Recall:  [employer] of Knowledge: Fair  Language: Fair  Akathisia:  No   Handed:  Right  AIMS (if indicated): UTA  Assets:  Communication Skills Desire for Improvement Housing Social Support  ADL's:  Intact  Cognition: WNL  Sleep:  Poor   Screenings: Flowsheet Row Admission (Discharged) from 02/11/2020 in [facility] No Risk       Assessment and Plan: Kelli Logan is a 60 year old Caucasian female, divorced, lives in [place], has a history of PTSD, skin picking disorder, panic attacks, social anxiety disorder, breast cancer status post mastectomy, chronic pain was evaluated by telemedicine today.  Patient is biologically predisposed given her family history, history of trauma, multiple health issues.  Patient with psychosocial stressors of recent surgery, death of her mother, financial problems.  Patient is currently struggling with sleep problems as well as anxiety and will benefit from the following plan.  Plan PTSD-unstable Continue Celexa 40 mg p.o. daily Continue Abilify 5 mg p.o. daily Start trazodone 50 mg p.o. nightly as needed for sleep   Social anxiety disorder-unstable Continue CBT.  Patient has established care with therapist here at the practice. Hydroxyzine 50 mg p.o. twice daily as needed Gabapentin as prescribed Celexa 40 mg p.o. daily  Skin picking disorder-unstable Patient is not interested in medication changes today and wants to pursue psychotherapy sessions first.  Cognitive disorder-patient was referred for neuropsychological testing I have reviewed notes per Dr.Zussman -dated 01/26/2020-clinical impression-adult residual type ADHD,'s chronic PTSD, bipolar disorder-current episode unspecified, skin picking disorder, hoarding disorder with Elnora Morrison fair insight, other specified disorders of adult personality and behavior, panic disorder in remission, history of borderline personality disorder. patient advised  to continue CBT/DBT and prolonged exposure in combination  with psychopharmacological intervention.  Habit reversal training for skin picking.  Recommended compliance with CPAP therapy. All her cognitive test results were entirely within normal limits and not indicating of neurocognitive disorder or dementia.'    Discussed lifestyle changes, cutting back on caffeinated drinks as well as reducing the amount of sugary food.  Follow-up in clinic in 3 weeks or sooner if needed.  I have spent atleast 30 minutes with patient today which includes the time spent for preparing to see the patient ( e.g., review of test, records ), obtaining and to review and separately obtained history , ordering medications and test ,psychoeducation and supportive psychotherapy and care coordination,as well as documenting clinical information in electronic health record,interpreting and communication of test results.   This note was generated in part or whole with voice recognition software. Voice recognition is usually quite accurate but there are transcription errors that can and very often do occur. I apologize for any typographical errors that were not detected and corrected.         Ursula Alert, MD 03/23/2020, 9:15 AM

## 2020-03-22 NOTE — Patient Instructions (Signed)
Trazodone Tablets What is this medicine? TRAZODONE (TRAZ oh done) is used to treat depression. This medicine may be used for other purposes; ask your health care provider or pharmacist if you have questions. COMMON BRAND NAME(S): Desyrel What should I tell my health care provider before I take this medicine? They need to know if you have any of these conditions:  attempted suicide or thinking about it  bipolar disorder  bleeding problems  glaucoma  heart disease, or previous heart attack  irregular heart beat  kidney or liver disease  low levels of sodium in the blood  an unusual or allergic reaction to trazodone, other medicines, foods, dyes or preservatives  pregnant or trying to get pregnant  breast-feeding How should I use this medicine? Take this medicine by mouth with a glass of water. Follow the directions on the prescription label. Take this medicine shortly after a meal or a light snack. Take your medicine at regular intervals. Do not take your medicine more often than directed. Do not stop taking this medicine suddenly except upon the advice of your doctor. Stopping this medicine too quickly may cause serious side effects or your condition may worsen. A special MedGuide will be given to you by the pharmacist with each prescription and refill. Be sure to read this information carefully each time. Talk to your pediatrician regarding the use of this medicine in children. Special care may be needed. Overdosage: If you think you have taken too much of this medicine contact a poison control center or emergency room at once. NOTE: This medicine is only for you. Do not share this medicine with others. What if I miss a dose? If you miss a dose, take it as soon as you can. If it is almost time for your next dose, take only that dose. Do not take double or extra doses. What may interact with this medicine? Do not take this medicine with any of the following  medications:  certain medicines for fungal infections like fluconazole, itraconazole, ketoconazole, posaconazole, voriconazole  cisapride  dronedarone  linezolid  MAOIs like Carbex, Eldepryl, Marplan, Nardil, and Parnate  mesoridazine  methylene blue (injected into a vein)  pimozide  saquinavir  thioridazine This medicine may also interact with the following medications:  alcohol  antiviral medicines for HIV or AIDS  aspirin and aspirin-like medicines  barbiturates like phenobarbital  certain medicines for blood pressure, heart disease, irregular heart beat  certain medicines for depression, anxiety, or psychotic disturbances  certain medicines for migraine headache like almotriptan, eletriptan, frovatriptan, naratriptan, rizatriptan, sumatriptan, zolmitriptan  certain medicines for seizures like carbamazepine and phenytoin  certain medicines for sleep  certain medicines that treat or prevent blood clots like dalteparin, enoxaparin, warfarin  digoxin  fentanyl  lithium  NSAIDS, medicines for pain and inflammation, like ibuprofen or naproxen  other medicines that prolong the QT interval (cause an abnormal heart rhythm) like dofetilide  rasagiline  supplements like St. John's wort, kava kava, valerian  tramadol  tryptophan This list may not describe all possible interactions. Give your health care provider a list of all the medicines, herbs, non-prescription drugs, or dietary supplements you use. Also tell them if you smoke, drink alcohol, or use illegal drugs. Some items may interact with your medicine. What should I watch for while using this medicine? Tell your doctor if your symptoms do not get better or if they get worse. Visit your doctor or health care professional for regular checks on your progress. Because it may take   several weeks to see the full effects of this medicine, it is important to continue your treatment as prescribed by your  doctor. Patients and their families should watch out for new or worsening thoughts of suicide or depression. Also watch out for sudden changes in feelings such as feeling anxious, agitated, panicky, irritable, hostile, aggressive, impulsive, severely restless, overly excited and hyperactive, or not being able to sleep. If this happens, especially at the beginning of treatment or after a change in dose, call your health care professional. You may get drowsy or dizzy. Do not drive, use machinery, or do anything that needs mental alertness until you know how this medicine affects you. Do not stand or sit up quickly, especially if you are an older patient. This reduces the risk of dizzy or fainting spells. Alcohol may interfere with the effect of this medicine. Avoid alcoholic drinks. This medicine may cause dry eyes and blurred vision. If you wear contact lenses you may feel some discomfort. Lubricating drops may help. See your eye doctor if the problem does not go away or is severe. Your mouth may get dry. Chewing sugarless gum, sucking hard candy and drinking plenty of water may help. Contact your doctor if the problem does not go away or is severe. What side effects may I notice from receiving this medicine? Side effects that you should report to your doctor or health care professional as soon as possible:  allergic reactions like skin rash, itching or hives, swelling of the face, lips, or tongue  elevated mood, decreased need for sleep, racing thoughts, impulsive behavior  confusion  fast, irregular heartbeat  feeling faint or lightheaded, falls  feeling agitated, angry, or irritable  loss of balance or coordination  painful or prolonged erections  restlessness, pacing, inability to keep still  suicidal thoughts or other mood changes  tremors  trouble sleeping  seizures  unusual bleeding or bruising Side effects that usually do not require medical attention (report to your doctor  or health care professional if they continue or are bothersome):  change in sex drive or performance  change in appetite or weight  constipation  headache  muscle aches or pains  nausea This list may not describe all possible side effects. Call your doctor for medical advice about side effects. You may report side effects to FDA at 1-800-FDA-1088. Where should I keep my medicine? Keep out of the reach of children. Store at room temperature between 15 and 30 degrees C (59 to 86 degrees F). Protect from light. Keep container tightly closed. Throw away any unused medicine after the expiration date. NOTE: This sheet is a summary. It may not cover all possible information. If you have questions about this medicine, talk to your doctor, pharmacist, or health care provider.  2021 Elsevier/Gold Standard (2019-11-16 14:46:11)  

## 2020-03-24 ENCOUNTER — Ambulatory Visit (INDEPENDENT_AMBULATORY_CARE_PROVIDER_SITE_OTHER): Payer: Medicare Other | Admitting: Licensed Clinical Social Worker

## 2020-03-24 ENCOUNTER — Other Ambulatory Visit: Payer: Self-pay

## 2020-03-24 DIAGNOSIS — F4310 Post-traumatic stress disorder, unspecified: Secondary | ICD-10-CM

## 2020-03-24 NOTE — Progress Notes (Signed)
Virtual Visit via Video Note  I connected with Kelli Logan on 03/24/20 at  2:00 PM EDT by a video enabled telemedicine application and verified that I am speaking with the correct person using two identifiers.  Location: Patient: home Provider: [facility]   I discussed the limitations of evaluation and management by telemedicine and the availability of in person appointments. The patient expressed understanding and agreed to proceed.   I discussed the assessment and treatment plan with the patient. The patient was provided an opportunity to ask questions and all were answered. The patient agreed with the plan and demonstrated an understanding of the instructions.   The patient was advised to call back or seek an in-person evaluation if the symptoms worsen or if the condition fails to improve as anticipated.  I provided 45 minutes of non-face-to-face time during this encounter.   Kelli Smithey R Alexandria Shiflett, Kelli Logan    THERAPIST PROGRESS NOTE  Session Time: 2:15-3p  Participation Level: Active  Behavioral Response: NAAlertAnxious  Type of Therapy: Individual Therapy  Treatment Goals addressed: Anxiety  Interventions: CBT  Summary: Kelli Logan is a 60 y.o. female who presents with symptoms associated with PTSD diagnosis. Pt reports that overall mood is stable. Pt reports good quality and quantity of sleep.   Allowed pt to explore and express thoughts and feelings associated with recent life events. Discussed pts current online dating and explored several scenarios. Pt exploring her feelings regarding the dating sites and the individual relationships that she is developing. Discussed health-related concerns and overall psychological impact.  Continued recommendations are as follows: self care behaviors, positive social engagements, focusing on overall work/home/life balance, and focusing on positive physical and emotional wellness.    Suicidal/Homicidal: No  Therapist Response:  Kelli Logan is continuing to develop skills to elevate mood and increase energy and socialization levels. Pt reports fluctuating symptom resolution. Progress intermittent/fluctuating. Treatment to continue.  Plan: Return again in 3 weeks.  Diagnosis: Axis I: Post Traumatic Stress Disorder    Axis II: No diagnosis    Kelli Bo Indi [place], Kelli Logan 03/24/2020

## 2020-03-29 ENCOUNTER — Telehealth: Payer: Self-pay

## 2020-03-29 DIAGNOSIS — F4310 Post-traumatic stress disorder, unspecified: Secondary | ICD-10-CM

## 2020-03-29 MED ORDER — TRAZODONE HCL 50 MG PO TABS: 100.0000 mg | ORAL_TABLET | Freq: Every evening | ORAL | 1 refills | Status: DC | PRN

## 2020-03-29 NOTE — Telephone Encounter (Signed)
Returned call to patient.  She reports she had dreams the first night she tried it.  However last night she could not sleep.  Discussed increasing trazodone to 100 mg for a few nights.  If she is having nightmares or dreams advised her to call me back.  Otherwise she could increase to 150 mg if she is unable to sleep just on the 100 mg.

## 2020-03-29 NOTE — Telephone Encounter (Signed)
pt called left message that the medication you gave her for sleep she wants to try something else. she stated that the 1st 2 nights she dreamed and could remember them. and last night she states that she didnt get any sleep.

## 2020-04-13 ENCOUNTER — Other Ambulatory Visit: Payer: Self-pay

## 2020-04-13 ENCOUNTER — Encounter: Payer: Self-pay | Admitting: Psychiatry

## 2020-04-13 ENCOUNTER — Telehealth (INDEPENDENT_AMBULATORY_CARE_PROVIDER_SITE_OTHER): Payer: Medicare Other | Admitting: Psychiatry

## 2020-04-13 DIAGNOSIS — F424 Excoriation (skin-picking) disorder: Secondary | ICD-10-CM | POA: Diagnosis not present

## 2020-04-13 DIAGNOSIS — Z8659 Personal history of other mental and behavioral disorders: Secondary | ICD-10-CM

## 2020-04-13 DIAGNOSIS — F410 Panic disorder [episodic paroxysmal anxiety] without agoraphobia: Secondary | ICD-10-CM | POA: Diagnosis not present

## 2020-04-13 DIAGNOSIS — F4310 Post-traumatic stress disorder, unspecified: Secondary | ICD-10-CM

## 2020-04-13 DIAGNOSIS — F4010 Social phobia, unspecified: Secondary | ICD-10-CM

## 2020-04-13 DIAGNOSIS — F5101 Primary insomnia: Secondary | ICD-10-CM | POA: Insufficient documentation

## 2020-04-13 MED ORDER — MIRTAZAPINE 15 MG PO TABS: 15.0000 mg | ORAL_TABLET | Freq: Every day | ORAL | 0 refills | Status: DC

## 2020-04-13 NOTE — Progress Notes (Signed)
Virtual Visit via Video Note  I connected with Kelli Logan on 04/13/20 at  2:30 PM EDT by a video enabled telemedicine application and verified that I am speaking with the correct person using two identifiers.  Location Provider Location : [facility] Patient Location : Home  Participants: Patient , Provider   I discussed the limitations of evaluation and management by telemedicine and the availability of in person appointments. The patient expressed understanding and agreed to proceed.     I discussed the assessment and treatment plan with the patient. The patient was provided an opportunity to ask questions and all were answered. The patient agreed with the plan and demonstrated an understanding of the instructions.   The patient was advised to call back or seek an in-person evaluation if the symptoms worsen or if the condition fails to improve as anticipated.  [place] MD OP Progress Note  04/13/2020 2:59 PM Keyana Guevara  MRN:  [id number]  Chief Complaint:  Chief Complaint    Follow-up; Anxiety; Insomnia     HPI: Kelli Logan is a 60 year old Caucasian female, divorced, lives in [place], has a history of PTSD, skin picking disorder, social anxiety disorder, history of borderline personality disorder, cognitive disorder, history of breast cancer, recent total hip replacement surgery was evaluated by telemedicine today.  Patient reports she is currently making progress with her anxiety symptoms.  She has not had any significant mood swings.  She stopped taking the Abilify since she did not believe it was helpful.  She reports she did have a discussion about this with her neurologist.    She reports her nailbiting behavior has improved.  Patient however currently struggles with sleep.  She reports she tried going up on the trazodone however did not like the effect.  She reports it helped her to sleep initially however then it stopped working and it made her too groggy and also  made her to have weird dreams.  She ran out of the trazodone few days ago.  She reports she does not want to go back on it.  Patient denies any suicidality, homicidality or perceptual disturbances.  Patient denies any other concerns today.  Visit Diagnosis:    ICD-10-CM   1. PTSD (post-traumatic stress disorder)  F43.10 mirtazapine (REMERON) 15 MG tablet  2. Skin-picking disorder  F42.4   3. Panic disorder  F41.0   4. Social anxiety disorder  F40.10   5. H/O borderline personality disorder  Z86.59     Past Psychiatric History: I have reviewed past psychiatric history from my progress note on 03/10/2018.  Past trials of Paxil, Prozac, Zoloft, Lamictal, Depakote, Klonopin, Seroquel  Past Medical History:  Past Medical History:  Diagnosis Date  . Anxiety   . Arthritis   . Cancer [facility])    breast left  . Depression   . Fibromyalgia   . GERD (gastroesophageal reflux disease)   . Headache   . History of kidney stones    h/o  . Hypertension   . Hypothyroidism   . Sleep apnea    DOES NOT USE CPAP  . Thyroid disease     Past Surgical History:  Procedure Laterality Date  . APPENDECTOMY    . BREAST BIOPSY Left few yrs ago   stereotactic bx in [place], benign  . BREAST BIOPSY Left 12/26/2017   Affirm Bx [facility]- X-Clip  . BREAST BIOPSY Left 01/14/2018   Affirm Bx- Coil clip- path pending  . BREAST RECONSTRUCTION WITH PLACEMENT OF TISSUE EXPANDER  AND ALLODERM Left 04/08/2018   Procedure: LEFT BREAST IMMEDIATE  RECONSTRUCTION WITH EXPANDER AND FLEX HD;  Surgeon: Wallace Going, DO;  Location: [facility] ORS;  Service: Plastics;  Laterality: Left;  . COLONOSCOPY WITH PROPOFOL N/A 06/26/2018   Procedure: COLONOSCOPY WITH PROPOFOL;  Surgeon: Virgel Manifold, MD;  Location: [facility];  Service: Endoscopy;  Laterality: N/A;  . ESOPHAGOGASTRODUODENOSCOPY (EGD) WITH PROPOFOL N/A 06/26/2018   Procedure: ESOPHAGOGASTRODUODENOSCOPY (EGD) WITH PROPOFOL;  Surgeon: Virgel Manifold, MD;   Location: [facility] END[facility];  Service: Endoscopy;  Laterality: N/A;  . EYE SURGERY Bilateral    lasik  . FOOT BONE EXCISION    . IMAGE GUIDED SINUS SURGERY  10/220  . LIPOSUCTION WITH LIPOFILLING Left 04/30/2019   Procedure: Fat grafting to left breast;  Surgeon: Wallace Going, DO;  Location: [facility];  Service: Plastics;  Laterality: Left;  90 min, please  . MASTECTOMY Left 04/08/2019  . MASTECTOMY W/ SENTINEL NODE BIOPSY Left 04/08/2018   Procedure: MASTECTOMY WITH SENTINEL LYMPH NODE BIOPSY LEFT;  Surgeon: Fredirick Maudlin, MD;  Location: [facility];  Service: General;  Laterality: Left;  Marland Kitchen MASTOPEXY Right 08/04/2018   Procedure: MASTOPEXY;  Surgeon: Wallace Going, DO;  Location: [facility];  Service: Plastics;  Laterality: Right;  TOTAL CASE TIME SHOULD BE 3 HOURS, PLEASE  . REMOVAL OF TISSUE EXPANDER AND PLACEMENT OF IMPLANT Left 08/04/2018   Procedure: REMOVAL OF TISSUE EXPANDER AND PLACEMENT OF IMPLANT;  Surgeon: Wallace Going, DO;  Location: [facility];  Service: Plastics;  Laterality: Left;  . SCAR REVISION Left 04/30/2019   Procedure: release of scar contracture to left breast;  Surgeon: Wallace Going, DO;  Location: [facility];  Service: Plastics;  Laterality: Left;  . TOTAL HIP ARTHROPLASTY Left 02/11/2020   Procedure: TOTAL HIP ARTHROPLASTY ANTERIOR APPROACH;  Surgeon: Hessie Knows, MD;  Location: [facility];  Service: Orthopedics;  Laterality: Left;  . UMBILICAL HERNIA REPAIR  1983 and 1985    Family Psychiatric History: I have reviewed family psychiatric history from my progress note on 03/10/2018  Family History:  Family History  Problem Relation Age of Onset  . Hypertension Mother   . Lung cancer Maternal Grandmother   . Heart disease Father   . Alcohol abuse Father   . Breast cancer Neg Hx   . Colon cancer Neg Hx     Social History: I have reviewed social history from my progress note on 03/10/2018 Social History    Socioeconomic History  . Marital status: Divorced    Spouse name: Not on file  . Number of children: 1  . Years of education: Not on file  . Highest education level: High school graduate  Occupational History  . Not on file  Tobacco Use  . Smoking status: Former Smoker    Packs/day: 1.00    Years: 2.00    Pack years: 2.00    Types: Cigarettes    Quit date: 01/06/2017    Years since quitting: 3.2  . Smokeless tobacco: Never Used  Vaping Use  . Vaping Use: Former  . Quit date: 11/27/2016  Substance and Sexual Activity  . Alcohol use: Yes    Comment: rare beer   . Drug use: Not Currently  . Sexual activity: Not on file  Other Topics Concern  . 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 Quit smoking; ocassional  alcohol.    Social Determinants of Health   Financial Resource Strain: High Risk  . Difficulty of Paying Living Expenses: Very hard  Food Insecurity: Food Insecurity Present  . Worried About Charity fundraiser in the Last Year: Often true  . Ran Out of Food in the Last Year: Often true  Transportation Needs: Not on file  Physical Activity: Not on file  Stress: Stress Concern Present  . Feeling of Stress : Very much  Social Connections: Not on file    Allergies:  Allergies  Allergen Reactions  . Tape Other (See Comments)    Paper tape after breast surgery/Burned skin   Tegaderm and other tape OK    Metabolic Disorder Labs: No results found for: HGBA1C, MPG No results found for: PROLACTIN No results found for: CHOL, TRIG, HDL, CHOLHDL, VLDL, LDLCALC No results found for: TSH  Therapeutic Level Labs: No results found for: LITHIUM No results found for: VALPROATE No components found for:  CBMZ  Current Medications: Current Outpatient Medications  Medication Sig Dispense Refill  . mirtazapine (REMERON) 15 MG tablet Take 1 tablet  (15 mg total) by mouth at bedtime. FOR SLEEP 30 tablet 0  . atorvastatin (LIPITOR) 40 MG tablet Take 40 mg by mouth at bedtime.     . citalopram (CELEXA) 40 MG tablet TAKE 1 TABLET BY MOUTH EVERY DAY IN THE MORNING 90 tablet 0  . Coenzyme Q10 (COQ10) 100 MG CAPS Take 100 mg by mouth every evening.    Marland Kitchen doxazosin (CARDURA) 1 MG tablet Take 1 tablet (1 mg total) by mouth 2 (two) times daily. Needs office visit for further refills. Thank you! 60 tablet 0  . enoxaparin (LOVENOX) 40 MG/0.4ML injection Inject 0.4 mLs (40 mg total) into the skin daily for 12 days. 4.8 mL 0  . gabapentin (NEURONTIN) 300 MG capsule Take 300 mg by mouth every morning.    . gabapentin (NEURONTIN) 600 MG tablet Take 1 tablet (600 mg total) by mouth as directed. TAKE HALF TABLET IN THE AM AND HALF TABLET AT LUNCH AND 1 TABLET( 600 MG ) AT BEDTIME (Patient taking differently: Take 600 mg by mouth at bedtime.) 180 tablet 0  . GLUCOSAMINE-CHONDROITIN PO Take 1-2 tablets by mouth daily. 1500-1200    . hydrochlorothiazide (HYDRODIURIL) 25 MG tablet Take 25 mg by mouth daily.     . hydrOXYzine (VISTARIL) 50 MG capsule Take 1 capsule (50 mg total) by mouth daily as needed. For sleep, anxiety (Patient taking differently: Take 50 mg by mouth at bedtime. For sleep, anxiety) 90 capsule 0  . ipratropium (ATROVENT) 0.03 % nasal spray Place into both nostrils.    Marland Kitchen levothyroxine (SYNTHROID) 75 MCG tablet Take 75 mcg by mouth every other day. Alternating with 50 mcg every other day-AM    . levothyroxine (SYNTHROID, LEVOTHROID) 50 MCG tablet Take 50 mcg by mouth every other day. Alternating with 75 mcg every other day-AM    . losartan (COZAAR) 100 MG tablet Take 100 mg by mouth daily. for high blood pressure    . metoprolol (LOPRESSOR) 50 MG tablet Take 1 tablet (50 mg total) by mouth 2 (two) times daily. 60 tablet 0  . Multiple Vitamin (MULTIVITAMIN WITH MINERALS) TABS tablet Take 1 tablet by mouth daily.    . Omega-3 Fatty Acids (FISH OIL PO)  Take 1 capsule by mouth daily.    Marland Kitchen omeprazole (PRILOSEC) 20 MG capsule Take 20 mg by mouth every morning.    . ondansetron [facility]  ODT) 4 MG disintegrating tablet Take 1 tablet (4 mg total) by mouth every 8 (eight) hours as needed for nausea or vomiting. 20 tablet 0   No current facility-administered medications for this visit.     Musculoskeletal: Strength & Muscle Tone: UTA Gait & Station: UTA Patient leans: N/A  Psychiatric Specialty Exam: Review of Systems  Musculoskeletal:       Hip pain -improving  Psychiatric/Behavioral: Positive for sleep disturbance. The patient is nervous/anxious.   All other systems reviewed and are negative.   Last menstrual period 02/05/2016.There is no height or weight on file to calculate BMI.  General Appearance: Casual  Eye Contact:  Fair  Speech:  Normal Rate  Volume:  Normal  Mood:  Anxious  Affect:  Congruent  Thought Process:  Goal Directed and Descriptions of Associations: Intact  Orientation:  Full (Time, Place, and Person)  Thought Content: Logical   Suicidal Thoughts:  No  Homicidal Thoughts:  No  Memory:  Immediate;   Fair Recent;   Fair Remote;   Fair  Judgement:  Fair  Insight:  Fair  Psychomotor Activity:  Normal  Concentration:  Concentration: Fair and Attention Span: Fair  Recall:  [employer] of Knowledge: Fair  Language: Fair  Akathisia:  No  Handed:  Right  AIMS (if indicated): UTA  Assets:  Communication Skills Desire for Improvement Housing Social Support  ADL's:  Intact  Cognition: WNL  Sleep:  Poor   Screenings: Science writer from 03/24/2020 in [place]  PHQ-2 Total Score 1    [place] from 03/24/2020 in [place] Admission (Discharged) from 02/11/2020 in [place] CATEGORY No Risk No Risk       Assessment and Plan: Haidy Kackley is a 60 year old  Caucasian female, divorced, lives in [place], has a history of PTSD, skin picking disorder, panic attacks, social anxiety disorder, multiple other medical problems was evaluated by telemedicine today.  Patient with multiple psychosocial stressors including health problems, pain, financial problems.  She continues to struggle with sleep and had side effects to trazodone as well as has been noncompliant with Abilify.  She will benefit from the following plan.  Plan PTSD-improving Celexa 40 mg p.o. daily Discontinue trazodone for side effects and lack of benefit Start mirtazapine 15 mg p.o. nightly  Social anxiety disorder-improving Continue CBT. Hydroxyzine 50 mg p.o. twice daily as needed  Skin picking disorder-improving Continue CBT  Follow-up in clinic in 2 to 3 weeks or sooner if needed.  Patient also has an in person visit scheduled for May since she wants to come into the office.  This note was generated in part or whole with voice recognition software. Voice recognition is usually quite accurate but there are transcription errors that can and very often do occur. I apologize for any typographical errors that were not detected and corrected.      Ursula Alert, MD 04/14/2020, 10:00 AM

## 2020-04-13 NOTE — Patient Instructions (Signed)
Mirtazapine tablets What is this medicine? MIRTAZAPINE (mir TAZ a peen) is used to treat depression. This medicine may be used for other purposes; ask your health care provider or pharmacist if you have questions. COMMON BRAND NAME(S): Remeron What should I tell my health care provider before I take this medicine? They need to know if you have any of these conditions:  bipolar disorder  glaucoma  kidney disease  liver disease  suicidal thoughts  an unusual or allergic reaction to mirtazapine, other medicines, foods, dyes, or preservatives  pregnant or trying to get pregnant  breast-feeding How should I use this medicine? Take this medicine by mouth with a glass of water. Follow the directions on the prescription label. Take your medicine at regular intervals. Do not take your medicine more often than directed. Do not stop taking this medicine suddenly except upon the advice of your doctor. Stopping this medicine too quickly may cause serious side effects or your condition may worsen. A special MedGuide will be given to you by the pharmacist with each prescription and refill. Be sure to read this information carefully each time. Talk to your pediatrician regarding the use of this medicine in children. Special care may be needed. Overdosage: If you think you have taken too much of this medicine contact a poison control center or emergency room at once. NOTE: This medicine is only for you. Do not share this medicine with others. What if I miss a dose? If you miss a dose, take it as soon as you can. If it is almost time for your next dose, take only that dose. Do not take double or extra doses. What may interact with this medicine? Do not take this medicine with any of the following medications:  linezolid  MAOIs like Carbex, Eldepryl, Marplan, Nardil, and Parnate  methylene blue (injected into a vein) This medicine may also interact with the following  medications:  alcohol  antiviral medicines for HIV or AIDS  certain medicines that treat or prevent blood clots like warfarin  certain medicines for depression, anxiety, or psychotic disturbances  certain medicines for fungal infections like ketoconazole and itraconazole  certain medicines for migraine headache like almotriptan, eletriptan, frovatriptan, naratriptan, rizatriptan, sumatriptan, zolmitriptan  certain medicines for seizures like carbamazepine or phenytoin  certain medicines for sleep  cimetidine  erythromycin  fentanyl  lithium  medicines for blood pressure  nefazodone  rasagiline  rifampin  supplements like St. John's wort, kava kava, valerian  tramadol  tryptophan This list may not describe all possible interactions. Give your health care provider a list of all the medicines, herbs, non-prescription drugs, or dietary supplements you use. Also tell them if you smoke, drink alcohol, or use illegal drugs. Some items may interact with your medicine. What should I watch for while using this medicine? Tell your doctor if your symptoms do not get better or if they get worse. Visit your doctor or health care professional for regular checks on your progress. Because it may take several weeks to see the full effects of this medicine, it is important to continue your treatment as prescribed by your doctor. Patients and their families should watch out for new or worsening thoughts of suicide or depression. Also watch out for sudden changes in feelings such as feeling anxious, agitated, panicky, irritable, hostile, aggressive, impulsive, severely restless, overly excited and hyperactive, or not being able to sleep. If this happens, especially at the beginning of treatment or after a change in dose, call your health   care professional. You may get drowsy or dizzy. Do not drive, use machinery, or do anything that needs mental alertness until you know how this medicine  affects you. Do not stand or sit up quickly, especially if you are an older patient. This reduces the risk of dizzy or fainting spells. Alcohol may interfere with the effect of this medicine. Avoid alcoholic drinks. This medicine may cause dry eyes and blurred vision. If you wear contact lenses you may feel some discomfort. Lubricating drops may help. See your eye doctor if the problem does not go away or is severe. Your mouth may get dry. Chewing sugarless gum or sucking hard candy, and drinking plenty of water may help. Contact your doctor if the problem does not go away or is severe. What side effects may I notice from receiving this medicine? Side effects that you should report to your doctor or health care professional as soon as possible:  allergic reactions like skin rash, itching or hives, swelling of the face, lips, or tongue  anxious  changes in vision  chest pain  confusion  elevated mood, decreased need for sleep, racing thoughts, impulsive behavior  eye pain  fast, irregular heartbeat  feeling faint or lightheaded, falls  feeling agitated, angry, or irritable  fever or chills, sore throat  hallucination, loss of contact with reality  loss of balance or coordination  mouth sores  redness, blistering, peeling or loosening of the skin, including inside the mouth  restlessness, pacing, inability to keep still  seizures  stiff muscles  suicidal thoughts or other mood changes  trouble passing urine or change in the amount of urine  trouble sleeping  unusual bleeding or bruising  unusually weak or tired  vomiting Side effects that usually do not require medical attention (report to your doctor or health care professional if they continue or are bothersome):  change in appetite  constipation  dizziness  dry mouth  muscle aches or pains  nausea  tired  weight gain This list may not describe all possible side effects. Call your doctor for  medical advice about side effects. You may report side effects to FDA at 1-800-FDA-1088. Where should I keep my medicine? Keep out of the reach of children. Store at room temperature between 15 and 30 degrees C (59 and 86 degrees F) Protect from light and moisture. Throw away any unused medicine after the expiration date. NOTE: This sheet is a summary. It may not cover all possible information. If you have questions about this medicine, talk to your doctor, pharmacist, or health care provider.  2021 Elsevier/Gold Standard (2015-05-26 17:30:45)  

## 2020-04-19 ENCOUNTER — Ambulatory Visit (INDEPENDENT_AMBULATORY_CARE_PROVIDER_SITE_OTHER): Payer: Medicare Other | Admitting: Licensed Clinical Social Worker

## 2020-04-19 ENCOUNTER — Other Ambulatory Visit: Payer: Self-pay

## 2020-04-19 DIAGNOSIS — F4310 Post-traumatic stress disorder, unspecified: Secondary | ICD-10-CM

## 2020-04-19 NOTE — Progress Notes (Signed)
Virtual Visit via Audio  Note  I connected with Tyara Dassow Kelter on 04/19/20 at  1:00 PM EDT by an audio enabled telemedicine application and verified that I am speaking with the correct person using two identifiers.  Location: Patient: home Provider: remote office [place], [place])   I discussed the limitations of evaluation and management by telemedicine and the availability of in person appointments. The patient expressed understanding and agreed to proceed.   I discussed the assessment and treatment plan with the patient. The patient was provided an opportunity to ask questions and all were answered. The patient agreed with the plan and demonstrated an understanding of the instructions.   The patient was advised to call back or seek an in-person evaluation if the symptoms worsen or if the condition fails to improve as anticipated.  I provided 60 minutes of non-face-to-face time during this encounter.   Sybel Standish R Mara Favero, LCSW    THERAPIST PROGRESS NOTE  Session Time: 1-2p Participation Level: Active  Behavioral Response: NAAlertAnxious and Depressed  Type of Therapy: Individual Therapy  Treatment Goals addressed: Anxiety and Coping  Interventions: CBT, Solution Focused, Strength-based and Supportive  Summary: Rodneshia Greenhouse is a 60 y.o. female who presents with improving symptoms related to PTSD diagnosis. Pt reports that mood has been stable overall with a few isolated days of feeling depressed. Pt reports that she is able to manage situational anxiety/stress well.   Allowed pt to explore current dating app relationships and several female friendships that she has currently. Allowed pt to share her thoughts and feelings about relationships and what she is hoping to find.   Explored current health-related concerns and overall psychological impact. Pt feels good about post-operative healing (hip) and is moving around well.   Continued recommendations are as follows:  self care behaviors, positive social engagements, focusing on overall work/home/life balance, and focusing on positive physical and emotional wellness.    Suicidal/Homicidal: No  Therapist Response: Shaili is continuing to identify conflicts from the past and present tat form the basis for present anxiety. Ginette is able to describe current and past experiences with specific fears, prominent worries, and anxiety symptoms including impact on functioning and attempts to resolve it. Mirian is good about identifying coping skills that have been helpful in the past and utilizing them. These behaviors are reflective of both personal growth and progress. Treatment to continue as indicated.  Plan: Return again in 4 weeks.  Diagnosis: Axis I: Post Traumatic Stress Disorder    Axis II: No diagnosis    Rachel Bo Donis Kotowski, LCSW 04/19/2020

## 2020-04-29 DIAGNOSIS — Z6833 Body mass index (BMI) 33.0-33.9, adult: Secondary | ICD-10-CM | POA: Insufficient documentation

## 2020-05-04 ENCOUNTER — Other Ambulatory Visit: Payer: Self-pay

## 2020-05-04 ENCOUNTER — Encounter: Payer: Self-pay | Admitting: Psychiatry

## 2020-05-04 ENCOUNTER — Telehealth (INDEPENDENT_AMBULATORY_CARE_PROVIDER_SITE_OTHER): Payer: Medicare Other | Admitting: Psychiatry

## 2020-05-04 DIAGNOSIS — F424 Excoriation (skin-picking) disorder: Secondary | ICD-10-CM

## 2020-05-04 DIAGNOSIS — F4310 Post-traumatic stress disorder, unspecified: Secondary | ICD-10-CM | POA: Diagnosis not present

## 2020-05-04 DIAGNOSIS — F410 Panic disorder [episodic paroxysmal anxiety] without agoraphobia: Secondary | ICD-10-CM | POA: Diagnosis not present

## 2020-05-04 DIAGNOSIS — F4010 Social phobia, unspecified: Secondary | ICD-10-CM | POA: Diagnosis not present

## 2020-05-04 DIAGNOSIS — Z8659 Personal history of other mental and behavioral disorders: Secondary | ICD-10-CM

## 2020-05-04 MED ORDER — MIRTAZAPINE 15 MG PO TABS: 15.0000 mg | ORAL_TABLET | Freq: Every day | ORAL | 1 refills | Status: DC

## 2020-05-04 NOTE — Progress Notes (Signed)
Virtual Visit via Video Note  I connected with [name] on 05/04/20 at  1:40 PM EDT by a video enabled telemedicine application and verified that I am speaking with the correct person using two identifiers.  Location Provider Location : [facility] Patient Location : Home  Participants: Patient , Provider   I discussed the limitations of evaluation and management by telemedicine and the availability of in person appointments. The patient expressed understanding and agreed to proceed.   I discussed the assessment and treatment plan with the patient. The patient was provided an opportunity to ask questions and all were answered. The patient agreed with the plan and demonstrated an understanding of the instructions.   The patient was advised to call back or seek an in-person evaluation if the symptoms worsen or if the condition fails to improve as anticipated.   [place] MD OP Progress Note  05/04/2020 1:55 PM [name]  MRN:  [id number]  Chief Complaint:  Chief Complaint    Follow-up; Anxiety     HPI: Kelli Logan is a 60 year old Caucasian female, divorced, lives in [place], has a history of skin picking disorder, PTSD, social anxiety disorder, history of borderline personality disorder, cognitive disorder, history of breast cancer, recent total hip replacement surgery was evaluated by telemedicine today.  Patient reports she is able to be more active and does many more things around the house.    Patient reports she continues to have pain of her hip, right shoulder blade, however her neurologist started her on nortriptyline which she may have tried in the past and it did not work at that time.  She reports she hence did not start taking it and has been trying to get in touch with neurologist.  She reports she wants to continue mirtazapine since that has been beneficial.  It helps her to sleep and she wakes up feeling rested.  She does not have any side effects from it at  this time.  Patient denies any suicidality, homicidality or perceptual disturbances.  Patient denies any other concerns today.  Visit Diagnosis:    ICD-10-CM   1. PTSD (post-traumatic stress disorder)  F43.10 mirtazapine (REMERON) 15 MG tablet  2. Skin-picking disorder  F42.4   3. Panic disorder  F41.0   4. Social anxiety disorder  F40.10   5. H/O borderline personality disorder  Z86.59     Past Psychiatric History: I have reviewed past psychiatric history from progress note on 03/10/2018.  Past trials of Paxil, Prozac, Zoloft, Lamictal, Depakote, Klonopin, Seroquel  Past Medical History:  Past Medical History:  Diagnosis Date  . Anxiety   . Arthritis   . Cancer [facility])    breast left  . Depression   . Fibromyalgia   . GERD (gastroesophageal reflux disease)   . Headache   . History of kidney stones    h/o  . Hypertension   . Hypothyroidism   . Sleep apnea    DOES NOT USE CPAP  . Thyroid disease     Past Surgical History:  Procedure Laterality Date  . APPENDECTOMY    . BREAST BIOPSY Left few yrs ago   stereotactic bx in [place], benign  . BREAST BIOPSY Left 12/26/2017   Affirm Bx IMC- X-Clip  . BREAST BIOPSY Left 01/14/2018   Affirm Bx- Coil clip- path pending  . BREAST RECONSTRUCTION WITH PLACEMENT OF TISSUE EXPANDER AND ALLODERM Left 04/08/2018   Procedure: LEFT BREAST IMMEDIATE  RECONSTRUCTION WITH EXPANDER AND FLEX HD;  Surgeon:  Dillingham, Loel Lofty, DO;  Location: [facility];  Service: Plastics;  Laterality: Left;  . COLONOSCOPY WITH PROPOFOL N/A 06/26/2018   Procedure: COLONOSCOPY WITH PROPOFOL;  Surgeon: Virgel Manifold, MD;  Location: [facility];  Service: Endoscopy;  Laterality: N/A;  . ESOPHAGOGASTRODUODENOSCOPY (EGD) WITH PROPOFOL N/A 06/26/2018   Procedure: ESOPHAGOGASTRODUODENOSCOPY (EGD) WITH PROPOFOL;  Surgeon: Virgel Manifold, MD;  Location: [facility];  Service: Endoscopy;  Laterality: N/A;  . EYE SURGERY Bilateral    lasik  . FOOT BONE  EXCISION    . IMAGE GUIDED SINUS SURGERY  10/220  . LIPOSUCTION WITH LIPOFILLING Left 04/30/2019   Procedure: Fat grafting to left breast;  Surgeon: Wallace Going, DO;  Location: [facility];  Service: Plastics;  Laterality: Left;  90 min, please  . MASTECTOMY Left 04/08/2019  . MASTECTOMY W/ SENTINEL NODE BIOPSY Left 04/08/2018   Procedure: MASTECTOMY WITH SENTINEL LYMPH NODE BIOPSY LEFT;  Surgeon: Fredirick Maudlin, MD;  Location: [facility];  Service: General;  Laterality: Left;  Marland Kitchen MASTOPEXY Right 08/04/2018   Procedure: MASTOPEXY;  Surgeon: Wallace Going, DO;  Location: [facility];  Service: Plastics;  Laterality: Right;  TOTAL CASE TIME SHOULD BE 3 HOURS, PLEASE  . REMOVAL OF TISSUE EXPANDER AND PLACEMENT OF IMPLANT Left 08/04/2018   Procedure: REMOVAL OF TISSUE EXPANDER AND PLACEMENT OF IMPLANT;  Surgeon: Wallace Going, DO;  Location: [facility];  Service: Plastics;  Laterality: Left;  . SCAR REVISION Left 04/30/2019   Procedure: release of scar contracture to left breast;  Surgeon: Wallace Going, DO;  Location: [facility];  Service: Plastics;  Laterality: Left;  . TOTAL HIP ARTHROPLASTY Left 02/11/2020   Procedure: TOTAL HIP ARTHROPLASTY ANTERIOR APPROACH;  Surgeon: Hessie Knows, MD;  Location: [facility];  Service: Orthopedics;  Laterality: Left;  . UMBILICAL HERNIA REPAIR  1983 and 1985    Family Psychiatric History: I have reviewed family psychiatric history from my progress note on 03/10/2018  Family History:  Family History  Problem Relation Age of Onset  . Hypertension Mother   . Lung cancer Maternal Grandmother   . Heart disease Father   . Alcohol abuse Father   . Breast cancer Neg Hx   . Colon cancer Neg Hx     Social History: Reviewed social history from progress note on 03/10/2018 Social History   Socioeconomic History  . Marital status: Divorced    Spouse name: Not on file  . Number of children: 1  . Years of education: Not  on file  . Highest education level: High school graduate  Occupational History  . Not on file  Tobacco Use  . Smoking status: Former Smoker    Packs/day: 1.00    Years: 2.00    Pack years: 2.00    Types: Cigarettes    Quit date: 01/06/2017    Years since quitting: 3.3  . Smokeless tobacco: Never Used  Vaping Use  . Vaping Use: Former  . Quit date: 11/27/2016  Substance and Sexual Activity  . Alcohol use: Yes    Comment: rare beer   . Drug use: Not Currently  . Sexual activity: Not on file  Other Topics Concern  . 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 Quit smoking; ocassional alcohol.    Social Determinants of Health   Financial Resource Strain: High Risk  . Difficulty of Paying Living Expenses:  Very hard  Food Insecurity: Food Insecurity Present  . Worried About Charity fundraiser in the Last Year: Often true  . Ran Out of Food in the Last Year: Often true  Transportation Needs: Not on file  Physical Activity: Not on file  Stress: Stress Concern Present  . Feeling of Stress : Very much  Social Connections: Not on file    Allergies:  Allergies  Allergen Reactions  . Tape Other (See Comments)    Paper tape after breast surgery/Burned skin   Tegaderm and other tape OK    Metabolic Disorder Labs: No results found for: HGBA1C, MPG No results found for: PROLACTIN No results found for: CHOL, TRIG, HDL, CHOLHDL, VLDL, LDLCALC No results found for: TSH  Therapeutic Level Labs: No results found for: LITHIUM No results found for: VALPROATE No components found for:  CBMZ  Current Medications: Current Outpatient Medications  Medication Sig Dispense Refill  . nortriptyline (PAMELOR) 10 MG capsule Start Nortriptyline (Pamelor) 10 mg nightly for one week, then increase to 20 mg nightly    . atorvastatin (LIPITOR) 40 MG tablet Take 40 mg by mouth  at bedtime.     Marland Kitchen azithromycin (ZITHROMAX) 500 MG tablet Take 1,000 mg by mouth once.    . Cephalexin 500 MG tablet Take 500 mg by mouth 3 (three) times daily.    . citalopram (CELEXA) 40 MG tablet TAKE 1 TABLET BY MOUTH EVERY DAY IN THE MORNING 90 tablet 0  . Coenzyme Q10 (COQ10) 100 MG CAPS Take 100 mg by mouth every evening.    Marland Kitchen DIFLUCAN 150 MG tablet Take by mouth.    . doxazosin (CARDURA) 1 MG tablet Take 1 tablet (1 mg total) by mouth 2 (two) times daily. Needs office visit for further refills. Thank you! 60 tablet 0  . enoxaparin (LOVENOX) 40 MG/0.4ML injection Inject 0.4 mLs (40 mg total) into the skin daily for 12 days. 4.8 mL 0  . gabapentin (NEURONTIN) 300 MG capsule Take 300 mg by mouth every morning.    . gabapentin (NEURONTIN) 600 MG tablet Take 1 tablet (600 mg total) by mouth as directed. TAKE HALF TABLET IN THE AM AND HALF TABLET AT LUNCH AND 1 TABLET( 600 MG ) AT BEDTIME (Patient taking differently: Take 600 mg by mouth at bedtime.) 180 tablet 0  . GLUCOSAMINE-CHONDROITIN PO Take 1-2 tablets by mouth daily. 1500-1200    . hydrochlorothiazide (HYDRODIURIL) 25 MG tablet Take 25 mg by mouth daily.     . hydrOXYzine (VISTARIL) 50 MG capsule Take 1 capsule (50 mg total) by mouth daily as needed. For sleep, anxiety (Patient taking differently: Take 50 mg by mouth at bedtime. For sleep, anxiety) 90 capsule 0  . ipratropium (ATROVENT) 0.03 % nasal spray Place into both nostrils.    Marland Kitchen levothyroxine (SYNTHROID) 75 MCG tablet Take 75 mcg by mouth every other day. Alternating with 50 mcg every other day-AM    . levothyroxine (SYNTHROID, LEVOTHROID) 50 MCG tablet Take 50 mcg by mouth every other day. Alternating with 75 mcg every other day-AM    . losartan (COZAAR) 100 MG tablet Take 100 mg by mouth daily. for high blood pressure    . metoprolol (LOPRESSOR) 50 MG tablet Take 1 tablet (50 mg total) by mouth 2 (two) times daily. 60 tablet 0  . mirtazapine (REMERON) 15 MG tablet Take 1 tablet  (15 mg total) by mouth at bedtime. FOR SLEEP 30 tablet 1  . Multiple Vitamin (MULTIVITAMIN WITH MINERALS) TABS  tablet Take 1 tablet by mouth daily.    . Omega-3 Fatty Acids (FISH OIL PO) Take 1 capsule by mouth daily.    Marland Kitchen omeprazole (PRILOSEC) 20 MG capsule Take 20 mg by mouth every morning.    . ondansetron (ZOFRAN ODT) 4 MG disintegrating tablet Take 1 tablet (4 mg total) by mouth every 8 (eight) hours as needed for nausea or vomiting. 20 tablet 0   No current facility-administered medications for this visit.     Musculoskeletal: Strength & Muscle Tone: uta Gait & Station: UTA Patient leans: N/A  Psychiatric Specialty Exam: Review of Systems  Musculoskeletal:       Hip pain - Left  Shoulder blade right sided   Psychiatric/Behavioral: Positive for sleep disturbance (Improving).  All other systems reviewed and are negative.   Last menstrual period 02/05/2016.There is no height or weight on file to calculate BMI.  General Appearance: Fairly Groomed  Eye Contact:  Fair  Speech:  Clear and Coherent  Volume:  Normal  Mood:  Euthymic  Affect:  Congruent  Thought Process:  Goal Directed and Descriptions of Associations: Intact  Orientation:  Full (Time, Place, and Person)  Thought Content: Logical   Suicidal Thoughts:  No  Homicidal Thoughts:  No  Memory:  Immediate;   Fair Recent;   Fair Remote;   Fair  Judgement:  Fair  Insight:  Fair  Psychomotor Activity:  Normal  Concentration:  Concentration: Fair and Attention Span: Fair  Recall:  [employer] of Knowledge: Fair  Language: Fair  Akathisia:  No  Handed:  Right  AIMS (if indicated): UTA  Assets:  Communication Skills Desire for Improvement Housing Social Support  ADL's:  Intact  Cognition: WNL  Sleep:  Improved   Screenings: Science writer from 03/24/2020 in [facility]  PHQ-2 Total Score 1    [place] from 03/24/2020 in [facility] Admission (Discharged) from 02/11/2020 in [facility] CATEGORY No Risk No Risk       Assessment and Plan: Mackinze Criado is a 60 year old Caucasian female, divorced, lives in [place], has a history of PTSD, skin picking disorder, panic attacks, social anxiety disorder, multiple other medical problems was evaluated by telemedicine today.  Patient with multiple psychosocial stressors including health problems, pain, financial problems.  Patient is currently making progress with regards to her mood and sleep.  She will benefit from the following plan.  Plan PTSD- improving Celexa 40 mg p.o. daily Mirtazapine 15 mg p.o. nightly  Social anxiety disorder-improving Continue CBT Hydroxyzine 50 mg p.o. twice daily as needed  Skin picking disorder-improving Continue CBT  Follow-up in clinic in 2 months or sooner if needed.  Patient will call back for an appointment.  This note was generated in part or whole with voice recognition software. Voice recognition is usually quite accurate but there are transcription errors that can and very often do occur. I apologize for any typographical errors that were not detected and corrected.        Ursula Alert, MD 05/05/2020, 5:42 PM

## 2020-05-08 ENCOUNTER — Other Ambulatory Visit: Payer: Self-pay | Admitting: Student in an Organized Health Care Education/Training Program

## 2020-05-12 ENCOUNTER — Ambulatory Visit: Payer: Medicare Other | Admitting: Licensed Clinical Social Worker

## 2020-05-19 ENCOUNTER — Other Ambulatory Visit: Payer: Self-pay | Admitting: Psychiatry

## 2020-05-19 DIAGNOSIS — F4310 Post-traumatic stress disorder, unspecified: Secondary | ICD-10-CM

## 2020-05-26 ENCOUNTER — Other Ambulatory Visit: Payer: Self-pay | Admitting: Otolaryngology

## 2020-05-26 DIAGNOSIS — E041 Nontoxic single thyroid nodule: Secondary | ICD-10-CM

## 2020-05-31 ENCOUNTER — Other Ambulatory Visit: Payer: Self-pay | Admitting: Otolaryngology

## 2020-05-31 DIAGNOSIS — R1310 Dysphagia, unspecified: Secondary | ICD-10-CM

## 2020-05-31 DIAGNOSIS — K219 Gastro-esophageal reflux disease without esophagitis: Secondary | ICD-10-CM

## 2020-06-03 ENCOUNTER — Other Ambulatory Visit: Payer: Self-pay | Admitting: Psychiatry

## 2020-06-03 DIAGNOSIS — F4310 Post-traumatic stress disorder, unspecified: Secondary | ICD-10-CM

## 2020-06-07 ENCOUNTER — Ambulatory Visit: Payer: Medicare Other | Admitting: Psychiatry

## 2020-06-09 ENCOUNTER — Ambulatory Visit (INDEPENDENT_AMBULATORY_CARE_PROVIDER_SITE_OTHER): Payer: Medicare Other | Admitting: Licensed Clinical Social Worker

## 2020-06-09 ENCOUNTER — Other Ambulatory Visit: Payer: Self-pay

## 2020-06-09 DIAGNOSIS — F4310 Post-traumatic stress disorder, unspecified: Secondary | ICD-10-CM

## 2020-06-09 NOTE — Progress Notes (Signed)
Virtual Visit via Audio Note  I connected with Kelli Logan on 06/09/20 at  4:00 PM EDT by an audio enabled telemedicine application and verified that I am speaking with the correct person using two identifiers.  Location: Patient: home Provider: remote office [place], [place])   I discussed the limitations of evaluation and management by telemedicine and the availability of in person appointments. The patient expressed understanding and agreed to proceed.  I discussed the assessment and treatment plan with the patient. The patient was provided an opportunity to ask questions and all were answered. The patient agreed with the plan and demonstrated an understanding of the instructions.   The patient was advised to call back or seek an in-person evaluation if the symptoms worsen or if the condition fails to improve as anticipated.  I provided 45 minutes of non-face-to-face time during this encounter.   Kelli Logan Kelli Kian Gamarra, LCSW    THERAPIST PROGRESS NOTE  Session Time: 4:15-5p  Participation Level: Active  Behavioral Response: NAAlertAnxious  Type of Therapy: Individual Therapy  Treatment Goals addressed: Anxiety  Interventions: Supportive and Reframing  Summary: Kelli Logan is a 60 y.o. female who presents with improving symptoms related to PTSD.  Pt reports that overall mood is stable and that she is using coping skills to manage situational stressors/anxiety.  Allowed pt to explore and express thoughts and feelings associated with recent life situations and external stressors. Pt is happy that she has some extra money and shared that she wants to purchase jewelry and a Kelli Logan motorcycle. "I've always been a rider".  Helped pt process through the pros and cons of the purchase and ultimately pt is determined this is something that she wants to do.  Pt reports that she is still in chronic pain with her hip and is disappointed that she is still having this  pain. Pt has tried several interventions post-surgery but doesn't feel they are helping.  Discussed dating--discussed individuals that have "ghosted" pt in the past, or others that just prefer to have a long distance, computer relationship and not meet in person. Discussed pts expectations for dating life--pt wants to put on hold for a while.  Reviewed coping skills that have been successful for pt in the past and encouraged her to continue. Pts cats continue to help pt feel supported and give her an outlet for her caregiving personality.  Continued recommendations are as follows: self care behaviors, positive social engagements, focusing on overall work/home/life balance, and focusing on positive physical and emotional wellness. .   Suicidal/Homicidal: No  Therapist Response: Kelli Logan is continuing to identify conflicts from the past and present that form the basis for present anxiety. Kelli Logan is able to describe current and past experiences with specific fears, prominent worries, and anxiety symptoms including impact on functioning and attempts to resolve it. Kelli Logan is good about identifying coping skills that have been helpful in the past and utilizing them. These behaviors are reflective of both personal growth and progress. Treatment to continue as indicated.  Plan: Return again in 4 weeks.  Diagnosis: Axis I: Post Traumatic Stress Disorder    Axis II: No diagnosis    Kelli Bo Kasra Melvin, LCSW 06/09/2020

## 2020-06-11 ENCOUNTER — Other Ambulatory Visit: Payer: Self-pay | Admitting: Psychiatry

## 2020-06-11 DIAGNOSIS — F4010 Social phobia, unspecified: Secondary | ICD-10-CM

## 2020-06-11 DIAGNOSIS — F410 Panic disorder [episodic paroxysmal anxiety] without agoraphobia: Secondary | ICD-10-CM

## 2020-06-11 DIAGNOSIS — F4310 Post-traumatic stress disorder, unspecified: Secondary | ICD-10-CM

## 2020-06-11 DIAGNOSIS — F424 Excoriation (skin-picking) disorder: Secondary | ICD-10-CM

## 2020-06-13 ENCOUNTER — Ambulatory Visit
Admission: RE | Admit: 2020-06-13 | Discharge: 2020-06-13 | Disposition: A | Discharge status: Home / Self Care | Payer: Medicare Other | Source: Ambulatory Visit | Attending: Otolaryngology | Admitting: Otolaryngology

## 2020-06-13 ENCOUNTER — Other Ambulatory Visit: Payer: Self-pay

## 2020-06-13 DIAGNOSIS — R1310 Dysphagia, unspecified: Secondary | ICD-10-CM | POA: Diagnosis present

## 2020-06-13 DIAGNOSIS — K219 Gastro-esophageal reflux disease without esophagitis: Secondary | ICD-10-CM | POA: Diagnosis present
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
## 2020-06-28 ENCOUNTER — Encounter: Payer: Self-pay | Admitting: Psychiatry

## 2020-06-28 ENCOUNTER — Other Ambulatory Visit: Payer: Self-pay

## 2020-06-28 ENCOUNTER — Telehealth (INDEPENDENT_AMBULATORY_CARE_PROVIDER_SITE_OTHER): Payer: Medicare Other | Admitting: Psychiatry

## 2020-06-28 DIAGNOSIS — F424 Excoriation (skin-picking) disorder: Secondary | ICD-10-CM | POA: Diagnosis not present

## 2020-06-28 DIAGNOSIS — F4320 Adjustment disorder, unspecified: Secondary | ICD-10-CM

## 2020-06-28 DIAGNOSIS — F410 Panic disorder [episodic paroxysmal anxiety] without agoraphobia: Secondary | ICD-10-CM

## 2020-06-28 DIAGNOSIS — F4310 Post-traumatic stress disorder, unspecified: Secondary | ICD-10-CM | POA: Diagnosis not present

## 2020-06-28 DIAGNOSIS — F4010 Social phobia, unspecified: Secondary | ICD-10-CM
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
## 2020-07-05 ENCOUNTER — Ambulatory Visit: Payer: Medicare Other

## 2020-07-13 ENCOUNTER — Ambulatory Visit (INDEPENDENT_AMBULATORY_CARE_PROVIDER_SITE_OTHER): Payer: Medicare Other | Admitting: Licensed Clinical Social Worker

## 2020-07-13 ENCOUNTER — Other Ambulatory Visit: Payer: Self-pay

## 2020-07-13 DIAGNOSIS — F4310 Post-traumatic stress disorder, unspecified: Secondary | ICD-10-CM

## 2020-07-13 NOTE — Progress Notes (Signed)
Virtual Visit via Video Note  I connected with Kelli Logan on 07/13/20 at  3:00 PM EDT by a video enabled telemedicine application and verified that I am speaking with the correct person using two identifiers.  Location: Patient: home Provider: [facility]   I discussed the limitations of evaluation and management by telemedicine and the availability of in person appointments. The patient expressed understanding and agreed to proceed.   I discussed the assessment and treatment plan with the patient. The patient was provided an opportunity to ask questions and all were answered. The patient agreed with the plan and demonstrated an understanding of the instructions.   The patient was advised to call back or seek an in-person evaluation if the symptoms worsen or if the condition fails to improve as anticipated.  I provided 45 minutes of non-face-to-face time during this encounter.   Chenee Munns R Adora Yeh, LCSW   THERAPIST PROGRESS NOTE  Session Time: 3-3:45p  Participation Level: Active  Behavioral Response: NAAlertAnxious  Type of Therapy: Individual Therapy  Treatment Goals addressed: Anxiety  Interventions: CBT and Psychosocial Skills: coping with complex medical issues; relationship issues  Summary: Kelli Logan is a 60 y.o. female who presents with Reporting symptoms consistent with PTSD. Patient reports overall mood is stable, and that she is managing anxiety and stress well. Patient reports good quality and quantity of sleep period patient reports that she goes to bed around 11:00 PM each evening and will sleep until 12pm. Patient reports that she often still feels tired after sleeping many consecutive hours.   Discussed overall psychological impact of patients continuing chronic pain from surgery area on hip. Patient feels that this impacts everything that she does, especially recreational activities. Patient did report that she went to the zoo recently and had a good  time.   Allowed patient safe space to explore relationship with son, and reviewed marriages and relationships from the past. Encouraged patient to continue focusing on self care and social engagement..   Suicidal/Homicidal: No  Therapist Response: Kelli Logan is continuing to identify major life conflicts from the past and present that helped form basis for anxiety. Kelli Logan is able to describe overall impact of her anxiety on functioning and ways that she is attempting to manage it. Patient is trying hard to increase her energy, engage in activities, and socialize with others. Patient is doing well with overall self awareness and ability to express feelings of hurt, disappointment, shame, and anger that are associated with early life experiences. These behaviors are reflective of continuing personal growth and progress. Treatment to continue as indicated.  Plan: Return again in 4 weeks.  Diagnosis: Axis I: PTSD    Axis II: No diagnosis    [place], LCSW 07/13/2020

## 2020-08-03 ENCOUNTER — Other Ambulatory Visit: Payer: Self-pay

## 2020-08-03 ENCOUNTER — Ambulatory Visit
Admission: RE | Admit: 2020-08-03 | Discharge: 2020-08-03 | Disposition: A | Discharge status: Home / Self Care | Payer: Medicare Other | Source: Ambulatory Visit | Attending: Otolaryngology | Admitting: Otolaryngology

## 2020-08-03 DIAGNOSIS — E041 Nontoxic single thyroid nodule: Secondary | ICD-10-CM | POA: Diagnosis not present

## 2020-08-23 ENCOUNTER — Ambulatory Visit: Payer: Medicare Other | Admitting: Licensed Clinical Social Worker

## 2020-08-30 ENCOUNTER — Other Ambulatory Visit: Payer: Self-pay

## 2020-08-30 ENCOUNTER — Encounter: Payer: Self-pay | Admitting: Psychiatry

## 2020-08-30 ENCOUNTER — Ambulatory Visit (INDEPENDENT_AMBULATORY_CARE_PROVIDER_SITE_OTHER): Payer: Medicare Other | Admitting: Psychiatry

## 2020-08-30 VITALS — BP 134/87 | HR 73 | Temp 97.8°F | Wt 209.8 lb

## 2020-08-30 DIAGNOSIS — F4310 Post-traumatic stress disorder, unspecified: Secondary | ICD-10-CM | POA: Diagnosis not present

## 2020-08-30 DIAGNOSIS — F330 Major depressive disorder, recurrent, mild: Secondary | ICD-10-CM | POA: Diagnosis not present

## 2020-08-30 DIAGNOSIS — F410 Panic disorder [episodic paroxysmal anxiety] without agoraphobia: Secondary | ICD-10-CM

## 2020-08-30 DIAGNOSIS — F424 Excoriation (skin-picking) disorder: Secondary | ICD-10-CM

## 2020-08-30 DIAGNOSIS — G4730 Sleep apnea, unspecified: Secondary | ICD-10-CM

## 2020-08-30 DIAGNOSIS — F4010 Social phobia, unspecified: Secondary | ICD-10-CM

## 2020-08-30 DIAGNOSIS — G4733 Obstructive sleep apnea (adult) (pediatric): Secondary | ICD-10-CM | POA: Insufficient documentation

## 2020-08-30 DIAGNOSIS — Z79899 Other long term (current) drug therapy: Secondary | ICD-10-CM | POA: Insufficient documentation

## 2020-08-30 MED ORDER — DULOXETINE HCL 30 MG PO CPEP: 30.0000 mg | ORAL_CAPSULE | Freq: Every day | ORAL | 1 refills | Status: DC

## 2020-08-30 MED ORDER — CITALOPRAM HYDROBROMIDE 10 MG PO TABS: 10.0000 mg | ORAL_TABLET | Freq: Every day | ORAL | 0 refills | Status: DC

## 2020-08-30 NOTE — Progress Notes (Signed)
[place] MD OP Progress Note  08/30/2020 8:23 AM Kelli Logan  MRN:  [id number]  Chief Complaint:  Chief Complaint   Follow-up; Depression; Anxiety    HPI: Kelli Logan is a 60 year old Caucasian female, divorced, lives in [place], has a history of skin picking disorder, PTSD, MDD, panic disorder, social anxiety disorder, history of borderline personality disorder, cognitive disorder, breast cancer per history, total hip replacement surgery was evaluated in office today.  Patient today reports she is currently struggling with a lot of pain.  She continues to have pain of her hip joint as well as has lower back pain as well as spasms.  She reports she struggles with neurological pain which is also a struggle for her.  This does limits her physically.  She reports she is worried a lot about her weight gain.  Patient reports a history of sleep apnea.  She reports she had a sleep study done and was advised to start CPAP however she could not afford it.  She has has been noncompliant.  She does not have the sleep study report with her.  She reports she sleeps okay at night however feels tired or fatigued throughout the day.  She also has problems with concentration and memory .  Unknown if sleep problems affecting her concentration as well.  She lives by herself and hence does not know if she does have snoring episodes at night or more.  Patient had difficulty staying on track during her conversations with Probation officer today.  She had to be redirected several times.  Patient did have neuropsychological testing done which ruled out neurocognitive disorders or dementia.  Patient currently reports depressive symptoms like inability to enjoy pleasures, concentration trouble, lack of motivation, low energy, fatigue.  This has been getting worse since the past few weeks.  She also reports anxiety symptoms, anxiety about her current situational stressors, her health.  Denies any skin picking at this  time.  Reports appetite is fair.  She denies any suicidality, homicidality or perceptual disturbances.  Patient denies any other concerns today.  Visit Diagnosis:    ICD-10-CM   1. PTSD (post-traumatic stress disorder)  F43.10 citalopram (CELEXA) 10 MG tablet    DULoxetine (CYMBALTA) 30 MG capsule    2. Skin-picking disorder  F42.4 citalopram (CELEXA) 10 MG tablet    DULoxetine (CYMBALTA) 30 MG capsule    3. MDD (major depressive disorder), recurrent episode, mild (HCC)  F33.0 citalopram (CELEXA) 10 MG tablet    DULoxetine (CYMBALTA) 30 MG capsule    4. Panic disorder  F41.0 citalopram (CELEXA) 10 MG tablet    DULoxetine (CYMBALTA) 30 MG capsule    5. Social anxiety disorder  F40.10 citalopram (CELEXA) 10 MG tablet    DULoxetine (CYMBALTA) 30 MG capsule    6. High risk medication use  Z79.899 Sodium    7. Sleep apnea, unspecified type  G47.30       Past Psychiatric History: Reviewed past psychiatric history from progress note on 03/10/2018.  Past trials of Paxil, Prozac, Zoloft, Lamictal, Depakote, Klonopin, Seroquel  Past Medical History:  Past Medical History:  Diagnosis Date   Anxiety    Arthritis    Cancer ([place])    breast left   Depression    Fibromyalgia    GERD (gastroesophageal reflux disease)    Headache    History of kidney stones    h/o   Hypertension    Hypothyroidism    Sleep apnea    DOES NOT USE  CPAP   Thyroid disease     Past Surgical History:  Procedure Laterality Date   APPENDECTOMY     BREAST BIOPSY Left few yrs ago   stereotactic bx in [place], benign   BREAST BIOPSY Left 12/26/2017   Affirm Bx IMC- X-Clip   BREAST BIOPSY Left 01/14/2018   Affirm Bx- Coil clip- path pending   BREAST RECONSTRUCTION WITH PLACEMENT OF TISSUE EXPANDER AND ALLODERM Left 04/08/2018   Procedure: LEFT BREAST IMMEDIATE  RECONSTRUCTION WITH EXPANDER AND FLEX HD;  Surgeon: Wallace Going, DO;  Location: [facility];  Service: Plastics;  Laterality: Left;   COLONOSCOPY  WITH PROPOFOL N/A 06/26/2018   Procedure: COLONOSCOPY WITH PROPOFOL;  Surgeon: Virgel Manifold, MD;  Location: [facility];  Service: Endoscopy;  Laterality: N/A;   ESOPHAGOGASTRODUODENOSCOPY (EGD) WITH PROPOFOL N/A 06/26/2018   Procedure: ESOPHAGOGASTRODUODENOSCOPY (EGD) WITH PROPOFOL;  Surgeon: Virgel Manifold, MD;  Location: [facility];  Service: Endoscopy;  Laterality: N/A;   EYE SURGERY Bilateral    lasik   FOOT BONE EXCISION     IMAGE GUIDED SINUS SURGERY  10/220   LIPOSUCTION WITH LIPOFILLING Left 04/30/2019   Procedure: Fat grafting to left breast;  Surgeon: Wallace Going, DO;  Location: [facility];  Service: Plastics;  Laterality: Left;  90 min, please   MASTECTOMY Left 04/08/2019   MASTECTOMY W/ SENTINEL NODE BIOPSY Left 04/08/2018   Procedure: MASTECTOMY WITH SENTINEL LYMPH NODE BIOPSY LEFT;  Surgeon: Fredirick Maudlin, MD;  Location: [facility];  Service: General;  Laterality: Left;   MASTOPEXY Right 08/04/2018   Procedure: MASTOPEXY;  Surgeon: Wallace Going, DO;  Location: [facility];  Service: Plastics;  Laterality: Right;  TOTAL CASE TIME SHOULD BE 3 HOURS, PLEASE   REMOVAL OF TISSUE EXPANDER AND PLACEMENT OF IMPLANT Left 08/04/2018   Procedure: REMOVAL OF TISSUE EXPANDER AND PLACEMENT OF IMPLANT;  Surgeon: Wallace Going, DO;  Location: [facility];  Service: Plastics;  Laterality: Left;   SCAR REVISION Left 04/30/2019   Procedure: release of scar contracture to left breast;  Surgeon: Wallace Going, DO;  Location: [facility];  Service: Plastics;  Laterality: Left;   TOTAL HIP ARTHROPLASTY Left 02/11/2020   Procedure: TOTAL HIP ARTHROPLASTY ANTERIOR APPROACH;  Surgeon: Hessie Knows, MD;  Location: [facility];  Service: Orthopedics;  Laterality: Left;   UMBILICAL HERNIA REPAIR  1983 and 1985    Family Psychiatric History: Reviewed family psychiatric history from progress note on 03/10/2018  Family History:  Family History   Problem Relation Age of Onset   Hypertension Mother    Lung cancer Maternal Grandmother    Heart disease Father    Alcohol abuse Father    Breast cancer Neg Hx    Colon cancer Neg Hx     Social History: Reviewed social history from progress note on 03/10/2018 Social History   Socioeconomic History   Marital status: Divorced    Spouse name: Not on file   Number of children: 1   Years of education: Not on file   Highest education level: High school graduate  Occupational History   Not on file  Tobacco Use   Smoking status: Former    Packs/day: 1.00    Years: 2.00    Pack years: 2.00    Types: Cigarettes    Quit date: 01/06/2017    Years since quitting: 3.6   Smokeless tobacco: Never  Vaping Use   Vaping Use: Former   Quit date: 11/27/2016  Substance and Sexual Activity   Alcohol use: Yes    Comment: rare beer    Drug use: Not Currently   Sexual activity: Not on file  Other Topics Concern   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 Quit smoking; ocassional alcohol.    Social Determinants of Health   Financial Resource Strain: High Risk   Difficulty of Paying Living Expenses: Very hard  Food Insecurity: Landscape architect Present   Worried About Charity fundraiser in the Last Year: Often true   Arboriculturist in the Last Year: Often true  Transportation Needs: Not on file  Physical Activity: Not on file  Stress: Stress Concern Present   Feeling of Stress : Very much  Social Connections: Not on file    Allergies:  Allergies  Allergen Reactions   Tape Other (See Comments)    Paper tape after breast surgery/Burned skin   Tegaderm and other tape OK    Metabolic Disorder Labs: No results found for: HGBA1C, MPG No results found for: PROLACTIN No results found for: CHOL, TRIG, HDL, CHOLHDL, VLDL, LDLCALC No results found for:  TSH  Therapeutic Level Labs: No results found for: LITHIUM No results found for: VALPROATE No components found for:  CBMZ  Current Medications: Current Outpatient Medications  Medication Sig Dispense Refill   atorvastatin (LIPITOR) 40 MG tablet Take 40 mg by mouth at bedtime.      azithromycin (ZITHROMAX) 500 MG tablet Take 1,000 mg by mouth once.     Cephalexin 500 MG tablet Take 500 mg by mouth 3 (three) times daily.     citalopram (CELEXA) 10 MG tablet Take 1 tablet (10 mg total) by mouth daily. 7 tablet 0   Coenzyme Q10 (COQ10) 100 MG CAPS Take 100 mg by mouth every evening.     DIFLUCAN 150 MG tablet Take by mouth.     doxazosin (CARDURA) 1 MG tablet Take 1 tablet (1 mg total) by mouth 2 (two) times daily. Needs office visit for further refills. Thank you! 60 tablet 0   DULoxetine (CYMBALTA) 30 MG capsule Take 1 capsule (30 mg total) by mouth daily. 30 capsule 1   gabapentin (NEURONTIN) 300 MG capsule Take 300 mg by mouth every morning.     gabapentin (NEURONTIN) 600 MG tablet Take 1 tablet (600 mg total) by mouth as directed. TAKE HALF TABLET IN THE AM AND HALF TABLET AT LUNCH AND 1 TABLET( 600 MG ) AT BEDTIME (Patient taking differently: Take 600 mg by mouth at bedtime.) 180 tablet 0   GLUCOSAMINE-CHONDROITIN PO Take 1-2 tablets by mouth daily. 1500-1200     hydrochlorothiazide (HYDRODIURIL) 25 MG tablet Take 25 mg by mouth daily.      hydrOXYzine (VISTARIL) 50 MG capsule Take 1 capsule (50 mg total) by mouth daily as needed. For sleep, anxiety (Patient taking differently: Take 50 mg by mouth at bedtime. For sleep, anxiety) 90 capsule 0   ipratropium (ATROVENT) 0.03 % nasal spray Place into both nostrils.     levothyroxine (SYNTHROID) 75 MCG tablet Take 75 mcg by mouth every other day. Alternating with 50 mcg every other day-AM     levothyroxine (SYNTHROID, LEVOTHROID) 50 MCG tablet Take 50 mcg by mouth every other day. Alternating with 75 mcg every other day-AM     losartan (COZAAR)  100 MG tablet Take 100 mg by mouth daily. for high  blood pressure     metoprolol (LOPRESSOR) 50 MG tablet Take 1 tablet (50 mg total) by mouth 2 (two) times daily. 60 tablet 0   mirtazapine (REMERON) 15 MG tablet TAKE 1 TABLET (15 MG TOTAL) BY MOUTH AT BEDTIME. FOR SLEEP 90 tablet 0   Multiple Vitamin (MULTIVITAMIN WITH MINERALS) TABS tablet Take 1 tablet by mouth daily.     Omega-3 Fatty Acids (FISH OIL PO) Take 1 capsule by mouth daily.     omeprazole (PRILOSEC) 20 MG capsule Take 20 mg by mouth every morning.     ondansetron (ZOFRAN ODT) 4 MG disintegrating tablet Take 1 tablet (4 mg total) by mouth every 8 (eight) hours as needed for nausea or vomiting. 20 tablet 0   enoxaparin (LOVENOX) 40 MG/0.4ML injection Inject 0.4 mLs (40 mg total) into the skin daily for 12 days. 4.8 mL 0   No current facility-administered medications for this visit.     Musculoskeletal: Strength & Muscle Tone: within normal limits Gait & Station: normal Patient leans: N/A  Psychiatric Specialty Exam: Review of Systems  Constitutional:  Positive for fatigue.  Musculoskeletal:  Positive for back pain.  Psychiatric/Behavioral:  Positive for decreased concentration, dysphoric mood and sleep disturbance. The patient is nervous/anxious.   All other systems reviewed and are negative.  Blood pressure 134/87, pulse 73, temperature 97.8 F (36.6 C), temperature source Temporal, weight 209 lb 12.8 oz (95.2 kg), last menstrual period 02/05/2016.Body mass index is 33.86 kg/m.  General Appearance: Casual  Eye Contact:  Good  Speech:  Clear and Coherent  Volume:  Normal  Mood:  Anxious and Depressed  Affect:  Congruent  Thought Process:  Linear and Descriptions of Associations: Circumstantial  Orientation:  Full (Time, Place, and Person)  Thought Content: Logical   Suicidal Thoughts:  No  Homicidal Thoughts:  No  Memory:  Immediate;   Fair Recent;   Fair Remote;   limited  Judgement:  Fair  Insight:  Fair   Psychomotor Activity:  Normal  Concentration:  Concentration: Fair and Attention Span: Fair  Recall:  [employer] of Knowledge: Fair  Language: Fair  Akathisia:  No  Handed:  Right  AIMS (if indicated): done  Assets:  Desire for Improvement Housing Transportation Vocational/Educational  ADL's:  Intact  Cognition: WNL  Sleep:   ok, however reports hx of sleep apnea - noncompliant with CPAP   Screenings: GAD-7    Flowsheet Row Office Visit from 08/30/2020 in [facility]  Total GAD-7 Score 9      PHQ2-9    [place] Visit from 08/30/2020 in [facility] Video Visit from 06/28/2020 in [facility] Counselor from 03/24/2020 in [facility]  PHQ-2 Total Score '1 3 1  '$ PHQ-9 Total Score 11 10 --      Flowsheet Row Counselor from 07/13/2020 in [facility] Video Visit from 06/28/2020 in [facility] Counselor from 03/24/2020 in [facility] No Risk        Assessment and Plan: Jatana Shunk is a 60 year old Caucasian female, divorced, lives in [place], has a history of PTSD, skin picking disorder, panic disorder, social anxiety disorder, multiple other medical problems was evaluated in office today.  Patient is currently struggling with depression, anxiety as well as pain and will benefit from the following plan.  Plan PTSD-stable Continue mirtazapine 15 mg p.o. nightly Continue CBT-patient to  establish care with a new therapist.  Skin picking disorder-stable Continue CBT  MDD-unstable Taper of Celexa.  Advised patient to start taking Celexa 10 mg p.o. daily for the next 1 week and stop taking it. Start Cymbalta 30 mg p.o. daily.  Provided medication education including adverse side effects, drug to drug interaction with her  antidepressants like Celexa including serotonin syndrome.  She will monitor herself closely.  Panic disorder-unstable Start Cymbalta 30 mg p.o. daily Restart CBT, patient to be established care with a new therapist.   Social anxiety disorder-improving Mirtazapine as prescribed Hydroxyzine 50 mg p.o. twice daily as needed Patient to continue CBT, reestablish care with a therapist   High risk medication use-will order sodium level.  Provided lab slip.   Sleep apnea per history-patient reports she had sleep study done previously and was diagnosed with severe sleep apnea.  She however was noncompliant with CPAP since she could not afford it.  Patient will benefit from sleep clinic evaluation and management.  We will refer for the same.  Follow-up in clinic in 2 to 3 weeks in office.  This note was generated in part or whole with voice recognition software. Voice recognition is usually quite accurate but there are transcription errors that can and very often do occur. I apologize for any typographical errors that were not detected and corrected.     Ursula Alert, MD 08/31/2020, 8:23 AM

## 2020-08-30 NOTE — Patient Instructions (Signed)
Duloxetine Delayed-Release Capsules What is this medication? DULOXETINE (doo LOX e teen) treats depression, anxiety, fibromyalgia, and certain types of chronic pain such as nerve, bone, or joint pain. It increases the amount of serotonin and norepinephrine in the brain, hormones that helpregulate mood and pain. It belongs to a group of medications called SNRIs. This medicine may be used for other purposes; ask your health care provider orpharmacist if you have questions. COMMON BRAND NAME(S): Cymbalta, Drizalma, Irenka What should I tell my care team before I take this medication? They need to know if you have any of these conditions: Bipolar disorder Glaucoma High blood pressure Kidney disease Liver disease Seizures Suicidal thoughts, plans or attempt; a previous suicide attempt by you or a family member Take medications that treat or prevent blood clots Taken medications called MAOIs like Carbex, Eldepryl, Marplan, Nardil, and Parnate within 14 days Trouble passing urine An unusual reaction to duloxetine, other medications, foods, dyes, or preservatives Pregnant or trying to get pregnant Breast-feeding How should I use this medication? Take this medication by mouth with a glass of water. Follow the directions on the prescription label. Do not crush, cut or chew some capsules of this medication. Some capsules may be opened and sprinkled on applesauce. Check with your care team or pharmacist if you are not sure. You can take this medication with or without food. Take your medication at regular intervals. Do not take your medication more often than directed. Do not stop taking this medication suddenly except upon the advice of your care team. Stopping this medication tooquickly may cause serious side effects or your condition may worsen. A special MedGuide will be given to you by the pharmacist with eachprescription and refill. Be sure to read this information carefully each time. Talk to your  care team regarding the use of this medication in children. While this medication may be prescribed for children as young as 92 years of age forselected conditions, precautions do apply. Overdosage: If you think you have taken too much of this medicine contact apoison control center or emergency room at once. NOTE: This medicine is only for you. Do not share this medicine with others. What if I miss a dose? If you miss a dose, take it as soon as you can. If it is almost time for yournext dose, take only that dose. Do not take double or extra doses. What may interact with this medication? Do not take this medication with any of the following: Desvenlafaxine Levomilnacipran Linezolid MAOIs like Carbex, Eldepryl, Emsam, Marplan, Nardil, and Parnate Methylene blue (injected into a vein) Milnacipran Safinamide Thioridazine Venlafaxine Viloxazine This medication may also interact with the following: Alcohol Amphetamines Aspirin and aspirin-like medications Certain antibiotics like ciprofloxacin and enoxacin Certain medications for blood pressure, heart disease, irregular heart beat Certain medications for depression, anxiety, or psychotic disturbances Certain medications for migraine headache like almotriptan, eletriptan, frovatriptan, naratriptan, rizatriptan, sumatriptan, zolmitriptan Certain medications that treat or prevent blood clots like warfarin, enoxaparin, and dalteparin Cimetidine Fentanyl Lithium NSAIDS, medications for pain and inflammation, like ibuprofen or naproxen Phentermine Procarbazine Rasagiline Sibutramine St. John's wort Theophylline Tramadol Tryptophan This list may not describe all possible interactions. Give your health care provider a list of all the medicines, herbs, non-prescription drugs, or dietary supplements you use. Also tell them if you smoke, drink alcohol, or use illegaldrugs. Some items may interact with your medicine. What should I watch for  while using this medication? Tell your care team if your symptoms do not get  better or if they get worse. Visit your care team for regular checks on your progress. Because it may take several weeks to see the full effects of this medication, it is important tocontinue your treatment as prescribed by your care team. This medication may cause serious skin reactions. They can happen weeks to months after starting the medication. Contact your care team right away if you notice fevers or flu-like symptoms with a rash. The rash may be red or purple and then turn into blisters or peeling of the skin. Or, you might notice a red rash with swelling of the face, lips, or lymph nodes in your neck or under yourarms. Watch for new or worsening thoughts of suicide or depression. This includes sudden changes in mood, behaviors, or thoughts. These changes can happen at any time but are more common in the beginning of treatment or after a change in dose. Call your care team right away if you experience these thoughts orworsening depression. Manic episodes may happen in patients with bipolar disorder who take this medication. Watch for changes in feelings or behaviors such as feeling anxious, nervous, agitated, panicky, irritable, hostile, aggressive, impulsive, severely restless, overly excited and hyperactive, or trouble sleeping. These symptoms can happen at any time, but are more common in the beginning of treatment or after a change in dose. Call your care team right away if you notice any ofthese symptoms. You may get drowsy or dizzy. Do not drive, use machinery, or do anything that needs mental alertness until you know how this medication affects you. Do not stand or sit up quickly, especially if you are an older patient. This reduces the risk of dizzy or fainting spells. Alcohol may interfere with the effect ofthis medication. Avoid alcoholic drinks. This medication may increase blood sugar. The risk may be higher in  patients who already have diabetes. Ask your care team what you can do to lower yourrisk of diabetes while taking this medication. This medication can cause an increase in blood pressure. This medication can also cause a sudden drop in your blood pressure, which may make you feel faint and increase the chance of a fall. These effects are most common when you first start the medication or when the dose is increased, or during use of other medications that can cause a sudden drop in blood pressure. Check with your care team for instructions on monitoring your blood pressure while taking thismedication. Your mouth may get dry. Chewing sugarless gum or sucking hard candy, and drinking plenty of water, may help. Contact your care team if the problem doesnot go away or is severe. What side effects may I notice from receiving this medication? Side effects that you should report to your care team as soon as possible: Allergic reactions-skin rash, itching, hives, swelling of the face, lips, tongue, or throat Bleeding-bloody or black, tar-like stools, red or dark brown urine, vomiting blood or brown material that looks like coffee grounds, small, red or purple spots on skin, unusual bleeding or bruising Increase in blood pressure Liver injury-right upper belly pain, loss of appetite, nausea, light-colored stool, dark yellow or brown urine, yellowing skin or eyes, unusual weakness or fatigue Low sodium level-muscle weakness, fatigue, dizziness, headache, confusion Redness, blistering, peeling, or loosening of the skin, including inside the mouth Serotonin syndrome-irritability, confusion, fast or irregular heartbeat, muscle stiffness, twitching muscles, sweating, high fever, seizures, chills, vomiting, diarrhea Sudden eye pain or change in vision such as blurry vision, seeing halos around lights, vision  loss Thoughts of suicide or self-harm, worsening mood, feelings of depression Trouble passing urine Side  effects that usually do not require medical attention (report to your careteam if they continue or are bothersome): Change in sex drive or performance Constipation Diarrhea Dizziness Dry mouth Excessive sweating Loss of appetite Nausea Vomiting This list may not describe all possible side effects. Call your doctor for medical advice about side effects. You may report side effects to FDA at1-800-FDA-1088. Where should I keep my medication? Keep out of the reach of children and pets. Store at room temperature between 15 and 30 degrees C (59 to 86 degrees F). Getrid of any unused medication after the expiration date. To get rid of medications that are no longer needed or have expired: Take the medication to a medication take-back program. Check with your pharmacy or law enforcement to find a location. If you cannot return the medication, check the label or package insert to see if the medication should be thrown out in the garbage or flushed down the toilet. If you are not sure, ask your care team. If it is safe to put it in the trash, take the medication out of the container. Mix the medication with cat litter, dirt, coffee grounds, or other unwanted substance. Seal the mixture in a bag or container. Put it in the trash. NOTE: This sheet is a summary. It may not cover all possible information. If you have questions about this medicine, talk to your doctor, pharmacist, orhealth care provider.  2022 [employer] (2020-01-04 12:32:25)

## 2020-09-13 ENCOUNTER — Other Ambulatory Visit: Payer: Self-pay

## 2020-09-13 ENCOUNTER — Ambulatory Visit (INDEPENDENT_AMBULATORY_CARE_PROVIDER_SITE_OTHER): Payer: Medicare Other | Admitting: Licensed Clinical Social Worker

## 2020-09-13 DIAGNOSIS — F4310 Post-traumatic stress disorder, unspecified: Secondary | ICD-10-CM | POA: Diagnosis not present

## 2020-09-13 NOTE — Progress Notes (Signed)
Virtual Visit via Video Note  I connected with Kelli Logan on 09/13/20 at  4:00 PM EDT by a video enabled telemedicine application and verified that I am speaking with the correct person using two identifiers.  Location: Patient: home Provider: remote office [place], [place])   I discussed the limitations of evaluation and management by telemedicine and the availability of in person appointments. The patient expressed understanding and agreed to proceed.   I discussed the assessment and treatment plan with the patient. The patient was provided an opportunity to ask questions and all were answered. The patient agreed with the plan and demonstrated an understanding of the instructions.   The patient was advised to call back or seek an in-person evaluation if the symptoms worsen or if the condition fails to improve as anticipated.  I provided 55 minutes of non-face-to-face time during this encounter.   Alda Gaultney R [place] Pelto, LCSW   THERAPIST PROGRESS NOTE  Session Time: 4-4:55p  Participation Level: Active  Behavioral Response: NAAlertAnxious and Depressed  Type of Therapy: Individual Therapy  Treatment Goals addressed: Anxiety, Coping, and Diagnosis: PTSD  Interventions: CBT, DBT, and Other: trauma focused  Summary: Kelli Logan is a 60 y.o. female who presents with improving symptoms related to PTSD diagnosis. Patient reports that overall mood has been stable and that she is managing situational stressors well. Patient reports fair quality and quantity of sleep. Allowed patient safe space to explore and express thoughts and feelings associated with recent external stressors and life events. Explored patient current relationships with individuals that she has met online. Reviewed patients perspective of pros and cons associated with relationships. Patient states that she will often take the lead as far as activities on dates. Discussed goals that patient has about clearing  off items in rooms that do not need to be there. Patient reports that she spends a lot of time in her bedroom because that is set up as her office. Patient reports that she spends most of her time when she is in her home in that room. Allow patient to brainstorm through some goals that she would like to set about feeling more comfortable in her space. Patient reports that she recently purchased a new scooter, and is happy to have the means of transportation. Patient was discussing previous jobs that she has had in the past--allow patient to explore and express jobs that she feels like more reflective of her skills and personality and others that were not. Patient reports that she still has some pain in her body and has upcoming appointments with chiropractor. Patient also reports that she has been diagnosed with obstructive sleep apnea, and has appointment to discuss this with her primary care physician soon.Continued recommendations are as follows: self care behaviors, positive social engagements, focusing on overall work/home/life balance, and focusing on positive physical and emotional wellness.  .   Suicidal/Homicidal: No  Therapist Response: Kelli Logan is continuing to identify major life conflicts from the past and present that helped form basis for anxiety. Jasalyn is able to describe overall impact of her anxiety on functioning and ways that she is attempting to manage it. Patient is trying hard to increase her energy, engage in activities, and socialize with others. Patient is doing well with overall self awareness and ability to express feelings of hurt, disappointment, shame, and anger that are associated with early life experiences. These behaviors are reflective of continuing personal growth and progress. Treatment to continue as indicated.  Plan: Return again in 4  weeks.  Diagnosis: Axis I: Post Traumatic Stress Disorder    Axis II: No diagnosis    Berger, LCSW 09/13/2020

## 2020-09-15 ENCOUNTER — Telehealth (INDEPENDENT_AMBULATORY_CARE_PROVIDER_SITE_OTHER): Payer: Medicare Other | Admitting: Psychiatry

## 2020-09-15 ENCOUNTER — Telehealth: Payer: Medicare Other

## 2020-09-15 ENCOUNTER — Encounter: Payer: Self-pay | Admitting: Psychiatry

## 2020-09-15 ENCOUNTER — Other Ambulatory Visit: Payer: Self-pay

## 2020-09-15 DIAGNOSIS — F410 Panic disorder [episodic paroxysmal anxiety] without agoraphobia: Secondary | ICD-10-CM | POA: Diagnosis not present

## 2020-09-15 DIAGNOSIS — F4310 Post-traumatic stress disorder, unspecified: Secondary | ICD-10-CM

## 2020-09-15 DIAGNOSIS — F4010 Social phobia, unspecified: Secondary | ICD-10-CM

## 2020-09-15 DIAGNOSIS — F330 Major depressive disorder, recurrent, mild: Secondary | ICD-10-CM

## 2020-09-15 DIAGNOSIS — Z79899 Other long term (current) drug therapy: Secondary | ICD-10-CM

## 2020-09-15 DIAGNOSIS — F424 Excoriation (skin-picking) disorder: Secondary | ICD-10-CM

## 2020-09-15 DIAGNOSIS — G4730 Sleep apnea, unspecified: Secondary | ICD-10-CM

## 2020-09-15 MED ORDER — HYDROXYZINE PAMOATE 50 MG PO CAPS
50.0000 mg | ORAL_CAPSULE | Freq: Every day | ORAL | 0 refills | Status: DC | PRN
Start: 2020-09-15 — End: 2021-01-24

## 2020-09-15 MED ORDER — DULOXETINE HCL 30 MG PO CPEP: 30.0000 mg | ORAL_CAPSULE | Freq: Two times a day (BID) | ORAL | 1 refills | Status: DC

## 2020-09-15 MED ORDER — MIRTAZAPINE 15 MG PO TABS
15.0000 mg | ORAL_TABLET | Freq: Every day | ORAL | 0 refills | Status: DC
Start: 2020-09-15 — End: 2021-01-09

## 2020-09-15 NOTE — Telephone Encounter (Signed)
received fax status check that patient has appt set up for 09-21-20 for sleep study.

## 2020-09-15 NOTE — Progress Notes (Signed)
Virtual Visit via Video Note  I connected with Kelli Logan on 09/15/20 at  3:00 PM EDT by a video enabled telemedicine application and verified that I am speaking with the correct person using two identifiers.  Location Provider Location : [facility] Patient Location : Home  Participants: Patient , Provider   I discussed the limitations of evaluation and management by telemedicine and the availability of in person appointments. The patient expressed understanding and agreed to proceed.    I discussed the assessment and treatment plan with the patient. The patient was provided an opportunity to ask questions and all were answered. The patient agreed with the plan and demonstrated an understanding of the instructions.   The patient was advised to call back or seek an in-person evaluation if the symptoms worsen or if the condition fails to improve as anticipated.    [facility] MD OP Progress Note  09/15/2020 3:26 PM Jaylianna Picklesimer  MRN:  [id number]  Chief Complaint:  Chief Complaint   Follow-up; Anxiety; Depression    HPI: Kelli Logan is a 60 year old Caucasian female, divorced, lives in [place], has a history of skin picking disorder, PTSD, MDD, panic disorder, social anxiety disorder, history of borderline personality disorder, cognitive disorder, breast cancer per history, total hip replacement surgery was evaluated by telemedicine today.  Patient today reports she is tolerating the Cymbalta well.  She was able to taper off of the Celexa.  Denied any withdrawal symptoms.  Patient reports she continues to have anxiety mostly triggered by situations.  She does continue to have sadness, low motivation, concentration problem, anhedonia, fatigue.  She continues to struggle with pain, neurological pain and possibly has pain due to fibromyalgia as well.  Patient reports she does have a history of sleep apnea, currently not on CPAP.  She reports she had nasal surgery done as well as a  mass removed from her sinuses after the sleep study.  She has is going to have a repeat study soon.  Patient reports she sleeps from 2 AM to 2 PM the next day.  This is how she has been sleeping for a long time.  Patient denies any skin picking.  Patient denies any suicidality, homicidality or perceptual disturbances.  Patient denies any other concerns today.    Visit Diagnosis:    ICD-10-CM   1. PTSD (post-traumatic stress disorder)  F43.10 DULoxetine (CYMBALTA) 30 MG capsule    mirtazapine (REMERON) 15 MG tablet    hydrOXYzine (VISTARIL) 50 MG capsule    2. Skin-picking disorder  F42.4 DULoxetine (CYMBALTA) 30 MG capsule    3. MDD (major depressive disorder), recurrent episode, mild (HCC)  F33.0 DULoxetine (CYMBALTA) 30 MG capsule    4. Panic disorder  F41.0 DULoxetine (CYMBALTA) 30 MG capsule    5. Social anxiety disorder  F40.10 DULoxetine (CYMBALTA) 30 MG capsule    6. Sleep apnea, unspecified type  G47.30     7. High risk medication use  Z79.899       Past Psychiatric History: Reviewed past psychiatric history from progress note on 03/10/2018.  Past trials of Paxil, Prozac, Zoloft, Lamictal, Depakote, Klonopin, Seroquel  Past Medical History:  Past Medical History:  Diagnosis Date   Anxiety    Arthritis    Cancer ([place])    breast left   Depression    Fibromyalgia    GERD (gastroesophageal reflux disease)    Headache    History of kidney stones    h/o   Hypertension  Hypothyroidism    Sleep apnea    DOES NOT USE CPAP   Thyroid disease     Past Surgical History:  Procedure Laterality Date   APPENDECTOMY     BREAST BIOPSY Left few yrs ago   stereotactic bx in [place], benign   BREAST BIOPSY Left 12/26/2017   Affirm Bx IMC- X-Clip   BREAST BIOPSY Left 01/14/2018   Affirm Bx- Coil clip- path pending   BREAST RECONSTRUCTION WITH PLACEMENT OF TISSUE EXPANDER AND ALLODERM Left 04/08/2018   Procedure: LEFT BREAST IMMEDIATE  RECONSTRUCTION WITH EXPANDER AND FLEX HD;   Surgeon: Wallace Going, DO;  Location: [facility];  Service: Plastics;  Laterality: Left;   COLONOSCOPY WITH PROPOFOL N/A 06/26/2018   Procedure: COLONOSCOPY WITH PROPOFOL;  Surgeon: Virgel Manifold, MD;  Location: [facility];  Service: Endoscopy;  Laterality: N/A;   ESOPHAGOGASTRODUODENOSCOPY (EGD) WITH PROPOFOL N/A 06/26/2018   Procedure: ESOPHAGOGASTRODUODENOSCOPY (EGD) WITH PROPOFOL;  Surgeon: Virgel Manifold, MD;  Location: [facility];  Service: Endoscopy;  Laterality: N/A;   EYE SURGERY Bilateral    lasik   FOOT BONE EXCISION     IMAGE GUIDED SINUS SURGERY  10/220   LIPOSUCTION WITH LIPOFILLING Left 04/30/2019   Procedure: Fat grafting to left breast;  Surgeon: Wallace Going, DO;  Location: [facility];  Service: Plastics;  Laterality: Left;  90 min, please   MASTECTOMY Left 04/08/2019   MASTECTOMY W/ SENTINEL NODE BIOPSY Left 04/08/2018   Procedure: MASTECTOMY WITH SENTINEL LYMPH NODE BIOPSY LEFT;  Surgeon: Fredirick Maudlin, MD;  Location: [facility];  Service: General;  Laterality: Left;   MASTOPEXY Right 08/04/2018   Procedure: MASTOPEXY;  Surgeon: Wallace Going, DO;  Location: [facility];  Service: Plastics;  Laterality: Right;  TOTAL CASE TIME SHOULD BE 3 HOURS, PLEASE   REMOVAL OF TISSUE EXPANDER AND PLACEMENT OF IMPLANT Left 08/04/2018   Procedure: REMOVAL OF TISSUE EXPANDER AND PLACEMENT OF IMPLANT;  Surgeon: Wallace Going, DO;  Location: [facility];  Service: Plastics;  Laterality: Left;   SCAR REVISION Left 04/30/2019   Procedure: release of scar contracture to left breast;  Surgeon: Wallace Going, DO;  Location: [facility];  Service: Plastics;  Laterality: Left;   TOTAL HIP ARTHROPLASTY Left 02/11/2020   Procedure: TOTAL HIP ARTHROPLASTY ANTERIOR APPROACH;  Surgeon: Hessie Knows, MD;  Location: [facility];  Service: Orthopedics;  Laterality: Left;   UMBILICAL HERNIA REPAIR  1983 and 1985    Family Psychiatric  History: Reviewed family psychiatric history from progress note on 03/10/2018  Family History:  Family History  Problem Relation Age of Onset   Hypertension Mother    Lung cancer Maternal Grandmother    Heart disease Father    Alcohol abuse Father    Breast cancer Neg Hx    Colon cancer Neg Hx     Social History: Reviewed social history from progress note on 03/10/2018 Social History   Socioeconomic History   Marital status: Divorced    Spouse name: Not on file   Number of children: 1   Years of education: Not on file   Highest education level: High school graduate  Occupational History   Not on file  Tobacco Use   Smoking status: Former    Packs/day: 1.00    Years: 2.00    Pack years: 2.00    Types: Cigarettes    Quit date: 01/06/2017    Years since quitting: 3.6   Smokeless tobacco: Never  Vaping  Use   Vaping Use: Former   Quit date: 11/27/2016  Substance and Sexual Activity   Alcohol use: Yes    Comment: rare beer    Drug use: Not Currently   Sexual activity: Not on file  Other Topics Concern   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 Quit smoking; ocassional alcohol.    Social Determinants of Health   Financial Resource Strain: High Risk   Difficulty of Paying Living Expenses: Very hard  Food Insecurity: Landscape architect Present   Worried About Charity fundraiser in the Last Year: Often true   Arboriculturist in the Last Year: Often true  Transportation Needs: Not on file  Physical Activity: Not on file  Stress: Stress Concern Present   Feeling of Stress : Very much  Social Connections: Not on file    Allergies:  Allergies  Allergen Reactions   Tape Other (See Comments)    Paper tape after breast surgery/Burned skin   Tegaderm and other tape OK    Metabolic Disorder Labs: No results found for: HGBA1C, MPG No results found for:  PROLACTIN No results found for: CHOL, TRIG, HDL, CHOLHDL, VLDL, LDLCALC No results found for: TSH  Therapeutic Level Labs: No results found for: LITHIUM No results found for: VALPROATE No components found for:  CBMZ  Current Medications: Current Outpatient Medications  Medication Sig Dispense Refill   atorvastatin (LIPITOR) 40 MG tablet Take 40 mg by mouth at bedtime.      azithromycin (ZITHROMAX) 500 MG tablet Take 1,000 mg by mouth once.     Cephalexin 500 MG tablet Take 500 mg by mouth 3 (three) times daily.     citalopram (CELEXA) 10 MG tablet Take 1 tablet (10 mg total) by mouth daily. 7 tablet 0   Coenzyme Q10 (COQ10) 100 MG CAPS Take 100 mg by mouth every evening.     DIFLUCAN 150 MG tablet Take by mouth.     doxazosin (CARDURA) 1 MG tablet Take 1 tablet (1 mg total) by mouth 2 (two) times daily. Needs office visit for further refills. Thank you! 60 tablet 0   DULoxetine (CYMBALTA) 30 MG capsule Take 1 capsule (30 mg total) by mouth 2 (two) times daily. 30 capsule 1   enoxaparin (LOVENOX) 40 MG/0.4ML injection Inject 0.4 mLs (40 mg total) into the skin daily for 12 days. 4.8 mL 0   gabapentin (NEURONTIN) 300 MG capsule Take 300 mg by mouth every morning.     gabapentin (NEURONTIN) 600 MG tablet Take 1 tablet (600 mg total) by mouth as directed. TAKE HALF TABLET IN THE AM AND HALF TABLET AT LUNCH AND 1 TABLET( 600 MG ) AT BEDTIME (Patient taking differently: Take 600 mg by mouth at bedtime.) 180 tablet 0   GLUCOSAMINE-CHONDROITIN PO Take 1-2 tablets by mouth daily. 1500-1200     hydrochlorothiazide (HYDRODIURIL) 25 MG tablet Take 25 mg by mouth daily.      hydrOXYzine (VISTARIL) 50 MG capsule Take 1 capsule (50 mg total) by mouth daily as needed. For sleep, anxiety 90 capsule 0   ipratropium (ATROVENT) 0.03 % nasal spray Place into both nostrils.     levothyroxine (SYNTHROID) 75 MCG tablet Take 75 mcg by mouth every other day. Alternating with 50 mcg every other day-AM      levothyroxine (SYNTHROID, LEVOTHROID) 50 MCG tablet Take 50 mcg by mouth every other  day. Alternating with 75 mcg every other day-AM     losartan (COZAAR) 100 MG tablet Take 100 mg by mouth daily. for high blood pressure     metoprolol (LOPRESSOR) 50 MG tablet Take 1 tablet (50 mg total) by mouth 2 (two) times daily. 60 tablet 0   mirtazapine (REMERON) 15 MG tablet Take 1 tablet (15 mg total) by mouth at bedtime. FOR SLEEP 90 tablet 0   Multiple Vitamin (MULTIVITAMIN WITH MINERALS) TABS tablet Take 1 tablet by mouth daily.     Omega-3 Fatty Acids (FISH OIL PO) Take 1 capsule by mouth daily.     omeprazole (PRILOSEC) 20 MG capsule Take 20 mg by mouth every morning.     ondansetron (ZOFRAN ODT) 4 MG disintegrating tablet Take 1 tablet (4 mg total) by mouth every 8 (eight) hours as needed for nausea or vomiting. 20 tablet 0   No current facility-administered medications for this visit.     Musculoskeletal: Strength & Muscle Tone:  UTA Gait & Station:  Seated Patient leans: Backward  Psychiatric Specialty Exam: Review of Systems  Constitutional:  Positive for fatigue.  Musculoskeletal:  Positive for myalgias.  Psychiatric/Behavioral:  Positive for dysphoric mood. The patient is nervous/anxious.   All other systems reviewed and are negative.  Last menstrual period 02/05/2016.There is no height or weight on file to calculate BMI.  General Appearance: Casual  Eye Contact:  Good  Speech:  Clear and Coherent  Volume:  Normal  Mood:  Anxious and Depressed  Affect:  Congruent  Thought Process:  Goal Directed and Descriptions of Associations: Intact  Orientation:  Full (Time, Place, and Person)  Thought Content: Logical   Suicidal Thoughts:  No  Homicidal Thoughts:  No  Memory:  Immediate;   Fair Recent;   Fair Remote;   Fair  Judgement:  Fair  Insight:  Fair  Psychomotor Activity:  Normal  Concentration:  Concentration: Fair and Attention Span: Fair  Recall:  [employer] of Knowledge:  Fair  Language: Fair  Akathisia:  No  Handed:  Right  AIMS (if indicated): done  Assets:  Communication Skills Desire for Improvement Housing Transportation  ADL's:  Intact  Cognition: WNL  Sleep:  Fair   Screenings: GAD-7    Flowsheet Row Office Visit from 08/30/2020 in [facility]  Total GAD-7 Score 9      PHQ2-9    Flowsheet Row Counselor from 09/13/2020 in [facility] from 08/30/2020 in [facility] Video Visit from 06/28/2020 in [facility] Counselor from 03/24/2020 in [place] [facility] [place]  PHQ-2 Total Score 0 '1 3 1  '$ PHQ-9 Total Score -- 11 10 --      Flowsheet Row Counselor from 09/13/2020 in [facility] Counselor from 07/13/2020 in [facility] Video Visit from 06/28/2020 in [facility] No Risk Low Risk Low Risk        Assessment and Plan: Kelli Logan is a 60 year old Caucasian female, divorced, lives in [place], has a history of PTSD, skin picking disorder, panic disorder, social anxiety disorder, multiple other medical problems was evaluated by telemedicine today.  Patient is currently tapered off of the Celexa, tolerating the Cymbalta, will benefit from continued medication readjustment due to depression and anxiety symptoms.  Plan PTSD-stable Mirtazapine 15 mg p.o. nightly Continue CBT  Skin picking disorder-stable Continue CBT  MDD-unstable Increase Cymbalta to 30 mg p.o. twice  daily  Panic disorder-unstable Increase Cymbalta to 30 mg p.o. twice daily Continue CBT  Social anxiety disorder-improving Mirtazapine as prescribed Hydroxyzine 50 mg p.o. twice daily as needed Increase Cymbalta as noted above Continue CBT  High risk medication use-patient is noncompliant with labs.  Pending sodium  level.  Discussed with patient to sign a release to obtain medical records, sleep study results.  We will coordinate care with her therapist.  Follow-up in clinic in 4 weeks or sooner if needed.  This note was generated in part or whole with voice recognition software. Voice recognition is usually quite accurate but there are transcription errors that can and very often do occur. I apologize for any typographical errors that were not detected and corrected.      Ursula Alert, MD 09/16/2020, 12:34 PM

## 2020-09-15 NOTE — Telephone Encounter (Signed)
faxed a status check on the referral for sleep study.

## 2020-09-15 NOTE — Plan of Care (Signed)
Developed

## 2020-09-17 ENCOUNTER — Other Ambulatory Visit: Payer: Self-pay | Admitting: Psychiatry

## 2020-09-17 DIAGNOSIS — F4010 Social phobia, unspecified: Secondary | ICD-10-CM

## 2020-09-17 DIAGNOSIS — F424 Excoriation (skin-picking) disorder: Secondary | ICD-10-CM

## 2020-09-17 DIAGNOSIS — F4310 Post-traumatic stress disorder, unspecified: Secondary | ICD-10-CM

## 2020-09-17 DIAGNOSIS — F410 Panic disorder [episodic paroxysmal anxiety] without agoraphobia: Secondary | ICD-10-CM

## 2020-09-21 ENCOUNTER — Ambulatory Visit: Payer: Medicare Other | Attending: Neurology

## 2020-09-21 DIAGNOSIS — G4733 Obstructive sleep apnea (adult) (pediatric): Secondary | ICD-10-CM | POA: Diagnosis not present

## 2020-09-22 ENCOUNTER — Other Ambulatory Visit: Payer: Self-pay

## 2020-09-23 ENCOUNTER — Other Ambulatory Visit: Payer: Self-pay | Admitting: Psychiatry

## 2020-09-23 DIAGNOSIS — F4010 Social phobia, unspecified: Secondary | ICD-10-CM

## 2020-09-23 DIAGNOSIS — F410 Panic disorder [episodic paroxysmal anxiety] without agoraphobia: Secondary | ICD-10-CM

## 2020-09-23 DIAGNOSIS — F4310 Post-traumatic stress disorder, unspecified: Secondary | ICD-10-CM

## 2020-09-23 DIAGNOSIS — F330 Major depressive disorder, recurrent, mild: Secondary | ICD-10-CM

## 2020-09-23 DIAGNOSIS — F424 Excoriation (skin-picking) disorder: Secondary | ICD-10-CM

## 2020-10-04 ENCOUNTER — Other Ambulatory Visit: Payer: Self-pay | Admitting: Physician Assistant

## 2020-10-04 DIAGNOSIS — Z1231 Encounter for screening mammogram for malignant neoplasm of breast: Secondary | ICD-10-CM

## 2020-10-06 NOTE — Telephone Encounter (Signed)
CLOSED

## 2020-10-11 ENCOUNTER — Telehealth (INDEPENDENT_AMBULATORY_CARE_PROVIDER_SITE_OTHER): Payer: Medicare Other | Admitting: Psychiatry

## 2020-10-11 ENCOUNTER — Other Ambulatory Visit: Payer: Self-pay

## 2020-10-11 ENCOUNTER — Encounter: Payer: Self-pay | Admitting: Psychiatry

## 2020-10-11 DIAGNOSIS — F4010 Social phobia, unspecified: Secondary | ICD-10-CM

## 2020-10-11 DIAGNOSIS — F410 Panic disorder [episodic paroxysmal anxiety] without agoraphobia: Secondary | ICD-10-CM

## 2020-10-11 DIAGNOSIS — F4310 Post-traumatic stress disorder, unspecified: Secondary | ICD-10-CM

## 2020-10-11 DIAGNOSIS — F424 Excoriation (skin-picking) disorder: Secondary | ICD-10-CM

## 2020-10-11 DIAGNOSIS — F330 Major depressive disorder, recurrent, mild: Secondary | ICD-10-CM | POA: Diagnosis not present

## 2020-10-11 DIAGNOSIS — G4733 Obstructive sleep apnea (adult) (pediatric): Secondary | ICD-10-CM

## 2020-10-11 MED ORDER — DULOXETINE HCL 30 MG PO CPEP: 90.0000 mg | ORAL_CAPSULE | Freq: Every day | ORAL | 0 refills | Status: DC

## 2020-10-11 NOTE — Progress Notes (Signed)
Virtual Visit via Video Note  I connected with Kelli Logan on 10/11/20 at  2:40 PM EDT by a video enabled telemedicine application and verified that I am speaking with the correct person using two identifiers.  Location Provider Location : [facility] Patient Location : Home  Participants: Patient , Provider   I discussed the limitations of evaluation and management by telemedicine and the availability of in person appointments. The patient expressed understanding and agreed to proceed.   I discussed the assessment and treatment plan with the patient. The patient was provided an opportunity to ask questions and all were answered. The patient agreed with the plan and demonstrated an understanding of the instructions.   The patient was advised to call back or seek an in-person evaluation if the symptoms worsen or if the condition fails to improve as anticipated.   BH MD/PA/NP OP Progress Note  10/11/2020 3:11 PM Kelli Logan  MRN:  [id number]  Chief Complaint:  Chief Complaint   Follow-up; Depression    HPI: Kelli Logan is a 60 year old Caucasian female, divorced, lives in [place], has a history of skin picking disorder, PTSD, social anxiety, history of borderline personality disorder, cognitive disorder, breast cancer per history, fibromyalgia, total hip replacement surgery was evaluated by telemedicine today.  Patient continues to tolerate  the Cymbalta well.  She continues to struggle with her mood, lack of motivation, low energy, more so because of her fibromyalgia, her pain.  She reports her house is a mess and she is unable to clean up or get things organized because of her pain.  She does have housing management coming into inspect her  home soon.  Patient reports her anxiety is more under control.  Patient reports sleep as overall okay.  Patient had sleep study completed recently.  Reviewed and discussed sleep study report.  She will benefit from CPAP, has a  diagnosis of obstructive sleep apnea.  Patient denies any suicidality, homicidality or perceptual disturbances.  Patient denies any other concerns today.  Visit Diagnosis:    ICD-10-CM   1. PTSD (post-traumatic stress disorder)  F43.10 DULoxetine (CYMBALTA) 30 MG capsule    2. Skin-picking disorder  F42.4 DULoxetine (CYMBALTA) 30 MG capsule    3. MDD (major depressive disorder), recurrent episode, mild (HCC)  F33.0 DULoxetine (CYMBALTA) 30 MG capsule    4. Panic disorder  F41.0 DULoxetine (CYMBALTA) 30 MG capsule    5. Social anxiety disorder  F40.10 DULoxetine (CYMBALTA) 30 MG capsule    6. Obstructive sleep apnea syndrome  G47.33       Past Psychiatric History: Reviewed past psychiatric history from progress note on 03/10/2018.  Past trials of Paxil, Prozac, Zoloft, Lamictal, Depakote, Klonopin, Seroquel  Past Medical History:  Past Medical History:  Diagnosis Date   Anxiety    Arthritis    Cancer ([place])    breast left   Depression    Fibromyalgia    GERD (gastroesophageal reflux disease)    Headache    History of kidney stones    h/o   Hypertension    Hypothyroidism    Sleep apnea    DOES NOT USE CPAP   Thyroid disease     Past Surgical History:  Procedure Laterality Date   APPENDECTOMY     BREAST BIOPSY Left few yrs ago   stereotactic bx in [place], benign   BREAST BIOPSY Left 12/26/2017   Affirm Bx IMC- X-Clip   BREAST BIOPSY Left 01/14/2018   Affirm Bx- Coil clip-  path pending   BREAST RECONSTRUCTION WITH PLACEMENT OF TISSUE EXPANDER AND ALLODERM Left 04/08/2018   Procedure: LEFT BREAST IMMEDIATE  RECONSTRUCTION WITH EXPANDER AND FLEX HD;  Surgeon: Wallace Going, DO;  Location: [facility];  Service: Plastics;  Laterality: Left;   COLONOSCOPY WITH PROPOFOL N/A 06/26/2018   Procedure: COLONOSCOPY WITH PROPOFOL;  Surgeon: Virgel Manifold, MD;  Location: [facility];  Service: Endoscopy;  Laterality: N/A;   ESOPHAGOGASTRODUODENOSCOPY (EGD) WITH PROPOFOL  N/A 06/26/2018   Procedure: ESOPHAGOGASTRODUODENOSCOPY (EGD) WITH PROPOFOL;  Surgeon: Virgel Manifold, MD;  Location: [facility];  Service: Endoscopy;  Laterality: N/A;   EYE SURGERY Bilateral    lasik   FOOT BONE EXCISION     IMAGE GUIDED SINUS SURGERY  10/220   LIPOSUCTION WITH LIPOFILLING Left 04/30/2019   Procedure: Fat grafting to left breast;  Surgeon: Wallace Going, DO;  Location: [facility];  Service: Plastics;  Laterality: Left;  90 min, please   MASTECTOMY Left 04/08/2019   MASTECTOMY W/ SENTINEL NODE BIOPSY Left 04/08/2018   Procedure: MASTECTOMY WITH SENTINEL LYMPH NODE BIOPSY LEFT;  Surgeon: Fredirick Maudlin, MD;  Location: [facility];  Service: General;  Laterality: Left;   MASTOPEXY Right 08/04/2018   Procedure: MASTOPEXY;  Surgeon: Wallace Going, DO;  Location: [facility];  Service: Plastics;  Laterality: Right;  TOTAL CASE TIME SHOULD BE 3 HOURS, PLEASE   REMOVAL OF TISSUE EXPANDER AND PLACEMENT OF IMPLANT Left 08/04/2018   Procedure: REMOVAL OF TISSUE EXPANDER AND PLACEMENT OF IMPLANT;  Surgeon: Wallace Going, DO;  Location: [facility];  Service: Plastics;  Laterality: Left;   SCAR REVISION Left 04/30/2019   Procedure: release of scar contracture to left breast;  Surgeon: Wallace Going, DO;  Location: [facility];  Service: Plastics;  Laterality: Left;   TOTAL HIP ARTHROPLASTY Left 02/11/2020   Procedure: TOTAL HIP ARTHROPLASTY ANTERIOR APPROACH;  Surgeon: Hessie Knows, MD;  Location: [facility];  Service: Orthopedics;  Laterality: Left;   UMBILICAL HERNIA REPAIR  1983 and 1985    Family Psychiatric History: Reviewed family psychiatric history from progress note on 03/10/2018  Family History:  Family History  Problem Relation Age of Onset   Hypertension Mother    Lung cancer Maternal Grandmother    Heart disease Father    Alcohol abuse Father    Breast cancer Neg Hx    Colon cancer Neg Hx     Social History:  Reviewed social history from progress note on 03/10/2018 Social History   Socioeconomic History   Marital status: Divorced    Spouse name: Not on file   Number of children: 1   Years of education: Not on file   Highest education level: High school graduate  Occupational History   Not on file  Tobacco Use   Smoking status: Former    Packs/day: 1.00    Years: 2.00    Pack years: 2.00    Types: Cigarettes    Quit date: 01/06/2017    Years since quitting: 3.7   Smokeless tobacco: Never  Vaping Use   Vaping Use: Former   Quit date: 11/27/2016  Substance and Sexual Activity   Alcohol use: Yes    Comment: rare beer    Drug use: Not Currently   Sexual activity: Not on file  Other Topics Concern   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  Quit smoking; ocassional alcohol.    Social Determinants of Health   Financial Resource Strain: High Risk   Difficulty of Paying Living Expenses: Very hard  Food Insecurity: Landscape architect Present   Worried About Charity fundraiser in the Last Year: Often true   Arboriculturist in the Last Year: Often true  Transportation Needs: Not on file  Physical Activity: Not on file  Stress: Stress Concern Present   Feeling of Stress : Very much  Social Connections: Not on file    Allergies:  Allergies  Allergen Reactions   Tape Other (See Comments)    Paper tape after breast surgery/Burned skin   Tegaderm and other tape OK    Metabolic Disorder Labs: No results found for: HGBA1C, MPG No results found for: PROLACTIN No results found for: CHOL, TRIG, HDL, CHOLHDL, VLDL, LDLCALC No results found for: TSH  Therapeutic Level Labs: No results found for: LITHIUM No results found for: VALPROATE No components found for:  CBMZ  Current Medications: Current Outpatient Medications  Medication Sig Dispense Refill    atorvastatin (LIPITOR) 40 MG tablet Take 40 mg by mouth at bedtime.      azithromycin (ZITHROMAX) 500 MG tablet Take 1,000 mg by mouth once.     Cephalexin 500 MG tablet Take 500 mg by mouth 3 (three) times daily.     citalopram (CELEXA) 10 MG tablet Take 1 tablet (10 mg total) by mouth daily. 7 tablet 0   Coenzyme Q10 (COQ10) 100 MG CAPS Take 100 mg by mouth every evening.     DIFLUCAN 150 MG tablet Take by mouth.     doxazosin (CARDURA) 1 MG tablet Take 1 tablet (1 mg total) by mouth 2 (two) times daily. Needs office visit for further refills. Thank you! 60 tablet 0   DULoxetine (CYMBALTA) 30 MG capsule Take 3 capsules (90 mg total) by mouth daily. 270 capsule 0   enoxaparin (LOVENOX) 40 MG/0.4ML injection Inject 0.4 mLs (40 mg total) into the skin daily for 12 days. 4.8 mL 0   gabapentin (NEURONTIN) 300 MG capsule Take 300 mg by mouth every morning.     gabapentin (NEURONTIN) 600 MG tablet Take 1 tablet (600 mg total) by mouth as directed. TAKE HALF TABLET IN THE AM AND HALF TABLET AT LUNCH AND 1 TABLET( 600 MG ) AT BEDTIME (Patient taking differently: Take 600 mg by mouth at bedtime.) 180 tablet 0   GLUCOSAMINE-CHONDROITIN PO Take 1-2 tablets by mouth daily. 1500-1200     hydrochlorothiazide (HYDRODIURIL) 25 MG tablet Take 25 mg by mouth daily.      hydrOXYzine (VISTARIL) 50 MG capsule Take 1 capsule (50 mg total) by mouth daily as needed. For sleep, anxiety 90 capsule 0   ipratropium (ATROVENT) 0.03 % nasal spray Place into both nostrils.     levothyroxine (SYNTHROID) 75 MCG tablet Take 75 mcg by mouth every other day. Alternating with 50 mcg every other day-AM     levothyroxine (SYNTHROID, LEVOTHROID) 50 MCG tablet Take 50 mcg by mouth every other day. Alternating with 75 mcg every other day-AM     losartan (COZAAR) 100 MG tablet Take 100 mg by mouth daily. for high blood pressure     metoprolol (LOPRESSOR) 50 MG tablet Take 1 tablet (50 mg total) by mouth 2 (two) times daily. 60 tablet 0    mirtazapine (REMERON) 15 MG tablet Take 1 tablet (15 mg total) by mouth at bedtime. FOR SLEEP 90 tablet 0  Multiple Vitamin (MULTIVITAMIN WITH MINERALS) TABS tablet Take 1 tablet by mouth daily.     Omega-3 Fatty Acids (FISH OIL PO) Take 1 capsule by mouth daily.     omeprazole (PRILOSEC) 20 MG capsule Take 20 mg by mouth every morning.     ondansetron (ZOFRAN ODT) 4 MG disintegrating tablet Take 1 tablet (4 mg total) by mouth every 8 (eight) hours as needed for nausea or vomiting. 20 tablet 0   No current facility-administered medications for this visit.     Musculoskeletal: Strength & Muscle Tone:  UTA Gait & Station:  Seated Patient leans: N/A  Psychiatric Specialty Exam: Review of Systems  Musculoskeletal:  Positive for back pain and myalgias.  Psychiatric/Behavioral:  Positive for dysphoric mood and sleep disturbance.   All other systems reviewed and are negative.  Last menstrual period 02/05/2016.There is no height or weight on file to calculate BMI.  General Appearance: Casual  Eye Contact:  Fair  Speech:  Clear and Coherent  Volume:  Normal  Mood:  Depressed  Affect:  Congruent  Thought Process:  Goal Directed and Descriptions of Associations: Intact  Orientation:  Full (Time, Place, and Person)  Thought Content: Logical   Suicidal Thoughts:  No  Homicidal Thoughts:  No  Memory:  Immediate;   Fair Recent;   Fair Remote;   Fair  Judgement:  Fair  Insight:  Fair  Psychomotor Activity:  Normal  Concentration:  Concentration: Fair and Attention Span: Fair  Recall:  [employer] of Knowledge: Fair  Language: Fair  Akathisia:  No  Handed:  Right  AIMS (if indicated): done  Assets:  Communication Skills Desire for Improvement Housing Social Support  ADL's:  Intact  Cognition: WNL  Sleep:   improving   Screenings: GAD-7    Flowsheet Row Office Visit from 08/30/2020 in [facility]  Total GAD-7 Score 9      PHQ2-9    Flowsheet  Row Counselor from 09/13/2020 in [facility] Visit from 08/30/2020 in [facility] Video Visit from 06/28/2020 in [facility] from 03/24/2020 in [facility]  PHQ-2 Total Score 0 1 3 1   PHQ-9 Total Score -- 11 10 --      Flowsheet Row Counselor from 09/13/2020 in [facility] Counselor from 07/13/2020 in [facility] Video Visit from 06/28/2020 in [facility] No Risk Low Risk Low Risk        Assessment and Plan: Kelli Logan is a 60 year old Caucasian female, divorced, lives in [place], has a history of PTSD, skin picking disorder, panic disorder, social anxiety disorder, multiple other medical problems was evaluated by telemedicine today.  Patient continues to struggle with lack of motivation, low energy more so because of her fibromyalgia pain, will benefit from dosage readjustment of Cymbalta.  Patient with recent diagnosis of obstructive sleep apnea, will benefit from the following plan.  Plan PTSD-stable Mirtazapine 15 mg p.o. nightly Continue CBT  Skin picking disorder-stable Continue CBT  MDD-improving Will increase Cymbalta to 90 mg p.o. daily in divided dosage   Panic disorder-improving Increase Cymbalta as noted above. Continue CBT  Social anxiety disorder-improving Mirtazapine as prescribed Increase Cymbalta to 90 mg p.o. daily Hydroxyzine 50 mg p.o. twice daily as needed  Obstructive sleep apnea-reviewed and discussed sleep study reported on 09/23/2020-per Dr. Clyde Canterbury. Patient will benefit from CPAP titration.  Referral has been sent.  Patient advised to call us back if she does not hear back from the sleep clinic.  High risk medication use-patient has been noncompliant with labs-pending sodium level  Follow-up in clinic in 6 weeks  or sooner if needed.  This note was generated in part or whole with voice recognition software. Voice recognition is usually quite accurate but there are transcription errors that can and very often do occur. I apologize for any typographical errors that were not detected and corrected.     Ursula Alert, MD 10/11/2020, 3:11 PM

## 2020-10-17 ENCOUNTER — Encounter: Payer: Self-pay | Admitting: General Surgery

## 2020-10-17 ENCOUNTER — Other Ambulatory Visit: Payer: Self-pay

## 2020-10-17 ENCOUNTER — Ambulatory Visit (INDEPENDENT_AMBULATORY_CARE_PROVIDER_SITE_OTHER): Payer: Medicare Other | Admitting: Licensed Clinical Social Worker

## 2020-10-17 DIAGNOSIS — F4310 Post-traumatic stress disorder, unspecified: Secondary | ICD-10-CM

## 2020-10-17 NOTE — Progress Notes (Signed)
Virtual Visit via Audio Note  I connected with Kelli Logan on 10/17/20 at  4:00 PM EDT by an audio enabled telemedicine application and verified that I am speaking with the correct person using two identifiers.  Location: Patient: home Provider: remote office [place], [place])   I discussed the limitations of evaluation and management by telemedicine and the availability of in person appointments. The patient expressed understanding and agreed to proceed.  I discussed the assessment and treatment plan with the patient. The patient was provided an opportunity to ask questions and all were answered. The patient agreed with the plan and demonstrated an understanding of the instructions.   The patient was advised to call back or seek an in-person evaluation if the symptoms worsen or if the condition fails to improve as anticipated.  I provided 45 minutes of non-face-to-face time during this encounter.   [place], LCSW   THERAPIST PROGRESS NOTE  Session Time: [phone]  Participation Level: Active  Behavioral Response: NAAlertAnxious and Depressed  Type of Therapy: Individual Therapy  Treatment Goals addressed:  Goal: LTG: Reduce frequency, intensity, and duration of PTSD symptoms so daily functioning is improved: Input needed on appropriate metric.  Patient will score less than 10 on the GAD7(GAD7) Outcome: Progressing Note: Pt reports feeling less anxious and depressed  Goal: STG: Practice interpersonal effectiveness skills 7 times per week for the next 16 weeks Outcome: Progressing Note: Pt is trying hard to engage socially with others on a regular basis  Interventions:  Intervention: Assess emotional status and coping mechanisms  Intervention: Encourage new environment or opportunities for social interaction   Summary: Kelli Logan is a 60 y.o. female who presents with improving symptoms related to PTSD.  Pt reports that her mood is stable and that she is  managing situational stressors well. Pt reports good quality and quantity of sleep. Pt reports compliance with medication.  Allowed pt to explore and express thoughts and feelings associated with recent life situations and external stressors. Discussed stress associated with upcoming [employer] inspection--pt does not feel her house is in a good state. Discussed difference in pt expectations and reality and strategies on how to start small and make a difference in the project. Discussed relationships with others (friend Kelli Logan). Reviewed depression coping skills and stress management suggestions.   Continued recommendations are as follows: self care behaviors, positive social engagements, focusing on overall work/home/life balance, and focusing on positive physical and emotional wellness.   Suicidal/Homicidal: No  Therapist Response: Pt is continuing to apply interventions learned in session into daily life situations. Pt is currently on track to meet goals utilizing interventions mentioned above. Personal growth and progress noted. Treatment to continue as indicated.    Plan: Return again in 4 weeks.  Diagnosis: Axis I: Post Traumatic Stress Disorder    Axis II: No diagnosis    Kelli Bo Sanora Cunanan, LCSW 10/17/2020

## 2020-10-18 NOTE — Plan of Care (Signed)
  Problem: PTSD-Trauma Disorder CCP Problem  1 Reduce the negative impact trauma related symptoms have on social, occupational, and family functioning.  Goal: LTG: Reduce frequency, intensity, and duration of PTSD symptoms so daily functioning is improved: Input needed on appropriate metric.  Patient will score less than 10 on the GAD7(GAD7) Outcome: Progressing Note: Pt reports feeling less anxious and depressed Goal: STG: Practice interpersonal effectiveness skills 7 times per week for the next 16 weeks Outcome: Progressing Note: Pt is trying hard to engage socially with others on a regular basis Intervention: Assess emotional status and coping mechanisms Intervention: Encourage new environment or opportunities for social interaction

## 2020-10-21 ENCOUNTER — Other Ambulatory Visit: Payer: Self-pay

## 2020-10-21 ENCOUNTER — Ambulatory Visit
Admission: RE | Admit: 2020-10-21 | Discharge: 2020-10-21 | Disposition: A | Discharge status: Home / Self Care | Payer: Medicare Other | Source: Ambulatory Visit | Attending: Physician Assistant | Admitting: Physician Assistant

## 2020-10-21 DIAGNOSIS — Z1231 Encounter for screening mammogram for malignant neoplasm of breast: Secondary | ICD-10-CM | POA: Diagnosis present

## 2020-10-25 ENCOUNTER — Inpatient Hospital Stay: Payer: Medicare Other | Admitting: Internal Medicine

## 2020-11-01 ENCOUNTER — Inpatient Hospital Stay: Payer: Medicare Other | Attending: Internal Medicine | Admitting: Internal Medicine

## 2020-11-01 ENCOUNTER — Other Ambulatory Visit: Payer: Self-pay

## 2020-11-01 DIAGNOSIS — C50512 Malignant neoplasm of lower-outer quadrant of left female breast: Secondary | ICD-10-CM

## 2020-11-01 DIAGNOSIS — Z9012 Acquired absence of left breast and nipple: Secondary | ICD-10-CM | POA: Diagnosis not present

## 2020-11-01 DIAGNOSIS — C50412 Malignant neoplasm of upper-outer quadrant of left female breast: Secondary | ICD-10-CM | POA: Insufficient documentation

## 2020-11-01 DIAGNOSIS — Z170 Estrogen receptor positive status [ER+]: Secondary | ICD-10-CM | POA: Insufficient documentation

## 2020-11-01 NOTE — Progress Notes (Signed)
[facility] NOTE  Patient Care Team: Ane Payment, PA as PCP - General (Physician Assistant)  CHIEF COMPLAINTS/PURPOSE OF CONSULTATION: Breast cancer  #  Oncology History Overview Note  # DEC 2019/MARCH 2020- LEFT BREAST IMC ER-Pos [>90%]; PR Neg; Her 2 NEG; 35m; sN0 [pT1b sN0]; stage I; high grade DCIS with comedonecrosis/ calcifications- 5 x5.4cm. s/p mastectomy [Dr.Cannon/[place]];   # Oncotype-RS- 20; Distant recurrence-risk of recurrence 6%.  No adjuvant chemo.  No radiation.  #May 09, 2018-letrozole [LMP- 2018] x discontinued because of severe intolerance [severe fatigue/arthralgias/postcoital pain]- after 1 month.   # anxiety/depression  DIAGNOSIS: Right Breast ca  STAGE:  I; GOALS: cure  CURRENT/MOST RECENT THERAPY: surveilliance     Malignant neoplasm of lower-outer quadrant of left breast of female, estrogen receptor positive (HCC)     HISTORY OF PRESENTING ILLNESS:  DLoye Logan 60y.o.  female history of left breast cancer ER PR positive HER-2 negative stage I status post mastectomy is here for follow-up.  Patient is currently off her adjuvant AI because of severe intolerance.  Otherwise denies any new onset of lumps or bumps.   Review of Systems  Constitutional:  Positive for malaise/fatigue. Negative for chills, diaphoresis, fever and weight loss.  HENT:  Negative for nosebleeds and sore throat.   Eyes:  Negative for double vision.  Respiratory:  Negative for cough, hemoptysis, sputum production, shortness of breath and wheezing.   Cardiovascular:  Negative for chest pain, palpitations, orthopnea and leg swelling.  Gastrointestinal:  Negative for abdominal pain, blood in stool, constipation, diarrhea, heartburn, melena, nausea and vomiting.  Genitourinary:  Negative for dysuria, frequency and urgency.  Musculoskeletal:  Positive for back pain and joint pain.  Skin: Negative.  Negative for itching and rash.   Neurological:  Negative for dizziness, tingling, focal weakness, weakness and headaches.  Endo/Heme/Allergies:  Does not bruise/bleed easily.  Psychiatric/Behavioral:  Negative for depression. The patient is nervous/anxious. The patient does not have insomnia.     MEDICAL HISTORY:  Past Medical History:  Diagnosis Date  . Anxiety   . Arthritis   . Cancer ([facility]    breast left  . Depression   . Fibromyalgia   . GERD (gastroesophageal reflux disease)   . Headache   . History of kidney stones    h/o  . Hypertension   . Hypothyroidism   . Sleep apnea    DOES NOT USE CPAP  . Thyroid disease     SURGICAL HISTORY: Past Surgical History:  Procedure Laterality Date  . APPENDECTOMY    . BREAST BIOPSY Left few yrs ago   [place]  . BREAST BIOPSY Left 12/26/2017   Affirm Bx IMC- X-Clip  . BREAST BIOPSY Left 01/14/2018   Affirm Bx- Coil clip- path pending  . BREAST RECONSTRUCTION WITH PLACEMENT OF TISSUE EXPANDER AND ALLODERM Left 04/08/2018   Procedure: LEFT BREAST IMMEDIATE  RECONSTRUCTION WITH EXPANDER AND FLEX HD;  Surgeon: DWallace Going DO;  Location: [facility];  Service: Plastics;  Laterality: Left;  . COLONOSCOPY WITH PROPOFOL N/A 06/26/2018   Procedure: COLONOSCOPY WITH PROPOFOL;  Surgeon: TVirgel Manifold MD;  Location: [facility];  Service: Endoscopy;  Laterality: N/A;  . ESOPHAGOGASTRODUODENOSCOPY (EGD) WITH PROPOFOL N/A 06/26/2018   Procedure: ESOPHAGOGASTRODUODENOSCOPY (EGD) WITH PROPOFOL;  Surgeon: TVirgel Manifold MD;  Location: [facility];  Service: Endoscopy;  Laterality: N/A;  . EYE SURGERY Bilateral    lasik  . FOOT BONE EXCISION    .  IMAGE GUIDED SINUS SURGERY  10/220  . LIPOSUCTION WITH LIPOFILLING Left 04/30/2019   Procedure: Fat grafting to left breast;  Surgeon: Wallace Going, DO;  Location: [place];  Service: Plastics;  Laterality: Left;  90 min, please  . MASTECTOMY Left 04/08/2019  .  MASTECTOMY W/ SENTINEL NODE BIOPSY Left 04/08/2018   Procedure: MASTECTOMY WITH SENTINEL LYMPH NODE BIOPSY LEFT;  Surgeon: Fredirick Maudlin, MD;  Location: [facility];  Service: General;  Laterality: Left;  Marland Kitchen MASTOPEXY Right 08/04/2018   Procedure: MASTOPEXY;  Surgeon: Wallace Going, DO;  Location: [facility];  Service: Plastics;  Laterality: Right;  TOTAL CASE TIME SHOULD BE 3 HOURS, PLEASE  . REMOVAL OF TISSUE EXPANDER AND PLACEMENT OF IMPLANT Left 08/04/2018   Procedure: REMOVAL OF TISSUE EXPANDER AND PLACEMENT OF IMPLANT;  Surgeon: Wallace Going, DO;  Location: [facility];  Service: Plastics;  Laterality: Left;  . SCAR REVISION Left 04/30/2019   Procedure: release of scar contracture to left breast;  Surgeon: Wallace Going, DO;  Location: [place];  Service: Plastics;  Laterality: Left;  . TOTAL HIP ARTHROPLASTY Left 02/11/2020   Procedure: TOTAL HIP ARTHROPLASTY ANTERIOR APPROACH;  Surgeon: Hessie Knows, MD;  Location: [facility];  Service: Orthopedics;  Laterality: Left;  . UMBILICAL HERNIA REPAIR  1983 and 1985    SOCIAL HISTORY: Social History   Socioeconomic History  . Marital status: Divorced    Spouse name: Not on file  . Number of children: 1  . Years of education: Not on file  . Highest education level: High school graduate  Occupational History  . Not on file  Tobacco Use  . Smoking status: Former    Packs/day: 1.00    Years: 2.00    Pack years: 2.00    Types: Cigarettes    Quit date: 01/06/2017    Years since quitting: 3.8  . Smokeless tobacco: Never  Vaping Use  . Vaping Use: Former  . Quit date: 11/27/2016  Substance and Sexual Activity  . Alcohol use: Yes    Comment: rare beer   . Drug use: Not Currently  . Sexual activity: Not on file  Other Topics Concern  . 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 Quit  smoking; ocassional alcohol.    Social Determinants of Health   Financial Resource Strain: High Risk  . Difficulty of Paying Living Expenses: Very hard  Food Insecurity: Food Insecurity Present  . Worried About Charity fundraiser in the Last Year: Often true  . Ran Out of Food in the Last Year: Often true  Transportation Needs: Not on file  Physical Activity: Not on file  Stress: Stress Concern Present  . Feeling of Stress : Very much  Social Connections: Not on file  Intimate Partner Violence: Not on file    FAMILY HISTORY: Family History  Problem Relation Age of Onset  . Hypertension Mother   . Lung cancer Maternal Grandmother   . Heart disease Father   . Alcohol abuse Father   . Breast cancer Neg Hx   . Colon cancer Neg Hx     ALLERGIES:  is allergic to tape.  MEDICATIONS:  Current Outpatient Medications  Medication Sig Dispense Refill  . atorvastatin (LIPITOR) 40 MG tablet Take 40 mg by mouth at bedtime.     . Coenzyme Q10 (COQ10) 100 MG CAPS Take 100  mg by mouth every evening.    Marland Kitchen doxazosin (CARDURA) 1 MG tablet Take 1 tablet (1 mg total) by mouth 2 (two) times daily. Needs office visit for further refills. Thank you! (Patient taking differently: Take 1 mg by mouth daily. Pt taking once a day) 60 tablet 0  . DULoxetine (CYMBALTA) 30 MG capsule Take 3 capsules (90 mg total) by mouth daily. 270 capsule 0  . gabapentin (NEURONTIN) 300 MG capsule Take 300 mg by mouth every morning.    . gabapentin (NEURONTIN) 600 MG tablet Take 1 tablet (600 mg total) by mouth as directed. TAKE HALF TABLET IN THE AM AND HALF TABLET AT LUNCH AND 1 TABLET( 600 MG ) AT BEDTIME (Patient taking differently: Take 600 mg by mouth at bedtime.) 180 tablet 0  . hydrochlorothiazide (HYDRODIURIL) 25 MG tablet Take 25 mg by mouth daily.     . hydrOXYzine (VISTARIL) 50 MG capsule Take 1 capsule (50 mg total) by mouth daily as needed. For sleep, anxiety 90 capsule 0  . levothyroxine (SYNTHROID) 75 MCG  tablet Take 75 mcg by mouth every other day. Alternating with 50 mcg every other day-AM    . levothyroxine (SYNTHROID, LEVOTHROID) 50 MCG tablet Take 50 mcg by mouth every other day. Alternating with 75 mcg every other day-AM    . losartan (COZAAR) 100 MG tablet Take 100 mg by mouth daily. for high blood pressure    . metoprolol (LOPRESSOR) 50 MG tablet Take 1 tablet (50 mg total) by mouth 2 (two) times daily. 60 tablet 0  . mirtazapine (REMERON) 15 MG tablet Take 1 tablet (15 mg total) by mouth at bedtime. FOR SLEEP 90 tablet 0  . Multiple Vitamin (MULTIVITAMIN WITH MINERALS) TABS tablet Take 1 tablet by mouth daily.    . Omega-3 Fatty Acids (FISH OIL PO) Take 1 capsule by mouth daily.    Marland Kitchen omeprazole (PRILOSEC) 20 MG capsule Take 20 mg by mouth every morning.     No current facility-administered medications for this visit.      Marland Kitchen  PHYSICAL EXAMINATION: ECOG PERFORMANCE STATUS: 0 - Asymptomatic  Vitals:   11/01/20 1537  BP: (!) 144/99  Pulse: 86  Resp: 16  Temp: 98.3 F (36.8 C)  SpO2: 100%   Filed Weights   11/01/20 1537  Weight: 207 lb (93.9 kg)    Physical Exam HENT:     Head: Normocephalic and atraumatic.     Mouth/Throat:     Pharynx: No oropharyngeal exudate.  Eyes:     Pupils: Pupils are equal, round, and reactive to light.  Cardiovascular:     Rate and Rhythm: Normal rate and regular rhythm.  Pulmonary:     Effort: Pulmonary effort is normal. No respiratory distress.     Breath sounds: Normal breath sounds. No wheezing.  Abdominal:     General: Bowel sounds are normal. There is no distension.     Palpations: Abdomen is soft. There is no mass.     Tenderness: There is no abdominal tenderness. There is no guarding or rebound.  Musculoskeletal:        General: No tenderness. Normal range of motion.     Cervical back: Normal range of motion and neck supple.  Skin:    General: Skin is warm.     Comments:    Neurological:     Mental Status: She is alert and  oriented to person, place, and time.  Psychiatric:        Mood and Affect:  Affect normal.     LABORATORY DATA:  I have reviewed the data as listed Lab Results  Component Value Date   WBC 12.1 (H) 02/11/2020   HGB 11.7 (L) 02/11/2020   HCT 34.7 (L) 02/11/2020   MCV 85.0 02/11/2020   PLT 272 02/11/2020   Recent Labs    02/05/20 1414 02/11/20 1343  NA 133*  --   K 4.0  --   CL 97*  --   CO2 27  --   GLUCOSE 92  --   BUN 12  --   CREATININE 0.87 0.91  CALCIUM 9.2  --   GFRNONAA >60 >60  PROT 6.5  --   ALBUMIN 3.7  --   AST 24  --   ALT 25  --   ALKPHOS 62  --   BILITOT 0.9  --     RADIOGRAPHIC STUDIES: I have personally reviewed the radiological images as listed and agreed with the findings in the report. MM 3D SCREEN BREAST UNI RIGHT  Result Date: 10/27/2020 CLINICAL DATA:  Screening. EXAM: DIGITAL SCREENING UNILATERAL RIGHT MAMMOGRAM WITH CAD AND TOMOSYNTHESIS TECHNIQUE: Right screening digital craniocaudal and mediolateral oblique mammograms were obtained. Right screening digital breast tomosynthesis was performed. The images were evaluated with computer-aided detection. COMPARISON:  Previous exam(s). ACR Breast Density Category b: There are scattered areas of fibroglandular density. FINDINGS: There are no findings suspicious for malignancy. IMPRESSION: No mammographic evidence of malignancy. A result letter of this screening mammogram will be mailed directly to the patient. RECOMMENDATION: Screening mammogram in one year. (Code:SM-B-01Y) BI-RADS CATEGORY  1: Negative. Electronically Signed   By: Abelardo Diesel M.D.   On: 10/27/2020 09:51    ASSESSMENT & PLAN:   Malignant neoplasm of lower-outer quadrant of left breast of female, estrogen receptor positive ([place]) # Stage I left breast IMC; pT1b [6 mm] ER-Pos; PR-neg; Her 2 neu status post mastectomy.  LOW risk-Oncotype.  Discontinued letrozole- because of severe intolerance.[severe fatigue/joint pains hot flashes/post  coital pain].  OCT 2022- Mammo [PCP]- WNL.  #  Colonoscopy [id number]; Dr.T]- polyp-continued surveillance is recommended.  # DISPOSITION:  # follow up in 11 month- MD; no labs--Dr.B   All questions were answered. The patient knows to call the clinic with any problems, questions or concerns.    Cammie Sickle, MD 11/01/2020 4:09 PM

## 2020-11-01 NOTE — Progress Notes (Signed)
Pt in for yearly follow up.  States had mammogram in the last week. Pt had left hip replacement in Feb 2022.  Pt denies any concerns today.

## 2020-11-01 NOTE — Assessment & Plan Note (Addendum)
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  LOW risk-Oncotype.  Discontinued letrozole- because of severe intolerance.[severe fatigue/joint pains hot flashes/post coital pain].  OCT 2022- Mammo [PCP]- WNL.  #  Colonoscopy [id number]; Dr.T]- polyp-continued surveillance is recommended.  # DISPOSITION:  # follow up in 48 month- MD; no labs--Dr.B

## 2020-11-07 ENCOUNTER — Other Ambulatory Visit: Payer: Medicare Other | Attending: Psychiatry

## 2020-11-09 ENCOUNTER — Ambulatory Visit: Payer: Medicare Other | Attending: Neurology

## 2020-11-09 DIAGNOSIS — G4733 Obstructive sleep apnea (adult) (pediatric): Secondary | ICD-10-CM | POA: Insufficient documentation

## 2020-11-09 DIAGNOSIS — G4761 Periodic limb movement disorder: Secondary | ICD-10-CM | POA: Insufficient documentation

## 2020-11-10 ENCOUNTER — Other Ambulatory Visit: Payer: Self-pay

## 2020-11-11 ENCOUNTER — Telehealth: Payer: Self-pay | Admitting: Psychiatry

## 2020-11-11 DIAGNOSIS — G4733 Obstructive sleep apnea (adult) (pediatric): Secondary | ICD-10-CM

## 2020-11-11 MED ORDER — NONFORMULARY OR COMPOUNDED ITEM: 1.0000 | 0 refills | Status: AC

## 2020-11-11 NOTE — Telephone Encounter (Signed)
Sleep study report was reviewed, [place] prescription for CPAP with setting instructions.  Janett Billow CMA  to order the same .

## 2020-11-23 ENCOUNTER — Telehealth (INDEPENDENT_AMBULATORY_CARE_PROVIDER_SITE_OTHER): Payer: Medicare Other | Admitting: Psychiatry

## 2020-11-23 ENCOUNTER — Encounter: Payer: Self-pay | Admitting: Psychiatry

## 2020-11-23 ENCOUNTER — Other Ambulatory Visit: Payer: Self-pay

## 2020-11-23 DIAGNOSIS — F424 Excoriation (skin-picking) disorder: Secondary | ICD-10-CM | POA: Diagnosis not present

## 2020-11-23 DIAGNOSIS — F4310 Post-traumatic stress disorder, unspecified: Secondary | ICD-10-CM | POA: Diagnosis not present

## 2020-11-23 DIAGNOSIS — F410 Panic disorder [episodic paroxysmal anxiety] without agoraphobia: Secondary | ICD-10-CM | POA: Diagnosis not present

## 2020-11-23 DIAGNOSIS — G4733 Obstructive sleep apnea (adult) (pediatric): Secondary | ICD-10-CM

## 2020-11-23 DIAGNOSIS — F3342 Major depressive disorder, recurrent, in full remission: Secondary | ICD-10-CM

## 2020-11-23 DIAGNOSIS — F4010 Social phobia, unspecified: Secondary | ICD-10-CM

## 2020-11-23 DIAGNOSIS — F330 Major depressive disorder, recurrent, mild: Secondary | ICD-10-CM

## 2020-11-23 MED ORDER — DULOXETINE HCL 30 MG PO CPEP: 30.0000 mg | ORAL_CAPSULE | Freq: Every day | ORAL | 0 refills | Status: DC

## 2020-11-23 NOTE — Progress Notes (Signed)
Virtual Visit via Video Note  I connected with Kelli Logan on 11/23/20 at  2:20 PM EST by a video enabled telemedicine application and verified that I am speaking with the correct person using two identifiers.  Location Provider Location : [facility] Patient Location : Home  Participants: Patient , Provider   I discussed the limitations of evaluation and management by telemedicine and the availability of in person appointments. The patient expressed understanding and agreed to proceed.   I discussed the assessment and treatment plan with the patient. The patient was provided an opportunity to ask questions and all were answered. The patient agreed with the plan and demonstrated an understanding of the instructions.   The patient was advised to call back or seek an in-person evaluation if the symptoms worsen or if the condition fails to improve as anticipated.    [place] MD OP Progress Note  11/23/2020 2:21 PM Kelli Logan  MRN:  [id number]  Chief Complaint:  Chief Complaint   Follow-up; Depression; Anxiety    HPI: Kelli Logan is a 60 year old Caucasian female, divorced, lives in [place], has a history of skin picking disorder, PTSD, social anxiety, history of borderline personality disorder, cognitive disorder, breast cancer per history, fibromyalgia, total hip replacement surgery was evaluated by telemedicine today.  Patient today reports she continues to have a lot of psychosocial stressors, currently going through [employer] inspections.  She reports that has been very stressful for her.  She is worried about the outcome.  Otherwise denies any significant mood symptoms.  Reports her depression has been stable.  She reports she feels the same on the Cymbalta 90 mg and may have felt the same on Cymbalta 30 mg and would like to go back on the 30 mg since she does not understand why she should be on a higher dosage but it is not making a difference.  She does not feel depressed at  this time and reports anxiety symptoms are due to situational stressors.  She agrees to continue to follow-up with her therapist for the same.  Reports she does struggle with low energy, was recently diagnosed with obstructive sleep apnea. She is awaiting CPAP.  Reviewed and discussed sleep study report with patient.  She meets criteria for obstructive sleep apnea and periodic limb movement.  Patient denies any suicidality, homicidality or perceptual disturbances.  Patient denies any other concerns today.    Visit Diagnosis:    ICD-10-CM   1. PTSD (post-traumatic stress disorder)  F43.10 DULoxetine (CYMBALTA) 30 MG capsule    2. Skin-picking disorder  F42.4 DULoxetine (CYMBALTA) 30 MG capsule    3. MDD (major depressive disorder), recurrent, in full remission ([place])  F33.42 DULoxetine (CYMBALTA) 30 MG capsule    4. Panic disorder  F41.0 DULoxetine (CYMBALTA) 30 MG capsule    5. Social anxiety disorder  F40.10 DULoxetine (CYMBALTA) 30 MG capsule    6. Obstructive sleep apnea syndrome  G47.33       Past Psychiatric History: Reviewed past psychiatric history from progress note on 03/10/2018.  Past trials of Paxil, Prozac, Zoloft, Lamictal, Depakote, Klonopin, Seroquel  Past Medical History:  Past Medical History:  Diagnosis Date   Anxiety    Arthritis    Cancer ([place])    breast left   Depression    Fibromyalgia    GERD (gastroesophageal reflux disease)    Headache    History of kidney stones    h/o   Hypertension    Hypothyroidism  Sleep apnea    DOES NOT USE CPAP   Thyroid disease     Past Surgical History:  Procedure Laterality Date   APPENDECTOMY     BREAST BIOPSY Left few yrs ago   stereotactic bx in [place], benign   BREAST BIOPSY Left 12/26/2017   Affirm Bx IMC- X-Clip   BREAST BIOPSY Left 01/14/2018   Affirm Bx- Coil clip- path pending   BREAST RECONSTRUCTION WITH PLACEMENT OF TISSUE EXPANDER AND ALLODERM Left 04/08/2018   Procedure: LEFT BREAST IMMEDIATE   RECONSTRUCTION WITH EXPANDER AND FLEX HD;  Surgeon: Wallace Going, DO;  Location: [facility];  Service: Plastics;  Laterality: Left;   COLONOSCOPY WITH PROPOFOL N/A 06/26/2018   Procedure: COLONOSCOPY WITH PROPOFOL;  Surgeon: Virgel Manifold, MD;  Location: [facility];  Service: Endoscopy;  Laterality: N/A;   ESOPHAGOGASTRODUODENOSCOPY (EGD) WITH PROPOFOL N/A 06/26/2018   Procedure: ESOPHAGOGASTRODUODENOSCOPY (EGD) WITH PROPOFOL;  Surgeon: Virgel Manifold, MD;  Location: [facility];  Service: Endoscopy;  Laterality: N/A;   EYE SURGERY Bilateral    lasik   FOOT BONE EXCISION     IMAGE GUIDED SINUS SURGERY  10/220   LIPOSUCTION WITH LIPOFILLING Left 04/30/2019   Procedure: Fat grafting to left breast;  Surgeon: Wallace Going, DO;  Location: [facility];  Service: Plastics;  Laterality: Left;  90 min, please   MASTECTOMY Left 04/08/2019   MASTECTOMY W/ SENTINEL NODE BIOPSY Left 04/08/2018   Procedure: MASTECTOMY WITH SENTINEL LYMPH NODE BIOPSY LEFT;  Surgeon: Fredirick Maudlin, MD;  Location: [facility];  Service: General;  Laterality: Left;   MASTOPEXY Right 08/04/2018   Procedure: MASTOPEXY;  Surgeon: Wallace Going, DO;  Location: [facility];  Service: Plastics;  Laterality: Right;  TOTAL CASE TIME SHOULD BE 3 HOURS, PLEASE   REMOVAL OF TISSUE EXPANDER AND PLACEMENT OF IMPLANT Left 08/04/2018   Procedure: REMOVAL OF TISSUE EXPANDER AND PLACEMENT OF IMPLANT;  Surgeon: Wallace Going, DO;  Location: [facility];  Service: Plastics;  Laterality: Left;   SCAR REVISION Left 04/30/2019   Procedure: release of scar contracture to left breast;  Surgeon: Wallace Going, DO;  Location: [facility];  Service: Plastics;  Laterality: Left;   TOTAL HIP ARTHROPLASTY Left 02/11/2020   Procedure: TOTAL HIP ARTHROPLASTY ANTERIOR APPROACH;  Surgeon: Hessie Knows, MD;  Location: [facility];  Service: Orthopedics;  Laterality: Left;   UMBILICAL HERNIA REPAIR   1983 and 1985    Family Psychiatric History: Reviewed family psychiatric history from progress note on 03/10/2018  Family History:  Family History  Problem Relation Age of Onset   Hypertension Mother    Lung cancer Maternal Grandmother    Heart disease Father    Alcohol abuse Father    Breast cancer Neg Hx    Colon cancer Neg Hx     Social History: Reviewed social history from progress note on 03/10/2018 Social History   Socioeconomic History   Marital status: Divorced    Spouse name: Not on file   Number of children: 1   Years of education: Not on file   Highest education level: High school graduate  Occupational History   Not on file  Tobacco Use   Smoking status: Former    Packs/day: 1.00    Years: 2.00    Pack years: 2.00    Types: Cigarettes    Quit date: 01/06/2017    Years since quitting: 3.8   Smokeless tobacco: Never  Vaping Use   Vaping  Use: Former   Quit date: 11/27/2016  Substance and Sexual Activity   Alcohol use: Yes    Comment: rare beer    Drug use: Not Currently   Sexual activity: Not on file  Other Topics Concern   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 Quit smoking; ocassional alcohol.    Social Determinants of Health   Financial Resource Strain: High Risk   Difficulty of Paying Living Expenses: Very hard  Food Insecurity: Landscape architect Present   Worried About Charity fundraiser in the Last Year: Often true   Arboriculturist in the Last Year: Often true  Transportation Needs: Not on file  Physical Activity: Not on file  Stress: Stress Concern Present   Feeling of Stress : Very much  Social Connections: Not on file    Allergies:  Allergies  Allergen Reactions   Tape Other (See Comments)    Paper tape after breast surgery/Burned skin   Tegaderm and other tape OK    Metabolic Disorder Labs: No results found  for: HGBA1C, MPG No results found for: PROLACTIN No results found for: CHOL, TRIG, HDL, CHOLHDL, VLDL, LDLCALC No results found for: TSH  Therapeutic Level Labs: No results found for: LITHIUM No results found for: VALPROATE No components found for:  CBMZ  Current Medications: Current Outpatient Medications  Medication Sig Dispense Refill   atorvastatin (LIPITOR) 40 MG tablet Take 40 mg by mouth at bedtime.      Coenzyme Q10 (COQ10) 100 MG CAPS Take 100 mg by mouth every evening.     doxazosin (CARDURA) 1 MG tablet Take 1 tablet (1 mg total) by mouth 2 (two) times daily. Needs office visit for further refills. Thank you! (Patient taking differently: Take 1 mg by mouth daily. Pt taking once a day) 60 tablet 0   DULoxetine (CYMBALTA) 30 MG capsule Take 1 capsule (30 mg total) by mouth daily. Dose reduction 90 capsule 0   gabapentin (NEURONTIN) 300 MG capsule Take 300 mg by mouth every morning.     gabapentin (NEURONTIN) 600 MG tablet Take 1 tablet (600 mg total) by mouth as directed. TAKE HALF TABLET IN THE AM AND HALF TABLET AT LUNCH AND 1 TABLET( 600 MG ) AT BEDTIME (Patient taking differently: Take 600 mg by mouth at bedtime.) 180 tablet 0   hydrochlorothiazide (HYDRODIURIL) 25 MG tablet Take 25 mg by mouth daily.      hydrOXYzine (VISTARIL) 50 MG capsule Take 1 capsule (50 mg total) by mouth daily as needed. For sleep, anxiety 90 capsule 0   levothyroxine (SYNTHROID) 75 MCG tablet Take 75 mcg by mouth every other day. Alternating with 50 mcg every other day-AM     levothyroxine (SYNTHROID, LEVOTHROID) 50 MCG tablet Take 50 mcg by mouth every other day. Alternating with 75 mcg every other day-AM     losartan (COZAAR) 100 MG tablet Take 100 mg by mouth daily. for high blood pressure     methocarbamol (ROBAXIN) 500 MG tablet Take 250-500 mg by mouth at bedtime as needed.     metoprolol (LOPRESSOR) 50 MG tablet Take 1 tablet (50 mg total) by mouth 2 (two) times daily. 60 tablet 0   mirtazapine  (REMERON) 15 MG tablet Take 1 tablet (15 mg total) by mouth at bedtime. FOR SLEEP 90 tablet 0   Multiple Vitamin (MULTIVITAMIN WITH MINERALS) TABS tablet Take 1 tablet  by mouth daily.     NONFORMULARY OR COMPOUNDED ITEM 1 Device by Other route as directed. CPAP-with the pressure setting of 10 cm H20 with heated humidity and ResMed AirFit F20 Full Face Mask , size small. 1 each 0   Omega-3 Fatty Acids (FISH OIL PO) Take 1 capsule by mouth daily.     omeprazole (PRILOSEC) 20 MG capsule Take 20 mg by mouth every morning.     No current facility-administered medications for this visit.     Musculoskeletal: Strength & Muscle Tone:  UTA Gait & Station:  Seated Patient leans: N/A  Psychiatric Specialty Exam: Review of Systems  Musculoskeletal:  Positive for back pain.  Psychiatric/Behavioral:  The patient is nervous/anxious.   All other systems reviewed and are negative.  Last menstrual period 02/05/2016.There is no height or weight on file to calculate BMI.  General Appearance: Casual  Eye Contact:  Fair  Speech:  Clear and Coherent  Volume:  Normal  Mood:  Anxious  Affect:  Congruent  Thought Process:  Goal Directed and Descriptions of Associations: Intact  Orientation:  Full (Time, Place, and Person)  Thought Content: Rumination   Suicidal Thoughts:  No  Homicidal Thoughts:  No  Memory:  Immediate;   Fair Recent;   Fair Remote;   Fair  Judgement:  Fair  Insight:  Fair  Psychomotor Activity:  Normal  Concentration:  Concentration: Fair and Attention Span: Fair  Recall:  [employer] of Knowledge: Fair  Language: Fair  Akathisia:  No  Handed:  Right  AIMS (if indicated): done, 0  Assets:  Communication Skills Desire for Improvement Housing  ADL's:  Intact  Cognition: WNL  Sleep:  Fair   Screenings: GAD-7    Flowsheet Row Office Visit from 08/30/2020 in [facility]  Total GAD-7 Score 9      PHQ2-9    Flowsheet Row Counselor from  09/13/2020 in [facility] Visit from 08/30/2020 in [facility] Video Visit from 06/28/2020 in [facility] Counselor from 03/24/2020 in [facility]  PHQ-2 Total Score 0 1 3 1   PHQ-9 Total Score -- 11 10 --      Flowsheet Row Counselor from 09/13/2020 in [facility] Counselor from 07/13/2020 in [facility] Video Visit from 06/28/2020 in [facility] No Risk Low Risk Low Risk        Assessment and Plan: Vermelle Cammarata is a 60 year old Caucasian female, divorced, lives in [place], has a history of PTSD, skin picking disorder, panic disorder, social anxiety disorder, multiple medical problems was evaluated by telemedicine today.  Patient with recent psychosocial stressors struggles with anxiety, as well as low energy, recent diagnosis of obstructive sleep apnea and periodic limb movement, will benefit from the following plan.  Plan PTSD-stable Mirtazapine 15 mg p.o. nightly Continue CBT  Skin picking disorder-stable Continue CBT  MDD-in remission Reduce Cymbalta to 30 mg p.o. daily based on patient preference.  Panic disorder-stable Cymbalta as prescribed Continue CBT  Social anxiety disorder-improving Mirtazapine as prescribed Cymbalta 30 mg p.o. daily-reduced dosage Hydroxyzine 50 mg p.o. twice daily as needed Continue CBT   Obstructive sleep apnea-patient does not meet criteria for sleep apnea, Periodic limb movement. Patient is awaiting CPAP.  Follow-up in clinic in 6 to 12 weeks or sooner if needed.  Patient advised to get fitted for CPAP, and give it a few weeks before following  up with writer for further medication changes .  Patient to call by clinic to schedule appointment as needed.  This note was generated in part or whole with voice recognition  software. Voice recognition is usually quite accurate but there are transcription errors that can and very often do occur. I apologize for any typographical errors that were not detected and corrected.        Ursula Alert, MD 11/24/2020, 12:01 PM

## 2020-12-05 ENCOUNTER — Ambulatory Visit (INDEPENDENT_AMBULATORY_CARE_PROVIDER_SITE_OTHER): Payer: Medicare Other | Admitting: Licensed Clinical Social Worker

## 2020-12-05 ENCOUNTER — Other Ambulatory Visit: Payer: Self-pay

## 2020-12-05 DIAGNOSIS — F4310 Post-traumatic stress disorder, unspecified: Secondary | ICD-10-CM

## 2020-12-05 NOTE — Progress Notes (Signed)
Virtual Visit via Audio Note  I connected with Kelli Logan on 12/05/20 at  4:00 PM EST by an audio enabled telemedicine application and verified that I am speaking with the correct person using two identifiers.  Location: Patient: home Provider: remote office [place], [place])   I discussed the limitations of evaluation and management by telemedicine and the availability of in person appointments. The patient expressed understanding and agreed to proceed.  I discussed the assessment and treatment plan with the patient. The patient was provided an opportunity to ask questions and all were answered. The patient agreed with the plan and demonstrated an understanding of the instructions.   The patient was advised to call back or seek an in-person evaluation if the symptoms worsen or if the condition fails to improve as anticipated.  I provided 60 minutes of non-face-to-face time during this encounter.   Kelli Breslin R Davis Ambrosini, LCSW   THERAPIST PROGRESS NOTE  Session Time: 4-5p   Participation Level: Active  Behavioral Response: NAAlertAnxious and Depressed  Type of Therapy: Individual Therapy  Treatment Goals addressed:  Goal: LTG: Reduce frequency, intensity, and duration of PTSD symptoms so daily functioning is improved: Input needed on appropriate metric.  Patient will score less than 10 on the GAD7(GAD7) Outcome: Progressing Note: Pt reports feeling less anxious and depressed  Goal: STG: Practice interpersonal effectiveness skills 7 times per week for the next 16 weeks Outcome: Progressing Note: Pt is trying hard to engage socially with others on a regular basis  Interventions:  Intervention: Assess emotional status and coping mechanisms  Intervention: Encourage new environment or opportunities for social interaction   Summary: Kelli Logan is a 60 y.o. female who presents with improving symptoms related to PTSD.  Pt reports that her mood is stable and that she is  managing situational stressors well. Pt reports good quality and quantity of sleep. Pt reports compliance with medication.  Allowed pt to explore and express thoughts and feelings associated with recent life situations and external stressors. Discussed recent housing authority inspection--which pt did not pass. Discussed reasons why pt did not pass and steps that pt took to eventually pass the inspection. Discussed financial concerns--pt discussed her recent inheritance and lawsuit payout. Pt states that this showed as deposits in her bank account and because of this, her subsidized rent may go up. This is triggering stress because pt states that the money she received is a one-time payout, but her rent would continue to be up to $200 a month higher each month for a year until she gets re-evaluated. Discussed gambling behaviors--pt states that it is currently at a very minor level and that she is not gambling more than 5-10 dollars at a time. "I win more than I spend".   Pt states she's taking a break from dating for a while.   Explored relationship with son--pt did speak with him over Thanksgiving holiday. Pt stated she would like to move back to [place] but knows that her house is gone and there is no way she can find housing that is affordable without knowing someone that could help.   Continued recommendations are as follows: self care behaviors, positive social engagements, focusing on overall work/home/life balance, and focusing on positive physical and emotional wellness.   Suicidal/Homicidal: No  Therapist Response: Pt is continuing to apply interventions learned in session into daily life situations. Pt is currently on track to meet goals utilizing interventions mentioned above. Personal growth and progress noted. Treatment to continue as  indicated.    Plan: Return again in 4 weeks.  Diagnosis: Axis I: Post Traumatic Stress Disorder    Axis II: No diagnosis    Rachel Bo Dexter Signor,  LCSW 12/05/2020

## 2020-12-06 NOTE — Plan of Care (Signed)
  Problem: PTSD-Trauma Disorder CCP Problem  1 Reduce the negative impact trauma related symptoms have on social, occupational, and family functioning.  Goal: LTG: Reduce frequency, intensity, and duration of PTSD symptoms so daily functioning is improved: Input needed on appropriate metric.  Patient will score less than 10 on the GAD7(GAD7) Outcome: Progressing Goal: STG: Practice interpersonal effectiveness skills 7 times per week for the next 16 weeks Outcome: Progressing

## 2020-12-13 ENCOUNTER — Telehealth: Payer: Self-pay

## 2020-12-13 NOTE — Telephone Encounter (Signed)
pt called states that she not able to get her CPAP supplies that [facility] states that they need to do a face to face with Dr. Shea Evans.

## 2020-12-13 NOTE — Telephone Encounter (Signed)
Please call [facility] and find out and please let me know.

## 2021-01-04 ENCOUNTER — Telehealth: Payer: Self-pay

## 2021-01-04 NOTE — Telephone Encounter (Signed)
Medication management - Telephone call with pt twice and then with Loma Sousa, representative from [facility] with [facility] to assist pt with getting them Dr. Charlcie Cradle original progress note that outlined pt's need for a sleep study referral dated 08/30/20.  Patient requested this note be sent to faxed to [facility] so she could then obtain her CPAP machine and equipment to begin use.  Collateral from [facility] reported this should be all that is still needed and that their office would contact us back if any thing else needed.  Copy of sent note sent to scan per patients request this was faxed this date.

## 2021-01-05 ENCOUNTER — Encounter: Payer: Self-pay | Admitting: Plastic Surgery

## 2021-01-05 ENCOUNTER — Ambulatory Visit (INDEPENDENT_AMBULATORY_CARE_PROVIDER_SITE_OTHER): Payer: Medicare Other | Admitting: Plastic Surgery

## 2021-01-05 ENCOUNTER — Other Ambulatory Visit: Payer: Self-pay

## 2021-01-05 VITALS — BP 123/83 | HR 92 | Ht 66.0 in | Wt 202.0 lb

## 2021-01-05 DIAGNOSIS — Z9012 Acquired absence of left breast and nipple: Secondary | ICD-10-CM

## 2021-01-05 DIAGNOSIS — Z9889 Other specified postprocedural states: Secondary | ICD-10-CM

## 2021-01-05 DIAGNOSIS — N651 Disproportion of reconstructed breast: Secondary | ICD-10-CM

## 2021-01-05 NOTE — Progress Notes (Signed)
° °  Subjective:    Patient ID: Kelli Logan, female    DOB: 1960-08-30, 60 y.o.   MRN: [id number]  The patient is a 60 year old female here for follow-up on her breast reconstruction.  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  She had immediate reconstruction.  In July 2020 she had a left breast Mentor ultra high 650 cc implant placed and a right mastopexy.  She is 5 feet 6 inches tall and now weighs 202 pounds.  She is up from 187 pounds.  In April 2021 she underwent left breast capsule release with fat grafting.  She is overall doing well and has very good symmetry.  She complains that she does not feel symmetric in her bra.  She feels like she needs to have a little bit more upper pole fullness in the right breast.  She tried some sizers on and feels like 150 cc would work for her.  She had a mammogram in October which was negative.     Review of Systems  Constitutional: Negative.   Eyes: Negative.   Respiratory: Negative.    Cardiovascular: Negative.   Gastrointestinal: Negative.   Endocrine: Negative.   Genitourinary: Negative.   Musculoskeletal: Negative.   Skin: Negative.   Hematological: Negative.       Objective:   Physical Exam Vitals reviewed.  Constitutional:      Appearance: Normal appearance.  HENT:     Head: Normocephalic.  Cardiovascular:     Rate and Rhythm: Normal rate.     Pulses: Normal pulses.  Pulmonary:     Effort: Pulmonary effort is normal. No respiratory distress.  Musculoskeletal:        General: No swelling.  Skin:    General: Skin is warm.     Capillary Refill: Capillary refill takes less than 2 seconds.  Neurological:     Mental Status: She is alert and oriented to person, place, and time.  Psychiatric:        Mood and Affect: Mood normal.        Behavior: Behavior normal.        Thought Content: Thought content normal.        Assessment & Plan:     ICD-10-CM   1. S/P  breast reconstruction, left  Z98.890     2. Status post left mastectomy  Z90.12     3. Breast asymmetry following reconstructive surgery  N65.1       Pictures were obtained of the patient and placed in the chart with the patient's or guardian's permission.  I think it is reasonable to place 150 cc implant or something similar in the right breast for better symmetry.  I think it would be best if she waited until she lost some weight but she states that she does not think she will and that she is most likely going to stay at this week.  We can also do liposuction of the lateral left breast to improve the contour.

## 2021-01-06 ENCOUNTER — Ambulatory Visit: Payer: Medicare Other | Admitting: Plastic Surgery

## 2021-01-07 ENCOUNTER — Other Ambulatory Visit: Payer: Self-pay | Admitting: Psychiatry

## 2021-01-07 DIAGNOSIS — F4310 Post-traumatic stress disorder, unspecified: Secondary | ICD-10-CM

## 2021-01-24 ENCOUNTER — Telehealth: Payer: Self-pay

## 2021-01-24 DIAGNOSIS — F4310 Post-traumatic stress disorder, unspecified: Secondary | ICD-10-CM

## 2021-01-24 MED ORDER — HYDROXYZINE PAMOATE 50 MG PO CAPS: 50.0000 mg | ORAL_CAPSULE | Freq: Every day | ORAL | 1 refills | Status: DC | PRN

## 2021-01-24 NOTE — Telephone Encounter (Signed)
I have sent hydroxyzine to pharmacy.  Patient was advised to call the clinic back during her last appointment after CPAP fitting to schedule an appointment for further medication changes as needed.

## 2021-01-24 NOTE — Telephone Encounter (Signed)
Medication refill request - Fax received from pt's [facility] in [place] for a refill of her past prescribed Hydroxyzine, last ordered 09/15/20 for a 90 day supply and filled on 10/28/20.  Pt's last appt 11/23/20 with no current appt scheduled.

## 2021-01-25 ENCOUNTER — Other Ambulatory Visit: Payer: Self-pay

## 2021-01-25 ENCOUNTER — Ambulatory Visit (INDEPENDENT_AMBULATORY_CARE_PROVIDER_SITE_OTHER): Payer: Medicare Other | Admitting: Licensed Clinical Social Worker

## 2021-01-25 DIAGNOSIS — F4310 Post-traumatic stress disorder, unspecified: Secondary | ICD-10-CM
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
## 2021-01-26 ENCOUNTER — Encounter: Payer: Self-pay | Admitting: Licensed Clinical Social Worker

## 2021-01-26 NOTE — Plan of Care (Signed)
°  Problem: PTSD-Trauma Disorder CCP Problem  1 Reduce the negative impact trauma related symptoms have on social, occupational, and family functioning.  Goal: LTG: Reduce frequency, intensity, and duration of PTSD symptoms so daily functioning is improved: Input needed on appropriate metric.  Patient will score less than 10 on the GAD7(GAD7) Outcome: Progressing Goal: STG: Practice interpersonal effectiveness skills 7 times per week for the next 16 weeks Outcome: Progressing

## 2021-01-30 ENCOUNTER — Emergency Department
Admission: EM | Admit: 2021-01-30 | Discharge: 2021-01-30 | Disposition: A | Discharge status: Home / Self Care | Payer: Medicare Other | Attending: Emergency Medicine | Admitting: Emergency Medicine

## 2021-01-30 DIAGNOSIS — Z79899 Other long term (current) drug therapy: Secondary | ICD-10-CM | POA: Diagnosis not present

## 2021-01-30 DIAGNOSIS — S61011A Laceration without foreign body of right thumb without damage to nail, initial encounter: Secondary | ICD-10-CM | POA: Insufficient documentation

## 2021-01-30 DIAGNOSIS — S6991XA Unspecified injury of right wrist, hand and finger(s), initial encounter: Secondary | ICD-10-CM | POA: Diagnosis present

## 2021-01-30 DIAGNOSIS — W25XXXA Contact with sharp glass, initial encounter: Secondary | ICD-10-CM | POA: Diagnosis not present

## 2021-01-30 MED ORDER — LIDOCAINE HCL (PF) 1 % IJ SOLN
5.0000 mL | Freq: Once | INTRAMUSCULAR | Status: AC
  Administered 2021-01-30: 5 mL
  Filled 2021-01-30: qty 5

## 2021-01-30 NOTE — ED Provider Notes (Signed)
[facility] EMERGENCY DEPARTMENT Provider Note   CSN: [id number] Arrival date & time: 01/30/21  2148     History  Chief Complaint  Patient presents with   Laceration    Kelli Logan is a 61 y.o. female presents to the emergency department for evaluation of a right thumb laceration that occurred just prior to arrival.  Patient states she had a large broken piece of glass that she was trying to break in the trash can, she excellently sliced her right thumb along the volar aspect of the interphalangeal joint with no limitations in active flexion or extension.  Her tetanus is up-to-date.  Bleeding has been moderate but finally able to get it to stop with compression upon arrival here in the ER.  No tingling or numbness.  HPI     Home Medications Prior to Admission medications   Medication Sig Start Date End Date Taking? Authorizing Provider  atorvastatin (LIPITOR) 40 MG tablet Take 40 mg by mouth at bedtime.     [provider]  Coenzyme Q10 (COQ10) 100 MG CAPS Take 100 mg by mouth every evening.    [provider]  doxazosin (CARDURA) 1 MG tablet Take 1 tablet (1 mg total) by mouth 2 (two) times daily. Needs office visit for further refills. Thank you! Patient taking differently: Take 1 mg by mouth daily. Pt taking once a day 06/12/19   Minna Merritts, MD  DULoxetine (CYMBALTA) 30 MG capsule Take 1 capsule (30 mg total) by mouth daily. Dose reduction 11/23/20   Ursula Alert, MD  gabapentin (NEURONTIN) 300 MG capsule Take 300 mg by mouth every morning. 09/01/19   [provider]  gabapentin (NEURONTIN) 600 MG tablet Take 1 tablet (600 mg total) by mouth as directed. TAKE HALF TABLET IN THE AM AND HALF TABLET AT LUNCH AND 1 TABLET( 600 MG ) AT BEDTIME Patient taking differently: Take 600 mg by mouth at bedtime. 12/23/18   Ursula Alert, MD  hydrochlorothiazide (HYDRODIURIL) 25 MG tablet Take 25 mg by mouth daily.  10/12/17    [provider]  hydrOXYzine (VISTARIL) 50 MG capsule Take 1 capsule (50 mg total) by mouth daily as needed. For sleep, anxiety 01/24/21   Ursula Alert, MD  levothyroxine (SYNTHROID) 75 MCG tablet Take 75 mcg by mouth every other day. Alternating with 50 mcg every other day-AM 04/30/19   [provider]  levothyroxine (SYNTHROID, LEVOTHROID) 50 MCG tablet Take 50 mcg by mouth every other day. Alternating with 75 mcg every other day-AM 11/07/17   [provider]  losartan (COZAAR) 100 MG tablet Take 100 mg by mouth daily. for high blood pressure 09/04/18   [provider]  methocarbamol (ROBAXIN) 500 MG tablet Take 250-500 mg by mouth at bedtime as needed. 10/28/20   [provider]  metoprolol (LOPRESSOR) 50 MG tablet Take 1 tablet (50 mg total) by mouth 2 (two) times daily. 03/16/16   Gregor Hams, MD  mirtazapine (REMERON) 15 MG tablet TAKE 1 TABLET (15 MG TOTAL) BY MOUTH AT BEDTIME. FOR SLEEP 01/09/21   Ursula Alert, MD  Multiple Vitamin (MULTIVITAMIN WITH MINERALS) TABS tablet Take 1 tablet by mouth daily.    [provider]  NONFORMULARY OR COMPOUNDED ITEM 1 Device by Other route as directed. CPAP-with the pressure setting of 10 cm H20 with heated humidity and ResMed AirFit F20 Full Face Mask , size small. 11/11/20   Ursula Alert, MD  Omega-3 Fatty Acids (FISH OIL PO) Take  1 capsule by mouth daily.    [provider]  omeprazole (PRILOSEC) 20 MG capsule Take 20 mg by mouth every morning.    [provider]      Allergies    Tape    Review of Systems   Review of Systems  Physical Exam Updated Vital Signs BP (!) 179/105    Pulse 81    Resp 16    LMP 02/05/2016 (Approximate)    SpO2 97%  Physical Exam Constitutional:      Appearance: She is well-developed.  HENT:     Head: Normocephalic and atraumatic.  Eyes:     Conjunctiva/sclera: Conjunctivae normal.  Cardiovascular:     Rate and Rhythm: Normal rate.   Pulmonary:     Effort: Pulmonary effort is normal. No respiratory distress.  Musculoskeletal:        General: Normal range of motion.     Cervical back: Normal range of motion.     Comments: Right thumb with normal active range of motion of the interphalangeal joint with flexion and extension.  No visible or palpable foreign body.  Wound with a small flap along the volar aspect of thumb interphalangeal joint, flap is opened and wound visualized in a bloodless field, no tendon visualization or foreign body visualization or palpation.  Skin:    General: Skin is warm.     Findings: No rash.  Neurological:     General: No focal deficit present.     Mental Status: She is alert and oriented to person, place, and time.  Psychiatric:        Behavior: Behavior normal.        Thought Content: Thought content normal.     ED Results / Procedures / Treatments   Labs (all labs ordered are listed, but only abnormal results are displayed) Labs Reviewed - No data to display  EKG None  Radiology No results found.  Procedures .Marland KitchenLaceration Repair  Date/Time: 01/30/2021 10:25 PM Performed by: Duanne Guess, PA-C Authorized by: Duanne Guess, PA-C   Consent:    Consent obtained:  Verbal   Consent given by:  Patient   Alternatives discussed:  No treatment Anesthesia:    Anesthesia method:  Nerve block   Block needle gauge:  25 G   Block anesthetic:  Lidocaine 1% w/o epi   Block technique:  Digital block left thumb   Block injection procedure:  Anatomic landmarks identified, introduced needle, incremental injection and negative aspiration for blood   Block outcome:  Anesthesia achieved Laceration details:    Location:  Finger   Finger location:  R thumb   Length (cm):  2 Pre-procedure details:    Preparation:  Patient was prepped and draped in usual sterile fashion Exploration:    Wound exploration: wound explored through full range of motion and entire depth of wound visualized      Wound extent: no tendon damage noted, no underlying fracture noted and no vascular damage noted     Contaminated: no   Treatment:    Area cleansed with:  Povidone-iodine and saline   Amount of cleaning:  Standard   Debridement:  None Skin repair:    Repair method:  Sutures   Suture size:  5-0   Suture material:  Nylon   Suture technique:  Simple interrupted   Number of sutures:  3 Approximation:    Approximation:  Close Repair type:    Repair type:  Simple Post-procedure details:    Dressing:  Bulky dressing   Procedure completion:  Tolerated well, no immediate complications    Medications Ordered in ED Medications  lidocaine (PF) (XYLOCAINE) 1 % injection 5 mL (has no administration in time range)    ED Course/ Medical Decision Making/ A&P                           Medical Decision Making Risk Prescription drug management.   61 year old female with laceration to the right thumb.  No tendon or neurological deficits.  No visible palpable foreign bodies.  Laceration was thoroughly irrigated and cleansed after successful digital block.  Flap was tacked down with three 5-0 nylon sutures.  Bleeding well controlled and dressing applied.  Patient's tetanus up-to-date.  Patient educated on wound care and signs and symptoms to return to the ER for.  Patient will follow-up in 8 to 10 days for suture removal Final Clinical Impression(s) / ED Diagnoses Final diagnoses:  None    Rx / DC Orders ED Discharge Orders     None         Renata Caprice 01/30/21 2227    Vladimir Crofts, MD 01/30/21 2241

## 2021-01-30 NOTE — ED Triage Notes (Signed)
Pt presents with laceration/abrasion to right thumb. Reports unable to control bleeding. Bleeding controled at this time.

## 2021-01-30 NOTE — Discharge Instructions (Signed)
Please keep bulky dressing over the laceration site for 24 hours then you may remove dressing and shower.  Do not submerge wound under dirty water.  Cover laceration daily with Band-Aid.  Follow-up with primary care provider, walk-in clinic in 8 to 10 days for suture removal.  Return to the ER for any increased pain swelling warmth redness or fevers.

## 2021-02-08 ENCOUNTER — Encounter (HOSPITAL_BASED_OUTPATIENT_CLINIC_OR_DEPARTMENT_OTHER): Payer: Self-pay | Admitting: Plastic Surgery

## 2021-02-08 ENCOUNTER — Encounter: Payer: Self-pay | Admitting: Surgical

## 2021-02-08 ENCOUNTER — Ambulatory Visit (INDEPENDENT_AMBULATORY_CARE_PROVIDER_SITE_OTHER): Payer: Medicare Other | Admitting: Surgical

## 2021-02-08 ENCOUNTER — Other Ambulatory Visit: Payer: Self-pay

## 2021-02-08 ENCOUNTER — Telehealth: Payer: Self-pay

## 2021-02-08 ENCOUNTER — Encounter (HOSPITAL_BASED_OUTPATIENT_CLINIC_OR_DEPARTMENT_OTHER)
Admission: RE | Admit: 2021-02-08 | Discharge: 2021-02-08 | Disposition: A | Discharge status: Home / Self Care | Payer: Medicare Other | Source: Ambulatory Visit | Attending: Plastic Surgery | Admitting: Plastic Surgery

## 2021-02-08 VITALS — HR 89 | Ht 66.25 in | Wt 206.0 lb

## 2021-02-08 DIAGNOSIS — Z9012 Acquired absence of left breast and nipple: Secondary | ICD-10-CM

## 2021-02-08 DIAGNOSIS — Z170 Estrogen receptor positive status [ER+]: Secondary | ICD-10-CM

## 2021-02-08 DIAGNOSIS — Z01812 Encounter for preprocedural laboratory examination: Secondary | ICD-10-CM | POA: Insufficient documentation

## 2021-02-08 DIAGNOSIS — Z79899 Other long term (current) drug therapy: Secondary | ICD-10-CM | POA: Insufficient documentation

## 2021-02-08 DIAGNOSIS — Z9889 Other specified postprocedural states: Secondary | ICD-10-CM

## 2021-02-08 DIAGNOSIS — C50512 Malignant neoplasm of lower-outer quadrant of left female breast: Secondary | ICD-10-CM

## 2021-02-08 DIAGNOSIS — N651 Disproportion of reconstructed breast: Secondary | ICD-10-CM

## 2021-02-08 LAB — BASIC METABOLIC PANEL
Anion gap: 7 (ref 5–15)
BUN: 12 mg/dL (ref 8–23)
CO2: 29 mmol/L (ref 22–32)
Calcium: 9.7 mg/dL (ref 8.9–10.3)
Chloride: 101 mmol/L (ref 98–111)
Creatinine, Ser: 0.94 mg/dL (ref 0.44–1.00)
GFR, Estimated: >60 mL/min (ref >60)
Glucose, Bld: 124 mg/dL — ABNORMAL HIGH (ref 70–99)
Potassium: 4.1 mmol/L (ref 3.5–5.1)
Sodium: 137 mmol/L (ref 135–145)

## 2021-02-08 MED ORDER — OXYCODONE HCL 5 MG PO TABS
5.0000 mg | ORAL_TABLET | Freq: Four times a day (QID) | ORAL | 0 refills | Status: AC | PRN
Start: 2021-02-08 — End: 2021-02-13

## 2021-02-08 MED ORDER — CEPHALEXIN 500 MG PO CAPS: 500.0000 mg | ORAL_CAPSULE | Freq: Four times a day (QID) | ORAL | 0 refills | Status: AC

## 2021-02-08 MED ORDER — ONDANSETRON HCL 4 MG PO TABS: 4.0000 mg | ORAL_TABLET | Freq: Three times a day (TID) | ORAL | 0 refills | Status: DC | PRN
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
## 2021-02-08 NOTE — Telephone Encounter (Signed)
Patient called to say she was just see by [facility] today.  She asked that we please let him know that they don't have the oxycodone like he had said that they were out [facility] at [facility].  They are going to fill it when they get there.  She asked that we please call in some Diflucan, because if they put her on an antibiotic next week for her surgery, she said she always gets a yeast infection.  She asked that we please call in one pill of Diflucan.

## 2021-02-08 NOTE — H&P (View-Only) (Signed)
Patient ID: Kelli Logan, female    DOB: September 19, 1960, 61 y.o.   MRN: [id number]  Chief Complaint  Patient presents with   Pre-op Exam      ICD-10-CM   1. S/P breast reconstruction, left  Z98.890     2. Status post left mastectomy  Z90.12     3. Breast asymmetry following reconstructive surgery  N65.1     4. Malignant neoplasm of lower-outer quadrant of left breast of female, estrogen receptor positive ([place])  C50.512    Z17.0        History of Present Illness: Kelli Logan is a 61 y.o.  female  with a history of breast reconstruction.  She presents for preoperative evaluation for upcoming procedure, placement of right breast implant, liposuction with lipo filling of left lateral breast., scheduled for 02/16/2021 with Dr. Marla Roe.  The patient has not had problems with anesthesia. No history of DVT/PE.  No family history of DVT/PE.  No family or personal history of bleeding or clotting disorders.  Patient is not currently taking any blood thinners.  No history of CVA/MI.   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  In April 2021 she underwent left breast capsular release with fat grafting.  She is bothered by asymmetry in her bra, feels as if she needs a little bit more upper pole fullness in the right breast.  She has tried sizers on feels like 150 cc would work well for her.  PMH Significant for: OSA, not currently on CPAP but does have an order for this.  She reports that she is going to fill this soon when she has the findings.  History of hypertension, fibromyalgia, GERD.  She reports she is overall feeling well lately, no recent changes in her health. She reports she is going to hold her fish oil and other supplements prior to surgery.  She confirms  today that she likes the 150 cc implant and feels that that is a good size for her, she is understanding that if we need to go bigger intraoperatively or smaller depending on the appearance that she has okay with that.  Past Medical History: Allergies: Allergies  Allergen Reactions   Tape Other (See Comments)    Paper tape after breast surgery/Burned skin   Tegaderm and other tape OK    Current Medications:  Current Outpatient Medications:    atorvastatin (LIPITOR) 40 MG tablet, Take 40 mg by mouth at bedtime. , Disp: , Rfl:    cephALEXin (KEFLEX) 500 MG capsule, Take 1 capsule (500 mg total) by mouth 4 (four) times daily for 5 days., Disp: 20 capsule, Rfl: 0   Coenzyme Q10 (COQ10) 100 MG CAPS, Take 100 mg by mouth every evening., Disp: , Rfl:    doxazosin (CARDURA) 1 MG tablet, Take 1 tablet (1 mg total) by mouth 2 (two) times daily. Needs office visit for further refills. Thank you! (Patient taking differently: Take 1 mg by mouth daily. Pt taking once a day), Disp: 60 tablet, Rfl: 0   DULoxetine (CYMBALTA) 30 MG capsule, Take 1 capsule (30 mg total) by mouth daily. Dose reduction, Disp: 90 capsule, Rfl: 0   hydrochlorothiazide (HYDRODIURIL) 25 MG tablet, Take 25 mg by mouth daily. , Disp: , Rfl:    hydrOXYzine (VISTARIL)  50 MG capsule, Take 1 capsule (50 mg total) by mouth daily as needed. For sleep, anxiety, Disp: 90 capsule, Rfl: 1   levothyroxine (SYNTHROID) 75 MCG tablet, Take 75 mcg by mouth every other day. Alternating with 50 mcg every other day-AM, Disp: , Rfl:    levothyroxine (SYNTHROID, LEVOTHROID) 50 MCG tablet, Take 50 mcg by mouth every other day. Alternating with 75 mcg every other day-AM, Disp: , Rfl:    losartan (COZAAR) 100 MG tablet, Take 100 mg by mouth daily. for high blood pressure, Disp: , Rfl:    methocarbamol (ROBAXIN) 500 MG tablet, Take 250-500 mg by mouth at bedtime as needed., Disp: , Rfl:    metoprolol (LOPRESSOR) 50 MG tablet, Take 1 tablet (50 mg total) by  mouth 2 (two) times daily., Disp: 60 tablet, Rfl: 0   mirtazapine (REMERON) 15 MG tablet, TAKE 1 TABLET (15 MG TOTAL) BY MOUTH AT BEDTIME. FOR SLEEP, Disp: 90 tablet, Rfl: 0   Multiple Vitamin (MULTIVITAMIN WITH MINERALS) TABS tablet, Take 1 tablet by mouth daily., Disp: , Rfl:    NONFORMULARY OR COMPOUNDED ITEM, 1 Device by Other route as directed. CPAP-with the pressure setting of 10 cm H20 with heated humidity and ResMed AirFit F20 Full Face Mask , size small., Disp: 1 each, Rfl: 0   Omega-3 Fatty Acids (FISH OIL PO), Take 1 capsule by mouth daily., Disp: , Rfl:    omeprazole (PRILOSEC) 20 MG capsule, Take 20 mg by mouth every morning., Disp: , Rfl:    ondansetron (ZOFRAN) 4 MG tablet, Take 1 tablet (4 mg total) by mouth every 8 (eight) hours as needed for nausea or vomiting., Disp: 20 tablet, Rfl: 0   oxyCODONE (OXY IR/ROXICODONE) 5 MG immediate release tablet, Take 1 tablet (5 mg total) by mouth every 6 (six) hours as needed for up to 5 days for severe pain., Disp: 20 tablet, Rfl: 0  Past Medical Problems: Past Medical History:  Diagnosis Date   Anxiety    Arthritis    Cancer ([place])    breast left   Depression    Fibromyalgia    GERD (gastroesophageal reflux disease)    Headache    History of kidney stones    h/o   Hypertension    Hypothyroidism    Sleep apnea    DOES NOT USE CPAP   Thyroid disease     Past Surgical History: Past Surgical History:  Procedure Laterality Date   APPENDECTOMY     BREAST BIOPSY Left few yrs ago   stereotactic bx in [place], benign   BREAST BIOPSY Left 12/26/2017   Affirm Bx IMC- X-Clip   BREAST BIOPSY Left 01/14/2018   Affirm Bx- Coil clip- path pending   BREAST RECONSTRUCTION WITH PLACEMENT OF TISSUE EXPANDER AND ALLODERM Left 04/08/2018   Procedure: LEFT BREAST IMMEDIATE  RECONSTRUCTION WITH EXPANDER AND FLEX HD;  Surgeon: Wallace Going, DO;  Location: [facility];  Service: Plastics;  Laterality: Left;   COLONOSCOPY WITH PROPOFOL N/A  06/26/2018   Procedure: COLONOSCOPY WITH PROPOFOL;  Surgeon: Virgel Manifold, MD;  Location: [facility];  Service: Endoscopy;  Laterality: N/A;   ESOPHAGOGASTRODUODENOSCOPY (EGD) WITH PROPOFOL N/A 06/26/2018   Procedure: ESOPHAGOGASTRODUODENOSCOPY (EGD) WITH PROPOFOL;  Surgeon: Virgel Manifold, MD;  Location: [facility];  Service: Endoscopy;  Laterality: N/A;   EYE SURGERY Bilateral    lasik   FOOT BONE EXCISION     IMAGE GUIDED SINUS SURGERY  10/220   LIPOSUCTION WITH LIPOFILLING Left 04/30/2019  Procedure: Fat grafting to left breast;  Surgeon: Wallace Going, DO;  Location: [place];  Service: Plastics;  Laterality: Left;  90 min, please   MASTECTOMY Left 04/08/2019   MASTECTOMY W/ SENTINEL NODE BIOPSY Left 04/08/2018   Procedure: MASTECTOMY WITH SENTINEL LYMPH NODE BIOPSY LEFT;  Surgeon: Fredirick Maudlin, MD;  Location: [facility];  Service: General;  Laterality: Left;   MASTOPEXY Right 08/04/2018   Procedure: MASTOPEXY;  Surgeon: Wallace Going, DO;  Location: [facility];  Service: Plastics;  Laterality: Right;  TOTAL CASE TIME SHOULD BE 3 HOURS, PLEASE   REMOVAL OF TISSUE EXPANDER AND PLACEMENT OF IMPLANT Left 08/04/2018   Procedure: REMOVAL OF TISSUE EXPANDER AND PLACEMENT OF IMPLANT;  Surgeon: Wallace Going, DO;  Location: [facility];  Service: Plastics;  Laterality: Left;   SCAR REVISION Left 04/30/2019   Procedure: release of scar contracture to left breast;  Surgeon: Wallace Going, DO;  Location: [place];  Service: Plastics;  Laterality: Left;   TOTAL HIP ARTHROPLASTY Left 02/11/2020   Procedure: TOTAL HIP ARTHROPLASTY ANTERIOR APPROACH;  Surgeon: Hessie Knows, MD;  Location: [facility];  Service: Orthopedics;  Laterality: Left;   UMBILICAL HERNIA REPAIR  1983 and 1985    Social History: Social History   Socioeconomic History   Marital status: Divorced    Spouse name: Not on file   Number of children: 1   Years  of education: Not on file   Highest education level: High school graduate  Occupational History   Not on file  Tobacco Use   Smoking status: Former    Packs/day: 1.00    Years: 2.00    Pack years: 2.00    Types: Cigarettes    Quit date: 01/06/2017    Years since quitting: 4.0   Smokeless tobacco: Never  Vaping Use   Vaping Use: Former   Quit date: 11/27/2016  Substance and Sexual Activity   Alcohol use: Yes    Comment: rare beer    Drug use: Not Currently   Sexual activity: Not on file  Other Topics Concern   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 Quit smoking; ocassional alcohol.    Social Determinants of Health   Financial Resource Strain: Not on file  Food Insecurity: Not on file  Transportation Needs: Not on file  Physical Activity: Not on file  Stress: Not on file  Social Connections: Not on file  Intimate Partner Violence: Not on file    Family History: Family History  Problem Relation Age of Onset   Hypertension Mother    Lung cancer Maternal Grandmother    Heart disease Father    Alcohol abuse Father    Breast cancer Neg Hx    Colon cancer Neg Hx     Review of Systems: Review of Systems  Constitutional: Negative.   Respiratory: Negative.    Cardiovascular: Negative.    Physical Exam: Vital Signs Pulse 89    Ht 5' 6.25" (1.683 m)    Wt 206 lb (93.4 kg)    LMP 02/05/2016 (Approximate)    SpO2 98%    BMI 33.00 kg/m   Physical Exam  Constitutional:      General: Not in acute distress.    Appearance: Normal appearance. Not ill-appearing.  HENT:     Head: Normocephalic and atraumatic.  Eyes:     Pupils: Pupils  are equal, round Neck:     Musculoskeletal: Normal range of motion.  Cardiovascular:     Rate and Rhythm: Normal rate    Pulses: Normal pulses.  Pulmonary:     Effort: Pulmonary effort is normal. No respiratory  distress.  Musculoskeletal: Normal range of motion.  Skin:    General: Skin is warm and dry.     Findings: No erythema or rash.  Neurological:     General: No focal deficit present.     Mental Status: Alert and oriented to person, place, and time. Mental status is at baseline.     Motor: No weakness.  Psychiatric:        Mood and Affect: Mood normal.        Behavior: Behavior normal.    Assessment/Plan: The patient is scheduled for placement of right breast implant and liposuction with lipo filling of left lateral breast with Dr. Marla Roe.  Risks, benefits, and alternatives of procedure discussed, questions answered and consent obtained.    Smoking Status: Non-smoker; Counseling Given?  N/A Last Mammogram: Right breast: 10/22/2018; Results: Negative  Caprini Score: 7, high; Risk Factors include: age, BMI > 25, hx of breast cancer and length of planned surgery. Recommendation for mechanical  prophylaxis. Encourage early ambulation.   Pictures obtained: 01/05/2021  Post-op Rx sent to pharmacy:  Oxycodone, Zofran, Keflex  Patient was provided with the General Surgical Risk consent document and Pain Medication Agreement prior to their appointment.  They had adequate time to read through the risk consent documents and Pain Medication Agreement. We also discussed them in person together during this preop appointment. All of their questions were answered to their satisfaction.  Recommended calling if they have any further questions.  Risk consent form and Pain Medication Agreement to be scanned into patient's chart.  The risks that can be encountered with and after placement of a breast implant were discussed and include the following but not limited to these: bleeding, infection, delayed healing, anesthesia risks, skin sensation changes, injury to structures including nerves, blood vessels, and muscles which may be temporary or permanent, allergies to tape, suture materials and glues, blood  products, topical preparations or injected agents, skin contour irregularities, skin discoloration and swelling, deep vein thrombosis, cardiac and pulmonary complications, pain, which may persist, fluid accumulation, wrinkling of the skin over the implanmt, changes in nipple or breast sensation, implant leakage or rupture, faulty position of the implant, persistent pain, formation of tight scar tissue around the implant (capsular contracture).  Patient was provided with the Mentor implant patient decision checklist and this was completed during today's preoperative evaluation. Patient had time to read through the information and any questions were answered to their content. Form will be scanned into patient's chart.  The risks that can be encountered with and after liposuction were discussed and include the following but no limited to these:  Asymmetry, fluid accumulation, firmness of the area, fat necrosis with death of fat tissue, bleeding, infection, delayed healing, anesthesia risks, skin sensation changes, injury to structures including nerves, blood vessels, and muscles which may be temporary or permanent, allergies to tape, suture materials and glues, blood products, topical preparations or injected agents, skin and contour irregularities, skin discoloration and swelling, deep vein thrombosis, cardiac and pulmonary complications, pain, which may persist, persistent pain, recurrence of the lesion, poor healing of the incision, possible need for revisional surgery or staged procedures. Thiere can also be persistent swelling, poor wound healing, rippling or loose skin, worsening of cellulite,  swelling, and thermal burn or heat injury from ultrasound with the ultrasound-assisted lipoplasty technique. Any change in weight fluctuations can alter the outcome.   Electronically signed by: Carola Rhine Keene Gilkey, PA-C 02/08/2021 2:02 PM

## 2021-02-10 ENCOUNTER — Telehealth: Payer: Self-pay

## 2021-02-10 ENCOUNTER — Telehealth: Payer: Self-pay | Admitting: *Deleted

## 2021-02-10 NOTE — Telephone Encounter (Signed)
Patient called to say she saw Roetta Sessions, PA-C a few days ago and he called in her prescriptions for her surgery.  She said she left a message the next day to request that we call in Diflucan for her since she will be on antibiotics for her surgery and these usually give her a yeast infection.  Patient asked that we please [facility] [place]

## 2021-02-10 NOTE — Telephone Encounter (Signed)
Patient left vm on triage. She declines any further follow-up apts with Dr. Rogue Bussing in the cancer center. She request that her apt on 10/25 to be cnl. "I am cured from my cancer and no longer need to follow-up at the cancer center." Apt cnl per her request

## 2021-02-13 MED ORDER — FLUCONAZOLE 150 MG PO TABS: 150.0000 mg | ORAL_TABLET | Freq: Once | ORAL | 0 refills | Status: AC

## 2021-02-13 NOTE — Telephone Encounter (Signed)
Spoke with patient and advised Diflucan was sent to her pharmacy this morning.

## 2021-02-16 ENCOUNTER — Other Ambulatory Visit: Payer: Self-pay

## 2021-02-16 ENCOUNTER — Encounter (HOSPITAL_BASED_OUTPATIENT_CLINIC_OR_DEPARTMENT_OTHER)
Admission: RE | Disposition: A | Discharge status: Home / Self Care | Payer: Self-pay | Source: Home / Self Care | Attending: Plastic Surgery

## 2021-02-16 ENCOUNTER — Ambulatory Visit (HOSPITAL_BASED_OUTPATIENT_CLINIC_OR_DEPARTMENT_OTHER)
Admission: RE | Admit: 2021-02-16 | Discharge: 2021-02-16 | Disposition: A | Discharge status: Home / Self Care | Payer: Medicare Other | Attending: Plastic Surgery | Admitting: Plastic Surgery

## 2021-02-16 ENCOUNTER — Encounter (HOSPITAL_BASED_OUTPATIENT_CLINIC_OR_DEPARTMENT_OTHER): Payer: Self-pay | Admitting: Plastic Surgery

## 2021-02-16 ENCOUNTER — Ambulatory Visit (HOSPITAL_BASED_OUTPATIENT_CLINIC_OR_DEPARTMENT_OTHER): Payer: Medicare Other | Admitting: Anesthesiology

## 2021-02-16 DIAGNOSIS — I10 Essential (primary) hypertension: Secondary | ICD-10-CM | POA: Insufficient documentation

## 2021-02-16 DIAGNOSIS — G4733 Obstructive sleep apnea (adult) (pediatric): Secondary | ICD-10-CM | POA: Insufficient documentation

## 2021-02-16 DIAGNOSIS — Z9011 Acquired absence of right breast and nipple: Secondary | ICD-10-CM | POA: Insufficient documentation

## 2021-02-16 DIAGNOSIS — Z87891 Personal history of nicotine dependence: Secondary | ICD-10-CM | POA: Insufficient documentation

## 2021-02-16 DIAGNOSIS — N6489 Other specified disorders of breast: Secondary | ICD-10-CM

## 2021-02-16 DIAGNOSIS — K219 Gastro-esophageal reflux disease without esophagitis: Secondary | ICD-10-CM | POA: Insufficient documentation

## 2021-02-16 DIAGNOSIS — Z853 Personal history of malignant neoplasm of breast: Secondary | ICD-10-CM | POA: Insufficient documentation

## 2021-02-16 DIAGNOSIS — N651 Disproportion of reconstructed breast: Secondary | ICD-10-CM

## 2021-02-16 DIAGNOSIS — Z9012 Acquired absence of left breast and nipple: Secondary | ICD-10-CM

## 2021-02-16 DIAGNOSIS — Z79899 Other long term (current) drug therapy: Secondary | ICD-10-CM

## 2021-02-16 HISTORY — PX: BREAST IMPLANT EXCHANGE: SHX6296

## 2021-02-16 HISTORY — PX: LIPOSUCTION: SHX10

## 2021-02-16 SURGERY — REPLACEMENT, IMPLANT, BREAST
Anesthesia: General | Site: Breast | Laterality: Right

## 2021-02-16 MED ORDER — CHLORHEXIDINE GLUCONATE CLOTH 2 % EX PADS: 6.0000 | MEDICATED_PAD | Freq: Once | CUTANEOUS | Status: DC

## 2021-02-16 MED ORDER — SCOPOLAMINE 1 MG/3DAYS TD PT72
MEDICATED_PATCH | TRANSDERMAL | Status: AC
  Filled 2021-02-16: qty 1

## 2021-02-16 MED ORDER — SCOPOLAMINE 1 MG/3DAYS TD PT72
1.0000 | MEDICATED_PATCH | TRANSDERMAL | Status: DC
  Administered 2021-02-16: 1.5 mg via TRANSDERMAL

## 2021-02-16 MED ORDER — PHENYLEPHRINE 40 MCG/ML (10ML) SYRINGE FOR IV PUSH (FOR BLOOD PRESSURE SUPPORT)
PREFILLED_SYRINGE | INTRAVENOUS | Status: DC | PRN
  Administered 2021-02-16: 120 ug via INTRAVENOUS

## 2021-02-16 MED ORDER — GLYCOPYRROLATE PF 0.2 MG/ML IJ SOSY
PREFILLED_SYRINGE | INTRAMUSCULAR | Status: AC
  Filled 2021-02-16: qty 1

## 2021-02-16 MED ORDER — CELECOXIB 200 MG PO CAPS
ORAL_CAPSULE | ORAL | Status: AC
  Filled 2021-02-16: qty 1

## 2021-02-16 MED ORDER — OXYCODONE HCL 5 MG PO TABS
5.0000 mg | ORAL_TABLET | Freq: Once | ORAL | Status: AC
  Administered 2021-02-16: 5 mg via ORAL

## 2021-02-16 MED ORDER — SODIUM CHLORIDE 0.9 % IV SOLN
INTRAVENOUS | Status: AC
  Filled 2021-02-16: qty 10

## 2021-02-16 MED ORDER — PROPOFOL 10 MG/ML IV BOLUS
INTRAVENOUS | Status: AC
  Filled 2021-02-16: qty 20

## 2021-02-16 MED ORDER — FENTANYL CITRATE (PF) 100 MCG/2ML IJ SOLN
INTRAMUSCULAR | Status: DC | PRN
Start: 2021-02-16 — End: 2021-02-16
  Administered 2021-02-16: 75 ug via INTRAVENOUS

## 2021-02-16 MED ORDER — ONDANSETRON HCL 4 MG/2ML IJ SOLN
INTRAMUSCULAR | Status: AC
  Filled 2021-02-16: qty 2

## 2021-02-16 MED ORDER — LIDOCAINE-EPINEPHRINE 1 %-1:100000 IJ SOLN
INTRAMUSCULAR | Status: DC | PRN
  Administered 2021-02-16: 10 mL

## 2021-02-16 MED ORDER — CEFAZOLIN SODIUM-DEXTROSE 2-4 GM/100ML-% IV SOLN
2.0000 g | INTRAVENOUS | Status: AC
  Administered 2021-02-16: 2 g via INTRAVENOUS

## 2021-02-16 MED ORDER — FENTANYL CITRATE (PF) 100 MCG/2ML IJ SOLN: 25.0000 ug | INTRAMUSCULAR | Status: DC | PRN

## 2021-02-16 MED ORDER — DEXAMETHASONE SODIUM PHOSPHATE 4 MG/ML IJ SOLN
INTRAMUSCULAR | Status: DC | PRN
Start: 2021-02-16 — End: 2021-02-16
  Administered 2021-02-16: 10 mg via INTRAVENOUS

## 2021-02-16 MED ORDER — PROPOFOL 10 MG/ML IV BOLUS
INTRAVENOUS | Status: DC | PRN
Start: 2021-02-16 — End: 2021-02-16
  Administered 2021-02-16: 200 mg via INTRAVENOUS

## 2021-02-16 MED ORDER — LIDOCAINE 2% (20 MG/ML) 5 ML SYRINGE
INTRAMUSCULAR | Status: DC | PRN
  Administered 2021-02-16: 100 mg via INTRAVENOUS

## 2021-02-16 MED ORDER — PROMETHAZINE HCL 25 MG/ML IJ SOLN: 6.2500 mg | INTRAMUSCULAR | Status: DC | PRN

## 2021-02-16 MED ORDER — OXYCODONE HCL 5 MG PO TABS
ORAL_TABLET | ORAL | Status: AC
  Filled 2021-02-16: qty 1

## 2021-02-16 MED ORDER — AMISULPRIDE (ANTIEMETIC) 5 MG/2ML IV SOLN: 10.0000 mg | Freq: Once | INTRAVENOUS | Status: DC | PRN

## 2021-02-16 MED ORDER — ACETAMINOPHEN 500 MG PO TABS
ORAL_TABLET | ORAL | Status: AC
  Filled 2021-02-16: qty 2

## 2021-02-16 MED ORDER — MIDAZOLAM HCL 2 MG/2ML IJ SOLN
INTRAMUSCULAR | Status: AC
  Filled 2021-02-16: qty 2

## 2021-02-16 MED ORDER — CELECOXIB 200 MG PO CAPS
200.0000 mg | ORAL_CAPSULE | Freq: Once | ORAL | Status: AC
  Administered 2021-02-16: 200 mg via ORAL

## 2021-02-16 MED ORDER — LACTATED RINGERS IV SOLN: INTRAVENOUS | Status: DC

## 2021-02-16 MED ORDER — ONDANSETRON HCL 4 MG/2ML IJ SOLN
INTRAMUSCULAR | Status: DC | PRN
Start: 2021-02-16 — End: 2021-02-16
  Administered 2021-02-16: 4 mg via INTRAVENOUS

## 2021-02-16 MED ORDER — SODIUM CHLORIDE 0.9 % IV SOLN
INTRAVENOUS | Status: DC | PRN
  Administered 2021-02-16: 500 mL

## 2021-02-16 MED ORDER — CEFAZOLIN SODIUM-DEXTROSE 2-4 GM/100ML-% IV SOLN
INTRAVENOUS | Status: AC
  Filled 2021-02-16: qty 100

## 2021-02-16 MED ORDER — LIDOCAINE HCL 1 % IJ SOLN
INTRAVENOUS | Status: DC | PRN
  Administered 2021-02-16: 100 mL

## 2021-02-16 MED ORDER — PHENYLEPHRINE HCL (PRESSORS) 10 MG/ML IV SOLN
INTRAVENOUS | Status: AC
  Filled 2021-02-16: qty 1

## 2021-02-16 MED ORDER — PHENYLEPHRINE 40 MCG/ML (10ML) SYRINGE FOR IV PUSH (FOR BLOOD PRESSURE SUPPORT)
PREFILLED_SYRINGE | INTRAVENOUS | Status: AC
  Filled 2021-02-16: qty 10

## 2021-02-16 MED ORDER — MIDAZOLAM HCL 5 MG/5ML IJ SOLN
INTRAMUSCULAR | Status: DC | PRN
  Administered 2021-02-16: 2 mg via INTRAVENOUS

## 2021-02-16 MED ORDER — ACETAMINOPHEN 500 MG PO TABS
1000.0000 mg | ORAL_TABLET | Freq: Once | ORAL | Status: AC
  Administered 2021-02-16: 1000 mg via ORAL

## 2021-02-16 MED ORDER — FENTANYL CITRATE (PF) 100 MCG/2ML IJ SOLN
INTRAMUSCULAR | Status: AC
  Filled 2021-02-16: qty 2

## 2021-02-16 MED ORDER — DEXAMETHASONE SODIUM PHOSPHATE 10 MG/ML IJ SOLN
INTRAMUSCULAR | Status: AC
  Filled 2021-02-16: qty 1

## 2021-02-16 SURGICAL SUPPLY — 82 items
ADH SKN CLS APL DERMABOND .7 (GAUZE/BANDAGES/DRESSINGS) ×3
BAG DECANTER FOR FLEXI CONT (MISCELLANEOUS) ×4 IMPLANT
BINDER ABDOMINAL  9 SM 30-45 (SOFTGOODS)
BINDER ABDOMINAL 10 UNV 27-48 (MISCELLANEOUS) IMPLANT
BINDER ABDOMINAL 12 SM 30-45 (SOFTGOODS) IMPLANT
BINDER ABDOMINAL 9 SM 30-45 (SOFTGOODS) IMPLANT
BINDER BREAST LRG (GAUZE/BANDAGES/DRESSINGS) IMPLANT
BINDER BREAST MEDIUM (GAUZE/BANDAGES/DRESSINGS) IMPLANT
BINDER BREAST XLRG (GAUZE/BANDAGES/DRESSINGS) IMPLANT
BINDER BREAST XXLRG (GAUZE/BANDAGES/DRESSINGS) IMPLANT
BIOPATCH RED 1 DISK 7.0 (GAUZE/BANDAGES/DRESSINGS) IMPLANT
BLADE HEX COATED 2.75 (ELECTRODE) ×4 IMPLANT
BLADE SURG 15 STRL LF DISP TIS (BLADE) ×3 IMPLANT
BLADE SURG 15 STRL SS (BLADE) ×4
BNDG GAUZE ELAST 4 BULKY (GAUZE/BANDAGES/DRESSINGS) ×4 IMPLANT
CANISTER SUCT 1200ML W/VALVE (MISCELLANEOUS) ×4 IMPLANT
COVER BACK TABLE 60X90IN (DRAPES) ×4 IMPLANT
COVER MAYO STAND STRL (DRAPES) ×4 IMPLANT
DERMABOND ADVANCED (GAUZE/BANDAGES/DRESSINGS) ×1
DERMABOND ADVANCED .7 DNX12 (GAUZE/BANDAGES/DRESSINGS) ×3 IMPLANT
DRAIN CHANNEL 19F RND (DRAIN) IMPLANT
DRAPE LAPAROSCOPIC ABDOMINAL (DRAPES) ×4 IMPLANT
DRESSING MEPILEX FLEX 4X4 (GAUZE/BANDAGES/DRESSINGS) ×2 IMPLANT
DRSG MEPILEX FLEX 4X4 (GAUZE/BANDAGES/DRESSINGS) ×8
DRSG PAD ABDOMINAL 8X10 ST (GAUZE/BANDAGES/DRESSINGS) ×8 IMPLANT
DRSG TEGADERM 2-3/8X2-3/4 SM (GAUZE/BANDAGES/DRESSINGS) IMPLANT
ELECT BLADE 4.0 EZ CLEAN MEGAD (MISCELLANEOUS) ×4
ELECT REM PT RETURN 9FT ADLT (ELECTROSURGICAL) ×4
ELECTRODE BLDE 4.0 EZ CLN MEGD (MISCELLANEOUS) ×3 IMPLANT
ELECTRODE REM PT RTRN 9FT ADLT (ELECTROSURGICAL) ×3 IMPLANT
EVACUATOR SILICONE 100CC (DRAIN) IMPLANT
EXTRACTOR CANIST REVOLVE STRL (CANNISTER) ×2 IMPLANT
FUNNEL KELLER 2 DISP (MISCELLANEOUS) ×2 IMPLANT
GAUZE SPONGE 4X4 12PLY STRL LF (GAUZE/BANDAGES/DRESSINGS) IMPLANT
GLOVE SURG ENC MOIS LTX SZ6.5 (GLOVE) ×8 IMPLANT
GLOVE SURG POLYISO LF SZ6.5 (GLOVE) ×2 IMPLANT
GLOVE SURG POLYISO LF SZ7 (GLOVE) ×2 IMPLANT
GLOVE SURG UNDER POLY LF SZ6.5 (GLOVE) ×2 IMPLANT
GLOVE SURG UNDER POLY LF SZ7 (GLOVE) ×2 IMPLANT
GOWN STRL REUS W/ TWL LRG LVL3 (GOWN DISPOSABLE) ×8 IMPLANT
GOWN STRL REUS W/TWL LRG LVL3 (GOWN DISPOSABLE) ×16
IMPL GEL HP 150CC (Breast) ×1 IMPLANT
IMPLANT GEL HP 150CC (Breast) ×4 IMPLANT
IV LACTATED RINGERS 1000ML (IV SOLUTION) ×4 IMPLANT
IV NS 1000ML (IV SOLUTION)
IV NS 1000ML BAXH (IV SOLUTION) IMPLANT
IV NS 500ML (IV SOLUTION)
IV NS 500ML BAXH (IV SOLUTION) ×2 IMPLANT
KIT FILL SYSTEM UNIVERSAL (SET/KITS/TRAYS/PACK) IMPLANT
LINER CANISTER 1000CC FLEX (MISCELLANEOUS) ×4 IMPLANT
NDL HYPO 25X1 1.5 SAFETY (NEEDLE) IMPLANT
NDL SAFETY ECLIPSE 18X1.5 (NEEDLE) ×3 IMPLANT
NEEDLE HYPO 18GX1.5 SHARP (NEEDLE) ×4
NEEDLE HYPO 25X1 1.5 SAFETY (NEEDLE) ×4 IMPLANT
PACK BASIN DAY SURGERY FS (CUSTOM PROCEDURE TRAY) ×4 IMPLANT
PAD ALCOHOL SWAB (MISCELLANEOUS) ×4 IMPLANT
PENCIL SMOKE EVACUATOR (MISCELLANEOUS) ×4 IMPLANT
PIN SAFETY STERILE (MISCELLANEOUS) IMPLANT
SLEEVE SCD COMPRESS KNEE MED (STOCKING) ×4 IMPLANT
SPONGE T-LAP 18X18 ~~LOC~~+RFID (SPONGE) ×6 IMPLANT
STRIP SUTURE WOUND CLOSURE 1/2 (MISCELLANEOUS) IMPLANT
SUT MNCRL AB 4-0 PS2 18 (SUTURE) ×4 IMPLANT
SUT MON AB 3-0 SH 27 (SUTURE) ×4
SUT MON AB 3-0 SH27 (SUTURE) ×3 IMPLANT
SUT MON AB 5-0 PS2 18 (SUTURE) ×6 IMPLANT
SUT PDS AB 2-0 CT2 27 (SUTURE) IMPLANT
SUT VIC AB 3-0 SH 27 (SUTURE)
SUT VIC AB 3-0 SH 27X BRD (SUTURE) IMPLANT
SUT VICRYL 4-0 PS2 18IN ABS (SUTURE) IMPLANT
SYR 10ML LL (SYRINGE) ×16 IMPLANT
SYR 3ML 18GX1 1/2 (SYRINGE) IMPLANT
SYR 50ML LL SCALE MARK (SYRINGE) ×8 IMPLANT
SYR BULB IRRIG 60ML STRL (SYRINGE) ×4 IMPLANT
SYR CONTROL 10ML LL (SYRINGE) ×4 IMPLANT
SYR TOOMEY 50ML (SYRINGE) ×8 IMPLANT
TOWEL GREEN STERILE FF (TOWEL DISPOSABLE) ×8 IMPLANT
TRAY DSU PREP LF (CUSTOM PROCEDURE TRAY) ×4 IMPLANT
TUBE CONNECTING 20X1/4 (TUBING) ×4 IMPLANT
TUBING INFILTRATION IT-10001 (TUBING) ×2 IMPLANT
TUBING SET GRADUATE ASPIR 12FT (MISCELLANEOUS) ×4 IMPLANT
UNDERPAD 30X36 HEAVY ABSORB (UNDERPADS AND DIAPERS) ×8 IMPLANT
YANKAUER SUCT BULB TIP NO VENT (SUCTIONS) ×4 IMPLANT

## 2021-02-16 NOTE — Anesthesia Preprocedure Evaluation (Addendum)
Anesthesia Evaluation  Patient identified by MRN, date of birth, ID band Patient awake    Reviewed: Allergy & Precautions, NPO status , Patient's Chart, lab work & pertinent test results  History of Anesthesia Complications Negative for: history of anesthetic complications  Airway Mallampati: II  TM Distance: >3 FB Neck ROM: Full    Dental no notable dental hx. (+) Dental Advisory Given   Pulmonary sleep apnea , Patient abstained from smoking., former smoker,    Pulmonary exam normal        Cardiovascular Exercise Tolerance: Good hypertension, Pt. on medications Normal cardiovascular exam     Neuro/Psych  Headaches, PSYCHIATRIC DISORDERS Anxiety Depression Bipolar Disorder    GI/Hepatic Neg liver ROS, hiatal hernia, GERD  Medicated and Controlled,  Endo/Other  Hypothyroidism   Renal/GU negative Renal ROS  negative genitourinary   Musculoskeletal  (+) Arthritis , Osteoarthritis,  Fibromyalgia -  Abdominal   Peds negative pediatric ROS (+)  Hematology negative hematology ROS (+)   Anesthesia Other Findings Past Medical History: No date: Anxiety No date: Arthritis No date: Cancer [facility])     Comment:  breast left No date: Depression No date: Fibromyalgia No date: GERD (gastroesophageal reflux disease) No date: Headache No date: History of kidney stones     Comment:  h/o No date: Hypertension No date: Hypothyroidism No date: Sleep apnea     Comment:  DOES NOT USE CPAP No date: Thyroid disease  Reproductive/Obstetrics negative OB ROS                            Anesthesia Physical  Anesthesia Plan  ASA: 2  Anesthesia Plan: General   Post-op Pain Management: Celebrex PO (pre-op) and Tylenol PO (pre-op)   Induction: Intravenous  PONV Risk Score and Plan: 3 and Ondansetron, Dexamethasone and Midazolam  Airway Management Planned: LMA  Additional Equipment:   Intra-op Plan:    Post-operative Plan: Extubation in OR  Informed Consent: I have reviewed the patients History and Physical, chart, labs and discussed the procedure including the risks, benefits and alternatives for the proposed anesthesia with the patient or authorized representative who has indicated his/her understanding and acceptance.     Dental advisory given  Plan Discussed with: Anesthesiologist and CRNA  Anesthesia Plan Comments:        Anesthesia Quick Evaluation

## 2021-02-16 NOTE — Op Note (Addendum)
Op report   DATE OF OPERATION:  02/16/2021  LOCATION: [place]  SURGICAL DIVISION: Plastic Surgery  PREOPERATIVE DIAGNOSES:  1. History of left breast cancer.  2. Acquired absence of left breast.  3. Right breast asymmetry after reconstruction.  POSTOPERATIVE DIAGNOSES:  Same as preoperative diagnosis  PROCEDURE:  1. Placement of right breast implant for symmetry. 2. Liposuction bilateral breasts for symmetry.  SURGEON: Kennadie Brenner Sanger Richy Spradley, DO  ASSISTANT: Roetta Sessions, PA  ANESTHESIA:  General.   COMPLICATIONS: None.   IMPLANTS: Mentor Smooth Round  High Profile Gel 150cc. Ref #[id number].  Serial Number [id number]  INDICATIONS FOR PROCEDURE:  The patient, Kelli Logan, is a 61 y.o. female born on [birth year], is here for further treatment after a right mastectomy. She now presents for further surgery to gain better symmetry. MRN: [id number]  CONSENT:  Informed consent was obtained directly from the patient. Risks, benefits and alternatives were fully discussed. Specific risks including but not limited to bleeding, infection, hematoma, seroma, scarring, pain, implant infection, implant extrusion, capsular contracture, asymmetry, wound healing problems, and need for further surgery were all discussed. The patient did have an ample opportunity to have her questions answered to her satisfaction.   DESCRIPTION OF PROCEDURE:  The patient was taken to the operating room. SCDs were placed and IV antibiotics were given. The patient's chest was prepped and draped in a sterile fashion. A time out was performed and the implants to be used were identified.  One percent Xylocaine with epinephrine was used to infiltrate the area.  Tumescent was placed in each breast laterally. Liposuction was done on each breast in the lateral aspect (150 cc from each side).  Right:  A 2 cm incision was made at the inframammary fold.  The bovie was used to dissect to the  pectoralis major muscle. The breast tissue was lifted off the pectoralis to create a pocket for the implant.  Hemostasis was achieved with electrocautery.  New gloves were placed.  The implant was placed in the pocket and oriented appropriately. A 3-0 running Monocryl suture was used to close the deep layer.. The remaining skin was closed with 4-0 Monocryl deep dermal and 5-0 Monocryl subcuticular stitches.  Dermabond was applied.  A breast binder and ABD was applied.  The patient was allowed to wake from anesthesia and taken to the recovery room in satisfactory condition.

## 2021-02-16 NOTE — Anesthesia Procedure Notes (Signed)
Procedure Name: LMA Insertion Date/Time: 02/16/2021 12:42 PM Performed by: Tawni Millers, CRNA Pre-anesthesia Checklist: Patient identified, Emergency Drugs available, Suction available and Patient being monitored Patient Re-evaluated:Patient Re-evaluated prior to induction Oxygen Delivery Method: Circle system utilized Preoxygenation: Pre-oxygenation with 100% oxygen Induction Type: IV induction Ventilation: Mask ventilation without difficulty LMA: LMA inserted LMA Size: 4.0 Number of attempts: 1 Airway Equipment and Method: Bite block Placement Confirmation: positive ETCO2 Tube secured with: Tape Dental Injury: Teeth and Oropharynx as per pre-operative assessment

## 2021-02-16 NOTE — Anesthesia Postprocedure Evaluation (Signed)
Anesthesia Post Note  Patient: Kelli Logan  Procedure(s) Performed: PLACEMENT OF BREAST IMPLANT (Right: Breast) LIPOSUCTION OF BILATERAL BREAST (Bilateral: Breast)     Patient location during evaluation: PACU Anesthesia Type: General Level of consciousness: sedated Pain management: pain level controlled Vital Signs Assessment: post-procedure vital signs reviewed and stable Respiratory status: spontaneous breathing and respiratory function stable Cardiovascular status: stable Postop Assessment: no apparent nausea or vomiting Anesthetic complications: no   No notable events documented.  Last Vitals:  Vitals:   02/16/21 1415 02/16/21 1425  BP: 128/79 128/83  Pulse: 77 72  Resp: (!) 21 18  Temp:  37.1 C  SpO2: 97% 97%    Last Pain:  Vitals:   02/16/21 1425  TempSrc:   PainSc: 2                  Latifa Noble DANIEL

## 2021-02-16 NOTE — Transfer of Care (Signed)
Immediate Anesthesia Transfer of Care Note  Patient: Kelli Logan  Procedure(s) Performed: PLACEMENT OF BREAST IMPLANT (Right: Breast) LIPOSUCTION OF BILATERAL BREAST (Bilateral: Breast)  Patient Location: PACU  Anesthesia Type:General  Level of Consciousness: sedated  Airway & Oxygen Therapy: Patient Spontanous Breathing and Patient connected to face mask  Post-op Assessment: Report given to RN and Post -op Vital signs reviewed and stable  Post vital signs: Reviewed and stable  Last Vitals:  Vitals Value Taken Time  BP    Temp    Pulse 79 02/16/21 1342  Resp 21 02/16/21 1342  SpO2 98 % 02/16/21 1342  Vitals shown include unvalidated device data.  Last Pain:  Vitals:   02/16/21 1118  TempSrc: Oral  PainSc: 0-No pain      Patients Stated Pain Goal: 6 (38/10/17 5102)  Complications: No notable events documented.

## 2021-02-16 NOTE — Interval H&P Note (Signed)
History and Physical Interval Note:  02/16/2021 11:51 AM  Kelli Logan  has presented today for surgery, with the diagnosis of Status post breast reconstruction.  The various methods of treatment have been discussed with the patient and family. After consideration of risks, benefits and other options for treatment, the patient has consented to  Procedure(s) with comments: PLACEMENT OF BREAST IMPLANT (Right) - 1.5 hour LIPOSUCTION WITH LIPOFILLING OF LATERAL LEFT BREAST (Left) as a surgical intervention.  The patient's history has been reviewed, patient examined, no change in status, stable for surgery.  I have reviewed the patient's chart and labs.  Questions were answered to the patient's satisfaction.     Kelli Logan

## 2021-02-16 NOTE — Discharge Instructions (Addendum)
INSTRUCTIONS FOR AFTER ABDOMINAL SURGERY  You will likely have some questions about what to expect following your operation.  The following information will help you and your family understand what to expect when you get home.  Following these guidelines will help ensure a smooth recovery and reduce risks of complications.  Postoperative instructions include information on: diet, wound care, medications and physical activity.  AFTER SURGERY Expect to go home after the procedure.  In some cases, you may need to spend one night in the hospital for observation.  DIET This surgery does not require a specific diet.  However, the healthier you eat the better your body can heal. It is important to increasing your protein intake.  Limit foods with high sugar and  carbohydrate content.  Focus on vegetables, meat and other protein sources if you are vegan or vegetarian.  If you undergo liposuction during your procedure it is very important to drink 8 oz of water every hour while awake for 2 days.  If your urine is bright yellow, then it is concentrated, and you need to drink more water.  If you find you are persistently nauseated or unable to take in liquids let us know.  NO TOBACCO USE or EXPOSURE.  This will slow your healing process and increase the risk of a wound.  WOUND CARE Leave the abdominal binder in place for 3 days.  Then you can remove it and shower.  Replace the binder or spanx after your shower.   You may have Topifoam or Lipofoam on.  It is soft and spongy and helps keep you from getting creases if you have liposuction.  This can be removed before the shower and then replaced.  If you need more it is available on [place] as lipofoam.  If you have steri-strips / tape directly attached to your skin leave them in place. It is OK to get these wet.  No baths, pools or hot tubs for four weeks. We close your incision to leave the smallest and best-looking scar. No ointment or creams on your incisions  until cleared by your surgeon.  No Neosporin (Too many skin reactions with this one).  After the steri-strips are off can use Mederma or Skinuva and start massaging the scar. Continue to wear the binder/spanx or Ace wrap around the clock, including while sleeping, for 6 weeks. This provides added comfort and helps reduce the fluid accumulation at the surgery site.  ACTIVITY No heavy lifting until cleared by the doctor.  For example, no more than a half-gallon of milk.  It is OK to walk and you are encouraged to move your legs to help decrease your risk of getting a blood clot.  It will also help keep you from getting deconditioned.  Every 1 to 2 hours get up and walk for 5 minutes. This will help with a quicker recovery back to normal.  Let pain be your guide so you don't do too much.     SLEEPING / RESTING Sleeping and resting should be in the jack-knife or bent forward position with your head elevated.  This will help reduce pulling on your abdominal incision.  You can elevate your head and upper back with a few pillows and place a pillow under your knees.  Avoid stomach sleeping for 3 months.   WORK Everyone returns to work at different times. As a rough guide, most people take 1 - 2 weeks off prior to returning to work. If you need documentation for your  job give them to the front staff for processing.  DRIVING Arrange for someone to bring you home from the hospital.  You may be able to drive a few days after surgery but not while taking any narcotics or valium.  This is for your safety as well as others sharing the road with you.  BOWEL MOVEMENTS Constipation can occur after anesthesia and while taking pain medication.  It is important to stay ahead for your comfort.  We recommend taking Milk of Magnesia (2 tablespoons; twice a day) while taking the pain pills.  MEDICATIONS (you may receive and should be started after surgery) At your preoperative visit for you history and physical you were  given the following medications: Antibiotic: Start this medication when you get home and take according to the instructions on the bottle. Zofran 4 mg:  This is to treat nausea and vomiting.  You can take this every 6 hours as needed and only if needed. Norco (hydrocodone/acetaminophen) 5/325 mg:  This is only to be used after you have taken the Motrin or the Tylenol. Every 8 hours as needed.  Over the counter Medication to take: Ibuprofen (Motrin) 600 mg:  Take this every 6 hours.  If you have additional pain then take 500 mg of the Tylenol.  Only take the Norco after you have tried these two. MiraLAX or stool softener of choice: Take this according to the bottle if you take the Norco.  NEXT dose of Ibuprofen and Tylenol after 5:25 pm as needed for pain.  WHEN TO CALL Call your surgeon's office if any of the following occur:  Fever 101 degrees F or greater  Excessive bleeding or fluid from the incision site.  Pain that increases over time without aid from the medications  Redness, warmth, or pus draining from incision sites  Persistent nausea or inability to take in liquids  Severe misshapen area that underwent the operation.   Post Anesthesia Home Care Instructions  Activity: Get plenty of rest for the remainder of the day. A responsible individual must stay with you for 24 hours following the procedure.  For the next 24 hours, DO NOT: -Drive a car -Paediatric nurse -Drink alcoholic beverages -Take any medication unless instructed by your physician -Make any legal decisions or sign important papers.  Meals: Start with liquid foods such as gelatin or soup. Progress to regular foods as tolerated. Avoid greasy, spicy, heavy foods. If nausea and/or vomiting occur, drink only clear liquids until the nausea and/or vomiting subsides. Call your physician if vomiting continues.  Special Instructions/Symptoms: Your throat may feel dry or sore from the anesthesia or the breathing tube  placed in your throat during surgery. If this causes discomfort, gargle with warm salt water. The discomfort should disappear within 24 hours.  If you had a scopolamine patch placed behind your ear for the management of post- operative nausea and/or vomiting:  1. The medication in the patch is effective for 72 hours, after which it should be removed.  Wrap patch in a tissue and discard in the trash. Wash hands thoroughly with soap and water. 2. You may remove the patch earlier than 72 hours if you experience unpleasant side effects which may include dry mouth, dizziness or visual disturbances. 3. Avoid touching the patch. Wash your hands with soap and water after contact with the patch.

## 2021-02-17 ENCOUNTER — Encounter (HOSPITAL_BASED_OUTPATIENT_CLINIC_OR_DEPARTMENT_OTHER): Payer: Self-pay | Admitting: Plastic Surgery

## 2021-02-17 LAB — SURGICAL PATHOLOGY

## 2021-02-27 NOTE — Progress Notes (Signed)
61 yo female here for follow up after liposuction of bilateral breasts for symmetry and placement of right breast implant for symmetry with Dr. Marla Roe on 02/16/21. She is 1.5 weeks post-op.  She reports overall she is doing really well, she reports that she had a small wound of the right breast inframammary fold incision, she feels as if this has improved.  She had a 150cc Mentor Smooth Round High profile gel implant placed.  Chaperone present on exam On exam right breast incision is intact, right NAC is viable.  Left NAC is viable.  There is no erythema or cellulitic changes of either breast.  No subcutaneous fluid collections noted with palpation.  Small amount of bruising noted laterally of each breast.  Patient has scheduled follow-up in 2 weeks for reevaluation.  There is no signs of infection on exam.  Pictures were obtained of the patient and placed in the chart with the patient's or guardian's permission.

## 2021-02-28 ENCOUNTER — Other Ambulatory Visit: Payer: Self-pay

## 2021-02-28 ENCOUNTER — Ambulatory Visit (INDEPENDENT_AMBULATORY_CARE_PROVIDER_SITE_OTHER): Payer: Medicare Other | Admitting: Surgical

## 2021-02-28 DIAGNOSIS — N651 Disproportion of reconstructed breast: Secondary | ICD-10-CM

## 2021-02-28 DIAGNOSIS — Z9012 Acquired absence of left breast and nipple: Secondary | ICD-10-CM

## 2021-02-28 DIAGNOSIS — Z9889 Other specified postprocedural states: Secondary | ICD-10-CM

## 2021-03-08 ENCOUNTER — Ambulatory Visit: Payer: Medicare Other | Admitting: Licensed Clinical Social Worker

## 2021-03-14 ENCOUNTER — Encounter: Payer: Medicare Other | Admitting: Plastic Surgery

## 2021-04-10 ENCOUNTER — Other Ambulatory Visit: Payer: Self-pay | Admitting: Psychiatry

## 2021-04-10 DIAGNOSIS — F4310 Post-traumatic stress disorder, unspecified: Secondary | ICD-10-CM

## 2021-04-10 NOTE — Telephone Encounter (Signed)
Ordered refill of mirtazapine per request. Please contact the patient to make follow up appointment with Dr. Shea Evans.  ?

## 2021-04-15 ENCOUNTER — Other Ambulatory Visit: Payer: Self-pay | Admitting: Psychiatry

## 2021-04-15 DIAGNOSIS — F410 Panic disorder [episodic paroxysmal anxiety] without agoraphobia: Secondary | ICD-10-CM

## 2021-04-15 DIAGNOSIS — F3342 Major depressive disorder, recurrent, in full remission: Secondary | ICD-10-CM

## 2021-04-15 DIAGNOSIS — F424 Excoriation (skin-picking) disorder: Secondary | ICD-10-CM

## 2021-04-15 DIAGNOSIS — F4310 Post-traumatic stress disorder, unspecified: Secondary | ICD-10-CM

## 2021-04-15 DIAGNOSIS — F4010 Social phobia, unspecified: Secondary | ICD-10-CM

## 2021-04-16 NOTE — Telephone Encounter (Signed)
Ordered duloxetine refill per request. Please contact the patient to make follow up appointment with Dr. Shea Evans.  ?

## 2021-04-24 ENCOUNTER — Other Ambulatory Visit: Payer: Self-pay | Admitting: Psychiatry

## 2021-04-24 DIAGNOSIS — F4310 Post-traumatic stress disorder, unspecified: Secondary | ICD-10-CM

## 2021-05-02 DIAGNOSIS — Z853 Personal history of malignant neoplasm of breast: Secondary | ICD-10-CM | POA: Insufficient documentation

## 2021-05-22 ENCOUNTER — Telehealth (INDEPENDENT_AMBULATORY_CARE_PROVIDER_SITE_OTHER): Payer: Medicare Other | Admitting: Psychiatry

## 2021-05-22 ENCOUNTER — Encounter: Payer: Self-pay | Admitting: Psychiatry

## 2021-05-22 DIAGNOSIS — F410 Panic disorder [episodic paroxysmal anxiety] without agoraphobia: Secondary | ICD-10-CM | POA: Diagnosis not present

## 2021-05-22 DIAGNOSIS — F4310 Post-traumatic stress disorder, unspecified: Secondary | ICD-10-CM | POA: Diagnosis not present

## 2021-05-22 DIAGNOSIS — G4701 Insomnia due to medical condition: Secondary | ICD-10-CM | POA: Insufficient documentation

## 2021-05-22 DIAGNOSIS — G4733 Obstructive sleep apnea (adult) (pediatric): Secondary | ICD-10-CM

## 2021-05-22 DIAGNOSIS — F3342 Major depressive disorder, recurrent, in full remission: Secondary | ICD-10-CM | POA: Insufficient documentation

## 2021-05-22 DIAGNOSIS — F4010 Social phobia, unspecified: Secondary | ICD-10-CM

## 2021-05-22 DIAGNOSIS — F424 Excoriation (skin-picking) disorder: Secondary | ICD-10-CM | POA: Diagnosis not present

## 2021-05-22 MED ORDER — BELSOMRA 5 MG PO TABS: 5.0000 mg | ORAL_TABLET | Freq: Every evening | ORAL | 1 refills | Status: DC | PRN

## 2021-05-22 NOTE — Progress Notes (Signed)
Virtual Visit via Video Note ? ?I connected with Kelli Logan on 05/22/21 at  3:00 PM EDT by a video enabled telemedicine application and verified that I am speaking with the correct person using two identifiers. ? ?Location ?Provider Location : [facility] ?Patient Location : Home ? ?Participants: Patient , Provider ?  ?I discussed the limitations of evaluation and management by telemedicine and the availability of in person appointments. The patient expressed understanding and agreed to proceed. ?  ?I discussed the assessment and treatment plan with the patient. The patient was provided an opportunity to ask questions and all were answered. The patient agreed with the plan and demonstrated an understanding of the instructions. ?  ?The patient was advised to call back or seek an in-person evaluation if the symptoms worsen or if the condition fails to improve as anticipated. ? ?[place] MD OP Progress Note ? ?05/22/2021 3:34 PM ?Kelli Logan  ?MRN:  [id number] ? ?Chief Complaint:  ?Chief Complaint  ?Patient presents with  ? Follow-up: 61 year old Caucasian female with history of depression, anxiety, skin picking disorder, PTSD, borderline personality disorder, presented with sleep problems.  ? ?HPI: Kelli Logan is a 61 year old Caucasian female, divorced, lives in [place], has a history of skin picking disorder, PTSD, social anxiety, history of borderline personality disorder, cognitive disorder, breast cancer per history, fibromyalgia, total hip replacement, obstructive sleep apnea noncompliant on CPAP was evaluated by telemedicine today. ? ?Patient today reports she is overall doing fairly well with regards to her mood.  Denies any depression or anxiety. ? ?Patient reports she however currently struggles with sleep.  She reports she has a lot of postnasal drip and is unable to stay asleep.  That does keep her awake or interrupts her sleep.  Patient with history of sleep apnea, has been unable to afford her CPAP  mask.She hence has been noncompliant with it.  That likely also could be affecting her sleep. ? ?Patient otherwise denies any suicidality, homicidality or perceptual disturbances. ? ?Currently compliant on the Cymbalta, mirtazapine.  Denies side effects. ? ?Patient denies any other concerns today. ? ? ? ?Visit Diagnosis:  ?  ICD-10-CM   ?1. PTSD (post-traumatic stress disorder)  F43.10   ?  ?2. Skin-picking disorder  F42.4   ?  ?3. MDD (major depressive disorder), recurrent, in full remission ([place])  F33.42   ?  ?4. Panic disorder  F41.0   ?  ?5. Social anxiety disorder  F40.10   ?  ?6. OSA (obstructive sleep apnea)  G47.33   ?  ?7. Insomnia due to medical condition  G47.01 Suvorexant (BELSOMRA) 5 MG TABS  ? Postnasal drip, sleep apnea  ?  ? ? ?Past Psychiatric History: Reviewed past psychiatric history from progress note on 03/10/2018. ? ?Past Medical History:  ?Past Medical History:  ?Diagnosis Date  ? Anxiety   ? Arthritis   ? Cancer [facility])   ? breast left  ? Depression   ? Fibromyalgia   ? GERD (gastroesophageal reflux disease)   ? Headache   ? History of kidney stones   ? h/o  ? Hypertension   ? Hypothyroidism   ? Sleep apnea   ? DOES NOT USE CPAP  ? Thyroid disease   ?  ?Past Surgical History:  ?Procedure Laterality Date  ? APPENDECTOMY    ? BREAST BIOPSY Left few yrs ago  ? stereotactic bx in [place], benign  ? BREAST BIOPSY Left 12/26/2017  ? Affirm Bx [facility]- X-Clip  ? BREAST BIOPSY  Left 01/14/2018  ? Affirm Bx- Coil clip- path pending  ? BREAST IMPLANT EXCHANGE Right 02/16/2021  ? Procedure: PLACEMENT OF BREAST IMPLANT;  Surgeon: Wallace Going, DO;  Location: [facility];  Service: Plastics;  Laterality: Right;  1.5 hour  ? BREAST RECONSTRUCTION WITH PLACEMENT OF TISSUE EXPANDER AND ALLODERM Left 04/08/2018  ? Procedure: LEFT BREAST IMMEDIATE  RECONSTRUCTION WITH EXPANDER AND FLEX HD;  Surgeon: Wallace Going, DO;  Location: [facility];  Service: Plastics;  Laterality: Left;  ? COLONOSCOPY WITH  PROPOFOL N/A 06/26/2018  ? Procedure: COLONOSCOPY WITH PROPOFOL;  Surgeon: Virgel Manifold, MD;  Location: [facility];  Service: Endoscopy;  Laterality: N/A;  ? ESOPHAGOGASTRODUODENOSCOPY (EGD) WITH PROPOFOL N/A 06/26/2018  ? Procedure: ESOPHAGOGASTRODUODENOSCOPY (EGD) WITH PROPOFOL;  Surgeon: Virgel Manifold, MD;  Location: [facility];  Service: Endoscopy;  Laterality: N/A;  ? EYE SURGERY Bilateral   ? lasik  ? FOOT BONE EXCISION    ? IMAGE GUIDED SINUS SURGERY  10/220  ? LIPOSUCTION Bilateral 02/16/2021  ? Procedure: LIPOSUCTION OF BILATERAL BREAST;  Surgeon: Wallace Going, DO;  Location: [facility];  Service: Plastics;  Laterality: Bilateral;  ? LIPOSUCTION WITH LIPOFILLING Left 04/30/2019  ? Procedure: Fat grafting to left breast;  Surgeon: Wallace Going, DO;  Location: [place];  Service: Plastics;  Laterality: Left;  90 min, please  ? MASTECTOMY Left 04/08/2019  ? MASTECTOMY W/ SENTINEL NODE BIOPSY Left 04/08/2018  ? Procedure: MASTECTOMY WITH SENTINEL LYMPH NODE BIOPSY LEFT;  Surgeon: Fredirick Maudlin, MD;  Location: [facility];  Service: General;  Laterality: Left;  ? MASTOPEXY Right 08/04/2018  ? Procedure: MASTOPEXY;  Surgeon: Wallace Going, DO;  Location: [facility];  Service: Plastics;  Laterality: Right;  TOTAL CASE TIME SHOULD BE 3 HOURS, PLEASE  ? REMOVAL OF TISSUE EXPANDER AND PLACEMENT OF IMPLANT Left 08/04/2018  ? Procedure: REMOVAL OF TISSUE EXPANDER AND PLACEMENT OF IMPLANT;  Surgeon: Wallace Going, DO;  Location: [facility];  Service: Plastics;  Laterality: Left;  ? SCAR REVISION Left 04/30/2019  ? Procedure: release of scar contracture to left breast;  Surgeon: Wallace Going, DO;  Location: [place];  Service: Plastics;  Laterality: Left;  ? TOTAL HIP ARTHROPLASTY Left 02/11/2020  ? Procedure: TOTAL HIP ARTHROPLASTY ANTERIOR APPROACH;  Surgeon: Hessie Knows, MD;  Location: [facility];  Service: Orthopedics;   Laterality: Left;  ? [place] and 1985  ? ? ?Family Psychiatric History: Reviewed family psychiatric history from progress note on 03/10/2018. ? ?Family History:  ?Family History  ?Problem Relation Age of Onset  ? Hypertension Mother   ? Lung cancer Maternal Grandmother   ? Heart disease Father   ? Alcohol abuse Father   ? Breast cancer Neg Hx   ? Colon cancer Neg Hx   ? ? ?Social History: Reviewed social history from progress note on 03/10/2018. ?Social History  ? ?Socioeconomic History  ? Marital status: Divorced  ?  Spouse name: Not on file  ? Number of children: 1  ? Years of education: Not on file  ? Highest education level: High school graduate  ?Occupational History  ? Not on file  ?Tobacco Use  ? Smoking status: Former  ?  Packs/day: 1.00  ?  Years: 2.00  ?  Pack years: 2.00  ?  Types: Cigarettes  ?  Quit date: 01/06/2017  ?  Years since quitting: 4.3  ? Smokeless tobacco: Never  ?  Vaping Use  ? Vaping Use: Former  ? Quit date: 11/27/2016  ?Substance and Sexual Activity  ? Alcohol use: Yes  ?  Comment: rare beer   ? Drug use: Not Currently  ? Sexual activity: Not on file  ?Other Topics Concern  ? Not on file  ?Social History Narrative  ? Home on disability [pysche problems]; transportation issues; worked in Landscape [employer]. 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 Quit smoking; ocassional alcohol.   ? ?Social Determinants of Health  ? ?Financial Resource Strain: Not on file  ?Food Insecurity: Not on file  ?Transportation Needs: Not on file  ?Physical Activity: Not on file  ?Stress: Not on file  ?Social Connections: Not on file  ? ? ?Allergies:  ?Allergies  ?Allergen Reactions  ? Tape Other (See Comments)  ?  Paper tape after breast surgery/Burned skin   ?Tegaderm and other tape OK  ? ? ?Metabolic Disorder Labs: ?No results found for: HGBA1C, MPG ?No results found for: PROLACTIN ?No results found for: CHOL, TRIG, HDL, CHOLHDL, VLDL, LDLCALC ?No results found for: TSH ? ?Therapeutic Level Labs: ?No  results found for: LITHIUM ?No results found for: VALPROATE ?No components found for:  CBMZ ? ?Current Medications: ?Current Outpatient Medications  ?Medication Sig Dispense Refill  ? atorvastatin (LIPIT

## 2021-05-22 NOTE — Patient Instructions (Signed)
Suvorexant Tablets ?What is this medication? ?SUVOREXANT (SOO voe REX ant) treats insomnia. It helps you go to sleep faster and stay asleep through the night. ?This medicine may be used for other purposes; ask your health care provider or pharmacist if you have questions. ?COMMON BRAND NAME(S): Belsomra ?What should I tell my care team before I take this medication? ?They need to know if you have any of these conditions: ?Depression ?History of substance abuse or addiction ?History of a sudden onset of muscle weakness (cataplexy) ?History of falling asleep often at unexpected times (narcolepsy) ?If you often drink alcohol ?Liver disease ?Lung or breathing disease ?Sleep apnea ?Sleep-walking, driving, eating or other activity while not fully awake after taking a sleep medication ?Suicidal thoughts, plans, or attempt; a previous suicide attempt by you or a family member ?An unusual or allergic reaction to suvorexant, other medications, foods, dyes, or preservatives ?Pregnant or trying to get pregnant ?Breast-feeding ?How should I use this medication? ?Take this medication by mouth with water 30 minutes before going to bed. Follow the directions on the prescription label. It is better to take this medication on an empty stomach. Do not take your medication more often than directed. ?A special MedGuide will be given to you by the pharmacist with each prescription and refill. Be sure to read this information carefully each time. ?Talk to your care team about the use of this medication in children. Special care may be needed. ?Overdosage: If you think you have taken too much of this medicine contact a poison control center or emergency room at once. ?NOTE: This medicine is only for you. Do not share this medicine with others. ?What if I miss a dose? ?This does not apply. This medication should only be taken as directed before going to sleep. Do not take double or extra doses. ?What may interact with this  medication? ?Alcohol ?Antihistamines for allergy, cough, or cold ?Certain antibiotics, such as erythromycin or clarithromycin ?Certain antivirals for HIV or hepatitis ?Certain medications for fungal infections, such as itraconazole, ketoconazole, posaconazole ?Certain medications for mental health conditions ?Certain medications for seizures, such as carbamazepine, phenytoin, phenobarbital ?Conivaptan ?Diltiazem ?General anesthetics, such as halothane, isoflurane, methoxyflurane, propofol ?Grapefruit juice ?Medications that relax muscles for surgery ?Opioid medications for pain ?Other medications for sleep ?Rifampin ?Viburnum ?Verapamil ?This list may not describe all possible interactions. Give your health care provider a list of all the medicines, herbs, non-prescription drugs, or dietary supplements you use. Also tell them if you smoke, drink alcohol, or use illegal drugs. Some items may interact with your medicine. ?What should I watch for while using this medication? ?Visit your care team for regular checks on your progress. Keep a regular sleep schedule by going to bed at about the same time each night. Avoid caffeine-containing drinks in the evening hours. Talk to your care team if your insomnia worsens or is not better within 7 to 10 days. ?After taking this medication, you may get up out of bed and do an activity that you do not know you are doing. The next morning, you may have no memory of this. Activities include driving a car ("sleep-driving"), making and eating food, talking on the phone, sexual activity, and sleep-walking. Serious injuries have occurred. Stop the medication and call your care team right away if you find out you have done any of these activities. Do not take this medication if you have used alcohol that evening. Do not take it if you have taken another  medication for sleep. The risk of doing these sleep-related activities is higher. ?Do not take this medication unless you are  able to stay in bed for a full night (7 to 8 hours) before you must be active again. Tell your care team if you will need to perform activities requiring full alertness, such as driving, the next day. You may have a decrease in mental alertness the day after use, even if you feel that you are fully awake. Do not stand or sit up quickly after taking this medication, especially if you are an older patient. This reduces the risk of dizzy or fainting spells. ?If you or your family notice any changes in your moods or behavior, such as new or worsening depression, thoughts of harming yourself, anxiety, other unusual or disturbing thoughts, or memory loss, call your care team right away. ?After you stop taking this medication, you may have trouble falling asleep. This is called rebound insomnia. This problem usually goes away on its own after 1 or 2 nights. ?What side effects may I notice from receiving this medication? ?Side effects that you should report to your care team as soon as possible: ?Allergic reactions--skin rash, itching, hives, swelling of the face, lips, tongue, or throat ?CNS depression--slow or shallow breathing, shortness of breath, feeling faint, dizziness, confusion, trouble staying awake ?Mood and behavior changes--anxiety, nervousness, confusion, hallucinations, irritability, hostility, thoughts of suicide or self-harm, worsening mood, feelings of depression ?Sudden and temporary muscle weakness ?Unable to move or speak for several minutes upon waking or going to sleep ?Unusual sleep behaviors or activities you do not remember, such as driving, eating, or sexual activity ?Side effects that usually do not require medical attention (report these to your care team if they continue or are bothersome): ?Drowsiness the day after use ?Vivid dreams or nightmares ?This list may not describe all possible side effects. Call your doctor for medical advice about side effects. You may report side effects to FDA at  1-800-FDA-1088. ?Where should I keep my medication? ?Keep out of the reach of children and pets. This medication can be abused. Keep it in a safe place to protect it from theft. Do not share it with anyone. It is only for you. Selling or giving away this medication is dangerous and against the law. ?Store between 20 and 25 degrees C (68 and 77 degrees F). Protect from light and moisture. Keep the container tightly closed. Get rid of any unused medication after the expiration date. ?This medication may cause harm and death if it is taken by other adults, children, or pets. It is important to get rid of the medication as soon as you no longer need it or it is expired. You can do this in two ways: ?Take the medication to a medication take-back program. Check with your pharmacy or law enforcement to find a location. ?If you cannot return the medication, check the label or package insert to see if the medication should be thrown out in the garbage or flushed down the toilet. If you are not sure, ask your care team. If it is safe to put it in the trash, take the medication out of the container. Mix the medication with cat litter, dirt, coffee grounds, or other unwanted substance. Seal the mixture in a bag or container. Put it in the trash. ?NOTE: This sheet is a summary. It may not cover all possible information. If you have questions about this medicine, talk to your doctor, pharmacist, or health care provider. ??  2023 Elsevier/Gold Standard (2020-08-18 00:00:00)  

## 2021-05-29 ENCOUNTER — Encounter (INDEPENDENT_AMBULATORY_CARE_PROVIDER_SITE_OTHER): Payer: Self-pay | Admitting: Ophthalmology

## 2021-05-29 ENCOUNTER — Ambulatory Visit (INDEPENDENT_AMBULATORY_CARE_PROVIDER_SITE_OTHER): Payer: Medicare Other | Admitting: Ophthalmology

## 2021-05-29 ENCOUNTER — Encounter (INDEPENDENT_AMBULATORY_CARE_PROVIDER_SITE_OTHER): Payer: Medicare Other | Admitting: Ophthalmology

## 2021-05-29 DIAGNOSIS — I10 Essential (primary) hypertension: Secondary | ICD-10-CM | POA: Diagnosis not present

## 2021-05-29 DIAGNOSIS — H33323 Round hole, bilateral: Secondary | ICD-10-CM

## 2021-05-29 DIAGNOSIS — H35413 Lattice degeneration of retina, bilateral: Secondary | ICD-10-CM

## 2021-05-29 DIAGNOSIS — H35033 Hypertensive retinopathy, bilateral: Secondary | ICD-10-CM | POA: Diagnosis not present

## 2021-05-29 DIAGNOSIS — H25813 Combined forms of age-related cataract, bilateral: Secondary | ICD-10-CM

## 2021-05-29 DIAGNOSIS — H35371 Puckering of macula, right eye: Secondary | ICD-10-CM

## 2021-05-29 DIAGNOSIS — Z9889 Other specified postprocedural states: Secondary | ICD-10-CM

## 2021-05-29 NOTE — Progress Notes (Signed)
[facility] Note  05/29/2021     CHIEF COMPLAINT Patient presents for Retina Evaluation   HISTORY OF PRESENT ILLNESS: Kelli Logan is a 61 y.o. female who presents to the clinic today for:   HPI     Retina Evaluation   In both eyes.  This started 1 month ago.  Duration of 1 month.  I, the attending physician,  performed the HPI with the patient and updated documentation appropriately.        Comments   Patient here for Retina Evaluation. Referred by Dr Ellin Mayhew. Patient states had Lasik 15 years ago. Mono Va OD Near, OS Distance. Last month or so notice vision being blurry. A lot of discomfort. Not sure if pain.       Last edited by Bernarda Caffey, MD on 05/29/2021  5:13 PM.    Patient states vision blurry for the past months or so. Has monovision. OD is for near, OS is for distance, using LASIK surgery 15 years ago. Sees some floaters and flashes.   Referring physician: Anell Barr, [place],  [place] [address]  HISTORICAL INFORMATION:   Selected notes from the MEDICAL RECORD NUMBER Referred by Dr. Anell Barr for retinal breaks LEE:  Ocular Hx- PMH-    CURRENT MEDICATIONS: No current outpatient medications on file. (Ophthalmic Drugs)   No current facility-administered medications for this visit. (Ophthalmic Drugs)   Current Outpatient Medications (Other)  Medication Sig   atorvastatin (LIPITOR) 40 MG tablet Take 20 mg by mouth at bedtime.   Coenzyme Q10 (COQ10) 100 MG CAPS Take 100 mg by mouth every evening.   DULoxetine (CYMBALTA) 30 MG capsule Take 1 capsule (30 mg total) by mouth daily.   hydrochlorothiazide (HYDRODIURIL) 25 MG tablet Take 25 mg by mouth daily.    hydrOXYzine (VISTARIL) 50 MG capsule Take 1 capsule (50 mg total) by mouth daily as needed. For sleep, anxiety   levothyroxine (SYNTHROID) 75 MCG tablet Take 75 mcg by mouth every other day. Alternating with 50 mcg every other day-AM    levothyroxine (SYNTHROID, LEVOTHROID) 50 MCG tablet Take 50 mcg by mouth every other day. Alternating with 75 mcg every other day-AM   losartan (COZAAR) 100 MG tablet Take 100 mg by mouth daily. for high blood pressure   methocarbamol (ROBAXIN) 500 MG tablet Take 250-500 mg by mouth at bedtime as needed.   metoprolol (LOPRESSOR) 50 MG tablet Take 1 tablet (50 mg total) by mouth 2 (two) times daily.   mirtazapine (REMERON) 15 MG tablet Take 1 tablet (15 mg total) by mouth at bedtime. FOR SLEEP   Multiple Vitamin (MULTIVITAMIN WITH MINERALS) TABS tablet Take 1 tablet by mouth daily.   NONFORMULARY OR COMPOUNDED ITEM 1 Device by Other route as directed. CPAP-with the pressure setting of 10 cm H20 with heated humidity and ResMed AirFit F20 Full Face Mask , size small.   Omega-3 Fatty Acids (FISH OIL PO) Take 1 capsule by mouth daily.   omeprazole (PRILOSEC) 20 MG capsule Take 20 mg by mouth every morning.   Suvorexant (BELSOMRA) 5 MG TABS Take 5 mg by mouth at bedtime as needed.   doxazosin (CARDURA) 1 MG tablet Take 1 tablet (1 mg total) by mouth 2 (two) times daily. Needs office visit for further refills. Thank you! (Patient not taking: Reported on 05/22/2021)   No current facility-administered medications for this visit. (Other)      REVIEW OF SYSTEMS: ROS  Positive for: Neurological, Eyes Negative for: Constitutional, Gastrointestinal, Skin, Genitourinary, Musculoskeletal, HENT, Endocrine, Cardiovascular, Respiratory, Psychiatric, Allergic/Imm, Heme/Lymph Last edited by Jobe Marker, COT on 05/29/2021  3:51 PM.     ALLERGIES Allergies  Allergen Reactions   Tape Other (See Comments)    Paper tape after breast surgery/Burned skin   Tegaderm and other tape OK   PAST MEDICAL HISTORY Past Medical History:  Diagnosis Date   Anxiety    Arthritis    Cancer ([place])    breast left   Depression    Fibromyalgia    GERD (gastroesophageal reflux disease)    Headache    History of kidney  stones    h/o   Hypertension    Hypothyroidism    Sleep apnea    DOES NOT USE CPAP   Thyroid disease    Past Surgical History:  Procedure Laterality Date   APPENDECTOMY     BREAST BIOPSY Left few yrs ago   stereotactic bx in [place], benign   BREAST BIOPSY Left 12/26/2017   Affirm Bx IMC- X-Clip   BREAST BIOPSY Left 01/14/2018   Affirm Bx- Coil clip- path pending   BREAST IMPLANT EXCHANGE Right 02/16/2021   Procedure: PLACEMENT OF BREAST IMPLANT;  Surgeon: Wallace Going, DO;  Location: [facility];  Service: Plastics;  Laterality: Right;  1.5 hour   BREAST RECONSTRUCTION WITH PLACEMENT OF TISSUE EXPANDER AND ALLODERM Left 04/08/2018   Procedure: LEFT BREAST IMMEDIATE  RECONSTRUCTION WITH EXPANDER AND FLEX HD;  Surgeon: Wallace Going, DO;  Location: [facility];  Service: Plastics;  Laterality: Left;   COLONOSCOPY WITH PROPOFOL N/A 06/26/2018   Procedure: COLONOSCOPY WITH PROPOFOL;  Surgeon: Virgel Manifold, MD;  Location: [facility];  Service: Endoscopy;  Laterality: N/A;   ESOPHAGOGASTRODUODENOSCOPY (EGD) WITH PROPOFOL N/A 06/26/2018   Procedure: ESOPHAGOGASTRODUODENOSCOPY (EGD) WITH PROPOFOL;  Surgeon: Virgel Manifold, MD;  Location: [facility];  Service: Endoscopy;  Laterality: N/A;   EYE SURGERY Bilateral    lasik   FOOT BONE EXCISION     IMAGE GUIDED SINUS SURGERY  10/220   LIPOSUCTION Bilateral 02/16/2021   Procedure: LIPOSUCTION OF BILATERAL BREAST;  Surgeon: Wallace Going, DO;  Location: [facility];  Service: Plastics;  Laterality: Bilateral;   LIPOSUCTION WITH LIPOFILLING Left 04/30/2019   Procedure: Fat grafting to left breast;  Surgeon: Wallace Going, DO;  Location: [facility];  Service: Plastics;  Laterality: Left;  90 min, please   MASTECTOMY Left 04/08/2019   MASTECTOMY W/ SENTINEL NODE BIOPSY Left 04/08/2018   Procedure: MASTECTOMY WITH SENTINEL LYMPH NODE BIOPSY LEFT;  Surgeon: Fredirick Maudlin, MD;  Location: [facility];  Service: General;  Laterality: Left;   MASTOPEXY Right 08/04/2018   Procedure: MASTOPEXY;  Surgeon: Wallace Going, DO;  Location: [facility];  Service: Plastics;  Laterality: Right;  TOTAL CASE TIME SHOULD BE 3 HOURS, PLEASE   REMOVAL OF TISSUE EXPANDER AND PLACEMENT OF IMPLANT Left 08/04/2018   Procedure: REMOVAL OF TISSUE EXPANDER AND PLACEMENT OF IMPLANT;  Surgeon: Wallace Going, DO;  Location: [facility];  Service: Plastics;  Laterality: Left;   SCAR REVISION Left 04/30/2019   Procedure: release of scar contracture to left breast;  Surgeon: Wallace Going, DO;  Location: [facility];  Service: Plastics;  Laterality: Left;   TOTAL HIP ARTHROPLASTY Left 02/11/2020   Procedure: TOTAL HIP ARTHROPLASTY ANTERIOR APPROACH;  Surgeon: Hessie Knows, MD;  Location: [facility];  Service: Orthopedics;  Laterality: Left;   UMBILICAL HERNIA REPAIR  1983 and 1985   FAMILY HISTORY Family History  Problem Relation Age of Onset   Hypertension Mother    Lung cancer Maternal Grandmother    Heart disease Father    Alcohol abuse Father    Breast cancer Neg Hx    Colon cancer Neg Hx    SOCIAL HISTORY Social History   Tobacco Use   Smoking status: Former    Packs/day: 1.00    Years: 2.00    Pack years: 2.00    Types: Cigarettes    Quit date: 01/06/2017    Years since quitting: 4.3   Smokeless tobacco: Never  Vaping Use   Vaping Use: Former   Quit date: 11/27/2016  Substance Use Topics   Alcohol use: Yes    Comment: rare beer    Drug use: Not Currently       OPHTHALMIC EXAM:  Base Eye Exam     Visual Acuity (Snellen - Linear)       Right Left   Dist [place] 20/40 -2 20/25 -2   Dist ph [place] 20/30 20/20  Patient mono vision OD Near used J card, OS Distance        Tonometry (Tonopen, 2:17 PM)       Right Left   Pressure 11 10         Pupils       Dark Light Shape React APD   Right 4 3 Round Brisk None   Left 4 3 Round  Brisk None         Visual Fields (Counting fingers)       Left Right    Full Full         Extraocular Movement       Right Left    Full, Ortho Full, Ortho         Neuro/Psych     Oriented x3: Yes   Mood/Affect: Normal         Dilation     Both eyes: 1.0% Mydriacyl, 2.5% Phenylephrine @ 2:16 PM           Slit Lamp and Fundus Exam     Slit Lamp Exam       Right Left   Lids/Lashes dermatochalasis dermatochalasis   Conjunctiva/Sclera whtie and quiet whtie and quiet   Cornea trace PEE, trace EBMD, well healed LASIK flap trace PEE, well healed LASIK flap   Anterior Chamber deep and clear deep and clear   Iris round and dilated round and dilated   Lens 1-2+ NS, 1-2+ cortical 2+ NS, 2+ cortical   Anterior Vitreous PVD, vitreous condensations, mild syneresis mild syneresis, PVD, vitreous condensation         Fundus Exam       Right Left   Disc pink, sharp, tilted, + cupping, temporal PPA pink, sharp, tilted, + cupping, temporal PPA   C/D Ratio 0.75 0.65   Macula Flat, blunted foveal reflex, mild ERM inferonasal macula Flat, good foveal reflex, mild RPE mottling, no heme or edema   Vessels mild attenuation mild attenuation   Periphery attached, no heme, lattice with retinal tears from 1130 to 12, round operculated hole with pigment ring at 0130, pigmented retinal break at 1000 attached, no heme, no RT/RD, pigmented lattice at 1230           Refraction     Manifest Refraction       Sphere Cylinder Axis Dist VA   Right -  0.75 +0.50 092 20/30   Left -0.25 +0.50 085 20/30            IMAGING AND PROCEDURES  Imaging and Procedures for 05/29/2021  OCT, Retina - OU - Both Eyes       Right Eye Quality was good. Central Foveal Thickness: 265 (Focal ERM with pucker inferonasal macula). Progression has no prior data. Findings include normal foveal contour, no IRF, no SRF, macular pucker, epiretinal membrane.   Left Eye Quality was good. Central  Foveal Thickness: 261 (Partial PVD). Progression has no prior data. Findings include normal foveal contour, no IRF, no SRF.   Notes *Images captured and stored on drive  Diagnosis / Impression:  OD: ERM with macular pucker inferonasal macula OS: NFP; no IRF/SRF  Clinical management:  See below  Abbreviations: NFP - Normal foveal profile. CME - cystoid macular edema. PED - pigment epithelial detachment. IRF - intraretinal fluid. SRF - subretinal fluid. EZ - ellipsoid zone. ERM - epiretinal membrane. ORA - outer retinal atrophy. ORT - outer retinal tubulation. SRHM - subretinal hyper-reflective material. IRHM - intraretinal hyper-reflective material      Repair Retinal Breaks, Laser - OD - Right Eye       LASER PROCEDURE NOTE  Procedure:  Barrier laser retinopexy using slit lamp laser, RIGHT eye   Diagnosis:   Lattice degeneration and retinal breaks, RIGHT eye                     Patch of lattice w/ breaks from 1130-1200; pigmented hole at 0130; pigmented break at 1000  Surgeon: Bernarda Caffey, MD, PhD  Anesthesia: Topical  Informed consent obtained, operative eye marked, and time out performed prior to initiation of laser.   Laser settings:  Lumenis [id number] laser, slit lamp Lens: [place] PRP 165 Power: 280 mW Spot size: 200 microns Duration: 30 msec  # spots: 559  Placement of laser: Using a [place]ster PRP 165 contact lens at the slit lamp, laser was placed in three confluent rows around patch of lattice w/ breaks from 1130-1200; pigmented hole at 0130; pigmented break at 1000.  Complications: None.  Patient tolerated the procedure well and received written and verbal post-procedure care information/education.            ASSESSMENT/PLAN:    ICD-10-CM   1. Bilateral retinal lattice degeneration  H35.413 Repair Retinal Breaks, Laser - OD - Right Eye    2. Retinal hole of both eyes  H33.323 Repair Retinal Breaks, Laser - OD - Right Eye    3. Epiretinal membrane  (ERM) of right eye  H35.371 OCT, Retina - OU - Both Eyes    4. Essential hypertension  I10     5. Hypertensive retinopathy of both eyes  H35.033 OCT, Retina - OU - Both Eyes    6. Combined forms of age-related cataract of both eyes  H25.813     7. Hx of LASIK  Z98.890      1,2. Lattice degeneration OU with retinal holes OU  - OD: Patch of lattice w/ breaks from 1130-1200; pigmented hole at 0130; pigmented break at 1000  - OS: pigmented lattice at 1230 - discussed findings, prognosis, and treatment options including observation - recommend laser retinopexy OU, OD first today, 05.22.23 - pt wishes to proceed with laser OD - RBA of procedure discussed, questions answered - informed consent obtained and signed - see procedure note - start Lotemax SM QID OD x7 days - f/u in 1-2 weeks for  laser retinopexy OS   3. Epiretinal membrane, right eye  - The natural history, anatomy, potential for loss of vision, and treatment options including vitrectomy techniques and the complications of endophthalmitis, retinal detachment, vitreous hemorrhage, cataract progression and permanent vision loss discussed with the patient. - mild ERM w/ pucker -- inf nasal mac, noncentral - BCVA 20/30 - asymptomatic, no metamorphopsia - no indication for surgery at this time - monitor for now  4,5. Hypertensive retinopathy OU - discussed importance of tight BP control - monitor   6. Age related cataracts OU   - The symptoms of cataract, surgical options, and treatments and risks were discussed with patient. - discussed diagnosis and progression - not yet visually significant - monitor   7. History of LASIK OU   Ophthalmic Meds Ordered this visit:  No orders of the defined types were placed in this encounter.    Return in 15 days (on 06/13/2021) for f/u lattice / retinal breaks OU - laser OS.  There are no Patient Instructions on file for this visit.   Explained the diagnoses, plan, and follow up  with the patient and they expressed understanding.  Patient expressed understanding of the importance of proper follow up care.   This document serves as a record of services personally performed by Gardiner Sleeper, MD, PhD. It was created on their behalf by Roselee Nova, COMT. The creation of this record is the provider's dictation and/or activities during the visit.  Electronically signed by: Roselee Nova, COMT 05/29/21 5:31 PM  Gardiner Sleeper, M.D., Ph.D. Diseases & Surgery of the Retina and Vitreous [facility]  I have reviewed the above documentation for accuracy and completeness, and I agree with the above. Gardiner Sleeper, M.D., Ph.D. 05/29/21 5:31 PM  Abbreviations: M myopia (nearsighted); A astigmatism; H hyperopia (farsighted); P presbyopia; Mrx spectacle prescription;  CTL contact lenses; OD right eye; OS left eye; OU both eyes  XT exotropia; ET esotropia; PEK punctate epithelial keratitis; PEE punctate epithelial erosions; DES dry eye syndrome; MGD meibomian gland dysfunction; ATs artificial tears; PFAT's preservative free artificial tears; [place] nuclear sclerotic cataract; PSC posterior subcapsular cataract; ERM epi-retinal membrane; PVD posterior vitreous detachment; RD retinal detachment; DM diabetes mellitus; DR diabetic retinopathy; NPDR non-proliferative diabetic retinopathy; PDR proliferative diabetic retinopathy; CSME clinically significant macular edema; DME diabetic macular edema; dbh dot blot hemorrhages; CWS cotton wool spot; POAG primary open angle glaucoma; C/D cup-to-disc ratio; HVF humphrey visual field; GVF goldmann visual field; OCT optical coherence tomography; IOP intraocular pressure; BRVO Branch retinal vein occlusion; CRVO central retinal vein occlusion; CRAO central retinal artery occlusion; BRAO branch retinal artery occlusion; RT retinal tear; SB scleral buckle; PPV pars plana vitrectomy; VH Vitreous hemorrhage; PRP panretinal laser  photocoagulation; IVK intravitreal kenalog; VMT vitreomacular traction; MH Macular hole;  NVD neovascularization of the disc; NVE neovascularization elsewhere; AREDS age related eye disease study; ARMD age related macular degeneration; POAG primary open angle glaucoma; EBMD epithelial/anterior basement membrane dystrophy; ACIOL anterior chamber intraocular lens; IOL intraocular lens; PCIOL posterior chamber intraocular lens; Phaco/IOL phacoemulsification with intraocular lens placement; [place] photorefractive keratectomy; LASIK laser assisted in situ keratomileusis; HTN hypertension; DM diabetes mellitus; COPD chronic obstructive pulmonary disease

## 2021-06-01 NOTE — Progress Notes (Signed)
[facility] Note  06/13/2021     CHIEF COMPLAINT Patient presents for Retina Follow Up   HISTORY OF PRESENT ILLNESS: Kelli Logan is a 61 y.o. female who presents to the clinic today for:   HPI     Retina Follow Up   Patient presents with  Other.  In both eyes.  This started 2 weeks ago.  I, the attending physician,  performed the HPI with the patient and updated documentation appropriately.        Comments   Patient here for 2 weeks retina follow up for laser / laser OS. Patient states vision floaters are a little worse. No eye pain. Has a lot of muscular eye discomfort. See halos and glaring.       Last edited by Bernarda Caffey, MD on 06/13/2021  4:30 PM.     Patient states she had no problems after the laser procedure at last visit  Referring physician: Ane Payment, [address],  [place] [address]  HISTORICAL INFORMATION:   Selected notes from the MEDICAL RECORD NUMBER Referred by Dr. Anell Barr for retinal breaks LEE:  Ocular Hx- PMH-    CURRENT MEDICATIONS: No current outpatient medications on file. (Ophthalmic Drugs)   No current facility-administered medications for this visit. (Ophthalmic Drugs)   Current Outpatient Medications (Other)  Medication Sig   atorvastatin (LIPITOR) 40 MG tablet Take 20 mg by mouth at bedtime.   Coenzyme Q10 (COQ10) 100 MG CAPS Take 100 mg by mouth every evening.   DULoxetine (CYMBALTA) 30 MG capsule Take 1 capsule (30 mg total) by mouth daily.   hydrochlorothiazide (HYDRODIURIL) 25 MG tablet Take 25 mg by mouth daily.    hydrOXYzine (VISTARIL) 50 MG capsule Take 1 capsule (50 mg total) by mouth daily as needed. For sleep, anxiety   levothyroxine (SYNTHROID) 75 MCG tablet Take 75 mcg by mouth every other day. Alternating with 50 mcg every other day-AM   levothyroxine (SYNTHROID, LEVOTHROID) 50 MCG tablet Take 50 mcg by mouth every other day. Alternating with 75 mcg  every other day-AM   losartan (COZAAR) 100 MG tablet Take 100 mg by mouth daily. for high blood pressure   methocarbamol (ROBAXIN) 500 MG tablet Take 250-500 mg by mouth at bedtime as needed.   metoprolol (LOPRESSOR) 50 MG tablet Take 1 tablet (50 mg total) by mouth 2 (two) times daily.   mirtazapine (REMERON) 15 MG tablet Take 1 tablet (15 mg total) by mouth at bedtime. FOR SLEEP   Multiple Vitamin (MULTIVITAMIN WITH MINERALS) TABS tablet Take 1 tablet by mouth daily.   NONFORMULARY OR COMPOUNDED ITEM 1 Device by Other route as directed. CPAP-with the pressure setting of 10 cm H20 with heated humidity and ResMed AirFit F20 Full Face Mask , size small.   Omega-3 Fatty Acids (FISH OIL PO) Take 1 capsule by mouth daily.   omeprazole (PRILOSEC) 20 MG capsule Take 20 mg by mouth every morning.   Suvorexant (BELSOMRA) 5 MG TABS Take 5 mg by mouth at bedtime as needed.   doxazosin (CARDURA) 1 MG tablet Take 1 tablet (1 mg total) by mouth 2 (two) times daily. Needs office visit for further refills. Thank you! (Patient not taking: Reported on 05/22/2021)   No current facility-administered medications for this visit. (Other)   REVIEW OF SYSTEMS: ROS   Positive for: Neurological, Eyes Negative for: Constitutional, Gastrointestinal, Skin, Genitourinary, Musculoskeletal, HENT, Endocrine, Cardiovascular, Respiratory, Psychiatric, Allergic/Imm, Heme/Lymph Last edited  by Theodore Demark, COA on 06/13/2021  2:42 PM.     ALLERGIES Allergies  Allergen Reactions   Tape Other (See Comments)    Paper tape after breast surgery/Burned skin   Tegaderm and other tape OK   PAST MEDICAL HISTORY Past Medical History:  Diagnosis Date   Anxiety    Arthritis    Cancer ([place])    breast left   Depression    Fibromyalgia    GERD (gastroesophageal reflux disease)    Headache    History of kidney stones    h/o   Hypertension    Hypothyroidism    Sleep apnea    DOES NOT USE CPAP   Thyroid disease    Past  Surgical History:  Procedure Laterality Date   APPENDECTOMY     BREAST BIOPSY Left few yrs ago   stereotactic bx in [place], benign   BREAST BIOPSY Left 12/26/2017   Affirm Bx IMC- X-Clip   BREAST BIOPSY Left 01/14/2018   Affirm Bx- Coil clip- path pending   BREAST IMPLANT EXCHANGE Right 02/16/2021   Procedure: PLACEMENT OF BREAST IMPLANT;  Surgeon: Wallace Going, DO;  Location: [place];  Service: Plastics;  Laterality: Right;  1.5 hour   BREAST RECONSTRUCTION WITH PLACEMENT OF TISSUE EXPANDER AND ALLODERM Left 04/08/2018   Procedure: LEFT BREAST IMMEDIATE  RECONSTRUCTION WITH EXPANDER AND FLEX HD;  Surgeon: Wallace Going, DO;  Location: [facility];  Service: Plastics;  Laterality: Left;   COLONOSCOPY WITH PROPOFOL N/A 06/26/2018   Procedure: COLONOSCOPY WITH PROPOFOL;  Surgeon: Virgel Manifold, MD;  Location: [facility];  Service: Endoscopy;  Laterality: N/A;   ESOPHAGOGASTRODUODENOSCOPY (EGD) WITH PROPOFOL N/A 06/26/2018   Procedure: ESOPHAGOGASTRODUODENOSCOPY (EGD) WITH PROPOFOL;  Surgeon: Virgel Manifold, MD;  Location: [facility];  Service: Endoscopy;  Laterality: N/A;   EYE SURGERY Bilateral    lasik   FOOT BONE EXCISION     IMAGE GUIDED SINUS SURGERY  10/220   LIPOSUCTION Bilateral 02/16/2021   Procedure: LIPOSUCTION OF BILATERAL BREAST;  Surgeon: Wallace Going, DO;  Location: [place];  Service: Plastics;  Laterality: Bilateral;   LIPOSUCTION WITH LIPOFILLING Left 04/30/2019   Procedure: Fat grafting to left breast;  Surgeon: Wallace Going, DO;  Location: [place];  Service: Plastics;  Laterality: Left;  90 min, please   MASTECTOMY Left 04/08/2019   MASTECTOMY W/ SENTINEL NODE BIOPSY Left 04/08/2018   Procedure: MASTECTOMY WITH SENTINEL LYMPH NODE BIOPSY LEFT;  Surgeon: Fredirick Maudlin, MD;  Location: [facility];  Service: General;  Laterality: Left;   MASTOPEXY Right 08/04/2018   Procedure:  MASTOPEXY;  Surgeon: Wallace Going, DO;  Location: [facility];  Service: Plastics;  Laterality: Right;  TOTAL CASE TIME SHOULD BE 3 HOURS, PLEASE   REMOVAL OF TISSUE EXPANDER AND PLACEMENT OF IMPLANT Left 08/04/2018   Procedure: REMOVAL OF TISSUE EXPANDER AND PLACEMENT OF IMPLANT;  Surgeon: Wallace Going, DO;  Location: [facility];  Service: Plastics;  Laterality: Left;   SCAR REVISION Left 04/30/2019   Procedure: release of scar contracture to left breast;  Surgeon: Wallace Going, DO;  Location: [place];  Service: Plastics;  Laterality: Left;   TOTAL HIP ARTHROPLASTY Left 02/11/2020   Procedure: TOTAL HIP ARTHROPLASTY ANTERIOR APPROACH;  Surgeon: Hessie Knows, MD;  Location: [facility];  Service: Orthopedics;  Laterality: Left;   UMBILICAL HERNIA REPAIR  1983 and 1985   FAMILY HISTORY Family History  Problem  Relation Age of Onset   Hypertension Mother    Lung cancer Maternal Grandmother    Heart disease Father    Alcohol abuse Father    Breast cancer Neg Hx    Colon cancer Neg Hx    SOCIAL HISTORY Social History   Tobacco Use   Smoking status: Former    Packs/day: 1.00    Years: 2.00    Pack years: 2.00    Types: Cigarettes    Quit date: 01/06/2017    Years since quitting: 4.4   Smokeless tobacco: Never  Vaping Use   Vaping Use: Former   Quit date: 11/27/2016  Substance Use Topics   Alcohol use: Yes    Comment: rare beer    Drug use: Not Currently       OPHTHALMIC EXAM:  Base Eye Exam     Visual Acuity (Snellen - Linear)       Right Left   Dist [place] 20/40 20/25 -2   Dist ph  20/20 20/20         Tonometry (Tonopen, 2:39 PM)       Right Left   Pressure 08 10         Pupils       Dark Light Shape React APD   Right 4 3 Round Brisk None   Left 4 3 Round Brisk None         Visual Fields (Counting fingers)       Left Right    Full Full         Extraocular Movement       Right Left    Full, Ortho Full, Ortho          Neuro/Psych     Oriented x3: Yes   Mood/Affect: Normal         Dilation     Both eyes: 1.0% Mydriacyl, 2.5% Phenylephrine @ 2:39 PM           Slit Lamp and Fundus Exam     Slit Lamp Exam       Right Left   Lids/Lashes dermatochalasis dermatochalasis   Conjunctiva/Sclera whtie and quiet whtie and quiet   Cornea trace PEE, trace EBMD, well healed LASIK flap trace PEE, well healed LASIK flap   Anterior Chamber deep and clear deep and clear   Iris round and dilated round and dilated   Lens 1-2+ NS, 1-2+ cortical 2+ NS, 2+ cortical   Anterior Vitreous PVD, vitreous condensations, mild syneresis mild syneresis, PVD, vitreous condensation         Fundus Exam       Right Left   Disc pink, sharp, tilted, + cupping, temporal PPA pink, sharp, tilted, + cupping, temporal PPA   C/D Ratio 0.75 0.65   Macula Flat, good foveal reflex, mild ERM inferonasal macula Flat, good foveal reflex, mild RPE mottling, no heme or edema   Vessels mild attenuation mild attenuation   Periphery attached, no heme, lattice with retinal tears from 1130 to 12, round operculated hole with pigment ring at 0130, pigmented retinal break at 1000 - good laser changes surrounding all lesions attached, no heme, patch of pigmented lattice from 1130-1200; 2 small tears w/ +SRF from 1200-0100.            IMAGING AND PROCEDURES  Imaging and Procedures for 06/13/2021  OCT, Retina - OU - Both Eyes       Right Eye Quality was good. Central Foveal Thickness: 268 (Focal ERM with pucker  inferonasal macula). Progression has been stable. Findings include normal foveal contour, no IRF, no SRF, macular pucker, epiretinal membrane.   Left Eye Quality was good. Central Foveal Thickness: 266 (Partial PVD). Progression has been stable. Findings include normal foveal contour, no IRF, no SRF (Partial PVD).   Notes *Images captured and stored on drive  Diagnosis / Impression:  OD: NFP; no IRF/SRF; ERM with  macular pucker inferonasal macula OS: NFP; no IRF/SRF  Clinical management:  See below  Abbreviations: NFP - Normal foveal profile. CME - cystoid macular edema. PED - pigment epithelial detachment. IRF - intraretinal fluid. SRF - subretinal fluid. EZ - ellipsoid zone. ERM - epiretinal membrane. ORA - outer retinal atrophy. ORT - outer retinal tubulation. SRHM - subretinal hyper-reflective material. IRHM - intraretinal hyper-reflective material      Repair Retinal Breaks, Laser - OS - Left Eye       LASER PROCEDURE NOTE  Procedure:  Barrier laser retinopexy using slit lamp laser, LEFT eye   Diagnosis:   Lattice degeneration and retinal holes, LEFT eye                     Patch of pigmented lattice from 1130-1200                        2 small retinal breaks w/ +SRF from 1200-0100  Surgeon: Bernarda Caffey, MD, PhD  Anesthesia: Topical  Informed consent obtained, operative eye marked, and time out performed prior to initiation of laser.   Laser settings:  Lumenis [id number] laser, slit lamp Lens: [place] PRP 165 Power: 280 mW Spot size: 200 microns Duration: 30 msec  # spots: 394  Placement of laser: Using a [place] PRP 165 contact lens at the slit lamp, laser was placed in three confluent rows around pigmented lattice and small retinal holes superiorly from 1130-0100 oclock anterior to equator.  Complications: None.  Patient tolerated the procedure well and received written and verbal post-procedure care information/education.            ASSESSMENT/PLAN:    ICD-10-CM   1. Bilateral retinal lattice degeneration  H35.413 Repair Retinal Breaks, Laser - OS - Left Eye    2. Retinal hole of both eyes  H33.323 Repair Retinal Breaks, Laser - OS - Left Eye    3. Epiretinal membrane (ERM) of right eye  H35.371 OCT, Retina - OU - Both Eyes    4. Essential hypertension  I10     5. Hypertensive retinopathy of both eyes  H35.033     6. Combined forms of age-related cataract  of both eyes  H25.813     7. Hx of LASIK  Z98.890       1,2. Lattice degeneration OU with retinal holes OU  - OD: Patch of lattice w/ breaks from 1130-1200; pigmented hole at 0130; pigmented break at 1000  - OS: pigmented lattice from 1130 to 1200; 2 small tears w/ +SRF from 1200-0100  - s/p laser retinopexy OD (05.22.23) -- good laser changes surrounding all lesions - recommend laser retinopexy OS today, 06.06.23 - pt wishes to proceed with laser OS - RBA of procedure discussed, questions answered - informed consent obtained and signed - see procedure note - start Lotemax SM QID OS x7 days - f/u in 3-4 weeks -- DFE/OCT  3. Epiretinal membrane, right eye  - mild ERM w/ pucker -- inf nasal mac, noncentral - BCVA 20/20 - asymptomatic, no metamorphopsia - no indication  for surgery at this time - monitor for now  4,5. Hypertensive retinopathy OU - discussed importance of tight BP control - monitor  6. Age related cataracts OU   - The symptoms of cataract, surgical options, and treatments and risks were discussed with patient. - discussed diagnosis and progression - not yet visually significant - monitor  7. History of LASIK OU   Ophthalmic Meds Ordered this visit:  No orders of the defined types were placed in this encounter.    Return for f/u 3-4 weeks, lattice degeneration OU, DFE, OCT.  There are no Patient Instructions on file for this visit.   Explained the diagnoses, plan, and follow up with the patient and they expressed understanding.  Patient expressed understanding of the importance of proper follow up care.   This document serves as a record of services personally performed by Gardiner Sleeper, MD, PhD. It was created on their behalf by Bernarda Caffey, MD, an ophthalmic technician. The creation of this record is the provider's dictation and/or activities during the visit.    Electronically signed by: Bernarda Caffey, MD 06/01/21 5:01 PM   Gardiner Sleeper, M.D.,  Ph.D. Diseases & Surgery of the Retina and Vitreous [facility]  I have reviewed the above documentation for accuracy and completeness, and I agree with the above. Gardiner Sleeper, M.D., Ph.D. 06/13/21 5:07 PM  Abbreviations: M myopia (nearsighted); A astigmatism; H hyperopia (farsighted); P presbyopia; Mrx spectacle prescription;  CTL contact lenses; OD right eye; OS left eye; OU both eyes  XT exotropia; ET esotropia; PEK punctate epithelial keratitis; PEE punctate epithelial erosions; DES dry eye syndrome; MGD meibomian gland dysfunction; ATs artificial tears; PFAT's preservative free artificial tears; [place] nuclear sclerotic cataract; PSC posterior subcapsular cataract; ERM epi-retinal membrane; PVD posterior vitreous detachment; RD retinal detachment; DM diabetes mellitus; DR diabetic retinopathy; NPDR non-proliferative diabetic retinopathy; PDR proliferative diabetic retinopathy; CSME clinically significant macular edema; DME diabetic macular edema; dbh dot blot hemorrhages; CWS cotton wool spot; POAG primary open angle glaucoma; C/D cup-to-disc ratio; HVF humphrey visual field; GVF goldmann visual field; OCT optical coherence tomography; IOP intraocular pressure; BRVO Branch retinal vein occlusion; CRVO central retinal vein occlusion; CRAO central retinal artery occlusion; BRAO branch retinal artery occlusion; RT retinal tear; SB scleral buckle; PPV pars plana vitrectomy; VH Vitreous hemorrhage; PRP panretinal laser photocoagulation; IVK intravitreal kenalog; VMT vitreomacular traction; MH Macular hole;  NVD neovascularization of the disc; NVE neovascularization elsewhere; AREDS age related eye disease study; ARMD age related macular degeneration; POAG primary open angle glaucoma; EBMD epithelial/anterior basement membrane dystrophy; ACIOL anterior chamber intraocular lens; IOL intraocular lens; PCIOL posterior chamber intraocular lens; Phaco/IOL phacoemulsification with intraocular  lens placement; [place] photorefractive keratectomy; LASIK laser assisted in situ keratomileusis; HTN hypertension; DM diabetes mellitus; COPD chronic obstructive pulmonary disease

## 2021-06-13 ENCOUNTER — Encounter (INDEPENDENT_AMBULATORY_CARE_PROVIDER_SITE_OTHER): Payer: Self-pay | Admitting: Ophthalmology

## 2021-06-13 ENCOUNTER — Ambulatory Visit (INDEPENDENT_AMBULATORY_CARE_PROVIDER_SITE_OTHER): Payer: Medicare Other | Admitting: Ophthalmology

## 2021-06-13 DIAGNOSIS — H33323 Round hole, bilateral: Secondary | ICD-10-CM

## 2021-06-13 DIAGNOSIS — H35371 Puckering of macula, right eye: Secondary | ICD-10-CM | POA: Diagnosis not present

## 2021-06-13 DIAGNOSIS — H25813 Combined forms of age-related cataract, bilateral: Secondary | ICD-10-CM

## 2021-06-13 DIAGNOSIS — Z9889 Other specified postprocedural states: Secondary | ICD-10-CM

## 2021-06-13 DIAGNOSIS — H35033 Hypertensive retinopathy, bilateral: Secondary | ICD-10-CM

## 2021-06-13 DIAGNOSIS — H35413 Lattice degeneration of retina, bilateral: Secondary | ICD-10-CM | POA: Diagnosis not present

## 2021-06-13 DIAGNOSIS — I10 Essential (primary) hypertension: Secondary | ICD-10-CM

## 2021-06-22 ENCOUNTER — Encounter: Payer: Self-pay | Admitting: Psychiatry

## 2021-06-22 ENCOUNTER — Telehealth (INDEPENDENT_AMBULATORY_CARE_PROVIDER_SITE_OTHER): Payer: Medicare Other | Admitting: Psychiatry

## 2021-06-22 DIAGNOSIS — F4310 Post-traumatic stress disorder, unspecified: Secondary | ICD-10-CM | POA: Diagnosis not present

## 2021-06-22 DIAGNOSIS — G4701 Insomnia due to medical condition: Secondary | ICD-10-CM

## 2021-06-22 DIAGNOSIS — F3342 Major depressive disorder, recurrent, in full remission: Secondary | ICD-10-CM | POA: Diagnosis not present

## 2021-06-22 DIAGNOSIS — F410 Panic disorder [episodic paroxysmal anxiety] without agoraphobia: Secondary | ICD-10-CM | POA: Diagnosis not present

## 2021-06-22 DIAGNOSIS — F424 Excoriation (skin-picking) disorder: Secondary | ICD-10-CM

## 2021-06-22 DIAGNOSIS — F4010 Social phobia, unspecified: Secondary | ICD-10-CM

## 2021-06-22 MED ORDER — DULOXETINE HCL 30 MG PO CPEP: 30.0000 mg | ORAL_CAPSULE | Freq: Every day | ORAL | 0 refills | Status: DC

## 2021-06-22 MED ORDER — BELSOMRA 10 MG PO TABS: 10.0000 mg | ORAL_TABLET | Freq: Every day | ORAL | 0 refills | Status: DC

## 2021-06-22 NOTE — Progress Notes (Signed)
Virtual Visit via Video Note  I connected with Kelli Logan on 06/22/21 at  4:20 PM EDT by a video enabled telemedicine application and verified that I am speaking with the correct person using two identifiers.  Location Provider Location : [facility] Patient Location : Home  Participants: Patient , Provider   I discussed the limitations of evaluation and management by telemedicine and the availability of in person appointments. The patient expressed understanding and agreed to proceed.  I discussed the assessment and treatment plan with the patient. The patient was provided an opportunity to ask questions and all were answered. The patient agreed with the plan and demonstrated an understanding of the instructions.   The patient was advised to call back or seek an in-person evaluation if the symptoms worsen or if the condition fails to improve as anticipated.   [facility] MD OP Progress Note  06/22/2021 6:43 PM Kelli Logan  MRN:  [id number]  Chief Complaint:  Chief Complaint  Patient presents with   Follow-up: 61 year old Caucasian female with history of depression, anxiety, skin picking disorder, PTSD, borderline personality disorder, presented for medication management.    HPI: Kelli Logan is a 61 year old Caucasian female, divorced, lives in [place], has a history of skin picking disorder, PTSD, social anxiety, history of borderline personality disorder, cognitive disorder, breast cancer per history, fibromyalgia, total hip replacement, obstructive sleep apnea on CPAP was evaluated by telemedicine today.  Patient reports she misplaced her Belsomra 5 mg after taking it for a few days.  She reports when she took it it may have helped to some extent.  She denies any side effects.  She has started using CPAP , reports she has to get used to it.  Motivated to do so.  Reports overall mood symptoms are stable.  Currently compliant on medications.  Denies side effects.  She however  does report having skin picking, chronic comes and goes she believes it is related to her eating sugary food which triggers it.  She usually picks on her lower extremities.  Aware that she needs to be in psychotherapy for the same however cannot afford it now.  Denies any suicidality, homicidality or perceptual disturbances.  Patient denies any other concerns today.  Visit Diagnosis:    ICD-10-CM   1. PTSD (post-traumatic stress disorder)  F43.10 Suvorexant (BELSOMRA) 10 MG TABS    DULoxetine (CYMBALTA) 30 MG capsule    2. Skin-picking disorder  F42.4 DULoxetine (CYMBALTA) 30 MG capsule    3. MDD (major depressive disorder), recurrent, in full remission ([place])  F33.42 DULoxetine (CYMBALTA) 30 MG capsule    4. Panic disorder  F41.0 DULoxetine (CYMBALTA) 30 MG capsule    5. Social anxiety disorder  F40.10 DULoxetine (CYMBALTA) 30 MG capsule    6. Insomnia due to medical condition  G47.01    OSA, mood      Past Psychiatric History: Reviewed past psychiatric history from progress note on 03/10/2018.  Past Medical History:  Past Medical History:  Diagnosis Date   Anxiety    Arthritis    Cancer ([place])    breast left   Depression    Fibromyalgia    GERD (gastroesophageal reflux disease)    Headache    History of kidney stones    h/o   Hypertension    Hypothyroidism    Sleep apnea    DOES NOT USE CPAP   Thyroid disease     Past Surgical History:  Procedure Laterality Date   APPENDECTOMY  BREAST BIOPSY Left few yrs ago   stereotactic bx in [place], benign   BREAST BIOPSY Left 12/26/2017   Affirm Bx IMC- X-Clip   BREAST BIOPSY Left 01/14/2018   Affirm Bx- Coil clip- path pending   BREAST IMPLANT EXCHANGE Right 02/16/2021   Procedure: PLACEMENT OF BREAST IMPLANT;  Surgeon: Wallace Going, DO;  Location: [facility];  Service: Plastics;  Laterality: Right;  1.5 hour   BREAST RECONSTRUCTION WITH PLACEMENT OF TISSUE EXPANDER AND ALLODERM Left 04/08/2018    Procedure: LEFT BREAST IMMEDIATE  RECONSTRUCTION WITH EXPANDER AND FLEX HD;  Surgeon: Wallace Going, DO;  Location: [facility];  Service: Plastics;  Laterality: Left;   COLONOSCOPY WITH PROPOFOL N/A 06/26/2018   Procedure: COLONOSCOPY WITH PROPOFOL;  Surgeon: Virgel Manifold, MD;  Location: [facility];  Service: Endoscopy;  Laterality: N/A;   ESOPHAGOGASTRODUODENOSCOPY (EGD) WITH PROPOFOL N/A 06/26/2018   Procedure: ESOPHAGOGASTRODUODENOSCOPY (EGD) WITH PROPOFOL;  Surgeon: Virgel Manifold, MD;  Location: [facility];  Service: Endoscopy;  Laterality: N/A;   EYE SURGERY Bilateral    lasik   FOOT BONE EXCISION     IMAGE GUIDED SINUS SURGERY  10/220   LIPOSUCTION Bilateral 02/16/2021   Procedure: LIPOSUCTION OF BILATERAL BREAST;  Surgeon: Wallace Going, DO;  Location: [facility];  Service: Plastics;  Laterality: Bilateral;   LIPOSUCTION WITH LIPOFILLING Left 04/30/2019   Procedure: Fat grafting to left breast;  Surgeon: Wallace Going, DO;  Location: [facility];  Service: Plastics;  Laterality: Left;  90 min, please   MASTECTOMY Left 04/08/2019   MASTECTOMY W/ SENTINEL NODE BIOPSY Left 04/08/2018   Procedure: MASTECTOMY WITH SENTINEL LYMPH NODE BIOPSY LEFT;  Surgeon: Fredirick Maudlin, MD;  Location: [facility];  Service: General;  Laterality: Left;   MASTOPEXY Right 08/04/2018   Procedure: MASTOPEXY;  Surgeon: Wallace Going, DO;  Location: [facility];  Service: Plastics;  Laterality: Right;  TOTAL CASE TIME SHOULD BE 3 HOURS, PLEASE   REMOVAL OF TISSUE EXPANDER AND PLACEMENT OF IMPLANT Left 08/04/2018   Procedure: REMOVAL OF TISSUE EXPANDER AND PLACEMENT OF IMPLANT;  Surgeon: Wallace Going, DO;  Location: [facility];  Service: Plastics;  Laterality: Left;   SCAR REVISION Left 04/30/2019   Procedure: release of scar contracture to left breast;  Surgeon: Wallace Going, DO;  Location: [facility];  Service: Plastics;   Laterality: Left;   TOTAL HIP ARTHROPLASTY Left 02/11/2020   Procedure: TOTAL HIP ARTHROPLASTY ANTERIOR APPROACH;  Surgeon: Hessie Knows, MD;  Location: [facility];  Service: Orthopedics;  Laterality: Left;   UMBILICAL HERNIA REPAIR  1983 and 1985    Family Psychiatric History: Reviewed family psychiatric history from progress note on 03/10/2018.  Family History:  Family History  Problem Relation Age of Onset   Hypertension Mother    Lung cancer Maternal Grandmother    Heart disease Father    Alcohol abuse Father    Breast cancer Neg Hx    Colon cancer Neg Hx     Social History: Reviewed social history from progress note on 03/10/2018. Social History   Socioeconomic History   Marital status: Divorced    Spouse name: Not on file   Number of children: 1   Years of education: Not on file   Highest education level: High school graduate  Occupational History   Not on file  Tobacco Use   Smoking status: Former    Packs/day: 1.00    Years: 2.00  Total pack years: 2.00    Types: Cigarettes    Quit date: 01/06/2017    Years since quitting: 4.4   Smokeless tobacco: Never  Vaping Use   Vaping Use: Former   Quit date: 11/27/2016  Substance and Sexual Activity   Alcohol use: Yes    Comment: rare beer    Drug use: Not Currently   Sexual activity: Not on file  Other Topics Concern   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 Quit smoking; ocassional alcohol.    Social Determinants of Health   Financial Resource Strain: High Risk (12/31/2019)   Overall Financial Resource Strain (CARDIA)    Difficulty of Paying Living Expenses: Very hard  Food Insecurity: Food Insecurity Present (12/31/2019)   Hunger Vital Sign    Worried About Running Out of Food in the Last Year: Often true    Ran Out of Food in the Last Year: Often true  Transportation Needs: Unmet  Transportation Needs (03/10/2018)   PRAPARE - Hydrologist (Medical): Yes    Lack of Transportation (Non-Medical): Yes  Physical Activity: Inactive (03/10/2018)   Exercise Vital Sign    Days of Exercise per Week: 0 days    Minutes of Exercise per Session: 0 min  Stress: Stress Concern Present (12/31/2019)   [place]    Feeling of Stress : Very much  Social Connections: Unknown (03/10/2018)   Social Connection and Isolation Panel [NHANES]    Frequency of Communication with Friends and Family: Not on file    Frequency of Social Gatherings with Friends and Family: Not on file    Attends Religious Services: Never    Marine scientist or Organizations: No    Attends Archivist Meetings: Never    Marital Status: Divorced    Allergies:  Allergies  Allergen Reactions   Tape Other (See Comments)    Paper tape after breast surgery/Burned skin   Tegaderm and other tape OK    Metabolic Disorder Labs: No results found for: "HGBA1C", "MPG" No results found for: "PROLACTIN" No results found for: "CHOL", "TRIG", "HDL", "CHOLHDL", "VLDL", "LDLCALC" No results found for: "TSH"  Therapeutic Level Labs: No results found for: "LITHIUM" No results found for: "VALPROATE" No results found for: "CBMZ"  Current Medications: Current Outpatient Medications  Medication Sig Dispense Refill   atorvastatin (LIPITOR) 20 MG tablet Take 20 mg by mouth at bedtime.     Coenzyme Q10 (COQ10) 100 MG CAPS Take 100 mg by mouth every evening.     hydrochlorothiazide (HYDRODIURIL) 25 MG tablet Take 25 mg by mouth daily.      hydrOXYzine (VISTARIL) 50 MG capsule Take 1 capsule (50 mg total) by mouth daily as needed. For sleep, anxiety 90 capsule 1   ipratropium (ATROVENT) 0.06 % nasal spray SMARTSIG:2 Spray(s) Both Nares 4 Times Daily PRN     levothyroxine (SYNTHROID) 75 MCG tablet Take 75 mcg by mouth every  other day. Alternating with 50 mcg every other day-AM     levothyroxine (SYNTHROID, LEVOTHROID) 50 MCG tablet Take 50 mcg by mouth every other day. Alternating with 75 mcg every other day-AM     losartan (COZAAR) 100 MG tablet Take 100 mg by mouth daily. for high blood pressure     methocarbamol (ROBAXIN) 500 MG tablet Take 250-500 mg by mouth at  bedtime as needed.     metoprolol (LOPRESSOR) 50 MG tablet Take 1 tablet (50 mg total) by mouth 2 (two) times daily. 60 tablet 0   mirtazapine (REMERON) 15 MG tablet Take 1 tablet (15 mg total) by mouth at bedtime. FOR SLEEP 90 tablet 0   Multiple Vitamin (MULTIVITAMIN WITH MINERALS) TABS tablet Take 1 tablet by mouth daily.     NONFORMULARY OR COMPOUNDED ITEM 1 Device by Other route as directed. CPAP-with the pressure setting of 10 cm H20 with heated humidity and ResMed AirFit F20 Full Face Mask , size small. 1 each 0   Omega-3 Fatty Acids (FISH OIL PO) Take 1 capsule by mouth daily.     omeprazole (PRILOSEC) 20 MG capsule Take 20 mg by mouth every morning.     Suvorexant (BELSOMRA) 10 MG TABS Take 10 mg by mouth at bedtime. 30 tablet 0   doxazosin (CARDURA) 1 MG tablet Take 1 tablet (1 mg total) by mouth 2 (two) times daily. Needs office visit for further refills. Thank you! (Patient not taking: Reported on 06/22/2021) 60 tablet 0   DULoxetine (CYMBALTA) 30 MG capsule Take 1 capsule (30 mg total) by mouth daily. 90 capsule 0   No current facility-administered medications for this visit.     Musculoskeletal: Strength & Muscle Tone:  UTA Gait & Station:  Seated Patient leans: N/A  Psychiatric Specialty Exam: Review of Systems  Psychiatric/Behavioral:  Positive for sleep disturbance.   All other systems reviewed and are negative.   Last menstrual period 02/05/2016.There is no height or weight on file to calculate BMI.  General Appearance: Casual  Eye Contact:  Fair  Speech:  Normal Rate  Volume:  Normal  Mood:  Euthymic  Affect:  Congruent   Thought Process:  Goal Directed and Descriptions of Associations: Intact  Orientation:  Full (Time, Place, and Person)  Thought Content: Logical   Suicidal Thoughts:  No  Homicidal Thoughts:  No  Memory:  Immediate;   Fair Recent;   Fair Remote;   Fair  Judgement:  Fair  Insight:  Fair  Psychomotor Activity:  Normal  Concentration:  Concentration: Fair and Attention Span: Fair  Recall:  [employer] of Knowledge: Fair  Language: Fair  Akathisia:  No  Handed:  Right  AIMS (if indicated): done  Assets:  Communication Skills Desire for Improvement Housing Social Support  ADL's:  Intact  Cognition: WNL  Sleep:  Poor   Screenings: AIMS    Flowsheet Row Video Visit from 06/22/2021 in [facility] Video Visit from 05/22/2021 in [facility] Total Score 0 0      GAD-7    [place] Visit from 08/30/2020 in [facility]  Total GAD-7 Score 9      PHQ2-9    [place] Video Visit from 06/22/2021 in [facility] Video Visit from 05/22/2021 in [facility] from 12/05/2020 in [facility] from 09/13/2020 in [facility] Visit from 08/30/2020 in [facility]  PHQ-2 Total Score 0 0 0 0 1  PHQ-9 Total Score -- -- -- -- 11      Flowsheet Row Video Visit from 06/22/2021 in [facility] Video Visit from 05/22/2021 in [facility] Admission (Discharged) from 02/16/2021 in [facility] No Risk No Risk        Assessment and Plan:  Kelli Logan is a 61 year old Caucasian female, divorced, lives in [place], has a history of PTSD, skin picking disorder, panic disorder, social anxiety disorder, multiple medical problems was evaluated by  telemedicine today.  Patient is currently struggling with sleep problems.  Currently compliant on CPAP for OSA.  Discussed plan as noted below.  Plan PTSD-stable Mirtazapine 15 mg p.o. nightly Continue CBT  Skin picking disorder-unstable Discussed referral back for CBT-patient is not willing to do so. She reports it is triggered by her diet.  She wants to work on her diet at this time.  MDD in remission Cymbalta 30 mg p.o. daily  Panic disorder-stable Cymbalta as prescribed  Social anxiety disorder-stable Mirtazapine as prescribed Discontinue hydroxyzine for noncompliance  Insomnia-unstable Increase Belsomra to 10 mg p.o. nightly Encourage compliance on CPAP for OSA.  Follow-up in clinic in 4 weeks or sooner if needed.    Collaboration of Care: Collaboration of Care: Referral or follow-up with counselor/therapist [facility] discussed establishing care with a therapist.  Patient/Guardian was advised Release of Information must be obtained prior to any record release in order to collaborate their care with an outside provider. Patient/Guardian was advised if they have not already done so to contact the registration department to sign all necessary forms in order for [place] to release information regarding their care.   Consent: Patient/Guardian gives verbal consent for treatment and assignment of benefits for services provided during this visit. Patient/Guardian expressed understanding and agreed to proceed.   This note was generated in part or whole with voice recognition software. Voice recognition is usually quite accurate but there are transcription errors that can and very often do occur. I apologize for any typographical errors that were not detected and corrected.      Ursula Alert, MD 06/22/2021, 6:43 PM

## 2021-07-07 NOTE — Progress Notes (Signed)
[facility] Note  07/10/2021     CHIEF COMPLAINT Patient presents for Retina Follow Up   HISTORY OF PRESENT ILLNESS: Kelli Logan is a 61 y.o. female who presents to the clinic today for:   HPI     Retina Follow Up   Patient presents with  Other.  In both eyes.  This started 4 weeks ago.  I, the attending physician,  performed the HPI with the patient and updated documentation appropriately.        Comments   Patient here for 4 weeks retina follow up for lattice deg OU s/p laser OD (5.22.23) and OS (6.06.23). Patient states vision no change. No eye pain.       Last edited by Bernarda Caffey, MD on 07/12/2021 11:58 PM.    Patient states no new problems, no changes.   Referring physician: Anell Barr, [place],  [place] [address]  HISTORICAL INFORMATION:   Selected notes from the MEDICAL RECORD NUMBER Referred by Dr. Anell Barr for retinal breaks LEE:  Ocular Hx- PMH-    CURRENT MEDICATIONS: No current outpatient medications on file. (Ophthalmic Drugs)   No current facility-administered medications for this visit. (Ophthalmic Drugs)   Current Outpatient Medications (Other)  Medication Sig   atorvastatin (LIPITOR) 20 MG tablet Take 20 mg by mouth at bedtime.   Coenzyme Q10 (COQ10) 100 MG CAPS Take 100 mg by mouth every evening.   DULoxetine (CYMBALTA) 30 MG capsule Take 1 capsule (30 mg total) by mouth daily.   hydrochlorothiazide (HYDRODIURIL) 25 MG tablet Take 25 mg by mouth daily.    hydrOXYzine (VISTARIL) 50 MG capsule Take 1 capsule (50 mg total) by mouth daily as needed. For sleep, anxiety   ipratropium (ATROVENT) 0.06 % nasal spray SMARTSIG:2 Spray(s) Both Nares 4 Times Daily PRN   levothyroxine (SYNTHROID, LEVOTHROID) 50 MCG tablet Take 50 mcg by mouth every other day. Alternating with 75 mcg every other day-AM   losartan (COZAAR) 100 MG tablet Take 100 mg by mouth daily. for high blood pressure    methocarbamol (ROBAXIN) 500 MG tablet Take 250-500 mg by mouth at bedtime as needed.   metoprolol (LOPRESSOR) 50 MG tablet Take 1 tablet (50 mg total) by mouth 2 (two) times daily.   mirtazapine (REMERON) 15 MG tablet Take 1 tablet (15 mg total) by mouth at bedtime. FOR SLEEP   Multiple Vitamin (MULTIVITAMIN WITH MINERALS) TABS tablet Take 1 tablet by mouth daily.   NONFORMULARY OR COMPOUNDED ITEM 1 Device by Other route as directed. CPAP-with the pressure setting of 10 cm H20 with heated humidity and ResMed AirFit F20 Full Face Mask , size small.   Omega-3 Fatty Acids (FISH OIL PO) Take 1 capsule by mouth daily.   omeprazole (PRILOSEC) 20 MG capsule Take 20 mg by mouth every morning.   Suvorexant (BELSOMRA) 10 MG TABS Take 10 mg by mouth at bedtime.   doxazosin (CARDURA) 1 MG tablet Take 1 tablet (1 mg total) by mouth 2 (two) times daily. Needs office visit for further refills. Thank you! (Patient not taking: Reported on 06/22/2021)   levothyroxine (SYNTHROID) 75 MCG tablet Take 75 mcg by mouth every other day. Alternating with 50 mcg every other day-AM   No current facility-administered medications for this visit. (Other)   REVIEW OF SYSTEMS: ROS   Positive for: Neurological, Eyes Negative for: Constitutional, Gastrointestinal, Skin, Genitourinary, Musculoskeletal, HENT, Endocrine, Cardiovascular, Respiratory, Psychiatric, Allergic/Imm, Heme/Lymph Last edited  by Theodore Demark, COA on 07/10/2021  2:55 PM.     ALLERGIES Allergies  Allergen Reactions   Tape Other (See Comments)    Paper tape after breast surgery/Burned skin   Tegaderm and other tape OK   PAST MEDICAL HISTORY Past Medical History:  Diagnosis Date   Anxiety    Arthritis    Cancer ([place])    breast left   Depression    Fibromyalgia    GERD (gastroesophageal reflux disease)    Headache    History of kidney stones    h/o   Hypertension    Hypothyroidism    Sleep apnea    DOES NOT USE CPAP   Thyroid disease     Past Surgical History:  Procedure Laterality Date   APPENDECTOMY     BREAST BIOPSY Left few yrs ago   stereotactic bx in [place], benign   BREAST BIOPSY Left 12/26/2017   Affirm Bx IMC- X-Clip   BREAST BIOPSY Left 01/14/2018   Affirm Bx- Coil clip- path pending   BREAST IMPLANT EXCHANGE Right 02/16/2021   Procedure: PLACEMENT OF BREAST IMPLANT;  Surgeon: Wallace Going, DO;  Location: [place];  Service: Plastics;  Laterality: Right;  1.5 hour   BREAST RECONSTRUCTION WITH PLACEMENT OF TISSUE EXPANDER AND ALLODERM Left 04/08/2018   Procedure: LEFT BREAST IMMEDIATE  RECONSTRUCTION WITH EXPANDER AND FLEX HD;  Surgeon: Wallace Going, DO;  Location: [facility];  Service: Plastics;  Laterality: Left;   COLONOSCOPY WITH PROPOFOL N/A 06/26/2018   Procedure: COLONOSCOPY WITH PROPOFOL;  Surgeon: Virgel Manifold, MD;  Location: [facility];  Service: Endoscopy;  Laterality: N/A;   ESOPHAGOGASTRODUODENOSCOPY (EGD) WITH PROPOFOL N/A 06/26/2018   Procedure: ESOPHAGOGASTRODUODENOSCOPY (EGD) WITH PROPOFOL;  Surgeon: Virgel Manifold, MD;  Location: [facility];  Service: Endoscopy;  Laterality: N/A;   EYE SURGERY Bilateral    lasik   FOOT BONE EXCISION     IMAGE GUIDED SINUS SURGERY  10/220   LIPOSUCTION Bilateral 02/16/2021   Procedure: LIPOSUCTION OF BILATERAL BREAST;  Surgeon: Wallace Going, DO;  Location: [place];  Service: Plastics;  Laterality: Bilateral;   LIPOSUCTION WITH LIPOFILLING Left 04/30/2019   Procedure: Fat grafting to left breast;  Surgeon: Wallace Going, DO;  Location: [place];  Service: Plastics;  Laterality: Left;  90 min, please   MASTECTOMY Left 04/08/2019   MASTECTOMY W/ SENTINEL NODE BIOPSY Left 04/08/2018   Procedure: MASTECTOMY WITH SENTINEL LYMPH NODE BIOPSY LEFT;  Surgeon: Fredirick Maudlin, MD;  Location: [facility];  Service: General;  Laterality: Left;   MASTOPEXY Right 08/04/2018   Procedure:  MASTOPEXY;  Surgeon: Wallace Going, DO;  Location: [facility];  Service: Plastics;  Laterality: Right;  TOTAL CASE TIME SHOULD BE 3 HOURS, PLEASE   REMOVAL OF TISSUE EXPANDER AND PLACEMENT OF IMPLANT Left 08/04/2018   Procedure: REMOVAL OF TISSUE EXPANDER AND PLACEMENT OF IMPLANT;  Surgeon: Wallace Going, DO;  Location: [facility];  Service: Plastics;  Laterality: Left;   SCAR REVISION Left 04/30/2019   Procedure: release of scar contracture to left breast;  Surgeon: Wallace Going, DO;  Location: [place];  Service: Plastics;  Laterality: Left;   TOTAL HIP ARTHROPLASTY Left 02/11/2020   Procedure: TOTAL HIP ARTHROPLASTY ANTERIOR APPROACH;  Surgeon: Hessie Knows, MD;  Location: [facility];  Service: Orthopedics;  Laterality: Left;   UMBILICAL HERNIA REPAIR  1983 and 1985   FAMILY HISTORY Family History  Problem  Relation Age of Onset   Hypertension Mother    Lung cancer Maternal Grandmother    Heart disease Father    Alcohol abuse Father    Breast cancer Neg Hx    Colon cancer Neg Hx    SOCIAL HISTORY Social History   Tobacco Use   Smoking status: Former    Packs/day: 1.00    Years: 2.00    Total pack years: 2.00    Types: Cigarettes    Quit date: 01/06/2017    Years since quitting: 4.5   Smokeless tobacco: Never  Vaping Use   Vaping Use: Former   Quit date: 11/27/2016  Substance Use Topics   Alcohol use: Yes    Comment: rare beer    Drug use: Not Currently       OPHTHALMIC EXAM:  Base Eye Exam     Visual Acuity (Snellen - Linear)       Right Left   Dist [place] 20/40 20/25 -2   Dist ph [place] 20/20 20/20         Tonometry (Tonopen, 2:53 PM)       Right Left   Pressure 12 11         Pupils       Dark Light Shape React APD   Right 4 3 Round Brisk None   Left 4 3 Round Brisk None         Visual Fields (Counting fingers)       Left Right    Full Full         Extraocular Movement       Right Left    Full, Ortho Full,  Ortho         Neuro/Psych     Oriented x3: Yes   Mood/Affect: Normal         Dilation     Both eyes: 1.0% Mydriacyl, 2.5% Phenylephrine @ 2:53 PM           Slit Lamp and Fundus Exam     Slit Lamp Exam       Right Left   Lids/Lashes dermatochalasis dermatochalasis   Conjunctiva/Sclera whtie and quiet whtie and quiet   Cornea trace PEE, trace EBMD, well healed LASIK flap trace PEE, well healed LASIK flap   Anterior Chamber deep and clear deep and clear   Iris round and dilated round and dilated   Lens 1-2+ NS, 1-2+ cortical 2+ NS, 2+ cortical   Anterior Vitreous PVD, vitreous condensations, mild syneresis mild syneresis, PVD, vitreous condensation         Fundus Exam       Right Left   Disc pink, sharp, tilted, + cupping, temporal PPA pink, sharp, tilted, + cupping, temporal PPA   C/D Ratio 0.75 0.65   Macula Flat, good foveal reflex, mild ERM inferonasal macula Flat, good foveal reflex, mild RPE mottling, no heme or edema   Vessels mild attenuation mild attenuation   Periphery attached, no heme, lattice with retinal tears from 1130 to 1200, round operculated hole with pigment ring at 0130, pigmented retinal break at 1000 - good laser changes surrounding all lesions, no new RT/RD/lattice attached, no heme, patch of pigmented lattice from 1130-1200; 2 small tears w/ +SRF from 1200-0100, good laser surrounding all lesions, no new RT/RD/lattice           IMAGING AND PROCEDURES  Imaging and Procedures for 07/10/2021  OCT, Retina - OU - Both Eyes       Right Eye  Quality was good. Central Foveal Thickness: 272. Progression has been stable. Findings include normal foveal contour, no IRF, no SRF, epiretinal membrane, macular pucker (Focal ERM with pucker inferonasal macula -- stable).   Left Eye Quality was good. Central Foveal Thickness: 269 (Partial PVD). Progression has been stable. Findings include normal foveal contour, no IRF, no SRF (Partial PVD).    Notes *Images captured and stored on drive  Diagnosis / Impression:  OD: NFP; no IRF/SRF; ERM with macular pucker inferonasal macula -- stable OS: NFP; no IRF/SRF  Clinical management:  See below  Abbreviations: NFP - Normal foveal profile. CME - cystoid macular edema. PED - pigment epithelial detachment. IRF - intraretinal fluid. SRF - subretinal fluid. EZ - ellipsoid zone. ERM - epiretinal membrane. ORA - outer retinal atrophy. ORT - outer retinal tubulation. SRHM - subretinal hyper-reflective material. IRHM - intraretinal hyper-reflective material            ASSESSMENT/PLAN:    ICD-10-CM   1. Bilateral retinal lattice degeneration  H35.413     2. Retinal hole of both eyes  H33.323     3. Epiretinal membrane (ERM) of right eye  H35.371 OCT, Retina - OU - Both Eyes    4. Essential hypertension  I10     5. Hypertensive retinopathy of both eyes  H35.033     6. Combined forms of age-related cataract of both eyes  H25.813     7. Hx of LASIK  Z98.890       1,2. Lattice degeneration OU with retinal holes OU  - OD: Patch of lattice w/ breaks from 1130-1200; pigmented hole at 0130; pigmented break at 1000  - OS: pigmented lattice from 1130 to 1200; 2 small tears w/ +SRF from 1200-0100  - s/p laser retinopexy OD (05.22.23) -- good laser changes surrounding all lesions - s/p laser retinopexy OS (06.06.23) -- good laser changes surrounding all lesions - completed Lotemax SM QID OU x7 days - f/u in 3 months -- DFE/OCT  3. Epiretinal membrane, right eye  - mild ERM w/ pucker -- inf nasal mac, noncentral - BCVA 20/20 - asymptomatic, no metamorphopsia - no indication for surgery at this time - monitor for now  4,5. Hypertensive retinopathy OU - discussed importance of tight BP control - monitor  6. Age related cataracts OU   - The symptoms of cataract, surgical options, and treatments and risks were discussed with patient. - discussed diagnosis and progression - not  yet visually significant - monitor  7. History of LASIK OU   Ophthalmic Meds Ordered this visit:  No orders of the defined types were placed in this encounter.    Return in about 3 months (around 10/10/2021) for DFE, OCT.  There are no Patient Instructions on file for this visit.   Explained the diagnoses, plan, and follow up with the patient and they expressed understanding.  Patient expressed understanding of the importance of proper follow up care.   This document serves as a record of services personally performed by Gardiner Sleeper, MD, PhD. It was created on their behalf by San Jetty. Owens Shark, OA an ophthalmic technician. The creation of this record is the provider's dictation and/or activities during the visit.    Electronically signed by: San Jetty. Owens Shark, [place] 06.30.2023 12:10 AM  This document serves as a record of services personally performed by Gardiner Sleeper, MD, PhD. It was created on their behalf by Leonie Douglas, an ophthalmic technician. The creation of this record is the  provider's dictation and/or activities during the visit.    Electronically signed by: Leonie Douglas COA, 07/13/21  12:10 AM   Gardiner Sleeper, M.D., Ph.D. Diseases & Surgery of the Retina and Vitreous [facility]  I have reviewed the above documentation for accuracy and completeness, and I agree with the above. Gardiner Sleeper, M.D., Ph.D. 07/13/21 12:10 AM   Abbreviations: M myopia (nearsighted); A astigmatism; H hyperopia (farsighted); P presbyopia; Mrx spectacle prescription;  CTL contact lenses; OD right eye; OS left eye; OU both eyes  XT exotropia; ET esotropia; PEK punctate epithelial keratitis; PEE punctate epithelial erosions; DES dry eye syndrome; MGD meibomian gland dysfunction; ATs artificial tears; PFAT's preservative free artificial tears; [place] nuclear sclerotic cataract; PSC posterior subcapsular cataract; ERM epi-retinal membrane; PVD posterior vitreous detachment; RD  retinal detachment; DM diabetes mellitus; DR diabetic retinopathy; NPDR non-proliferative diabetic retinopathy; PDR proliferative diabetic retinopathy; CSME clinically significant macular edema; DME diabetic macular edema; dbh dot blot hemorrhages; CWS cotton wool spot; POAG primary open angle glaucoma; C/D cup-to-disc ratio; HVF humphrey visual field; GVF goldmann visual field; OCT optical coherence tomography; IOP intraocular pressure; BRVO Branch retinal vein occlusion; CRVO central retinal vein occlusion; CRAO central retinal artery occlusion; BRAO branch retinal artery occlusion; RT retinal tear; SB scleral buckle; PPV pars plana vitrectomy; VH Vitreous hemorrhage; PRP panretinal laser photocoagulation; IVK intravitreal kenalog; VMT vitreomacular traction; MH Macular hole;  NVD neovascularization of the disc; NVE neovascularization elsewhere; AREDS age related eye disease study; ARMD age related macular degeneration; POAG primary open angle glaucoma; EBMD epithelial/anterior basement membrane dystrophy; ACIOL anterior chamber intraocular lens; IOL intraocular lens; PCIOL posterior chamber intraocular lens; Phaco/IOL phacoemulsification with intraocular lens placement; [place] photorefractive keratectomy; LASIK laser assisted in situ keratomileusis; HTN hypertension; DM diabetes mellitus; COPD chronic obstructive pulmonary disease

## 2021-07-10 ENCOUNTER — Ambulatory Visit (INDEPENDENT_AMBULATORY_CARE_PROVIDER_SITE_OTHER): Payer: Medicare Other | Admitting: Ophthalmology

## 2021-07-10 ENCOUNTER — Encounter (INDEPENDENT_AMBULATORY_CARE_PROVIDER_SITE_OTHER): Payer: Self-pay | Admitting: Ophthalmology

## 2021-07-10 DIAGNOSIS — H25813 Combined forms of age-related cataract, bilateral: Secondary | ICD-10-CM

## 2021-07-10 DIAGNOSIS — H35371 Puckering of macula, right eye: Secondary | ICD-10-CM

## 2021-07-10 DIAGNOSIS — H35033 Hypertensive retinopathy, bilateral: Secondary | ICD-10-CM

## 2021-07-10 DIAGNOSIS — H33323 Round hole, bilateral: Secondary | ICD-10-CM

## 2021-07-10 DIAGNOSIS — Z9889 Other specified postprocedural states: Secondary | ICD-10-CM

## 2021-07-10 DIAGNOSIS — I10 Essential (primary) hypertension: Secondary | ICD-10-CM

## 2021-07-10 DIAGNOSIS — H35413 Lattice degeneration of retina, bilateral: Secondary | ICD-10-CM

## 2021-07-12 ENCOUNTER — Encounter (INDEPENDENT_AMBULATORY_CARE_PROVIDER_SITE_OTHER): Payer: Self-pay | Admitting: Ophthalmology

## 2021-07-13 NOTE — Progress Notes (Incomplete)
[facility] Note  07/10/2021     CHIEF COMPLAINT Patient presents for Retina Follow Up   HISTORY OF PRESENT ILLNESS: Kelli Logan is a 61 y.o. female who presents to the clinic today for:   HPI     Retina Follow Up   Patient presents with  Other.  In both eyes.  This started 4 weeks ago.  I, the attending physician,  performed the HPI with the patient and updated documentation appropriately.        Comments   Patient here for 4 weeks retina follow up for lattice deg OU s/p laser OD (5.22.23) and OS (6.06.23). Patient states vision no change. No eye pain.       Last edited by Bernarda Caffey, MD on 07/12/2021 11:58 PM.    Patient states no new problems, no changes.   Referring physician: Ane Payment, [address] [place],  [place] [address]  HISTORICAL INFORMATION:   Selected notes from the MEDICAL RECORD NUMBER Referred by Dr. Anell Barr for retinal breaks LEE:  Ocular Hx- PMH-    CURRENT MEDICATIONS: No current outpatient medications on file. (Ophthalmic Drugs)   No current facility-administered medications for this visit. (Ophthalmic Drugs)   Current Outpatient Medications (Other)  Medication Sig  . atorvastatin (LIPITOR) 20 MG tablet Take 20 mg by mouth at bedtime.  . Coenzyme Q10 (COQ10) 100 MG CAPS Take 100 mg by mouth every evening.  . DULoxetine (CYMBALTA) 30 MG capsule Take 1 capsule (30 mg total) by mouth daily.  . hydrochlorothiazide (HYDRODIURIL) 25 MG tablet Take 25 mg by mouth daily.   . hydrOXYzine (VISTARIL) 50 MG capsule Take 1 capsule (50 mg total) by mouth daily as needed. For sleep, anxiety  . ipratropium (ATROVENT) 0.06 % nasal spray SMARTSIG:2 Spray(s) Both Nares 4 Times Daily PRN  . levothyroxine (SYNTHROID, LEVOTHROID) 50 MCG tablet Take 50 mcg by mouth every other day. Alternating with 75 mcg every other day-AM  . losartan (COZAAR) 100 MG tablet Take 100 mg by mouth daily. for high  blood pressure  . methocarbamol (ROBAXIN) 500 MG tablet Take 250-500 mg by mouth at bedtime as needed.  . metoprolol (LOPRESSOR) 50 MG tablet Take 1 tablet (50 mg total) by mouth 2 (two) times daily.  . mirtazapine (REMERON) 15 MG tablet Take 1 tablet (15 mg total) by mouth at bedtime. FOR SLEEP  . Multiple Vitamin (MULTIVITAMIN WITH MINERALS) TABS tablet Take 1 tablet by mouth daily.  . NONFORMULARY OR COMPOUNDED ITEM 1 Device by Other route as directed. CPAP-with the pressure setting of 10 cm H20 with heated humidity and ResMed AirFit F20 Full Face Mask , size small.  . Omega-3 Fatty Acids (FISH OIL PO) Take 1 capsule by mouth daily.  Marland Kitchen omeprazole (PRILOSEC) 20 MG capsule Take 20 mg by mouth every morning.  . Suvorexant (BELSOMRA) 10 MG TABS Take 10 mg by mouth at bedtime.  Marland Kitchen doxazosin (CARDURA) 1 MG tablet Take 1 tablet (1 mg total) by mouth 2 (two) times daily. Needs office visit for further refills. Thank you! (Patient not taking: Reported on 06/22/2021)  . levothyroxine (SYNTHROID) 75 MCG tablet Take 75 mcg by mouth every other day. Alternating with 50 mcg every other day-AM   No current facility-administered medications for this visit. (Other)   REVIEW OF SYSTEMS: ROS   Positive for: Neurological, Eyes Negative for: Constitutional, Gastrointestinal, Skin, Genitourinary, Musculoskeletal, HENT, Endocrine, Cardiovascular, Respiratory, Psychiatric, Allergic/Imm, Heme/Lymph Last edited by  Theodore Demark, COA on 07/10/2021  2:55 PM.     ALLERGIES Allergies  Allergen Reactions  . Tape Other (See Comments)    Paper tape after breast surgery/Burned skin   Tegaderm and other tape OK   PAST MEDICAL HISTORY Past Medical History:  Diagnosis Date  . Anxiety   . Arthritis   . Cancer [facility])    breast left  . Depression   . Fibromyalgia   . GERD (gastroesophageal reflux disease)   . Headache   . History of kidney stones    h/o  . Hypertension   . Hypothyroidism   . Sleep apnea     DOES NOT USE CPAP  . Thyroid disease    Past Surgical History:  Procedure Laterality Date  . APPENDECTOMY    . BREAST BIOPSY Left few yrs ago   stereotactic bx in [place], benign  . BREAST BIOPSY Left 12/26/2017   Affirm Bx IMC- X-Clip  . BREAST BIOPSY Left 01/14/2018   Affirm Bx- Coil clip- path pending  . BREAST IMPLANT EXCHANGE Right 02/16/2021   Procedure: PLACEMENT OF BREAST IMPLANT;  Surgeon: Wallace Going, DO;  Location: [place];  Service: Plastics;  Laterality: Right;  1.5 hour  . BREAST RECONSTRUCTION WITH PLACEMENT OF TISSUE EXPANDER AND ALLODERM Left 04/08/2018   Procedure: LEFT BREAST IMMEDIATE  RECONSTRUCTION WITH EXPANDER AND FLEX HD;  Surgeon: Wallace Going, DO;  Location: [facility];  Service: Plastics;  Laterality: Left;  . COLONOSCOPY WITH PROPOFOL N/A 06/26/2018   Procedure: COLONOSCOPY WITH PROPOFOL;  Surgeon: Virgel Manifold, MD;  Location: [facility];  Service: Endoscopy;  Laterality: N/A;  . ESOPHAGOGASTRODUODENOSCOPY (EGD) WITH PROPOFOL N/A 06/26/2018   Procedure: ESOPHAGOGASTRODUODENOSCOPY (EGD) WITH PROPOFOL;  Surgeon: Virgel Manifold, MD;  Location: [facility];  Service: Endoscopy;  Laterality: N/A;  . EYE SURGERY Bilateral    lasik  . FOOT BONE EXCISION    . IMAGE GUIDED SINUS SURGERY  10/220  . LIPOSUCTION Bilateral 02/16/2021   Procedure: LIPOSUCTION OF BILATERAL BREAST;  Surgeon: Wallace Going, DO;  Location: [facility];  Service: Plastics;  Laterality: Bilateral;  . LIPOSUCTION WITH LIPOFILLING Left 04/30/2019   Procedure: Fat grafting to left breast;  Surgeon: Wallace Going, DO;  Location: [place];  Service: Plastics;  Laterality: Left;  90 min, please  . MASTECTOMY Left 04/08/2019  . MASTECTOMY W/ SENTINEL NODE BIOPSY Left 04/08/2018   Procedure: MASTECTOMY WITH SENTINEL LYMPH NODE BIOPSY LEFT;  Surgeon: Fredirick Maudlin, MD;  Location: [facility];  Service: General;   Laterality: Left;  Marland Kitchen MASTOPEXY Right 08/04/2018   Procedure: MASTOPEXY;  Surgeon: Wallace Going, DO;  Location: [facility];  Service: Plastics;  Laterality: Right;  TOTAL CASE TIME SHOULD BE 3 HOURS, PLEASE  . REMOVAL OF TISSUE EXPANDER AND PLACEMENT OF IMPLANT Left 08/04/2018   Procedure: REMOVAL OF TISSUE EXPANDER AND PLACEMENT OF IMPLANT;  Surgeon: Wallace Going, DO;  Location: [facility];  Service: Plastics;  Laterality: Left;  . SCAR REVISION Left 04/30/2019   Procedure: release of scar contracture to left breast;  Surgeon: Wallace Going, DO;  Location: [facility];  Service: Plastics;  Laterality: Left;  . TOTAL HIP ARTHROPLASTY Left 02/11/2020   Procedure: TOTAL HIP ARTHROPLASTY ANTERIOR APPROACH;  Surgeon: Hessie Knows, MD;  Location: [facility];  Service: Orthopedics;  Laterality: Left;  . UMBILICAL HERNIA REPAIR  1983 and 1985   FAMILY HISTORY Family History  Problem Relation  Age of Onset  . Hypertension Mother   . Lung cancer Maternal Grandmother   . Heart disease Father   . Alcohol abuse Father   . Breast cancer Neg Hx   . Colon cancer Neg Hx    SOCIAL HISTORY Social History   Tobacco Use  . Smoking status: Former    Packs/day: 1.00    Years: 2.00    Total pack years: 2.00    Types: Cigarettes    Quit date: 01/06/2017    Years since quitting: 4.5  . Smokeless tobacco: Never  Vaping Use  . Vaping Use: Former  . Quit date: 11/27/2016  Substance Use Topics  . Alcohol use: Yes    Comment: rare beer   . Drug use: Not Currently       OPHTHALMIC EXAM:  Base Eye Exam     Visual Acuity (Snellen - Linear)       Right Left   Dist [place] 20/40 20/25 -2   Dist ph [place] 20/20 20/20         Tonometry (Tonopen, 2:53 PM)       Right Left   Pressure 12 11         Pupils       Dark Light Shape React APD   Right 4 3 Round Brisk None   Left 4 3 Round Brisk None         Visual Fields (Counting fingers)       Left Right    Full Full          Extraocular Movement       Right Left    Full, Ortho Full, Ortho         Neuro/Psych     Oriented x3: Yes   Mood/Affect: Normal         Dilation     Both eyes: 1.0% Mydriacyl, 2.5% Phenylephrine @ 2:53 PM           Slit Lamp and Fundus Exam     Slit Lamp Exam       Right Left   Lids/Lashes dermatochalasis dermatochalasis   Conjunctiva/Sclera whtie and quiet whtie and quiet   Cornea trace PEE, trace EBMD, well healed LASIK flap trace PEE, well healed LASIK flap   Anterior Chamber deep and clear deep and clear   Iris round and dilated round and dilated   Lens 1-2+ NS, 1-2+ cortical 2+ NS, 2+ cortical   Anterior Vitreous PVD, vitreous condensations, mild syneresis mild syneresis, PVD, vitreous condensation         Fundus Exam       Right Left   Disc pink, sharp, tilted, + cupping, temporal PPA pink, sharp, tilted, + cupping, temporal PPA   C/D Ratio 0.75 0.65   Macula Flat, good foveal reflex, mild ERM inferonasal macula Flat, good foveal reflex, mild RPE mottling, no heme or edema   Vessels mild attenuation mild attenuation   Periphery attached, no heme, lattice with retinal tears from 1130 to 1200, round operculated hole with pigment ring at 0130, pigmented retinal break at 1000 - good laser changes surrounding all lesions, no new RT/RD/lattice attached, no heme, patch of pigmented lattice from 1130-1200; 2 small tears w/ +SRF from 1200-0100, good laser surrounding all lesions, no new RT/RD/lattice           IMAGING AND PROCEDURES  Imaging and Procedures for 07/10/2021  OCT, Retina - OU - Both Eyes       Right Eye Quality  was good. Central Foveal Thickness: 272. Progression has been stable. Findings include normal foveal contour, no IRF, no SRF, epiretinal membrane, macular pucker (Focal ERM with pucker inferonasal macula -- stable).   Left Eye Quality was good. Central Foveal Thickness: 269 (Partial PVD). Progression has been stable.  Findings include normal foveal contour, no IRF, no SRF (Partial PVD).   Notes *Images captured and stored on drive  Diagnosis / Impression:  OD: NFP; no IRF/SRF; ERM with macular pucker inferonasal macula -- stable OS: NFP; no IRF/SRF  Clinical management:  See below  Abbreviations: NFP - Normal foveal profile. CME - cystoid macular edema. PED - pigment epithelial detachment. IRF - intraretinal fluid. SRF - subretinal fluid. EZ - ellipsoid zone. ERM - epiretinal membrane. ORA - outer retinal atrophy. ORT - outer retinal tubulation. SRHM - subretinal hyper-reflective material. IRHM - intraretinal hyper-reflective material            ASSESSMENT/PLAN:    ICD-10-CM   1. Bilateral retinal lattice degeneration  H35.413     2. Retinal hole of both eyes  H33.323     3. Epiretinal membrane (ERM) of right eye  H35.371 OCT, Retina - OU - Both Eyes    4. Essential hypertension  I10     5. Hypertensive retinopathy of both eyes  H35.033     6. Combined forms of age-related cataract of both eyes  H25.813     7. Hx of LASIK  Z98.890       1,2. Lattice degeneration OU with retinal holes OU  - OD: Patch of lattice w/ breaks from 1130-1200; pigmented hole at 0130; pigmented break at 1000  - OS: pigmented lattice from 1130 to 1200; 2 small tears w/ +SRF from 1200-0100  - s/p laser retinopexy OD (05.22.23) -- good laser changes surrounding all lesions - s/p laser retinopexy OS (06.06.23) -- good laser changes surrounding all lesions - completed Lotemax SM QID OS x7 days - f/u in 3 months -- DFE/OCT  3. Epiretinal membrane, right eye  - mild ERM w/ pucker -- inf nasal mac, noncentral - BCVA 20/20 - asymptomatic, no metamorphopsia - no indication for surgery at this time - monitor for now  4,5. Hypertensive retinopathy OU - discussed importance of tight BP control - monitor  6. Age related cataracts OU   - The symptoms of cataract, surgical options, and treatments and risks were  discussed with patient. - discussed diagnosis and progression - not yet visually significant - monitor  7. History of LASIK OU   Ophthalmic Meds Ordered this visit:  No orders of the defined types were placed in this encounter.    Return in about 3 months (around 10/10/2021) for DFE, OCT.  There are no Patient Instructions on file for this visit.   Explained the diagnoses, plan, and follow up with the patient and they expressed understanding.  Patient expressed understanding of the importance of proper follow up care.   This document serves as a record of services personally performed by Gardiner Sleeper, MD, PhD. It was created on their behalf by San Jetty. Owens Shark, OA an ophthalmic technician. The creation of this record is the provider's dictation and/or activities during the visit.    Electronically signed by: San Jetty. Owens Shark, [place] 06.30.2023 11:59 PM  This document serves as a record of services personally performed by Gardiner Sleeper, MD, PhD. It was created on their behalf by Leonie Douglas, an ophthalmic technician. The creation of this record is the provider's  dictation and/or activities during the visit.    Electronically signed by: Leonie Douglas COA, 07/12/21  11:59 PM   Gardiner Sleeper, M.D., Ph.D. Diseases & Surgery of the Retina and Vitreous [facility]: M myopia (nearsighted); A astigmatism; H hyperopia (farsighted); P presbyopia; Mrx spectacle prescription;  CTL contact lenses; OD right eye; OS left eye; OU both eyes  XT exotropia; ET esotropia; PEK punctate epithelial keratitis; PEE punctate epithelial erosions; DES dry eye syndrome; MGD meibomian gland dysfunction; ATs artificial tears; PFAT's preservative free artificial tears; [place] nuclear sclerotic cataract; PSC posterior subcapsular cataract; ERM epi-retinal membrane; PVD posterior vitreous detachment; RD retinal detachment; DM diabetes mellitus; DR diabetic retinopathy; NPDR  non-proliferative diabetic retinopathy; PDR proliferative diabetic retinopathy; CSME clinically significant macular edema; DME diabetic macular edema; dbh dot blot hemorrhages; CWS cotton wool spot; POAG primary open angle glaucoma; C/D cup-to-disc ratio; HVF humphrey visual field; GVF goldmann visual field; OCT optical coherence tomography; IOP intraocular pressure; BRVO Branch retinal vein occlusion; CRVO central retinal vein occlusion; CRAO central retinal artery occlusion; BRAO branch retinal artery occlusion; RT retinal tear; SB scleral buckle; PPV pars plana vitrectomy; VH Vitreous hemorrhage; PRP panretinal laser photocoagulation; IVK intravitreal kenalog; VMT vitreomacular traction; MH Macular hole;  NVD neovascularization of the disc; NVE neovascularization elsewhere; AREDS age related eye disease study; ARMD age related macular degeneration; POAG primary open angle glaucoma; EBMD epithelial/anterior basement membrane dystrophy; ACIOL anterior chamber intraocular lens; IOL intraocular lens; PCIOL posterior chamber intraocular lens; Phaco/IOL phacoemulsification with intraocular lens placement; [place] photorefractive keratectomy; LASIK laser assisted in situ keratomileusis; HTN hypertension; DM diabetes mellitus; COPD chronic obstructive pulmonary disease

## 2021-07-16 ENCOUNTER — Other Ambulatory Visit: Payer: Self-pay | Admitting: Psychiatry

## 2021-07-16 DIAGNOSIS — F4310 Post-traumatic stress disorder, unspecified: Secondary | ICD-10-CM

## 2021-07-20 ENCOUNTER — Telehealth: Payer: Self-pay | Admitting: Psychiatry

## 2021-07-20 ENCOUNTER — Telehealth: Payer: Medicare Other | Admitting: Psychiatry

## 2021-07-20 DIAGNOSIS — F4310 Post-traumatic stress disorder, unspecified: Secondary | ICD-10-CM

## 2021-07-20 MED ORDER — BELSOMRA 10 MG PO TABS: 10.0000 mg | ORAL_TABLET | Freq: Every day | ORAL | 0 refills | Status: DC

## 2021-07-20 NOTE — Telephone Encounter (Signed)
Pt. cancelled today's appointment yesterday. Called back to ask if it is still available and it is not. Stated she may run out of meds. before 07/26/2021 appointment. Notifying provider and CMA.

## 2021-07-20 NOTE — Telephone Encounter (Signed)
called pt she states she needs refill on the belsomra '10mg'$  .  Pt has appt 07-26-21   Suvorexant (BELSOMRA) 10 MG TABS Medication Date: 06/22/2021 Department: [facility] Ordering/Authorizing: Ursula Alert, MD   Order Providers  Prescribing Provider Encounter Provider  Ursula Alert, MD Ursula Alert, MD   Outpatient Medication Detail   Disp Refills Start End   Suvorexant (BELSOMRA) 10 MG TABS 30 tablet 0 06/22/2021    Sig - Route: Take 10 mg by mouth at bedtime. - Oral   Sent to pharmacy as: Suvorexant ([place]) 10 MG Tab   E-Prescribing Status: Receipt confirmed by pharmacy (06/22/2021  4:43 PM EDT)    Associated Diagnoses  PTSD (post-traumatic stress disorder)  - Primary      Pharmacy  [id number] [address]

## 2021-07-20 NOTE — Telephone Encounter (Signed)
I have sent Belsomra to pharmacy.

## 2021-07-26 ENCOUNTER — Encounter: Payer: Self-pay | Admitting: Psychiatry

## 2021-07-26 ENCOUNTER — Telehealth: Payer: Self-pay | Admitting: Psychiatry

## 2021-07-26 ENCOUNTER — Telehealth (INDEPENDENT_AMBULATORY_CARE_PROVIDER_SITE_OTHER): Payer: Medicare Other | Admitting: Psychiatry

## 2021-07-26 DIAGNOSIS — F3342 Major depressive disorder, recurrent, in full remission: Secondary | ICD-10-CM | POA: Diagnosis not present

## 2021-07-26 DIAGNOSIS — F424 Excoriation (skin-picking) disorder: Secondary | ICD-10-CM | POA: Diagnosis not present

## 2021-07-26 DIAGNOSIS — F410 Panic disorder [episodic paroxysmal anxiety] without agoraphobia: Secondary | ICD-10-CM | POA: Diagnosis not present

## 2021-07-26 DIAGNOSIS — F4310 Post-traumatic stress disorder, unspecified: Secondary | ICD-10-CM

## 2021-07-26 DIAGNOSIS — G4701 Insomnia due to medical condition: Secondary | ICD-10-CM

## 2021-07-26 DIAGNOSIS — F4010 Social phobia, unspecified: Secondary | ICD-10-CM

## 2021-07-26 NOTE — Progress Notes (Signed)
Virtual Visit via Video Note  I connected with Kelli Logan on 07/26/21 at 10:20 AM EDT by a video enabled telemedicine application and verified that I am speaking with the correct person using two identifiers.  Location Provider Location : [facility] Patient Location : Home  Participants: Patient , Provider    I discussed the limitations of evaluation and management by telemedicine and the availability of in person appointments. The patient expressed understanding and agreed to proceed.    I discussed the assessment and treatment plan with the patient. The patient was provided an opportunity to ask questions and all were answered. The patient agreed with the plan and demonstrated an understanding of the instructions.   The patient was advised to call back or seek an in-person evaluation if the symptoms worsen or if the condition fails to improve as anticipated.                                                                  [place] MD OP Progress Note  07/26/2021 1:04 PM Kelli Logan  MRN:  [id number]  Chief Complaint:  Chief Complaint  Patient presents with   Follow-up: 61 year old Caucasian female with history of depression, anxiety, skin picking disorder, PTSD, borderline personality disorder, presented for medication management.   HPI: Kelli Logan is a 61 year old Caucasian female, divorced, lives in [place], has a history of skin picking disorder, PTSD, social anxiety, history of borderline personality disorder, cognitive disorder, breast cancer per history, fibromyalgia, total hip replacement, obstructive sleep apnea on CPAP was evaluated by telemedicine today.  Patient today reports she is currently anxious.  She reports she received a letter for jury duty.  She does not like to be in public situations or group situations.  She reports her anxiety gets out of control.  Last time she went to jury duty she had an anxiety attack, did not have anything to eat which affected  her blood sugar level and had significant problems emotionally and physically.  She reports just the thought of going back to jury duty makes her anxious and go in to a panic mode.  She is interested in getting a letter to excuse herself at this time.  Patient reports since the Belsomra 20 mg was giving her side effects she started taking the Belsomra 10 mg as discussed recently.  She reports that was helpful until she got the letter for jury duty.  She reports she continues to not tolerate the mask for the CPAP and is going for an appointment for an evaluation to find out if she will be a good candidate for ' inspire ' for her sleep apnea.  Denies any depressive symptoms.  Patient is currently compliant on her medications.  Denies side effects.  Patient denies any suicidality, homicidality or perceptual disturbances.  Patient denies any other concerns today.  Visit Diagnosis:    ICD-10-CM   1. PTSD (post-traumatic stress disorder)  F43.10     2. Skin-picking disorder  F42.4     3. MDD (major depressive disorder), recurrent, in full remission ([place])  F33.42     4. Panic disorder  F41.0     5. Social anxiety disorder  F40.10     6. Insomnia due to medical condition  G47.01  OSA, mood, pain      Past Psychiatric History: Reviewed past psychiatric history from progress note on 03/10/2018.  Past Medical History:  Past Medical History:  Diagnosis Date   Anxiety    Arthritis    Cancer ([place])    breast left   Depression    Fibromyalgia    GERD (gastroesophageal reflux disease)    Headache    History of kidney stones    h/o   Hypertension    Hypothyroidism    Sleep apnea    DOES NOT USE CPAP   Thyroid disease     Past Surgical History:  Procedure Laterality Date   APPENDECTOMY     BREAST BIOPSY Left few yrs ago   stereotactic bx in [place], benign   BREAST BIOPSY Left 12/26/2017   Affirm Bx IMC- X-Clip   BREAST BIOPSY Left 01/14/2018   Affirm Bx- Coil clip- path pending    BREAST IMPLANT EXCHANGE Right 02/16/2021   Procedure: PLACEMENT OF BREAST IMPLANT;  Surgeon: Wallace Going, DO;  Location: [facility];  Service: Plastics;  Laterality: Right;  1.5 hour   BREAST RECONSTRUCTION WITH PLACEMENT OF TISSUE EXPANDER AND ALLODERM Left 04/08/2018   Procedure: LEFT BREAST IMMEDIATE  RECONSTRUCTION WITH EXPANDER AND FLEX HD;  Surgeon: Wallace Going, DO;  Location: [facility];  Service: Plastics;  Laterality: Left;   COLONOSCOPY WITH PROPOFOL N/A 06/26/2018   Procedure: COLONOSCOPY WITH PROPOFOL;  Surgeon: Virgel Manifold, MD;  Location: [facility];  Service: Endoscopy;  Laterality: N/A;   ESOPHAGOGASTRODUODENOSCOPY (EGD) WITH PROPOFOL N/A 06/26/2018   Procedure: ESOPHAGOGASTRODUODENOSCOPY (EGD) WITH PROPOFOL;  Surgeon: Virgel Manifold, MD;  Location: [facility];  Service: Endoscopy;  Laterality: N/A;   EYE SURGERY Bilateral    lasik   FOOT BONE EXCISION     IMAGE GUIDED SINUS SURGERY  10/220   LIPOSUCTION Bilateral 02/16/2021   Procedure: LIPOSUCTION OF BILATERAL BREAST;  Surgeon: Wallace Going, DO;  Location: [facility];  Service: Plastics;  Laterality: Bilateral;   LIPOSUCTION WITH LIPOFILLING Left 04/30/2019   Procedure: Fat grafting to left breast;  Surgeon: Wallace Going, DO;  Location: [facility];  Service: Plastics;  Laterality: Left;  90 min, please   MASTECTOMY Left 04/08/2019   MASTECTOMY W/ SENTINEL NODE BIOPSY Left 04/08/2018   Procedure: MASTECTOMY WITH SENTINEL LYMPH NODE BIOPSY LEFT;  Surgeon: Fredirick Maudlin, MD;  Location: [facility];  Service: General;  Laterality: Left;   MASTOPEXY Right 08/04/2018   Procedure: MASTOPEXY;  Surgeon: Wallace Going, DO;  Location: [facility];  Service: Plastics;  Laterality: Right;  TOTAL CASE TIME SHOULD BE 3 HOURS, PLEASE   REMOVAL OF TISSUE EXPANDER AND PLACEMENT OF IMPLANT Left 08/04/2018   Procedure: REMOVAL OF TISSUE EXPANDER AND  PLACEMENT OF IMPLANT;  Surgeon: Wallace Going, DO;  Location: [facility];  Service: Plastics;  Laterality: Left;   SCAR REVISION Left 04/30/2019   Procedure: release of scar contracture to left breast;  Surgeon: Wallace Going, DO;  Location: [facility];  Service: Plastics;  Laterality: Left;   TOTAL HIP ARTHROPLASTY Left 02/11/2020   Procedure: TOTAL HIP ARTHROPLASTY ANTERIOR APPROACH;  Surgeon: Hessie Knows, MD;  Location: [facility];  Service: Orthopedics;  Laterality: Left;   UMBILICAL HERNIA REPAIR  1983 and 1985    Family Psychiatric History: Reviewed family psychiatric history from progress note on 03/10/2018.  Family History:  Family History  Problem Relation Age of Onset  Hypertension Mother    Lung cancer Maternal Grandmother    Heart disease Father    Alcohol abuse Father    Breast cancer Neg Hx    Colon cancer Neg Hx     Social History: Reviewed social history from progress note on 03/10/2018. Social History   Socioeconomic History   Marital status: Divorced    Spouse name: Not on file   Number of children: 1   Years of education: Not on file   Highest education level: High school graduate  Occupational History   Not on file  Tobacco Use   Smoking status: Former    Packs/day: 1.00    Years: 2.00    Total pack years: 2.00    Types: Cigarettes    Quit date: 01/06/2017    Years since quitting: 4.5   Smokeless tobacco: Never  Vaping Use   Vaping Use: Former   Quit date: 11/27/2016  Substance and Sexual Activity   Alcohol use: Yes    Comment: rare beer    Drug use: Not Currently   Sexual activity: Not on file  Other Topics Concern   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 Quit smoking; ocassional alcohol.    Social Determinants of Health   Financial Resource Strain: High Risk (12/31/2019)   Overall Financial  Resource Strain (CARDIA)    Difficulty of Paying Living Expenses: Very hard  Food Insecurity: Food Insecurity Present (12/31/2019)   Hunger Vital Sign    Worried About Running Out of Food in the Last Year: Often true    Ran Out of Food in the Last Year: Often true  Transportation Needs: Unmet Transportation Needs (03/10/2018)   PRAPARE - Hydrologist (Medical): Yes    Lack of Transportation (Non-Medical): Yes  Physical Activity: Inactive (03/10/2018)   Exercise Vital Sign    Days of Exercise per Week: 0 days    Minutes of Exercise per Session: 0 min  Stress: Stress Concern Present (12/31/2019)   [place]    Feeling of Stress : Very much  Social Connections: Unknown (03/10/2018)   Social Connection and Isolation Panel [NHANES]    Frequency of Communication with Friends and Family: Not on file    Frequency of Social Gatherings with Friends and Family: Not on file    Attends Religious Services: Never    Marine scientist or Organizations: No    Attends Archivist Meetings: Never    Marital Status: Divorced    Allergies:  Allergies  Allergen Reactions   Tape Other (See Comments)    Paper tape after breast surgery/Burned skin   Tegaderm and other tape OK    Metabolic Disorder Labs: No results found for: "HGBA1C", "MPG" No results found for: "PROLACTIN" No results found for: "CHOL", "TRIG", "HDL", "CHOLHDL", "VLDL", "LDLCALC" No results found for: "TSH"  Therapeutic Level Labs: No results found for: "LITHIUM" No results found for: "VALPROATE" No results found for: "CBMZ"  Current Medications: Current Outpatient Medications  Medication Sig Dispense Refill   atorvastatin (LIPITOR) 20 MG tablet Take 20 mg by mouth at bedtime.     Coenzyme Q10 (COQ10) 100 MG CAPS Take 100 mg by mouth every evening.     DULoxetine (CYMBALTA) 30 MG capsule Take 1 capsule (30 mg total) by  mouth daily. 90 capsule 0  hydrochlorothiazide (HYDRODIURIL) 25 MG tablet Take 25 mg by mouth daily.      ipratropium (ATROVENT) 0.06 % nasal spray SMARTSIG:2 Spray(s) Both Nares 4 Times Daily PRN     levothyroxine (SYNTHROID) 75 MCG tablet Take 75 mcg by mouth every other day. Alternating with 50 mcg every other day-AM     levothyroxine (SYNTHROID, LEVOTHROID) 50 MCG tablet Take 50 mcg by mouth every other day. Alternating with 75 mcg every other day-AM     losartan (COZAAR) 100 MG tablet Take 100 mg by mouth daily. for high blood pressure     methocarbamol (ROBAXIN) 500 MG tablet Take 250-500 mg by mouth at bedtime as needed.     mirtazapine (REMERON) 15 MG tablet TAKE 1 TABLET (15 MG TOTAL) BY MOUTH AT BEDTIME. FOR SLEEP 90 tablet 0   Multiple Vitamin (MULTIVITAMIN WITH MINERALS) TABS tablet Take 1 tablet by mouth daily.     NONFORMULARY OR COMPOUNDED ITEM 1 Device by Other route as directed. CPAP-with the pressure setting of 10 cm H20 with heated humidity and ResMed AirFit F20 Full Face Mask , size small. 1 each 0   Omega-3 Fatty Acids (FISH OIL PO) Take 1 capsule by mouth daily.     omeprazole (PRILOSEC) 20 MG capsule Take 20 mg by mouth every morning.     Suvorexant (BELSOMRA) 10 MG TABS Take 10 mg by mouth at bedtime. 30 tablet 0   metoprolol (LOPRESSOR) 50 MG tablet Take 1 tablet (50 mg total) by mouth 2 (two) times daily. 60 tablet 0   No current facility-administered medications for this visit.     Musculoskeletal: Strength & Muscle Tone:  UTA Gait & Station:  Seated Patient leans: N/A  Psychiatric Specialty Exam: Review of Systems  Musculoskeletal:  Positive for back pain.  Psychiatric/Behavioral:  Positive for sleep disturbance. The patient is nervous/anxious.   All other systems reviewed and are negative.   Last menstrual period 02/05/2016.There is no height or weight on file to calculate BMI.  General Appearance: Casual  Eye Contact:  Fair  Speech:  Normal Rate   Volume:  Normal  Mood:  Anxious  Affect:  Congruent  Thought Process:  Goal Directed and Descriptions of Associations: Intact  Orientation:  Full (Time, Place, and Person)  Thought Content: Logical   Suicidal Thoughts:  Fair  Homicidal Thoughts:  No  Memory:  Immediate;   Fair Recent;   Fair Remote;   Fair  Judgement:  Fair  Insight:  Fair  Psychomotor Activity:  Normal  Concentration:  Concentration: Fair and Attention Span: Fair  Recall:  [employer] of Knowledge: Fair  Language: Fair  Akathisia:  No  Handed:  Right  AIMS (if indicated): done  Assets:  Communication Skills Desire for Improvement Housing Social Support  ADL's:  Intact  Cognition: WNL  Sleep:   restless due to anxiety   Screenings: AIMS    Flowsheet Row Video Visit from 06/22/2021 in [facility] Video Visit from 05/22/2021 in [facility] Total Score 0 0      GAD-7    Flowsheet Row Video Visit from 07/26/2021 in [facility] Office Visit from 08/30/2020 in [facility]  Total GAD-7 Score 4 9      PHQ2-9    [place] Video Visit from 07/26/2021 in [facility] Video Visit from 06/22/2021 in [facility] Video Visit from 05/22/2021 in [facility] from  12/05/2020 in [facility] Counselor from 09/13/2020 in [facility]  PHQ-2 Total Score 0 0 0 0 0      Flowsheet Row Video Visit from 07/26/2021 in [facility] Video Visit from 06/22/2021 in [facility] Video Visit from 05/22/2021 in [facility] Low Risk Low Risk No Risk        Assessment and Plan: Kelli Logan is a 61 year old Caucasian female, divorced, lives in [place], has  a history of PTSD, skin picking disorder, panic disorder, social anxiety disorder, multiple medical problems was evaluated by telemedicine today.  Patient is currently struggling with anxiety due to upcoming jury duty, continues to have sleep problems due to anxiety as well as CPAP mask problem.  She will benefit from the following plan.  Plan  PTSD-stable Mirtazapine 15 mg p.o. nightly Continue CBT  Skin picking disorder-improving Continue CBT with Ms. Christina Hussami  MDD in remission Cymbalta 30 mg p.o. daily  Panic disorder-stable Cymbalta as prescribed  Social anxiety disorder-stable Mirtazapine as prescribed   Insomnia-unstable Belsomra 10 mg p.o. nightly.  Reviewed [place] PMP aware Patient to continue CPAP for OSA, she does have upcoming consultation to see if she will be a good candidate for inspire for sleep apnea.  She currently has mask problems.  I have printed out a letter excusing patient from jury duty.  Patient to come and pick up the letter.  Follow-up in clinic in 2 to 3 months or sooner if needed.   Collaboration of Care: Collaboration of Care: Referral or follow-up with counselor/therapist AEB encouraged to continue psychotherapy sessions.  Patient/Guardian was advised Release of Information must be obtained prior to any record release in order to collaborate their care with an outside provider. Patient/Guardian was advised if they have not already done so to contact the registration department to sign all necessary forms in order for [place] to release information regarding their care.   Consent: Patient/Guardian gives verbal consent for treatment and assignment of benefits for services provided during this visit. Patient/Guardian expressed understanding and agreed to proceed.   This note was generated in part or whole with voice recognition software. Voice recognition is usually quite accurate but there are transcription errors that can and very often do occur. I  apologize for any typographical errors that were not detected and corrected.      Ursula Alert, MD 07/26/2021, 1:04 PM

## 2021-07-26 NOTE — Telephone Encounter (Signed)
I have printed out a letter for excusing patient from jury duty.  Patient to pick up the letter today or tomorrow.  Completed letter-with Janett Billow CMA.

## 2021-08-08 DIAGNOSIS — J029 Acute pharyngitis, unspecified: Secondary | ICD-10-CM | POA: Insufficient documentation

## 2021-08-08 DIAGNOSIS — R300 Dysuria: Secondary | ICD-10-CM | POA: Insufficient documentation

## 2021-08-14 DIAGNOSIS — A562 Chlamydial infection of genitourinary tract, unspecified: Secondary | ICD-10-CM | POA: Insufficient documentation

## 2021-08-25 ENCOUNTER — Telehealth: Payer: Self-pay

## 2021-08-25 NOTE — Telephone Encounter (Signed)
Yes it is okay to do so. Will forward this to Dr. Shea Evans so that she is aware.

## 2021-08-25 NOTE — Telephone Encounter (Signed)
pt left a message that she is currently taking the belsomra and she started her CPAP and now she is having bad dreams.  she asked if it was ok to finish the belsomra and then go back on the hydroxyzine '50mg'$  and also stay on the mirtazapine

## 2021-08-25 NOTE — Telephone Encounter (Signed)
pt was called and let her know that per dr. Modesta Messing it was ok.  spoke with patient gave notice that rx was sent in.

## 2021-09-11 ENCOUNTER — Other Ambulatory Visit: Payer: Self-pay | Admitting: Psychiatry

## 2021-09-11 DIAGNOSIS — F3342 Major depressive disorder, recurrent, in full remission: Secondary | ICD-10-CM

## 2021-09-11 DIAGNOSIS — F410 Panic disorder [episodic paroxysmal anxiety] without agoraphobia: Secondary | ICD-10-CM

## 2021-09-11 DIAGNOSIS — F424 Excoriation (skin-picking) disorder: Secondary | ICD-10-CM

## 2021-09-11 DIAGNOSIS — F4010 Social phobia, unspecified: Secondary | ICD-10-CM

## 2021-09-11 DIAGNOSIS — F4310 Post-traumatic stress disorder, unspecified: Secondary | ICD-10-CM

## 2021-09-25 DIAGNOSIS — Z6833 Body mass index (BMI) 33.0-33.9, adult: Secondary | ICD-10-CM | POA: Insufficient documentation

## 2021-09-28 ENCOUNTER — Telehealth (INDEPENDENT_AMBULATORY_CARE_PROVIDER_SITE_OTHER): Payer: Medicare Other | Admitting: Psychiatry

## 2021-09-28 ENCOUNTER — Encounter: Payer: Self-pay | Admitting: Psychiatry

## 2021-09-28 DIAGNOSIS — F4310 Post-traumatic stress disorder, unspecified: Secondary | ICD-10-CM

## 2021-09-28 DIAGNOSIS — F410 Panic disorder [episodic paroxysmal anxiety] without agoraphobia: Secondary | ICD-10-CM

## 2021-09-28 DIAGNOSIS — F4010 Social phobia, unspecified: Secondary | ICD-10-CM

## 2021-09-28 DIAGNOSIS — F424 Excoriation (skin-picking) disorder: Secondary | ICD-10-CM

## 2021-09-28 DIAGNOSIS — F3342 Major depressive disorder, recurrent, in full remission: Secondary | ICD-10-CM

## 2021-09-28 DIAGNOSIS — G4701 Insomnia due to medical condition: Secondary | ICD-10-CM

## 2021-09-28 MED ORDER — MIRTAZAPINE 15 MG PO TABS: 15.0000 mg | ORAL_TABLET | Freq: Every day | ORAL | 0 refills | Status: DC

## 2021-09-28 MED ORDER — BUSPIRONE HCL 10 MG PO TABS: 10.0000 mg | ORAL_TABLET | Freq: Three times a day (TID) | ORAL | 1 refills | Status: DC
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
## 2021-10-05 ENCOUNTER — Other Ambulatory Visit: Payer: Self-pay | Admitting: Otolaryngology

## 2021-10-10 ENCOUNTER — Encounter (INDEPENDENT_AMBULATORY_CARE_PROVIDER_SITE_OTHER): Payer: Medicare Other | Admitting: Ophthalmology

## 2021-10-20 ENCOUNTER — Other Ambulatory Visit: Payer: Self-pay | Admitting: Psychiatry

## 2021-10-20 DIAGNOSIS — F424 Excoriation (skin-picking) disorder: Secondary | ICD-10-CM

## 2021-10-20 DIAGNOSIS — F410 Panic disorder [episodic paroxysmal anxiety] without agoraphobia: Secondary | ICD-10-CM

## 2021-10-20 DIAGNOSIS — F3342 Major depressive disorder, recurrent, in full remission: Secondary | ICD-10-CM

## 2021-10-20 DIAGNOSIS — F4310 Post-traumatic stress disorder, unspecified: Secondary | ICD-10-CM

## 2021-10-20 DIAGNOSIS — F4010 Social phobia, unspecified: Secondary | ICD-10-CM

## 2021-11-01 ENCOUNTER — Telehealth: Payer: Medicare Other | Admitting: Internal Medicine

## 2021-11-16 ENCOUNTER — Encounter: Payer: Self-pay | Admitting: Psychiatry

## 2021-11-16 ENCOUNTER — Telehealth (INDEPENDENT_AMBULATORY_CARE_PROVIDER_SITE_OTHER): Payer: Medicare Other | Admitting: Psychiatry

## 2021-11-16 DIAGNOSIS — F424 Excoriation (skin-picking) disorder: Secondary | ICD-10-CM | POA: Diagnosis not present

## 2021-11-16 DIAGNOSIS — F3342 Major depressive disorder, recurrent, in full remission: Secondary | ICD-10-CM

## 2021-11-16 DIAGNOSIS — F4310 Post-traumatic stress disorder, unspecified: Secondary | ICD-10-CM

## 2021-11-16 DIAGNOSIS — F410 Panic disorder [episodic paroxysmal anxiety] without agoraphobia: Secondary | ICD-10-CM | POA: Diagnosis not present

## 2021-11-16 DIAGNOSIS — G4701 Insomnia due to medical condition: Secondary | ICD-10-CM

## 2021-11-16 DIAGNOSIS — F4010 Social phobia, unspecified: Secondary | ICD-10-CM

## 2021-11-16 MED ORDER — BUSPIRONE HCL 15 MG PO TABS: 15.0000 mg | ORAL_TABLET | Freq: Three times a day (TID) | ORAL | 1 refills | Status: DC
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
## 2021-11-19 ENCOUNTER — Other Ambulatory Visit: Payer: Self-pay | Admitting: Psychiatry

## 2021-11-19 DIAGNOSIS — F410 Panic disorder [episodic paroxysmal anxiety] without agoraphobia: Secondary | ICD-10-CM

## 2021-11-19 DIAGNOSIS — F424 Excoriation (skin-picking) disorder: Secondary | ICD-10-CM

## 2021-11-19 DIAGNOSIS — F4010 Social phobia, unspecified: Secondary | ICD-10-CM

## 2021-11-19 DIAGNOSIS — F3342 Major depressive disorder, recurrent, in full remission: Secondary | ICD-10-CM

## 2021-11-19 DIAGNOSIS — F4310 Post-traumatic stress disorder, unspecified: Secondary | ICD-10-CM

## 2021-11-27 ENCOUNTER — Other Ambulatory Visit: Payer: Self-pay | Admitting: Nurse Practitioner

## 2021-11-27 DIAGNOSIS — Z1231 Encounter for screening mammogram for malignant neoplasm of breast: Secondary | ICD-10-CM

## 2021-11-28 ENCOUNTER — Other Ambulatory Visit: Payer: Self-pay | Admitting: Psychiatry

## 2021-11-28 DIAGNOSIS — F424 Excoriation (skin-picking) disorder: Secondary | ICD-10-CM

## 2021-11-28 DIAGNOSIS — F410 Panic disorder [episodic paroxysmal anxiety] without agoraphobia: Secondary | ICD-10-CM

## 2021-11-28 DIAGNOSIS — F4010 Social phobia, unspecified: Secondary | ICD-10-CM

## 2021-11-28 DIAGNOSIS — F4310 Post-traumatic stress disorder, unspecified: Secondary | ICD-10-CM

## 2021-11-28 DIAGNOSIS — F3342 Major depressive disorder, recurrent, in full remission: Secondary | ICD-10-CM

## 2021-12-09 ENCOUNTER — Other Ambulatory Visit: Payer: Self-pay | Admitting: Psychiatry

## 2021-12-09 DIAGNOSIS — F4310 Post-traumatic stress disorder, unspecified: Secondary | ICD-10-CM

## 2021-12-09 DIAGNOSIS — F424 Excoriation (skin-picking) disorder: Secondary | ICD-10-CM

## 2021-12-09 DIAGNOSIS — F410 Panic disorder [episodic paroxysmal anxiety] without agoraphobia: Secondary | ICD-10-CM

## 2021-12-09 DIAGNOSIS — F4010 Social phobia, unspecified: Secondary | ICD-10-CM

## 2021-12-20 ENCOUNTER — Ambulatory Visit (INDEPENDENT_AMBULATORY_CARE_PROVIDER_SITE_OTHER): Payer: Medicare Other

## 2021-12-20 ENCOUNTER — Ambulatory Visit (INDEPENDENT_AMBULATORY_CARE_PROVIDER_SITE_OTHER): Payer: Medicare Other | Admitting: Podiatry

## 2021-12-20 DIAGNOSIS — M722 Plantar fascial fibromatosis: Secondary | ICD-10-CM | POA: Diagnosis not present

## 2021-12-20 DIAGNOSIS — M7752 Other enthesopathy of left foot: Secondary | ICD-10-CM

## 2021-12-20 DIAGNOSIS — M7750 Other enthesopathy of unspecified foot: Secondary | ICD-10-CM

## 2021-12-20 NOTE — Patient Instructions (Signed)

## 2021-12-21 ENCOUNTER — Other Ambulatory Visit: Payer: Self-pay | Admitting: Psychiatry

## 2021-12-21 ENCOUNTER — Telehealth: Payer: Self-pay | Admitting: Podiatry

## 2021-12-21 DIAGNOSIS — F4310 Post-traumatic stress disorder, unspecified: Secondary | ICD-10-CM

## 2021-12-21 NOTE — Telephone Encounter (Signed)
Pt called asking for the exercises that she thought she was to have gotten yesterday, upon checking it was in the notes. I emailed a copy to the pt as she could not see it in her mychart account. I confirmed she got it as well.

## 2021-12-21 NOTE — Progress Notes (Signed)
  Subjective:  Patient ID: Kelli Logan, female    DOB: [birth year],  MRN: [id number]  Chief Complaint  Patient presents with   Foot Pain    NP - LEFT HEEL PAIN / HURTS TO APPLY PRESSURE    61 y.o. female presents with the above complaint. History confirmed with patient.   Objective:  Physical Exam: warm, good capillary refill, no trophic changes or ulcerative lesions, normal DP and PT pulses, and normal sensory exam. Left Foot: point tenderness over the heel pad  Radiographs: Multiple views x-ray of the left foot: no fracture, dislocation, swelling or degenerative changes noted Assessment:   1. Plantar fasciitis, left      Plan:  Patient was evaluated and treated and all questions answered.  Discussed the etiology and treatment options for plantar fasciitis including stretching, formal physical therapy, supportive shoegears such as a running shoe or sneaker, pre fabricated orthoses, injection therapy, and oral medications. We also discussed the role of surgical treatment of this for patients who do not improve after exhausting non-surgical treatment options.   -XR reviewed with patient -Educated patient on stretching and icing of the affected limb -I discussed corticosteroid injection she declined this and we will do this at next visit if she has not improved. -She has diclofenac 75 mg p.o. at home and she will use this twice daily  Return in about 6 weeks (around 01/31/2022) for recheck plantar fasciitis.

## 2022-01-12 ENCOUNTER — Other Ambulatory Visit: Payer: Self-pay | Admitting: Nurse Practitioner

## 2022-01-12 ENCOUNTER — Ambulatory Visit
Admission: RE | Admit: 2022-01-12 | Discharge: 2022-01-12 | Disposition: A | Discharge status: Home / Self Care | Payer: Medicare Other | Source: Ambulatory Visit | Attending: Nurse Practitioner | Admitting: Nurse Practitioner

## 2022-01-12 DIAGNOSIS — Z1231 Encounter for screening mammogram for malignant neoplasm of breast: Secondary | ICD-10-CM | POA: Diagnosis not present

## 2022-01-17 ENCOUNTER — Ambulatory Visit (INDEPENDENT_AMBULATORY_CARE_PROVIDER_SITE_OTHER): Payer: Medicare Other | Admitting: Psychiatry

## 2022-01-17 ENCOUNTER — Encounter: Payer: Self-pay | Admitting: Psychiatry

## 2022-01-17 VITALS — BP 122/87 | HR 96 | Temp 98.1°F | Ht 66.0 in | Wt 206.8 lb

## 2022-01-17 DIAGNOSIS — F424 Excoriation (skin-picking) disorder: Secondary | ICD-10-CM

## 2022-01-17 DIAGNOSIS — F4310 Post-traumatic stress disorder, unspecified: Secondary | ICD-10-CM

## 2022-01-17 DIAGNOSIS — F410 Panic disorder [episodic paroxysmal anxiety] without agoraphobia: Secondary | ICD-10-CM

## 2022-01-17 DIAGNOSIS — F4010 Social phobia, unspecified: Secondary | ICD-10-CM

## 2022-01-17 DIAGNOSIS — F3342 Major depressive disorder, recurrent, in full remission: Secondary | ICD-10-CM | POA: Diagnosis not present

## 2022-01-17 DIAGNOSIS — G4701 Insomnia due to medical condition: Secondary | ICD-10-CM

## 2022-01-17 MED ORDER — PRAZOSIN HCL 1 MG PO CAPS: 1.0000 mg | ORAL_CAPSULE | Freq: Every day | ORAL | 1 refills | Status: DC

## 2022-01-17 MED ORDER — MIRTAZAPINE 15 MG PO TABS: 7.5000 mg | ORAL_TABLET | Freq: Every day | ORAL | 0 refills | Status: DC

## 2022-01-17 NOTE — Patient Instructions (Signed)
[facility] [address], [place] [address] [phone] Office  Prazosin Capsules What is this medication? PRAZOSIN (PRA zoe sin) treats high blood pressure. It works by relaxing blood vessels, which decreases the amount of work the heart has to do. It belongs to a group of medications called alpha blockers. This medicine may be used for other purposes; ask your health care provider or pharmacist if you have questions. COMMON BRAND NAME(S): Minipress What should I tell my care team before I take this medication? They need to know if you have any of the following conditions: Kidney disease An unusual or allergic reaction to prazosin, other medications, foods, dyes, or preservatives Pregnant or trying to get pregnant Breast-feeding How should I use this medication? Take this medication by mouth. Take it as directed on the prescription label at the same time every day. Keep taking it unless your care team tells you to stop. Talk to your care team about the use of this medication in children. Special care may be needed. Overdosage: If you think you have taken too much of this medicine contact a poison control center or emergency room at once. NOTE: This medicine is only for you. Do not share this medicine with others. What if I miss a dose? If you miss a dose, take it as soon as you can. If it is almost time for your next dose, take only that dose. Do not take double or extra doses. What may interact with this medication? This medication may interact with the following: Diuretics Medications for high blood pressure Sildenafil Tadalafil Vardenafil This list may not describe all possible interactions. Give your health care provider a list of all the medicines, herbs, non-prescription drugs, or dietary supplements you use. Also tell them if you smoke, drink alcohol, or use illegal drugs. Some items may interact with your medicine. What should I watch for while using  this medication? Visit your care team for regular checks on your progress. Check your blood pressure regularly. Ask your care team what your blood pressure should be and when you should contact them. Drowsiness and dizziness are more likely to occur after the first dose, after an increase in dose, or during hot weather or exercise. These effects can decrease once your body adjusts to this medication. Do not drive, use machinery, or do anything that needs mental alertness until you know how this medication affects you. Do not stand or sit up quickly, especially if you are an older patient. This reduces the risk of dizzy or fainting spells. Alcohol can make you more drowsy and dizzy. Avoid alcoholic drinks. Do not treat yourself for coughs, colds or allergies without asking your care team for advice. Some ingredients can increase your blood pressure. Your mouth may get dry. Chewing sugarless gum or sucking hard candy, and drinking plenty of water may help. Contact your care team if the problem does not go away or is severe. For males, contact your care team right away if you have an erection that lasts longer than 4 hours or if it becomes painful. This may be a sign of a serious problem and must be treated right away to prevent permanent damage. What side effects may I notice from receiving this medication? Side effects that you should report to your care team as soon as possible: Allergic reactions--skin rash, itching, hives, swelling of the face, lips, tongue, or throat Low blood pressure--dizziness, feeling faint or lightheaded, blurry vision Prolonged or painful erection Side effects  that usually do not require medical attention (report to your care team if they continue or are bothersome): Blurry vision Drowsiness Fatigue Headache Heart palpitations--rapid, pounding, or irregular heartbeat Nausea Swelling of the ankles, hands, or feet This list may not describe all possible side effects. Call  your doctor for medical advice about side effects. You may report side effects to FDA at 1-800-FDA-1088. Where should I keep my medication? Keep out of the reach of children and pets. Store at room temperature between 15 and 30 degrees C (59 and 86 degrees F). Throw away any unused medication after the expiration date. NOTE: This sheet is a summary. It may not cover all possible information. If you have questions about this medicine, talk to your doctor, pharmacist, or health care provider.  2023 Elsevier/Gold Standard (2020-09-09 00:00:00)

## 2022-01-17 NOTE — Progress Notes (Signed)
[facility] MD OP Progress Note  01/17/2022 4:46 PM Kelli Logan  MRN:  [id number]  Chief Complaint:  Chief Complaint  Patient presents with   Follow-up   Medication Refill   Anxiety   Depression   HPI: Kelli Logan is a 62 year old Caucasian female, divorced, lives in [place], has a history of skin picking disorder, PTSD, MDD, panic disorder, social anxiety disorder, insomnia, history of borderline personality disorder, breast cancer per history, fibromyalgia, total hip replacement, obstructive sleep apnea on CPAP was evaluated in office today.  Patient today reports she had a stressful holiday season since she had to spend it alone.  All the fast food places like [employer] where she usually go to spend time were also not open all day .  Patient reports her birthday was 3 days after Christmas and she got a call from her son at the end of the day.  Did not get any gifts or cards and that made her sad.    Patient reports she continues to struggle with her sleep.  Has trouble with CPAP mask and has upcoming appointment to pick up a new mask looks forward to that.  Patient reports she also has a lot of nightmares.  While coming to this appointment today she [place].  That triggered flashbacks from her previous trauma.  Patient reports she is interested in trial of prazosin which was discussed during previous visits.  Patient continues to have skin picking, multiple superficial excoriation on her lower extremities.  Patient however reports she is not interested in psychotherapy or medications for the same since she believes it is due to the food that she eats.  She is unable to eat healthy food or cook food for herself due to her pain and financial issues.  She also believes caffeine likely contributing to it.  Could try to cut back on the caffeine use.  Patient does struggle with weight gain, likely also contributed by her medications.  Patient is on multiple  psychotropics including mirtazapine.  Patient would like to reduce the dosage of mirtazapine and gradually be tapered off.  Patient denies any suicidality, homicidality or perceptual disturbances.  Patient appeared to be alert, oriented to person place time situation.  Patient denies any other concerns today.  Visit Diagnosis:    ICD-10-CM   1. PTSD (post-traumatic stress disorder)  F43.10 mirtazapine (REMERON) 15 MG tablet    prazosin (MINIPRESS) 1 MG capsule    2. Skin-picking disorder  F42.4     3. MDD (major depressive disorder), recurrent, in full remission ([place])  F33.42     4. Panic disorder  F41.0     5. Social anxiety disorder  F40.10     6. Insomnia due to medical condition  G47.01 prazosin (MINIPRESS) 1 MG capsule   mood, pain, OSA - CPAP problem      Past Psychiatric History: I have reviewed past psychiatric history from progress note on 03/10/2018.  Past Medical History:  Past Medical History:  Diagnosis Date   Anxiety    Arthritis    Cancer ([place])    breast left   Depression    Fibromyalgia    GERD (gastroesophageal reflux disease)    Headache    History of kidney stones    h/o   Hypertension    Hypothyroidism    Sleep apnea    DOES NOT USE CPAP   Thyroid disease     Past Surgical History:  Procedure Laterality Date  APPENDECTOMY     BREAST BIOPSY Left few yrs ago   stereotactic bx in [place], benign   BREAST BIOPSY Left 12/26/2017   Affirm Bx [facility]- X-Clip   BREAST BIOPSY Left 01/14/2018   positive- pt had mastectomy   BREAST IMPLANT EXCHANGE Right 02/16/2021   Procedure: PLACEMENT OF BREAST IMPLANT;  Surgeon: Wallace Going, DO;  Location: [facility];  Service: Plastics;  Laterality: Right;  1.5 hour   BREAST RECONSTRUCTION WITH PLACEMENT OF TISSUE EXPANDER AND ALLODERM Left 04/08/2018   Procedure: LEFT BREAST IMMEDIATE  RECONSTRUCTION WITH EXPANDER AND FLEX HD;  Surgeon: Wallace Going, DO;  Location: [facility];  Service:  Plastics;  Laterality: Left;   COLONOSCOPY WITH PROPOFOL N/A 06/26/2018   Procedure: COLONOSCOPY WITH PROPOFOL;  Surgeon: Virgel Manifold, MD;  Location: [facility];  Service: Endoscopy;  Laterality: N/A;   ESOPHAGOGASTRODUODENOSCOPY (EGD) WITH PROPOFOL N/A 06/26/2018   Procedure: ESOPHAGOGASTRODUODENOSCOPY (EGD) WITH PROPOFOL;  Surgeon: Virgel Manifold, MD;  Location: [facility];  Service: Endoscopy;  Laterality: N/A;   EYE SURGERY Bilateral    lasik   FOOT BONE EXCISION     IMAGE GUIDED SINUS SURGERY  10/220   LIPOSUCTION Bilateral 02/16/2021   Procedure: LIPOSUCTION OF BILATERAL BREAST;  Surgeon: Wallace Going, DO;  Location: [facility];  Service: Plastics;  Laterality: Bilateral;   LIPOSUCTION WITH LIPOFILLING Left 04/30/2019   Procedure: Fat grafting to left breast;  Surgeon: Wallace Going, DO;  Location: [facility];  Service: Plastics;  Laterality: Left;  90 min, please   MASTECTOMY Left 04/08/2019   MASTECTOMY W/ SENTINEL NODE BIOPSY Left 04/08/2018   Procedure: MASTECTOMY WITH SENTINEL LYMPH NODE BIOPSY LEFT;  Surgeon: Fredirick Maudlin, MD;  Location: [facility];  Service: General;  Laterality: Left;   MASTOPEXY Right 08/04/2018   Procedure: MASTOPEXY;  Surgeon: Wallace Going, DO;  Location: [facility];  Service: Plastics;  Laterality: Right;  TOTAL CASE TIME SHOULD BE 3 HOURS, PLEASE   REMOVAL OF TISSUE EXPANDER AND PLACEMENT OF IMPLANT Left 08/04/2018   Procedure: REMOVAL OF TISSUE EXPANDER AND PLACEMENT OF IMPLANT;  Surgeon: Wallace Going, DO;  Location: [facility];  Service: Plastics;  Laterality: Left;   SCAR REVISION Left 04/30/2019   Procedure: release of scar contracture to left breast;  Surgeon: Wallace Going, DO;  Location: [facility];  Service: Plastics;  Laterality: Left;   TOTAL HIP ARTHROPLASTY Left 02/11/2020   Procedure: TOTAL HIP ARTHROPLASTY ANTERIOR APPROACH;  Surgeon: Hessie Knows, MD;  Location: [facility];  Service: Orthopedics;  Laterality: Left;   UMBILICAL HERNIA REPAIR  1983 and 1985    Family Psychiatric History: I have reviewed family psychiatric history from progress note on 03/10/2018.  Family History:  Family History  Problem Relation Age of Onset   Hypertension Mother    Lung cancer Maternal Grandmother    Heart disease Father    Alcohol abuse Father    Breast cancer Neg Hx    Colon cancer Neg Hx     Social History: I have reviewed social history from progress note on 03/10/2018. Social History   Socioeconomic History   Marital status: Divorced    Spouse name: Not on file   Number of children: 1   Years of education: Not on file   Highest education level: High school graduate  Occupational History   Not on file  Tobacco Use   Smoking status: Former    Packs/day:  1.00    Years: 2.00    Total pack years: 2.00    Types: Cigarettes    Quit date: 01/06/2017    Years since quitting: 5.0   Smokeless tobacco: Never  Vaping Use   Vaping Use: Former   Quit date: 11/27/2016  Substance and Sexual Activity   Alcohol use: Yes    Comment: rare beer    Drug use: Not Currently   Sexual activity: Not on file  Other Topics Concern   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 Quit smoking; ocassional alcohol.    Social Determinants of Health   Financial Resource Strain: High Risk (12/31/2019)   Overall Financial Resource Strain (CARDIA)    Difficulty of Paying Living Expenses: Very hard  Food Insecurity: Food Insecurity Present (12/31/2019)   Hunger Vital Sign    Worried About Running Out of Food in the Last Year: Often true    Ran Out of Food in the Last Year: Often true  Transportation Needs: Unmet Transportation Needs (03/10/2018)   PRAPARE - Hydrologist (Medical): Yes    Lack of  Transportation (Non-Medical): Yes  Physical Activity: Inactive (03/10/2018)   Exercise Vital Sign    Days of Exercise per Week: 0 days    Minutes of Exercise per Session: 0 min  Stress: Stress Concern Present (12/31/2019)   [place]    Feeling of Stress : Very much  Social Connections: Unknown (03/10/2018)   Social Connection and Isolation Panel [NHANES]    Frequency of Communication with Friends and Family: Not on file    Frequency of Social Gatherings with Friends and Family: Not on file    Attends Religious Services: Never    Marine scientist or Organizations: No    Attends Archivist Meetings: Never    Marital Status: Divorced    Allergies:  Allergies  Allergen Reactions   Tape Other (See Comments)    Paper tape after breast surgery/Burned skin   Tegaderm and other tape OK    Metabolic Disorder Labs: No results found for: "HGBA1C", "MPG" No results found for: "PROLACTIN" No results found for: "CHOL", "TRIG", "HDL", "CHOLHDL", "VLDL", "LDLCALC" No results found for: "TSH"  Therapeutic Level Labs: No results found for: "LITHIUM" No results found for: "VALPROATE" No results found for: "CBMZ"  Current Medications: Current Outpatient Medications  Medication Sig Dispense Refill   atorvastatin (LIPITOR) 20 MG tablet Take 20 mg by mouth at bedtime.     Biotin (SUPER BIOTIN) 5 MG TABS Take by mouth.     busPIRone (BUSPAR) 15 MG tablet TAKE 1 TABLET BY MOUTH 3 TIMES DAILY. 270 tablet 0   Coenzyme Q10 (COQ10) 100 MG CAPS Take 100 mg by mouth every evening.     DULoxetine (CYMBALTA) 30 MG capsule TAKE 1 CAPSULE BY MOUTH EVERY DAY 90 capsule 0   hydrochlorothiazide (HYDRODIURIL) 25 MG tablet Take 25 mg by mouth daily.      hydrOXYzine (VISTARIL) 50 MG capsule Take 50 mg by mouth daily as needed.     levothyroxine (SYNTHROID) 75 MCG tablet Take 75 mcg by mouth every other day. Alternating with 50 mcg  every other day-AM     levothyroxine (SYNTHROID, LEVOTHROID) 50 MCG tablet Take 50 mcg by mouth every other day. Alternating with 75 mcg every other day-AM  losartan (COZAAR) 100 MG tablet Take 100 mg by mouth daily. for high blood pressure     methocarbamol (ROBAXIN) 500 MG tablet Take 250-500 mg by mouth at bedtime as needed.     metoprolol (LOPRESSOR) 50 MG tablet Take 1 tablet (50 mg total) by mouth 2 (two) times daily. (Patient taking differently: Take 50 mg by mouth daily.) 60 tablet 0   Multiple Vitamin (MULTIVITAMIN WITH MINERALS) TABS tablet Take 1 tablet by mouth daily.     NONFORMULARY OR COMPOUNDED ITEM 1 Device by Other route as directed. CPAP-with the pressure setting of 10 cm H20 with heated humidity and ResMed AirFit F20 Full Face Mask , size small. 1 each 0   Omega-3 Fatty Acids (FISH OIL PO) Take 1 capsule by mouth daily.     omeprazole (PRILOSEC) 20 MG capsule Take 20 mg by mouth every morning.     prazosin (MINIPRESS) 1 MG capsule Take 1 capsule (1 mg total) by mouth at bedtime. 30 capsule 1   mirtazapine (REMERON) 15 MG tablet Take 0.5 tablets (7.5 mg total) by mouth at bedtime. FOR SLEEP 90 tablet 0   No current facility-administered medications for this visit.     Musculoskeletal: Strength & Muscle Tone: within normal limits Gait & Station: normal Patient leans: N/A  Psychiatric Specialty Exam: Review of Systems  Musculoskeletal:  Positive for back pain.  Psychiatric/Behavioral:  Positive for dysphoric mood and sleep disturbance. The patient is nervous/anxious.   All other systems reviewed and are negative.   Blood pressure 122/87, pulse 96, temperature 98.1 F (36.7 C), temperature source Oral, height '5\' 6"'$  (1.676 m), weight 206 lb 12.8 oz (93.8 kg), last menstrual period 02/05/2016.Body mass index is 33.38 kg/m.  General Appearance: Casual  Eye Contact:  Fair  Speech:  Clear and Coherent  Volume:  Normal  Mood:  Anxious and Depressed  Affect:  Congruent   Thought Process:  Goal Directed and Descriptions of Associations: Intact  Orientation:  Full (Time, Place, and Person)  Thought Content: Logical   Suicidal Thoughts:  No  Homicidal Thoughts:  No  Memory:  Immediate;   Fair Recent;   Fair Remote;   Fair  Judgement:  Fair  Insight:  Fair  Psychomotor Activity:  Normal  Concentration:  Concentration: Fair and Attention Span: Fair  Recall:  [employer] of Knowledge: Fair  Language: Fair  Akathisia:  No  Handed:  Right  AIMS (if indicated): not done  Assets:  Communication Skills Desire for Improvement Housing Social Support  ADL's:  Intact  Cognition: WNL  Sleep:  Poor   Screenings: AIMS    Flowsheet Row Video Visit from 06/22/2021 in [facility] Video Visit from 05/22/2021 in [facility] Total Score 0 0      GAD-7    [facility] Visit from 01/17/2022 in [facility] Video Visit from 11/16/2021 in [facility] Video Visit from 09/28/2021 in [facility] Video Visit from 07/26/2021 in [facility] Office Visit from 08/30/2020 in [facility]  Total GAD-7 Score '14 9 14 4 9      '$ PHQ2-9    [place] [place] Visit from 01/17/2022 in [facility] Video Visit from 09/28/2021 in [facility] Video Visit from 07/26/2021 in [facility] Video Visit from 06/22/2021 in [facility] Video Visit from 05/22/2021 in [facility]  PHQ-2  Total Score 1 1 0 0 0  PHQ-9 Total Score 5 -- -- -- --      Flowsheet Row Office Visit from 01/17/2022 in [facility] Video Visit from 11/16/2021 in [facility] Video Visit from 09/28/2021 in [facility] No Risk Low Risk Low Risk        Assessment and Plan: Kelli Logan is a 62 year old Caucasian female, divorced, has a history of multiple medical problems, chronic pain, continues to struggle with anxiety, skin picking, sleep problems, will benefit from the following plan.  Plan  PTSD-unstable Will reduce mirtazapine to 7.5 mg p.o. nightly Continue Cymbalta 30 mg p.o. daily.  Patient not interested in dosage increase of Cymbalta.  She is concerned about weight gain side effects. BuSpar 15 mg p.o. 3 times daily Add prazosin 1 mg p.o. nightly for nightmares Patient was referred for CBT in the past-she reports therapy makes her mood symptoms worse.  Attempted to provide education.  Skin picking disorder-unstable Continue BuSpar as prescribed Cymbalta 30 mg p.o. daily Referred for CBT-noncompliant  MDD in remission Cymbalta 30 mg p.o. daily  Panic disorder-improving Cymbalta and BuSpar as prescribed  Insomnia-unstable CPAP mask problem, has upcoming appointment. Add prazosin 1 mg p.o. nightly for nightmares Continue mirtazapine, dosage reduced as noted above. Continue hydroxyzine 50 mg at bedtime as needed.  Provided information for [facility] services -patient may benefit from support, patient to call call them for evaluation.  Follow-up in clinic in 1 month or sooner if needed.     This note was generated in part or whole with voice recognition software. Voice recognition is usually quite accurate but there are transcription errors that can and very often do occur. I apologize for any typographical errors that were not detected and corrected.     Ursula Alert, MD 01/19/2022, 8:19 AM

## 2022-01-20 ENCOUNTER — Other Ambulatory Visit: Payer: Self-pay | Admitting: Psychiatry

## 2022-01-20 DIAGNOSIS — F3342 Major depressive disorder, recurrent, in full remission: Secondary | ICD-10-CM

## 2022-01-20 DIAGNOSIS — F4310 Post-traumatic stress disorder, unspecified: Secondary | ICD-10-CM

## 2022-01-20 DIAGNOSIS — F410 Panic disorder [episodic paroxysmal anxiety] without agoraphobia: Secondary | ICD-10-CM

## 2022-01-20 DIAGNOSIS — F424 Excoriation (skin-picking) disorder: Secondary | ICD-10-CM

## 2022-01-20 DIAGNOSIS — F4010 Social phobia, unspecified: Secondary | ICD-10-CM

## 2022-01-29 ENCOUNTER — Encounter: Payer: Medicare Other | Admitting: Podiatry

## 2022-02-08 ENCOUNTER — Other Ambulatory Visit: Payer: Self-pay | Admitting: Psychiatry

## 2022-02-08 DIAGNOSIS — G4701 Insomnia due to medical condition: Secondary | ICD-10-CM

## 2022-02-08 DIAGNOSIS — F4310 Post-traumatic stress disorder, unspecified: Secondary | ICD-10-CM

## 2022-02-12 ENCOUNTER — Telehealth: Payer: Self-pay | Admitting: Psychiatry

## 2022-02-12 ENCOUNTER — Other Ambulatory Visit: Payer: Self-pay | Admitting: Psychiatry

## 2022-02-12 DIAGNOSIS — F4310 Post-traumatic stress disorder, unspecified: Secondary | ICD-10-CM

## 2022-02-12 NOTE — Telephone Encounter (Signed)
Patient called to reschedule appointment. Tried to half the mirtazapine 15 mg to 7.5 mg and was wide awake. Took prazosin '1mg'$  because she could not sleep and did not like way it made her feel. Tightness in chest, breathing weird and not feeling right. She is afraid to take medication now. Please call to advise.

## 2022-02-13 NOTE — Telephone Encounter (Signed)
Returned call to patient.  She reports she could not take the mirtazapine since it gave her side effects. Discussed to stop it. Advised to try the prazosin again since she is not sure whether it gave any side effects or whether it was just anxiety that caused her to have chest tightness when she took it.  She is willing to give it another chance.  She will try 10 will let writer know.

## 2022-02-14 ENCOUNTER — Telehealth: Payer: Self-pay

## 2022-02-14 NOTE — Telephone Encounter (Signed)
Noted  

## 2022-02-14 NOTE — Telephone Encounter (Signed)
pt left message that he having trouble breathing and she did not know if it is coming from prazosin. she states she is sick and is taking the predison. she states she not going to take the prazosin until after she gets better.

## 2022-02-19 ENCOUNTER — Telehealth: Payer: 59 | Admitting: Psychiatry

## 2022-02-21 ENCOUNTER — Encounter (HOSPITAL_BASED_OUTPATIENT_CLINIC_OR_DEPARTMENT_OTHER): Payer: Self-pay | Admitting: Otolaryngology

## 2022-02-21 ENCOUNTER — Other Ambulatory Visit: Payer: Self-pay

## 2022-02-22 ENCOUNTER — Encounter (HOSPITAL_BASED_OUTPATIENT_CLINIC_OR_DEPARTMENT_OTHER)
Admission: RE | Admit: 2022-02-22 | Discharge: 2022-02-22 | Disposition: A | Discharge status: Home / Self Care | Payer: 59 | Source: Ambulatory Visit | Attending: Otolaryngology | Admitting: Otolaryngology

## 2022-02-22 DIAGNOSIS — Z01812 Encounter for preprocedural laboratory examination: Secondary | ICD-10-CM | POA: Diagnosis present

## 2022-02-22 LAB — BASIC METABOLIC PANEL
Anion gap: 11 (ref 5–15)
BUN: 16 mg/dL (ref 8–23)
CO2: 26 mmol/L (ref 22–32)
Calcium: 9.3 mg/dL (ref 8.9–10.3)
Chloride: 100 mmol/L (ref 98–111)
Creatinine, Ser: 1.04 mg/dL — ABNORMAL HIGH (ref 0.44–1.00)
GFR, Estimated: >60 mL/min (ref >60)
Glucose, Bld: 108 mg/dL — ABNORMAL HIGH (ref 70–99)
Potassium: 3.4 mmol/L — ABNORMAL LOW (ref 3.5–5.1)
Sodium: 137 mmol/L (ref 135–145)

## 2022-02-22 IMAGING — DX DG HIP (WITH OR WITHOUT PELVIS) 2-3V*L*
2 series · 2 of 2 positions shown · non-contrast
Comparison: Fluoroscopy from earlier today

CLINICAL DATA: Postop left hip replacement.

EXAM:
DG HIP (WITH OR WITHOUT PELVIS) 2-3V LEFT

[hip ap]
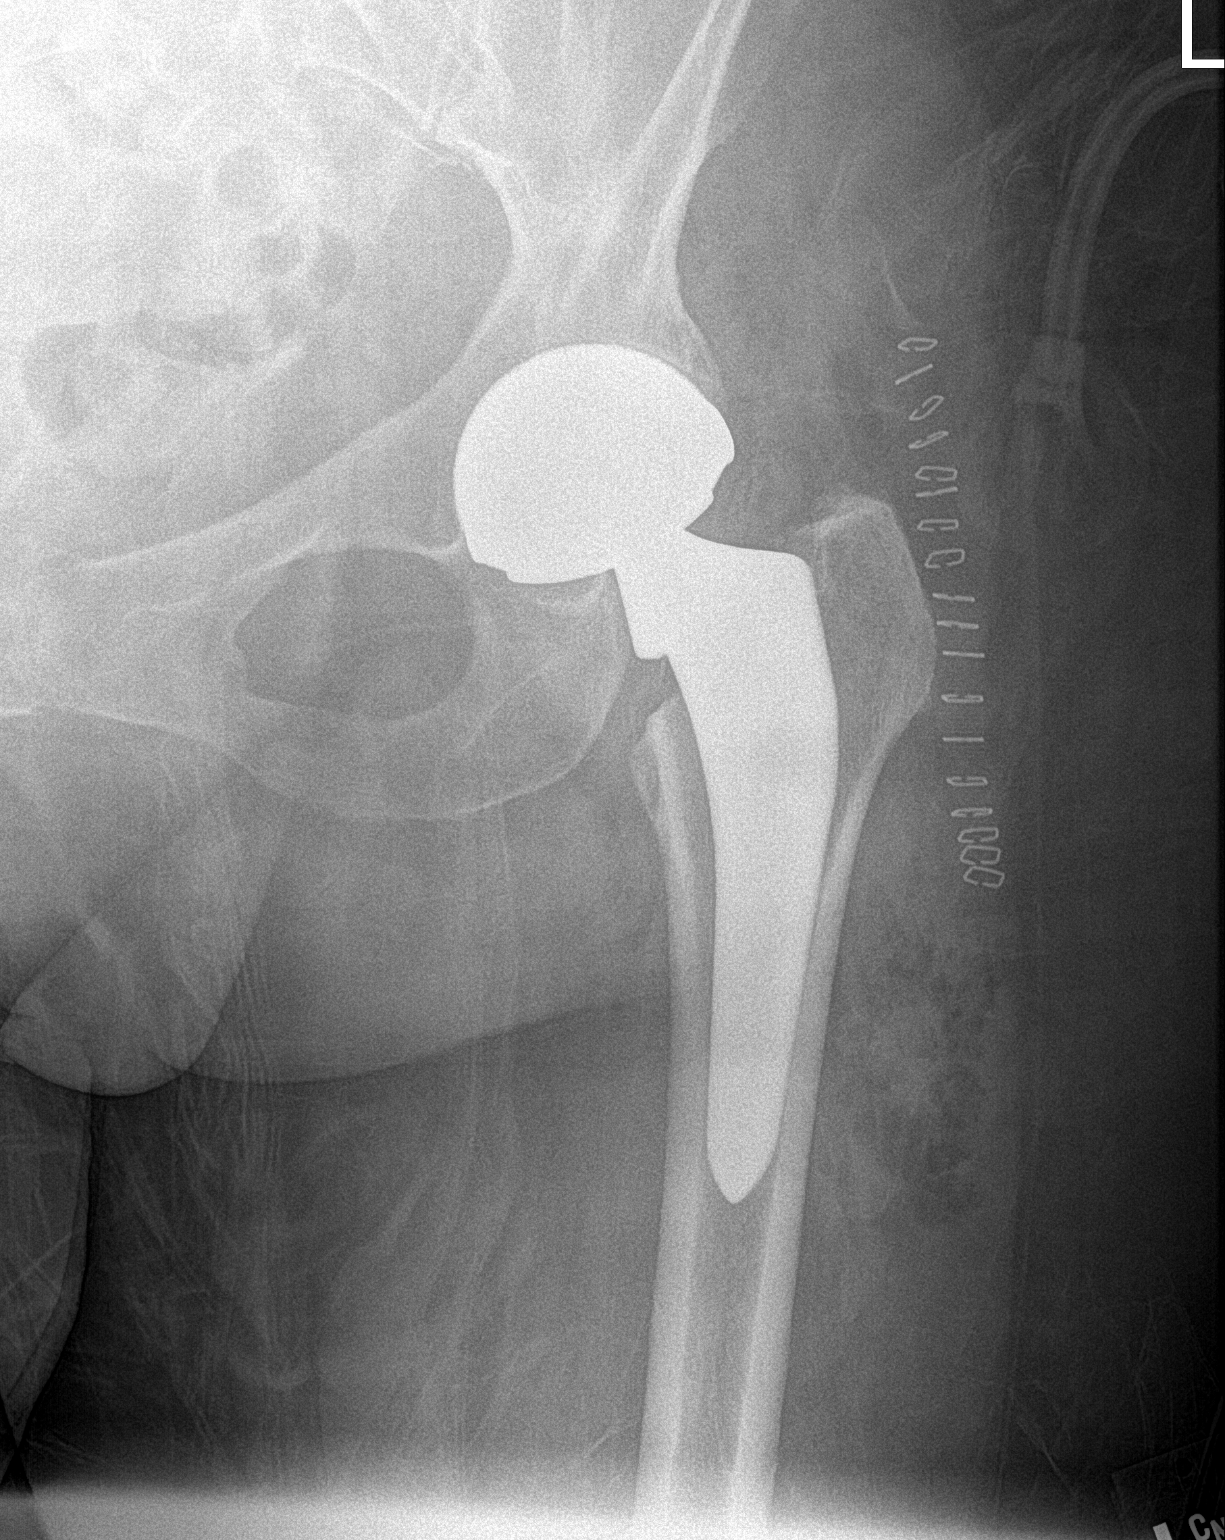

[hip lat]
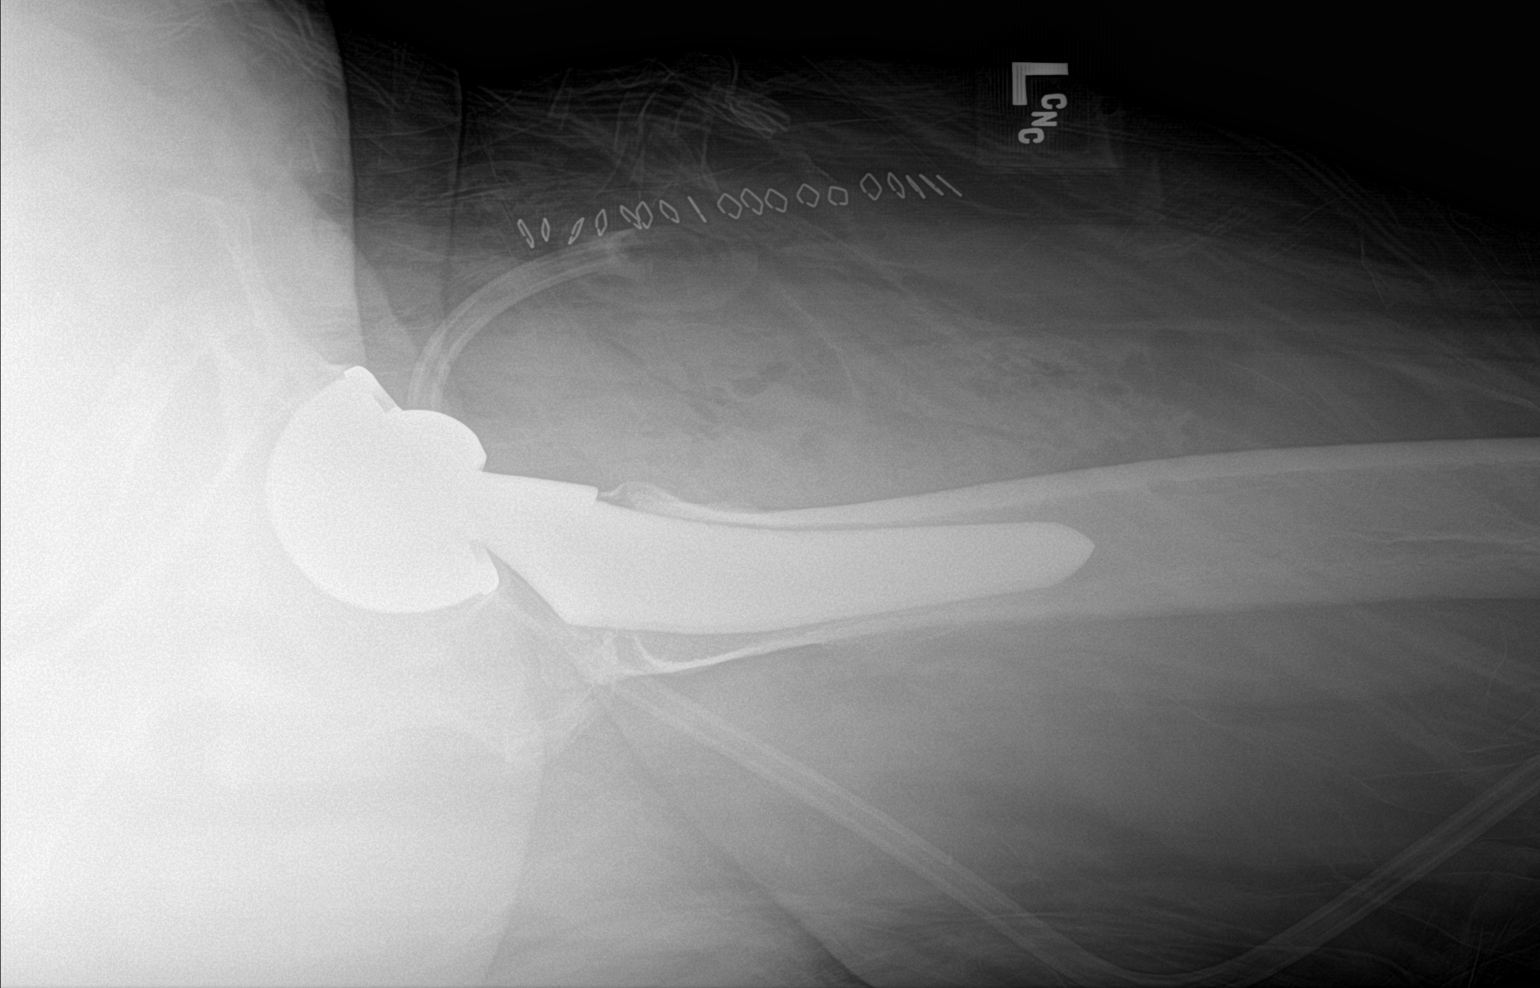

[2 of 2 positions shown; findings below may reference images not displayed]

FINDINGS: Total left hip arthroplasty which is well seated. Negative for
periprosthetic fracture.
IMPRESSION: No acute complicating feature of left hip arthroplasty.

## 2022-02-22 IMAGING — XA DG HIP (WITH PELVIS) OPERATIVE*L*
4 series · 15 of 16 positions shown · non-contrast
Comparison: Pelvic radiograph 12/18/2017

FLUOROSCOPY TIME:  0 minutes 18 seconds

Dose: 7.00 mGy

CLINICAL DATA: LEFT total hip arthroplasty

EXAM:
OPERATIVE LEFT HIP (WITH PELVIS IF PERFORMED) for VIEWS
TECHNIQUE: Fluoroscopic spot image(s) were submitted for interpretation
post-operatively.

[Series 1: ortho standard · 4 of 10 frames shown (1 of 4)]
[frame 2/10]
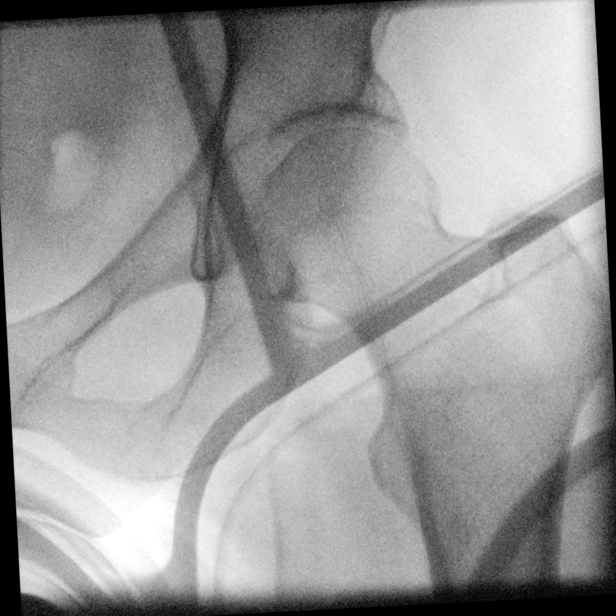
[frame 6/10]
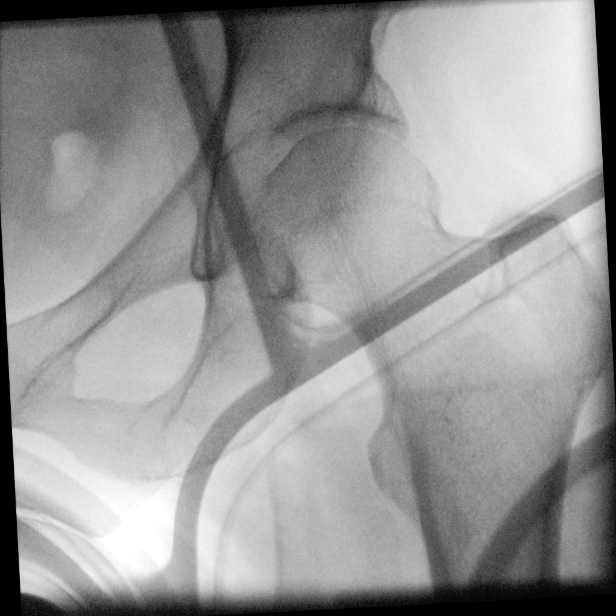
[frame 9/10]
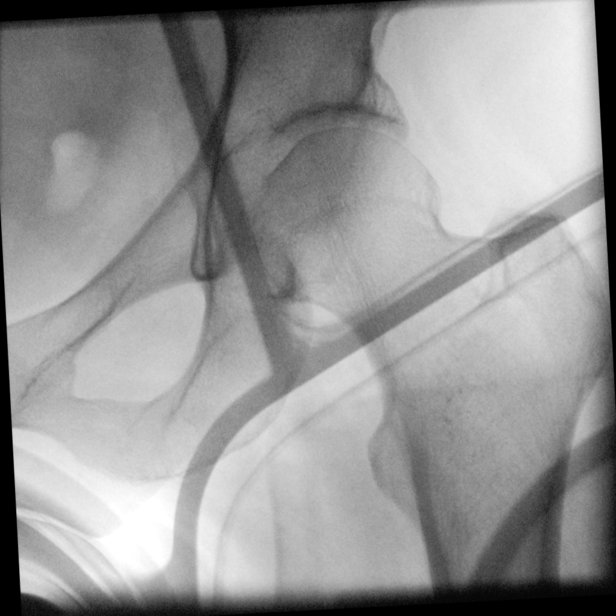
[frame 10/10]
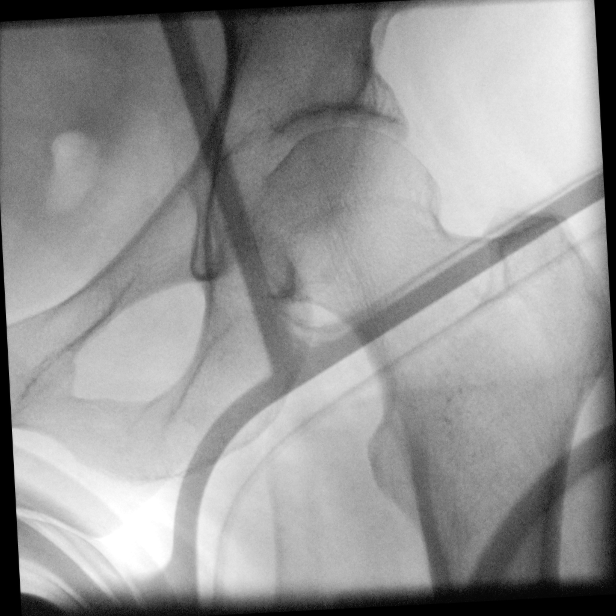

[Series 2: ortho standard · 3 of 9 frames shown (2 of 4)]
[frame 1/9]
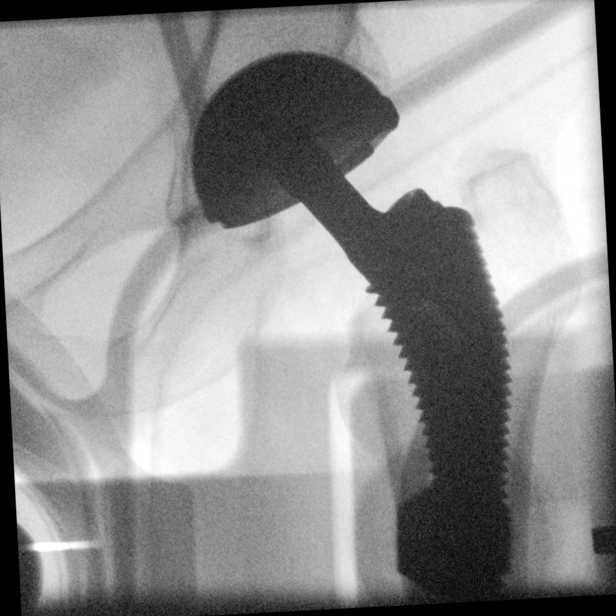
[frame 2/9]
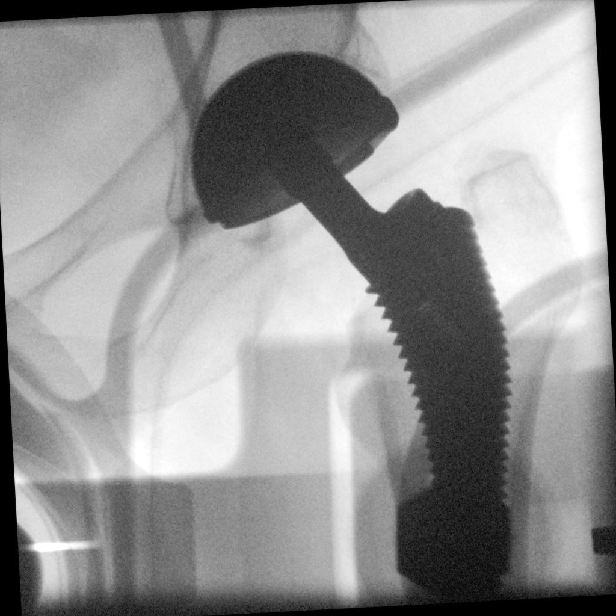
[frame 5/9]
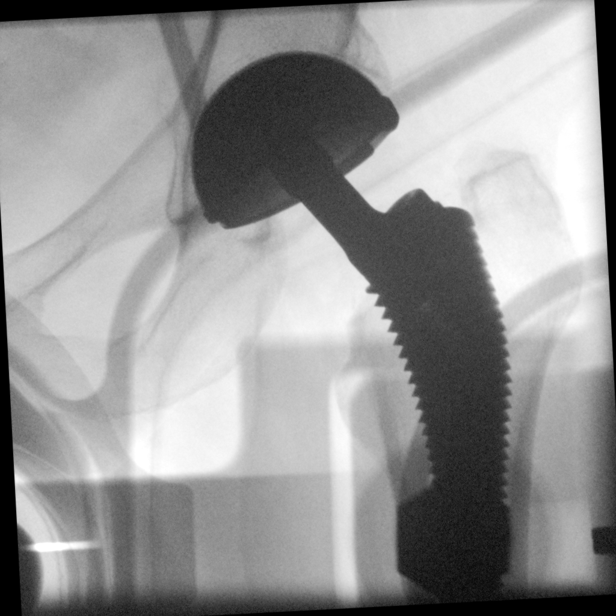

[Series 3: ortho standard · 4 of 9 frames shown (3 of 4)]
[frame 2/9]
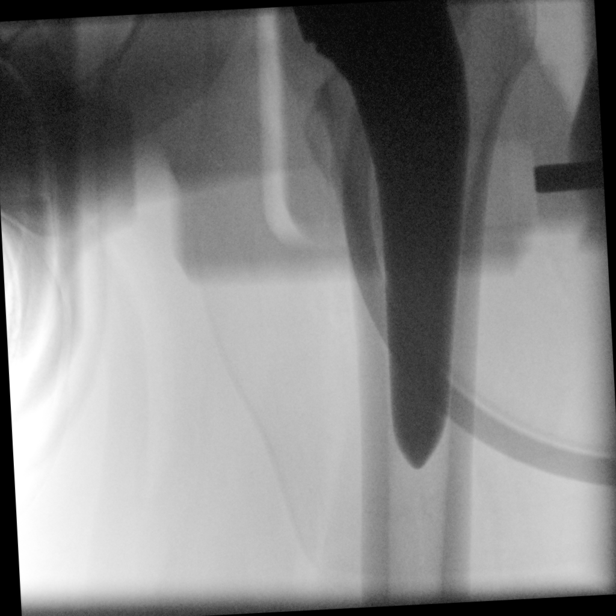
[frame 3/9]
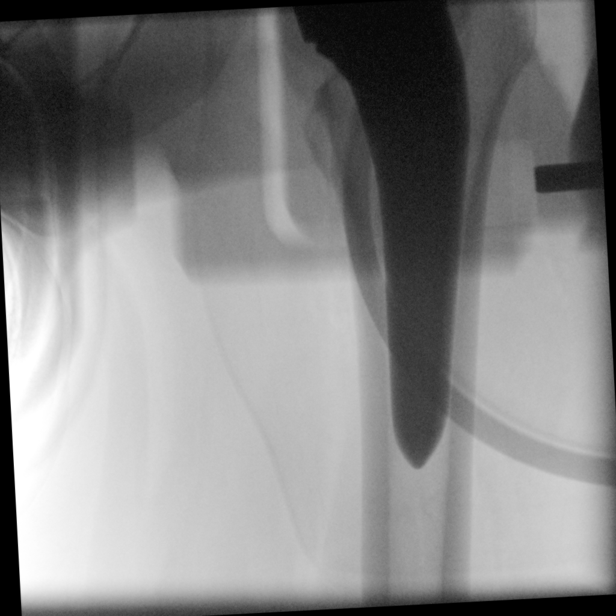
[frame 5/9]
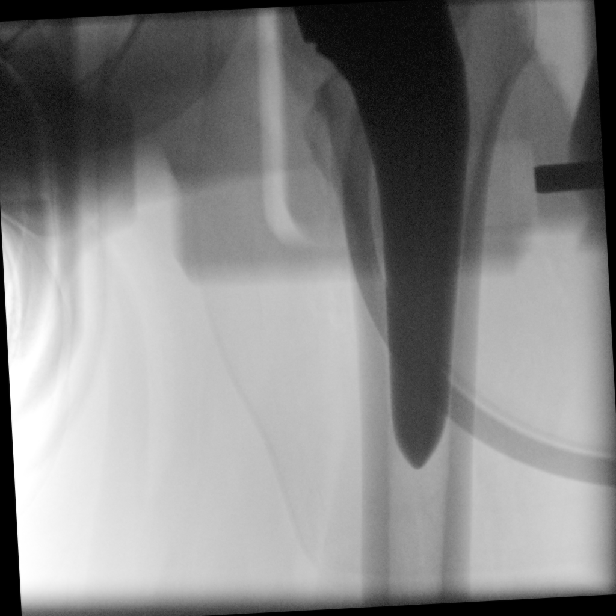
[frame 8/9]
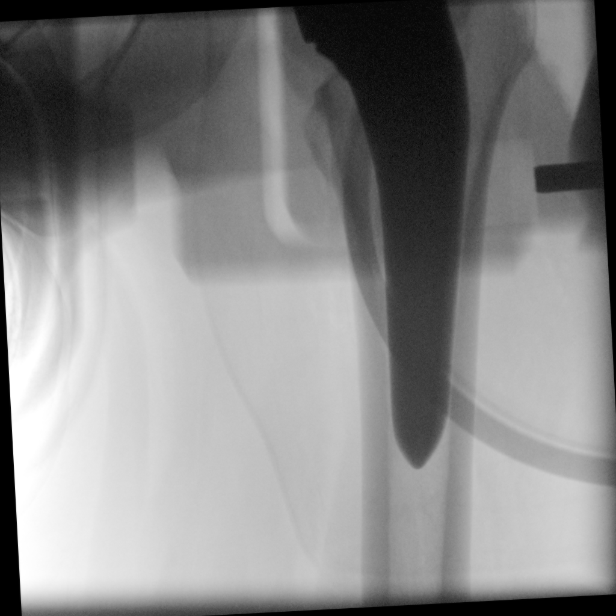

[Series 4: ortho standard · 4 of 10 frames shown (4 of 4)]
[frame 1/10]
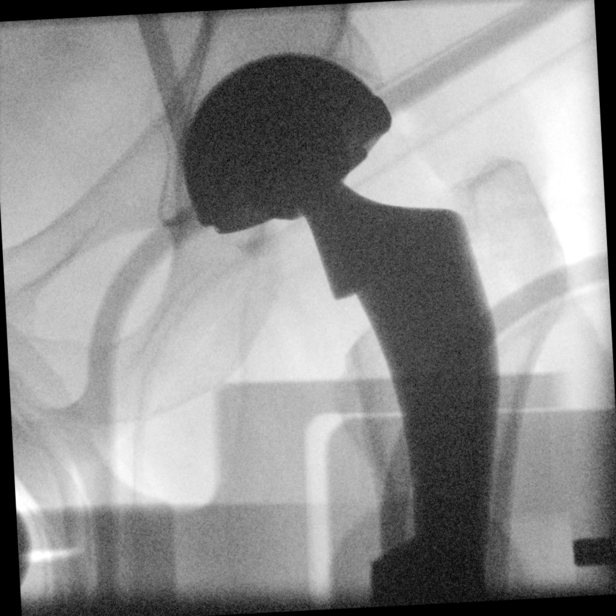
[frame 2/10]
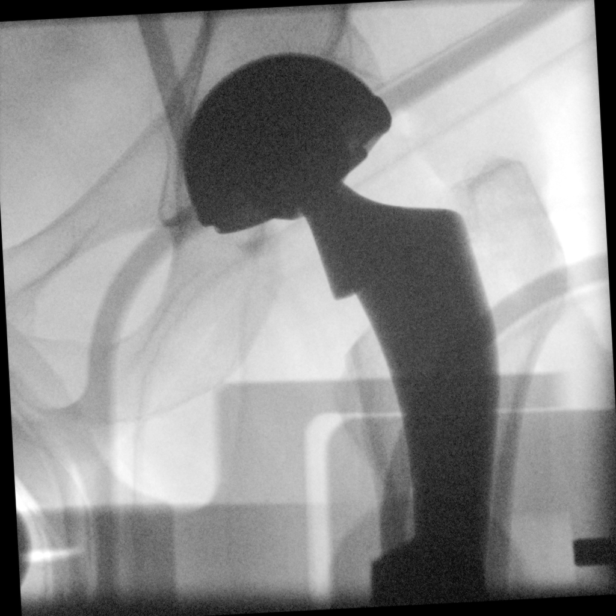
[frame 6/10]
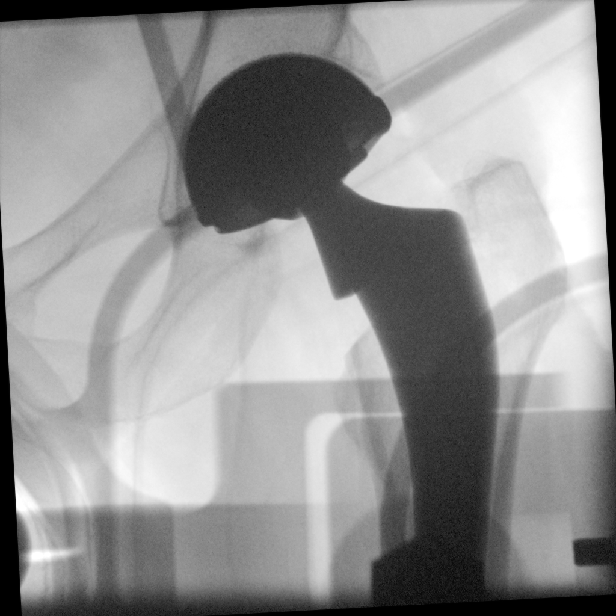
[frame 9/10]
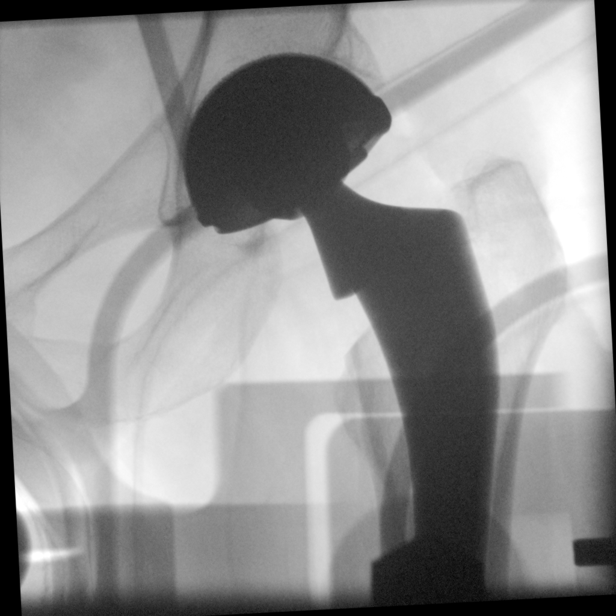

[15 of 16 positions shown; findings below may reference images not displayed]

FINDINGS: LEFT hip prosthesis placed.

No fracture or dislocation identified on submitted images.
IMPRESSION: LEFT hip prosthesis without acute complication.

## 2022-02-22 NOTE — Progress Notes (Signed)

## 2022-02-27 ENCOUNTER — Ambulatory Visit (HOSPITAL_BASED_OUTPATIENT_CLINIC_OR_DEPARTMENT_OTHER)
Admission: RE | Admit: 2022-02-27 | Discharge: 2022-02-27 | Disposition: A | Discharge status: Home / Self Care | Payer: 59 | Source: Ambulatory Visit | Attending: Otolaryngology | Admitting: Otolaryngology

## 2022-02-27 ENCOUNTER — Ambulatory Visit (HOSPITAL_BASED_OUTPATIENT_CLINIC_OR_DEPARTMENT_OTHER): Payer: 59 | Admitting: Certified Registered"

## 2022-02-27 ENCOUNTER — Other Ambulatory Visit: Payer: Self-pay

## 2022-02-27 ENCOUNTER — Encounter (HOSPITAL_BASED_OUTPATIENT_CLINIC_OR_DEPARTMENT_OTHER): Payer: Self-pay | Admitting: Otolaryngology

## 2022-02-27 ENCOUNTER — Encounter (HOSPITAL_BASED_OUTPATIENT_CLINIC_OR_DEPARTMENT_OTHER)
Admission: RE | Disposition: A | Discharge status: Home / Self Care | Payer: Self-pay | Source: Ambulatory Visit | Attending: Otolaryngology

## 2022-02-27 DIAGNOSIS — I10 Essential (primary) hypertension: Secondary | ICD-10-CM | POA: Diagnosis not present

## 2022-02-27 DIAGNOSIS — G4733 Obstructive sleep apnea (adult) (pediatric): Secondary | ICD-10-CM | POA: Diagnosis not present

## 2022-02-27 DIAGNOSIS — Z87891 Personal history of nicotine dependence: Secondary | ICD-10-CM | POA: Insufficient documentation

## 2022-02-27 DIAGNOSIS — Z853 Personal history of malignant neoplasm of breast: Secondary | ICD-10-CM | POA: Diagnosis not present

## 2022-02-27 DIAGNOSIS — E039 Hypothyroidism, unspecified: Secondary | ICD-10-CM | POA: Insufficient documentation

## 2022-02-27 DIAGNOSIS — K219 Gastro-esophageal reflux disease without esophagitis: Secondary | ICD-10-CM | POA: Insufficient documentation

## 2022-02-27 DIAGNOSIS — Z79899 Other long term (current) drug therapy: Secondary | ICD-10-CM

## 2022-02-27 DIAGNOSIS — Z01818 Encounter for other preprocedural examination: Secondary | ICD-10-CM

## 2022-02-27 DIAGNOSIS — M797 Fibromyalgia: Secondary | ICD-10-CM | POA: Insufficient documentation

## 2022-02-27 HISTORY — PX: DRUG INDUCED ENDOSCOPY: SHX6808

## 2022-02-27 SURGERY — DRUG INDUCED SLEEP ENDOSCOPY
Anesthesia: General | Site: Nose | Laterality: Right

## 2022-02-27 MED ORDER — OXYMETAZOLINE HCL 0.05 % NA SOLN
NASAL | Status: AC
  Filled 2022-02-27: qty 30

## 2022-02-27 MED ORDER — PROPOFOL 500 MG/50ML IV EMUL
INTRAVENOUS | Status: DC | PRN
  Administered 2022-02-27: 35 ug/kg/min via INTRAVENOUS

## 2022-02-27 MED ORDER — ONDANSETRON HCL 4 MG/2ML IJ SOLN
INTRAMUSCULAR | Status: DC | PRN
  Administered 2022-02-27: 4 mg via INTRAVENOUS

## 2022-02-27 MED ORDER — LACTATED RINGERS IV SOLN: INTRAVENOUS | Status: DC

## 2022-02-27 MED ORDER — OXYCODONE HCL 5 MG PO TABS: 5.0000 mg | ORAL_TABLET | Freq: Once | ORAL | Status: DC | PRN

## 2022-02-27 MED ORDER — OXYMETAZOLINE HCL 0.05 % NA SOLN
NASAL | Status: DC | PRN
  Administered 2022-02-27: 1 via TOPICAL

## 2022-02-27 MED ORDER — LIDOCAINE HCL (CARDIAC) PF 100 MG/5ML IV SOSY
PREFILLED_SYRINGE | INTRAVENOUS | Status: DC | PRN
  Administered 2022-02-27: 30 mg via INTRAVENOUS

## 2022-02-27 MED ORDER — ACETAMINOPHEN 325 MG PO TABS: 325.0000 mg | ORAL_TABLET | ORAL | Status: DC | PRN

## 2022-02-27 MED ORDER — ACETAMINOPHEN 160 MG/5ML PO SOLN: 325.0000 mg | ORAL | Status: DC | PRN

## 2022-02-27 MED ORDER — OXYCODONE HCL 5 MG/5ML PO SOLN: 5.0000 mg | Freq: Once | ORAL | Status: DC | PRN

## 2022-02-27 MED ORDER — ONDANSETRON HCL 4 MG/2ML IJ SOLN: 4.0000 mg | Freq: Once | INTRAMUSCULAR | Status: DC | PRN

## 2022-02-27 SURGICAL SUPPLY — 13 items
CANISTER SUCT 1200ML W/VALVE (MISCELLANEOUS) ×1 IMPLANT
GLOVE BIO SURGEON STRL SZ7.5 (GLOVE) ×1 IMPLANT
GLOVE BIOGEL PI IND STRL 6.5 (GLOVE) IMPLANT
GLOVE BIOGEL PI IND STRL 7.0 (GLOVE) IMPLANT
KIT CLEAN ENDO (MISCELLANEOUS) ×1 IMPLANT
NDL HYPO 27GX1-1/4 (NEEDLE) IMPLANT
NEEDLE HYPO 27GX1-1/4 (NEEDLE) IMPLANT
PATTIES SURGICAL .5 X3 (DISPOSABLE) ×1 IMPLANT
SHEET MEDIUM DRAPE 40X70 STRL (DRAPES) ×1 IMPLANT
SOL ANTI FOG 6CC (MISCELLANEOUS) ×1 IMPLANT
SYR CONTROL 10ML LL (SYRINGE) IMPLANT
TOWEL GREEN STERILE FF (TOWEL DISPOSABLE) ×1 IMPLANT
TUBE CONNECTING 20X1/4 (TUBING) ×1 IMPLANT

## 2022-02-27 NOTE — H&P (Signed)
Kelli Logan is an 62 y.o. female.   Chief Complaint: Sleep apnea HPI: 62 year old female with obstructive sleep apnea who has been unable to tolerate CPAP.  Past Medical History:  Diagnosis Date   Anxiety    Arthritis    Cancer ([place])    breast left   Depression    Fibromyalgia    GERD (gastroesophageal reflux disease)    Headache    History of kidney stones    h/o   Hypertension    Hypothyroidism    Sleep apnea    DOES NOT USE CPAP   Thyroid disease     Past Surgical History:  Procedure Laterality Date   APPENDECTOMY     BREAST BIOPSY Left few yrs ago   stereotactic bx in [place], benign   BREAST BIOPSY Left 12/26/2017   Affirm Bx [facility]- X-Clip   BREAST BIOPSY Left 01/14/2018   positive- pt had mastectomy   BREAST IMPLANT EXCHANGE Right 02/16/2021   Procedure: PLACEMENT OF BREAST IMPLANT;  Surgeon: Wallace Going, DO;  Location: [place];  Service: Plastics;  Laterality: Right;  1.5 hour   BREAST RECONSTRUCTION WITH PLACEMENT OF TISSUE EXPANDER AND ALLODERM Left 04/08/2018   Procedure: LEFT BREAST IMMEDIATE  RECONSTRUCTION WITH EXPANDER AND FLEX HD;  Surgeon: Wallace Going, DO;  Location: [facility];  Service: Plastics;  Laterality: Left;   COLONOSCOPY WITH PROPOFOL N/A 06/26/2018   Procedure: COLONOSCOPY WITH PROPOFOL;  Surgeon: Virgel Manifold, MD;  Location: [facility];  Service: Endoscopy;  Laterality: N/A;   ESOPHAGOGASTRODUODENOSCOPY (EGD) WITH PROPOFOL N/A 06/26/2018   Procedure: ESOPHAGOGASTRODUODENOSCOPY (EGD) WITH PROPOFOL;  Surgeon: Virgel Manifold, MD;  Location: [facility];  Service: Endoscopy;  Laterality: N/A;   EYE SURGERY Bilateral    lasik   FOOT BONE EXCISION     IMAGE GUIDED SINUS SURGERY  10/220   LIPOSUCTION Bilateral 02/16/2021   Procedure: LIPOSUCTION OF BILATERAL BREAST;  Surgeon: Wallace Going, DO;  Location: [place];  Service: Plastics;  Laterality: Bilateral;   LIPOSUCTION  WITH LIPOFILLING Left 04/30/2019   Procedure: Fat grafting to left breast;  Surgeon: Wallace Going, DO;  Location: [place];  Service: Plastics;  Laterality: Left;  90 min, please   MASTECTOMY Left 04/08/2019   MASTECTOMY W/ SENTINEL NODE BIOPSY Left 04/08/2018   Procedure: MASTECTOMY WITH SENTINEL LYMPH NODE BIOPSY LEFT;  Surgeon: Fredirick Maudlin, MD;  Location: [facility];  Service: General;  Laterality: Left;   MASTOPEXY Right 08/04/2018   Procedure: MASTOPEXY;  Surgeon: Wallace Going, DO;  Location: [facility];  Service: Plastics;  Laterality: Right;  TOTAL CASE TIME SHOULD BE 3 HOURS, PLEASE   REMOVAL OF TISSUE EXPANDER AND PLACEMENT OF IMPLANT Left 08/04/2018   Procedure: REMOVAL OF TISSUE EXPANDER AND PLACEMENT OF IMPLANT;  Surgeon: Wallace Going, DO;  Location: [facility];  Service: Plastics;  Laterality: Left;   SCAR REVISION Left 04/30/2019   Procedure: release of scar contracture to left breast;  Surgeon: Wallace Going, DO;  Location: [place];  Service: Plastics;  Laterality: Left;   TOTAL HIP ARTHROPLASTY Left 02/11/2020   Procedure: TOTAL HIP ARTHROPLASTY ANTERIOR APPROACH;  Surgeon: Hessie Knows, MD;  Location: [facility];  Service: Orthopedics;  Laterality: Left;   UMBILICAL HERNIA REPAIR  1983 and 1985    Family History  Problem Relation Age of Onset   Hypertension Mother    Lung cancer Maternal Grandmother  Heart disease Father    Alcohol abuse Father    Breast cancer Neg Hx    Colon cancer Neg Hx    Social History:  reports that she quit smoking about 5 years ago. Her smoking use included cigarettes. She has a 2.00 pack-year smoking history. She has never used smokeless tobacco. She reports current alcohol use. She reports that she does not currently use drugs.  Allergies:  Allergies  Allergen Reactions   Tape Other (See Comments)    Steri strips after breast surgery/Burned skin   Tegaderm and other tape OK     Medications Prior to Admission  Medication Sig Dispense Refill   atorvastatin (LIPITOR) 20 MG tablet Take 20 mg by mouth at bedtime.     Biotin (SUPER BIOTIN) 5 MG TABS Take by mouth.     busPIRone (BUSPAR) 15 MG tablet TAKE 1 TABLET BY MOUTH 3 TIMES DAILY. 270 tablet 0   Coenzyme Q10 (COQ10) 100 MG CAPS Take 100 mg by mouth every evening.     DULoxetine (CYMBALTA) 30 MG capsule TAKE 1 CAPSULE BY MOUTH EVERY DAY 90 capsule 0   hydrochlorothiazide (HYDRODIURIL) 25 MG tablet Take 25 mg by mouth daily.      hydrOXYzine (VISTARIL) 50 MG capsule TAKE 1 CAPSULE (50 MG TOTAL) BY MOUTH DAILY AS NEEDED. FOR SLEEP, ANXIETY 90 capsule 1   levothyroxine (SYNTHROID) 75 MCG tablet Take 75 mcg by mouth every other day. Alternating with 50 mcg every other day-AM     levothyroxine (SYNTHROID, LEVOTHROID) 50 MCG tablet Take 50 mcg by mouth every other day. Alternating with 75 mcg every other day-AM     losartan (COZAAR) 100 MG tablet Take 100 mg by mouth daily. for high blood pressure     methocarbamol (ROBAXIN) 500 MG tablet Take 250-500 mg by mouth at bedtime as needed.     metoprolol (LOPRESSOR) 50 MG tablet Take 1 tablet (50 mg total) by mouth 2 (two) times daily. (Patient taking differently: Take 50 mg by mouth daily.) 60 tablet 0   Multiple Vitamin (MULTIVITAMIN WITH MINERALS) TABS tablet Take 1 tablet by mouth daily.     NONFORMULARY OR COMPOUNDED ITEM 1 Device by Other route as directed. CPAP-with the pressure setting of 10 cm H20 with heated humidity and ResMed AirFit F20 Full Face Mask , size small. 1 each 0   Omega-3 Fatty Acids (FISH OIL PO) Take 1 capsule by mouth daily.     omeprazole (PRILOSEC) 20 MG capsule Take 20 mg by mouth every morning.     prazosin (MINIPRESS) 1 MG capsule TAKE 1 CAPSULE BY MOUTH AT BEDTIME. 90 capsule 1    No results found for this or any previous visit (from the past 48 hour(s)). No results found.  Review of Systems  All other systems reviewed and are  negative.   Blood pressure (!) 139/102, pulse 78, temperature 97.9 F (36.6 C), temperature source Tympanic, resp. rate 18, height 5' 6.25" (1.683 m), weight 93.8 kg, last menstrual period 02/05/2016, SpO2 100 %. Physical Exam Constitutional:      Appearance: Normal appearance. She is normal weight.  HENT:     Head: Normocephalic and atraumatic.     Right Ear: External ear normal.     Left Ear: External ear normal.     Nose: Nose normal.     Mouth/Throat:     Mouth: Mucous membranes are moist.     Pharynx: Oropharynx is clear.  Eyes:     Extraocular Movements:  Extraocular movements intact.     Conjunctiva/sclera: Conjunctivae normal.     Pupils: Pupils are equal, round, and reactive to light.  Cardiovascular:     Rate and Rhythm: Normal rate.  Pulmonary:     Effort: Pulmonary effort is normal.  Musculoskeletal:     Cervical back: Normal range of motion.  Skin:    General: Skin is warm and dry.  Neurological:     General: No focal deficit present.     Mental Status: She is alert and oriented to person, place, and time.  Psychiatric:        Mood and Affect: Mood normal.        Behavior: Behavior normal.        Thought Content: Thought content normal.        Judgment: Judgment normal.      Assessment/Plan Obstructive sleep apnea and BMI 33.13.  To OR for sleep endoscopy.  Melida Quitter, MD 02/27/2022, 1:05 PM

## 2022-02-27 NOTE — Anesthesia Postprocedure Evaluation (Signed)
Anesthesia Post Note  Patient: Kelli Logan  Procedure(s) Performed: DRUG INDUCED ENDOSCOPY (Right: Nose)     Patient location during evaluation: PACU Anesthesia Type: General Level of consciousness: awake Pain management: pain level controlled Vital Signs Assessment: post-procedure vital signs reviewed and stable Respiratory status: spontaneous breathing Cardiovascular status: stable Postop Assessment: no apparent nausea or vomiting Anesthetic complications: no  No notable events documented.  Last Vitals:  Vitals:   02/27/22 1132 02/27/22 1345  BP: (!) 139/102 114/63  Pulse: 78 80  Resp: 18 16  Temp: 36.6 C 36.5 C  SpO2: 100% 97%    Last Pain:  Vitals:   02/27/22 1345  TempSrc:   PainSc: 0-No pain                 Huston Foley

## 2022-02-27 NOTE — Discharge Instructions (Signed)

## 2022-02-27 NOTE — Op Note (Signed)
Preop diagnosis: Obstructive sleep apnea Postop diagnosis: same Procedure: Drug-induced sleep endoscopy Surgeon: Redmond Baseman Anesth: IV sedation Compl: None Findings: There is 50% anterior-posterior and 50% lateral wall collapse at the velum making her a candidate for hypoglossal nerve stimulator placement.  There was also anterior-posterior collapse at the tongue base. Description:  After discussing risks, benefits, and alternatives, the patient was brought to the operative suite and placed on the operative table in the supine position.  Anesthesia was induced and the patient was given light sedation to simulate natural sleep. When the proper level was reached, an Afrin-soaked pledget was placed in the right nasal passage for a couple of minutes and then removed.  The fiberoptic laryngoscope was then passed to view the pharynx and larynx.  Findings are noted above and the exam was recorded.  After completion, the scope was removed and the patient was returned to anesthesia for wakeup and was moved to the recovery room in stable condition.

## 2022-02-27 NOTE — Brief Op Note (Signed)
02/27/2022  1:39 PM  PATIENT:  Vollie Titmus Hokenson  62 y.o. female  PRE-OPERATIVE DIAGNOSIS:  Obstructive Sleep Apnea BMI 33.0-33.9,adult  POST-OPERATIVE DIAGNOSIS:  Obstructive Sleep Apnea BMI 33.0-33.9,adult  PROCEDURE:  Procedure(s): DRUG INDUCED ENDOSCOPY (Right)  SURGEON:  Surgeon(s) and Role:    Melida Quitter, MD - Primary  PHYSICIAN ASSISTANT:   ASSISTANTS: none   ANESTHESIA:   IV sedation  EBL:  None   BLOOD ADMINISTERED:none  DRAINS: none   LOCAL MEDICATIONS USED:  NONE  SPECIMEN:  No Specimen  DISPOSITION OF SPECIMEN:  N/A  COUNTS:  YES  TOURNIQUET:  * No tourniquets in log *  DICTATION: .Note written in EPIC  PLAN OF CARE: Discharge to home after PACU  PATIENT DISPOSITION:  PACU - hemodynamically stable.   Delay start of Pharmacological VTE agent (>24hrs) due to surgical blood loss or risk of bleeding: no

## 2022-02-27 NOTE — Anesthesia Preprocedure Evaluation (Addendum)
Anesthesia Evaluation  Patient identified by MRN, date of birth, ID band Patient awake    Reviewed: Allergy & Precautions, NPO status , Patient's Chart, lab work & pertinent test results  History of Anesthesia Complications Negative for: history of anesthetic complications  Airway Mallampati: II  TM Distance: >3 FB Neck ROM: Full    Dental no notable dental hx. (+) Dental Advisory Given   Pulmonary sleep apnea , Patient abstained from smoking., former smoker   Pulmonary exam normal        Cardiovascular Exercise Tolerance: Good hypertension, Pt. on medications and Pt. on home beta blockers Normal cardiovascular exam     Neuro/Psych  Headaches PSYCHIATRIC DISORDERS Anxiety Depression Bipolar Disorder      GI/Hepatic Neg liver ROS, hiatal hernia,GERD  Medicated and Controlled,,  Endo/Other  Hypothyroidism    Renal/GU negative Renal ROS  negative genitourinary   Musculoskeletal  (+) Arthritis , Osteoarthritis,  Fibromyalgia -  Abdominal   Peds negative pediatric ROS (+)  Hematology negative hematology ROS (+)   Anesthesia Other Findings Past Medical History: No date: Anxiety No date: Arthritis No date: Cancer [facility])     Comment:  breast left No date: Depression No date: Fibromyalgia No date: GERD (gastroesophageal reflux disease) No date: Headache No date: History of kidney stones     Comment:  h/o No date: Hypertension No date: Hypothyroidism No date: Sleep apnea     Comment:  DOES NOT USE CPAP No date: Thyroid disease  Reproductive/Obstetrics negative OB ROS                             Anesthesia Physical Anesthesia Plan  ASA: 2  Anesthesia Plan: General   Post-op Pain Management:    Induction: Intravenous  PONV Risk Score and Plan: 3 and Propofol infusion, TIVA and Treatment may vary due to age or medical condition  Airway Management Planned: Natural Airway and Nasal  Cannula  Additional Equipment: None  Intra-op Plan:   Post-operative Plan: Extubation in OR  Informed Consent: I have reviewed the patients History and Physical, chart, labs and discussed the procedure including the risks, benefits and alternatives for the proposed anesthesia with the patient or authorized representative who has indicated his/her understanding and acceptance.     Dental advisory given  Plan Discussed with: CRNA  Anesthesia Plan Comments:         Anesthesia Quick Evaluation

## 2022-02-27 NOTE — Anesthesia Procedure Notes (Signed)
Procedure Name: MAC Date/Time: 02/27/2022 1:28 PM  Performed by: Signe Colt, CRNAPre-anesthesia Checklist: Patient identified, Emergency Drugs available, Suction available, Patient being monitored and Timeout performed Patient Re-evaluated:Patient Re-evaluated prior to induction Oxygen Delivery Method: Simple face mask

## 2022-02-27 NOTE — Transfer of Care (Signed)
Immediate Anesthesia Transfer of Care Note  Patient: Kem Clasen Guilfoil  Procedure(s) Performed: DRUG INDUCED ENDOSCOPY (Right: Nose)  Patient Location: PACU  Anesthesia Type:MAC  Level of Consciousness: awake, alert , oriented, and patient cooperative  Airway & Oxygen Therapy: Patient Spontanous Breathing  Post-op Assessment: Report given to RN and Post -op Vital signs reviewed and stable  Post vital signs: Reviewed and stable  Last Vitals:  Vitals Value Taken Time  BP    Temp    Pulse 80 02/27/22 1345  Resp    SpO2 97 % 02/27/22 1345  Vitals shown include unvalidated device data.  Last Pain:  Vitals:   02/27/22 1132  TempSrc: Tympanic  PainSc: 0-No pain      Patients Stated Pain Goal: 4 ([id number])  Complications: No notable events documented.

## 2022-02-28 ENCOUNTER — Encounter (HOSPITAL_BASED_OUTPATIENT_CLINIC_OR_DEPARTMENT_OTHER): Payer: Self-pay | Admitting: Otolaryngology

## 2022-02-28 NOTE — Progress Notes (Signed)
Left message stating courtesy call and if any questions or concerns please call the doctors office.  

## 2022-03-19 ENCOUNTER — Telehealth: Payer: 59 | Admitting: Psychiatry

## 2022-03-25 ENCOUNTER — Other Ambulatory Visit: Payer: Self-pay | Admitting: Psychiatry

## 2022-03-25 DIAGNOSIS — F4310 Post-traumatic stress disorder, unspecified: Secondary | ICD-10-CM

## 2022-03-26 ENCOUNTER — Telehealth: Payer: Self-pay | Admitting: Psychiatry

## 2022-03-26 NOTE — Telephone Encounter (Signed)
I do not see an appointment scheduled for this patient.  Will notify staff to assist.

## 2022-03-29 ENCOUNTER — Other Ambulatory Visit: Payer: Self-pay | Admitting: Psychiatry

## 2022-03-29 DIAGNOSIS — F4310 Post-traumatic stress disorder, unspecified: Secondary | ICD-10-CM

## 2022-04-09 ENCOUNTER — Other Ambulatory Visit: Payer: Self-pay | Admitting: Psychiatry

## 2022-04-09 DIAGNOSIS — F4310 Post-traumatic stress disorder, unspecified: Secondary | ICD-10-CM

## 2022-04-16 ENCOUNTER — Encounter: Payer: Self-pay | Admitting: Psychiatry

## 2022-04-16 ENCOUNTER — Telehealth (INDEPENDENT_AMBULATORY_CARE_PROVIDER_SITE_OTHER): Payer: 59 | Admitting: Psychiatry

## 2022-04-16 DIAGNOSIS — F410 Panic disorder [episodic paroxysmal anxiety] without agoraphobia: Secondary | ICD-10-CM

## 2022-04-16 DIAGNOSIS — G4701 Insomnia due to medical condition: Secondary | ICD-10-CM

## 2022-04-16 DIAGNOSIS — F424 Excoriation (skin-picking) disorder: Secondary | ICD-10-CM | POA: Diagnosis not present

## 2022-04-16 DIAGNOSIS — F3342 Major depressive disorder, recurrent, in full remission: Secondary | ICD-10-CM | POA: Diagnosis not present

## 2022-04-16 DIAGNOSIS — F4310 Post-traumatic stress disorder, unspecified: Secondary | ICD-10-CM | POA: Diagnosis not present

## 2022-04-16 DIAGNOSIS — F4010 Social phobia, unspecified: Secondary | ICD-10-CM

## 2022-04-16 MED ORDER — BUSPIRONE HCL 10 MG PO TABS: 20.0000 mg | ORAL_TABLET | Freq: Three times a day (TID) | ORAL | 1 refills | Status: DC

## 2022-04-16 MED ORDER — DULOXETINE HCL 30 MG PO CPEP: 30.0000 mg | ORAL_CAPSULE | Freq: Every day | ORAL | 0 refills | Status: DC

## 2022-04-16 MED ORDER — MIRTAZAPINE 15 MG PO TABS: 15.0000 mg | ORAL_TABLET | Freq: Every day | ORAL | 1 refills | Status: DC
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
## 2022-05-10 ENCOUNTER — Other Ambulatory Visit: Payer: Self-pay | Admitting: Psychiatry

## 2022-05-10 DIAGNOSIS — F424 Excoriation (skin-picking) disorder: Secondary | ICD-10-CM

## 2022-05-10 DIAGNOSIS — F4010 Social phobia, unspecified: Secondary | ICD-10-CM

## 2022-05-10 DIAGNOSIS — F4310 Post-traumatic stress disorder, unspecified: Secondary | ICD-10-CM

## 2022-05-10 DIAGNOSIS — F410 Panic disorder [episodic paroxysmal anxiety] without agoraphobia: Secondary | ICD-10-CM

## 2022-05-18 ENCOUNTER — Institutional Professional Consult (permissible substitution): Payer: 59 | Admitting: Nurse Practitioner

## 2022-05-23 ENCOUNTER — Ambulatory Visit
Admission: RE | Admit: 2022-05-23 | Discharge: 2022-05-23 | Disposition: A | Discharge status: Home / Self Care | Payer: 59 | Source: Ambulatory Visit | Attending: Pulmonary Disease | Admitting: Pulmonary Disease

## 2022-05-23 ENCOUNTER — Ambulatory Visit
Admission: RE | Admit: 2022-05-23 | Discharge: 2022-05-23 | Disposition: A | Discharge status: Home / Self Care | Payer: 59 | Attending: Pulmonary Disease | Admitting: Pulmonary Disease

## 2022-05-23 ENCOUNTER — Encounter: Payer: Self-pay | Admitting: Pulmonary Disease

## 2022-05-23 ENCOUNTER — Ambulatory Visit (INDEPENDENT_AMBULATORY_CARE_PROVIDER_SITE_OTHER): Payer: 59 | Admitting: Pulmonary Disease

## 2022-05-23 VITALS — BP 124/84 | HR 88 | Temp 97.9°F | Ht 66.25 in | Wt 211.0 lb

## 2022-05-23 DIAGNOSIS — R0609 Other forms of dyspnea: Secondary | ICD-10-CM | POA: Diagnosis not present

## 2022-05-23 DIAGNOSIS — G4733 Obstructive sleep apnea (adult) (pediatric): Secondary | ICD-10-CM | POA: Diagnosis not present

## 2022-05-23 NOTE — Patient Instructions (Signed)
Chest xray today  Will arrange for a home sleep study and pulmonary function test  Will have [employer] refit your CPAP mask  You can change your CPAP setting to 8 cm water pressure  Follow up in 6 weeks
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
## 2022-05-31 ENCOUNTER — Ambulatory Visit: Payer: 59 | Attending: Pulmonary Disease

## 2022-05-31 DIAGNOSIS — R0609 Other forms of dyspnea: Secondary | ICD-10-CM | POA: Diagnosis present

## 2022-05-31 LAB — PULMONARY FUNCTION TEST ARMC ONLY
DL/VA % pred: 77 %
DL/VA: 3.18 ml/min/mmHg/L
DLCO unc % pred: 95 %
DLCO unc: 20.67 ml/min/mmHg
FEF 25-75 Post: 5.12 L/sec
FEF 25-75 Pre: 4.47 L/sec
FEF2575-%Change-Post: 14 %
FEF2575-%Pred-Post: 213 %
FEF2575-%Pred-Pre: 186 %
FEV1-%Change-Post: 3 %
FEV1-%Pred-Post: 134 %
FEV1-%Pred-Pre: 130 %
FEV1-Post: 3.67 L
FEV1-Pre: 3.56 L
FEV1FVC-%Change-Post: 4 %
FEV1FVC-%Pred-Pre: 108 %
FEV6-%Change-Post: 0 %
FEV6-%Pred-Post: 122 %
FEV6-%Pred-Pre: 123 %
FEV6-Post: 4.18 L
FEV6-Pre: 4.22 L
FEV6FVC-%Change-Post: 0 %
FEV6FVC-%Pred-Post: 103 %
FEV6FVC-%Pred-Pre: 103 %
FVC-%Change-Post: -1 %
FVC-%Pred-Post: 118 %
FVC-%Pred-Pre: 120 %
FVC-Post: 4.18 L
FVC-Pre: 4.24 L
Post FEV1/FVC ratio: 88 %
Post FEV6/FVC ratio: 100 %
Pre FEV1/FVC ratio: 84 %
Pre FEV6/FVC Ratio: 99 %
RV % pred: 144 %
RV: 3.09 L
TLC % pred: 136 %
TLC: 7.34 L

## 2022-05-31 MED ORDER — ALBUTEROL SULFATE (2.5 MG/3ML) 0.083% IN NEBU
2.5000 mg | INHALATION_SOLUTION | Freq: Once | RESPIRATORY_TRACT | Status: AC
  Administered 2022-05-31: 2.5 mg via RESPIRATORY_TRACT
  Filled 2022-05-31: qty 3

## 2022-06-05 ENCOUNTER — Other Ambulatory Visit: Payer: Self-pay

## 2022-06-05 MED ORDER — ALBUTEROL SULFATE HFA 108 (90 BASE) MCG/ACT IN AERS: 2.0000 | INHALATION_SPRAY | Freq: Four times a day (QID) | RESPIRATORY_TRACT | 6 refills | Status: AC | PRN

## 2022-06-27 ENCOUNTER — Telehealth: Payer: Self-pay | Admitting: Pulmonary Disease

## 2022-06-27 ENCOUNTER — Encounter (INDEPENDENT_AMBULATORY_CARE_PROVIDER_SITE_OTHER): Payer: 59

## 2022-06-27 DIAGNOSIS — G4733 Obstructive sleep apnea (adult) (pediatric): Secondary | ICD-10-CM

## 2022-06-27 NOTE — Telephone Encounter (Signed)
HST 06/07/22 >> AHI 10.1, SpO2 low 82%   Please inform her that her sleep study shows mild obstructive sleep apnea.  Please let her know she would need to have moderate to severe sleep apnea to be a candidate for an Inspire device.  She should continue using CPAP.

## 2022-07-06 ENCOUNTER — Encounter: Payer: Self-pay | Admitting: Nurse Practitioner

## 2022-07-06 ENCOUNTER — Ambulatory Visit (INDEPENDENT_AMBULATORY_CARE_PROVIDER_SITE_OTHER): Payer: 59 | Admitting: Nurse Practitioner

## 2022-07-06 VITALS — BP 142/92 | HR 77 | Temp 98.2°F | Ht 66.25 in | Wt 213.0 lb

## 2022-07-06 DIAGNOSIS — G4733 Obstructive sleep apnea (adult) (pediatric): Secondary | ICD-10-CM

## 2022-07-06 DIAGNOSIS — R0609 Other forms of dyspnea: Secondary | ICD-10-CM | POA: Diagnosis not present
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
## 2022-07-06 NOTE — Assessment & Plan Note (Signed)
Normal PFT and CXR. She will trial SABA. Could consider maintenance inhaler if she has benefit from use.

## 2022-07-06 NOTE — Patient Instructions (Signed)
Continue to use CPAP every night, minimum of 4-6 hours a night.  Change equipment every 30 days or as directed by DME. Wash your tubing with warm soap and water daily, hang to dry. Wash humidifier portion weekly. Use bottled, distilled water and change daily  Be aware of reduced alertness and do not drive or operate heavy machinery if experiencing this or drowsiness.  Notify if persistent daytime sleepiness occurs even with consistent use of CPAP.  Mask fitting - someone will call you to schedule this  Follow up in 6 months with Dr. Craige Cotta or Katie Wendell Fiebig,NP. If symptoms do not improve or worsen, please contact office for sooner follow up

## 2022-07-06 NOTE — Assessment & Plan Note (Signed)
Mild OSA on CPAP. Compliant with therapy. She is having some breakthrough events with residual AHI 5.1. No significant leaks. I am going to adjust her to auto setting 8-15 cmH2O. She will go for mask fitting at her local DME to look at nasal pillow or cradle, or memory foam full face. Encouraged to continue use. Receives benefit. Aware of safe driving practices.   Patient Instructions  Continue to use CPAP every night, minimum of 4-6 hours a night.  Change equipment every 30 days or as directed by DME. Wash your tubing with warm soap and water daily, hang to dry. Wash humidifier portion weekly. Use bottled, distilled water and change daily  Be aware of reduced alertness and do not drive or operate heavy machinery if experiencing this or drowsiness.  Notify if persistent daytime sleepiness occurs even with consistent use of CPAP.  Mask fitting - someone will call you to schedule this  Follow up in 6 months with Dr. Craige Cotta or Kelli Wanell Lorenzi,NP. If symptoms do not improve or worsen, please contact office for sooner follow up

## 2022-07-06 NOTE — Progress Notes (Signed)
Reviewed and agree with assessment/plan.   Coralyn Helling, MD [facility] Pulmonary/Critical Care 07/06/2022, 4:53 PM Pager:  [phone]

## 2022-07-11 NOTE — Telephone Encounter (Signed)
Sent pt mychart message w/ results, NFN att.

## 2022-07-16 ENCOUNTER — Encounter: Payer: Self-pay | Admitting: Psychiatry

## 2022-07-16 ENCOUNTER — Telehealth (INDEPENDENT_AMBULATORY_CARE_PROVIDER_SITE_OTHER): Payer: Medicaid Other | Admitting: Psychiatry

## 2022-07-16 DIAGNOSIS — F3342 Major depressive disorder, recurrent, in full remission: Secondary | ICD-10-CM

## 2022-07-16 DIAGNOSIS — F4310 Post-traumatic stress disorder, unspecified: Secondary | ICD-10-CM

## 2022-07-16 DIAGNOSIS — G4701 Insomnia due to medical condition: Secondary | ICD-10-CM

## 2022-07-16 DIAGNOSIS — F424 Excoriation (skin-picking) disorder: Secondary | ICD-10-CM | POA: Diagnosis not present

## 2022-07-16 DIAGNOSIS — F410 Panic disorder [episodic paroxysmal anxiety] without agoraphobia: Secondary | ICD-10-CM | POA: Diagnosis not present

## 2022-07-16 DIAGNOSIS — F4010 Social phobia, unspecified: Secondary | ICD-10-CM

## 2022-07-16 MED ORDER — DULOXETINE HCL 30 MG PO CPEP: 30.0000 mg | ORAL_CAPSULE | Freq: Every day | ORAL | 0 refills | Status: DC

## 2022-07-16 NOTE — Progress Notes (Signed)
Virtual Visit via Video Note  I connected with Kelli Logan on 07/16/22 at  4:00 PM EDT by a video enabled telemedicine application and verified that I am speaking with the correct person using two identifiers.  Location Provider Location : Remote Office  Patient Location : Home  Participants: Patient , Provider    I discussed the limitations of evaluation and management by telemedicine and the availability of in person appointments. The patient expressed understanding and agreed to proceed.   I discussed the assessment and treatment plan with the patient. The patient was provided an opportunity to ask questions and all were answered. The patient agreed with the plan and demonstrated an understanding of the instructions.   The patient was advised to call back or seek an in-person evaluation if the symptoms worsen or if the condition fails to improve as anticipated.    BH MD OP Progress Note  07/16/2022 5:12 PM Kelli Logan  MRN:  [id number]  Chief Complaint:  Chief Complaint  Patient presents with   Follow-up   Anxiety   Depression   Medication Refill   HPI: Kelli Logan is a 62 year old Caucasian female, divorced, lives in [place], has a history of PTSD, skin picking disorder, MDD, panic disorder, social anxiety disorder, insomnia, history of borderline personality disorder, obstructive sleep apnea on CPAP, fibromyalgia, total hip replacement per history, breast cancer per history was evaluated by telemedicine today.  Patient today reports she does have sleep issues because of the hot weather.  Her air conditioning at the apartment does not work too well and that is frustrating.  She does use her CPAP and reports she had another sleep study done which showed mild sleep apnea.  Patient reports she continues to have skin picking however does not believe it is due to anxiety or impulsivity.  She believes whenever she drinks caffeine she gets picky.  Patient was  advised to start psychotherapy sessions in the past however has been noncompliant.  She continues to disagree that therapy sessions can be beneficial.  Patient reports she is currently dating, however is having mixed feelings about it.  Patient denies any suicidality, homicidality or perceptual disturbances.  Currently compliant on medications like duloxetine, BuSpar, mirtazapine.  Denies any other concerns today.  Visit Diagnosis:    ICD-10-CM   1. PTSD (post-traumatic stress disorder)  F43.10 DULoxetine (CYMBALTA) 30 MG capsule    2. Skin-picking disorder  F42.4 DULoxetine (CYMBALTA) 30 MG capsule    3. MDD (major depressive disorder), recurrent, in full remission (HCC)  F33.42 DULoxetine (CYMBALTA) 30 MG capsule    4. Panic disorder  F41.0 DULoxetine (CYMBALTA) 30 MG capsule    5. Social anxiety disorder  F40.10 DULoxetine (CYMBALTA) 30 MG capsule    6. Insomnia due to medical condition  G47.01    PTSD ,OSA      Past Psychiatric History: I have reviewed past psychiatric history from progress note on 03/10/2018.  Past Medical History:  Past Medical History:  Diagnosis Date   Anxiety    Arthritis    Cancer (HCC)    breast left   Depression    Fibromyalgia    GERD (gastroesophageal reflux disease)    Headache    History of kidney stones    h/o   Hypertension    Hypothyroidism    Sleep apnea    DOES NOT USE CPAP   Thyroid disease     Past Surgical History:  Procedure Laterality Date   APPENDECTOMY  BREAST BIOPSY Left few yrs ago   stereotactic bx in [place], benign   BREAST BIOPSY Left 12/26/2017   Affirm Bx I[facility]- X-Clip   BREAST BIOPSY Left 01/14/2018   positive- pt had mastectomy   BREAST IMPLANT EXCHANGE Right 02/16/2021   Procedure: PLACEMENT OF BREAST IMPLANT;  Surgeon: Peggye Form, DO;  Location: [facility];  Service: Plastics;  Laterality: Right;  1.5 hour   BREAST RECONSTRUCTION WITH PLACEMENT OF TISSUE EXPANDER AND ALLODERM Left  04/08/2018   Procedure: LEFT BREAST IMMEDIATE  RECONSTRUCTION WITH EXPANDER AND FLEX HD;  Surgeon: Peggye Form, DO;  Location: [facility];  Service: Plastics;  Laterality: Left;   COLONOSCOPY WITH PROPOFOL N/A 06/26/2018   Procedure: COLONOSCOPY WITH PROPOFOL;  Surgeon: Pasty Spillers, MD;  Location: [facility];  Service: Endoscopy;  Laterality: N/A;   DRUG INDUCED ENDOSCOPY Right 02/27/2022   Procedure: DRUG INDUCED ENDOSCOPY;  Surgeon: Christia Reading, MD;  Location: [facility];  Service: ENT;  Laterality: Right;   ESOPHAGOGASTRODUODENOSCOPY (EGD) WITH PROPOFOL N/A 06/26/2018   Procedure: ESOPHAGOGASTRODUODENOSCOPY (EGD) WITH PROPOFOL;  Surgeon: Pasty Spillers, MD;  Location: [facility];  Service: Endoscopy;  Laterality: N/A;   EYE SURGERY Bilateral    lasik   FOOT BONE EXCISION     IMAGE GUIDED SINUS SURGERY  10/220   LIPOSUCTION Bilateral 02/16/2021   Procedure: LIPOSUCTION OF BILATERAL BREAST;  Surgeon: Peggye Form, DO;  Location: [facility];  Service: Plastics;  Laterality: Bilateral;   LIPOSUCTION WITH LIPOFILLING Left 04/30/2019   Procedure: Fat grafting to left breast;  Surgeon: Peggye Form, DO;  Location: [facility];  Service: Plastics;  Laterality: Left;  90 min, please   MASTECTOMY Left 04/08/2019   MASTECTOMY W/ SENTINEL NODE BIOPSY Left 04/08/2018   Procedure: MASTECTOMY WITH SENTINEL LYMPH NODE BIOPSY LEFT;  Surgeon: Duanne Guess, MD;  Location: [facility];  Service: General;  Laterality: Left;   MASTOPEXY Right 08/04/2018   Procedure: MASTOPEXY;  Surgeon: Peggye Form, DO;  Location: [facility];  Service: Plastics;  Laterality: Right;  TOTAL CASE TIME SHOULD BE 3 HOURS, PLEASE   REMOVAL OF TISSUE EXPANDER AND PLACEMENT OF IMPLANT Left 08/04/2018   Procedure: REMOVAL OF TISSUE EXPANDER AND PLACEMENT OF IMPLANT;  Surgeon: Peggye Form, DO;  Location: [facility];  Service: Plastics;   Laterality: Left;   SCAR REVISION Left 04/30/2019   Procedure: release of scar contracture to left breast;  Surgeon: Peggye Form, DO;  Location: [facility];  Service: Plastics;  Laterality: Left;   TOTAL HIP ARTHROPLASTY Left 02/11/2020   Procedure: TOTAL HIP ARTHROPLASTY ANTERIOR APPROACH;  Surgeon: Kennedy Bucker, MD;  Location: [facility];  Service: Orthopedics;  Laterality: Left;   UMBILICAL HERNIA REPAIR  1983 and 1985    Family Psychiatric History: I have reviewed family psychiatric history from progress note on 03/10/2018.  Family History:  Family History  Problem Relation Age of Onset   Hypertension Mother    Lung cancer Maternal Grandmother    Heart disease Father    Alcohol abuse Father    Breast cancer Neg Hx    Colon cancer Neg Hx     Social History: I have reviewed social history from progress note on 03/10/2018. Social History   Socioeconomic History   Marital status: Divorced    Spouse name: Not on file   Number of children: 1   Years of education: Not on file   Highest  education level: High school graduate  Occupational History   Not on file  Tobacco Use   Smoking status: Former    Packs/day: 1.00    Years: 2.00    Additional pack years: 0.00    Total pack years: 2.00    Types: Cigarettes    Quit date: 01/06/2017    Years since quitting: 5.5   Smokeless tobacco: Never  Vaping Use   Vaping Use: Former   Quit date: 11/27/2016  Substance and Sexual Activity   Alcohol use: Yes    Comment: rare beer    Drug use: Not Currently   Sexual activity: Not on file  Other Topics Concern   Not on file  Social History Narrative   Home on disability [pysche problems]; transportation issues; worked in Chief Financial Officer. From [place]; moved after separation. Quit smoking; ocassional alcohol.    Social Determinants of Health   Financial Resource Strain: High Risk (12/31/2019)   Overall Financial Resource Strain (CARDIA)    Difficulty of Paying  Living Expenses: Very hard  Food Insecurity: Food Insecurity Present (12/31/2019)   Hunger Vital Sign    Worried About Running Out of Food in the Last Year: Often true    Ran Out of Food in the Last Year: Often true  Transportation Needs: Unmet Transportation Needs (03/10/2018)   PRAPARE - Administrator, Civil Service (Medical): Yes    Lack of Transportation (Non-Medical): Yes  Physical Activity: Inactive (03/10/2018)   Exercise Vital Sign    Days of Exercise per Week: 0 days    Minutes of Exercise per Session: 0 min  Stress: Stress Concern Present (12/31/2019)   [employer] of Occupational Health - Occupational Stress Questionnaire    Feeling of Stress : Very much  Social Connections: Unknown (03/10/2018)   Social Connection and Isolation Panel [NHANES]    Frequency of Communication with Friends and Family: Not on file    Frequency of Social Gatherings with Friends and Family: Not on file    Attends Religious Services: Never    Database administrator or Organizations: No    Attends Banker Meetings: Never    Marital Status: Divorced    Allergies:  Allergies  Allergen Reactions   Tape Other (See Comments)    Steri strips after breast surgery/Burned skin   Tegaderm and other tape OK    Metabolic Disorder Labs: No results found for: "HGBA1C", "MPG" No results found for: "PROLACTIN" No results found for: "CHOL", "TRIG", "HDL", "CHOLHDL", "VLDL", "LDLCALC" No results found for: "TSH"  Therapeutic Level Labs: No results found for: "LITHIUM" No results found for: "VALPROATE" No results found for: "CBMZ"  Current Medications: Current Outpatient Medications  Medication Sig Dispense Refill   albuterol (VENTOLIN HFA) 108 (90 Base) MCG/ACT inhaler Inhale 2 puffs into the lungs every 6 (six) hours as needed for wheezing or shortness of breath. 8 g 6   atorvastatin (LIPITOR) 20 MG tablet Take 20 mg by mouth at bedtime.     Biotin (SUPER BIOTIN) 5 MG TABS  Take by mouth.     busPIRone (BUSPAR) 10 MG tablet TAKE 2 TABLETS BY MOUTH 3 TIMES DAILY. 540 tablet 1   Coenzyme Q10 (COQ10) 100 MG CAPS Take 100 mg by mouth every evening.     DULoxetine (CYMBALTA) 30 MG capsule Take 1 capsule (30 mg total) by mouth daily. 90 capsule 0   hydrochlorothiazide (HYDRODIURIL) 25 MG tablet Take 25 mg by mouth daily.  hydrOXYzine (VISTARIL) 50 MG capsule TAKE 1 CAPSULE (50 MG TOTAL) BY MOUTH DAILY AS NEEDED. FOR SLEEP, ANXIETY 90 capsule 1   levothyroxine (SYNTHROID) 75 MCG tablet Take 75 mcg by mouth every other day. Alternating with 50 mcg every other day-AM     levothyroxine (SYNTHROID, LEVOTHROID) 50 MCG tablet Take 50 mcg by mouth every other day. Alternating with 75 mcg every other day-AM     losartan (COZAAR) 100 MG tablet Take 100 mg by mouth daily. for high blood pressure     methocarbamol (ROBAXIN) 500 MG tablet Take 250-500 mg by mouth at bedtime as needed. (Patient not taking: Reported on 05/23/2022)     metoprolol (LOPRESSOR) 50 MG tablet Take 1 tablet (50 mg total) by mouth 2 (two) times daily. (Patient taking differently: Take 50 mg by mouth daily.) 60 tablet 0   mirtazapine (REMERON) 15 MG tablet Take 1 tablet (15 mg total) by mouth at bedtime. 90 tablet 1   Multiple Vitamin (MULTIVITAMIN WITH MINERALS) TABS tablet Take 1 tablet by mouth daily.     NONFORMULARY OR COMPOUNDED ITEM 1 Device by Other route as directed. CPAP-with the pressure setting of 10 cm H20 with heated humidity and ResMed AirFit F20 Full Face Mask , size small. 1 each 0   Omega-3 Fatty Acids (FISH OIL PO) Take 1 capsule by mouth daily.     omeprazole (PRILOSEC) 20 MG capsule Take 20 mg by mouth every morning.     No current facility-administered medications for this visit.     Musculoskeletal: Strength & Muscle Tone:  UTA Gait & Station:  Seated Patient leans: N/A  Psychiatric Specialty Exam: Review of Systems  Psychiatric/Behavioral:  Positive for sleep disturbance.      Last menstrual period 02/05/2016.There is no height or weight on file to calculate BMI.  General Appearance: Fairly Groomed  Eye Contact:  Fair  Speech:  Clear and Coherent  Volume:  Normal  Mood:  Euthymic  Affect:  Congruent  Thought Process:  Goal Directed and Descriptions of Associations: Intact  Orientation:  Full (Time, Place, and Person)  Thought Content: Logical   Suicidal Thoughts:  No  Homicidal Thoughts:  No  Memory:  Immediate;   Fair Recent;   Fair Remote;   Fair  Judgement:  Fair  Insight:  Fair  Psychomotor Activity:  Normal  Concentration:  Concentration: Fair and Attention Span: Fair  Recall:  [employer] of Knowledge: Fair  Language: Fair  Akathisia:  No  Handed:  Right  AIMS (if indicated): not done  Assets:  Communication Skills Desire for Improvement Housing Social Support  ADL's:  Intact  Cognition: WNL  Sleep:   restless   Screenings: AIMS    Flowsheet Row Video Visit from 06/22/2021 in [facility] Video Visit from 05/22/2021 in [facility]  AIMS Total Score 0 0      GAD-7    Flowsheet Row Office Visit from 01/17/2022 in [facility] Video Visit from 11/16/2021 in [facility] Video Visit from 09/28/2021 in [facility] Video Visit from 07/26/2021 in [facility] Office Visit from 08/30/2020 in [facility]  Total GAD-7 Score 14 9 14 4 9       PHQ2-9    Flowsheet Row Office Visit from 01/17/2022 in [facility] Video Visit from 09/28/2021 in [facility] Video Visit from 07/26/2021 in [facility] Video Visit from 06/22/2021 in [facility] Video Visit from 05/22/2021 in [facility]  PHQ-2 Total Score 1 1 0 0 0  PHQ-9 Total Score 5 -- -- -- --      Flowsheet Row Video Visit from 07/16/2022 in [facility] Video Visit from 04/16/2022 in [facility] Office Visit from 01/17/2022 in [facility]  C-SSRS RISK CATEGORY No Risk No Risk No Risk        Assessment and Plan: Auna Vereen is a 62 year old Caucasian female, divorced, has a history of multiple medical problems, chronic pain, currently with sleep issues mostly because of external/environmental factors, noncompliant with recommendations for psychotherapy sessions to manage skin picking, will benefit from the following plan.  Plan PTSD-some improvement Mirtazapine 15 mg p.o. nightly Cymbalta 30 mg p.o. daily BuSpar 20 mg p.o. 3 times daily Patient recommended to start CBT-noncompliant.  Skin picking disorder-unstable Patient believes skin picking due to her caffeine intake.  Patient has been noncompliant with psychotherapy referral. BuSpar 20 mg p.o. 3 times daily Cymbalta 30 mg p.o. daily.  MDD in remission Cymbalta 30 mg p.o. daily  Insomnia/obstructive sleep apnea on CPAP-unstable Encouraged to continue CPAP Mirtazapine 15 mg p.o. nightly Hydroxyzine 50 mg at bedtime  Patient was provided resources for psychotherapy as well as resources for [employer] services.  Patient noncompliant.  Follow-up in clinic in 3 to 4 months or sooner if needed.  Collaboration of Care: Collaboration of Care: Referral or follow-up with counselor/therapist AEB patient encouraged to establish care with therapist.  Patient/Guardian was advised Release of Information must be obtained prior to any record release in order to collaborate their care with an outside provider. Patient/Guardian was advised  if they have not already done so to contact the registration department to sign all necessary forms in order for [place] to release information regarding their care.   Consent: Patient/Guardian gives verbal consent for treatment and assignment of benefits for services provided during this visit. Patient/Guardian expressed understanding and agreed to proceed.  This note was generated in part or whole with voice recognition software. Voice recognition is usually quite accurate but there are transcription errors that can and very often do occur. I apologize for any typographical errors that were not detected and corrected.     Jomarie Longs, MD 07/16/2022, 5:12 PM

## 2022-08-09 ENCOUNTER — Telehealth: Payer: Self-pay | Admitting: Nurse Practitioner

## 2022-08-09 NOTE — Telephone Encounter (Signed)
I spoke with the patient. Her main concern is that she is waking up dried out because her humility level is too low. She said before when she was on a set pressure of 10 cmH20 this was not a problem. I told her she could adjust the humidity herself. She tried to adjust it while I was on the but could not figure it out. I told her she would need to contact [employer] for help. I asked if she wanted to still change her pressure settings and she said as long as the humidity level is increased the pressure should be fine. She will call back if she is still having problems.  Nothing further needed.

## 2022-08-09 NOTE — Telephone Encounter (Signed)
10 cmH2O was not controlling her well, which was why we switched her to the auto setting. It looks like she primarily uses 11.8 cmH2O so let's try switching her to set pressure of 12 cmH2O. Thanks!

## 2022-09-09 ENCOUNTER — Other Ambulatory Visit: Payer: Self-pay | Admitting: Psychiatry

## 2022-09-09 DIAGNOSIS — F4310 Post-traumatic stress disorder, unspecified: Secondary | ICD-10-CM

## 2022-10-10 ENCOUNTER — Other Ambulatory Visit: Payer: Self-pay | Admitting: Psychiatry

## 2022-10-10 DIAGNOSIS — F4310 Post-traumatic stress disorder, unspecified: Secondary | ICD-10-CM

## 2022-10-10 DIAGNOSIS — F410 Panic disorder [episodic paroxysmal anxiety] without agoraphobia: Secondary | ICD-10-CM

## 2022-11-13 ENCOUNTER — Other Ambulatory Visit
Admission: RE | Admit: 2022-11-13 | Discharge: 2022-11-13 | Disposition: A | Discharge status: Home / Self Care | Payer: 59 | Source: Ambulatory Visit | Attending: Psychiatry | Admitting: Psychiatry

## 2022-11-13 ENCOUNTER — Encounter: Payer: Self-pay | Admitting: Psychiatry

## 2022-11-13 ENCOUNTER — Ambulatory Visit (INDEPENDENT_AMBULATORY_CARE_PROVIDER_SITE_OTHER): Payer: 59 | Admitting: Psychiatry

## 2022-11-13 VITALS — BP 143/96 | HR 78 | Temp 97.2°F | Ht 66.25 in | Wt 215.6 lb

## 2022-11-13 DIAGNOSIS — Z79899 Other long term (current) drug therapy: Secondary | ICD-10-CM | POA: Insufficient documentation

## 2022-11-13 DIAGNOSIS — F424 Excoriation (skin-picking) disorder: Secondary | ICD-10-CM | POA: Diagnosis not present

## 2022-11-13 DIAGNOSIS — F3342 Major depressive disorder, recurrent, in full remission: Secondary | ICD-10-CM | POA: Diagnosis not present

## 2022-11-13 DIAGNOSIS — F4310 Post-traumatic stress disorder, unspecified: Secondary | ICD-10-CM

## 2022-11-13 DIAGNOSIS — F410 Panic disorder [episodic paroxysmal anxiety] without agoraphobia: Secondary | ICD-10-CM

## 2022-11-13 DIAGNOSIS — F4010 Social phobia, unspecified: Secondary | ICD-10-CM

## 2022-11-13 DIAGNOSIS — G4701 Insomnia due to medical condition: Secondary | ICD-10-CM

## 2022-11-13 MED ORDER — DULOXETINE HCL 30 MG PO CPEP: 30.0000 mg | ORAL_CAPSULE | Freq: Every day | ORAL | 1 refills | Status: DC

## 2022-11-13 MED ORDER — BUSPIRONE HCL 10 MG PO TABS: 20.0000 mg | ORAL_TABLET | Freq: Three times a day (TID) | ORAL | 1 refills | Status: DC

## 2022-11-13 NOTE — Progress Notes (Unsigned)
BH MD OP Progress Note  11/13/2022 2:35 PM Kelli Logan  MRN:  [id number]  Chief Complaint:  Chief Complaint  Patient presents with   Follow-up   Anxiety   Depression   Medication Refill   HPI: Kelli Logan is a 62 year old Caucasian female, divorced, lives in [place], has a history of PTSD, skin picking disorder, MDD, panic disorder, social anxiety disorder, insomnia, history of borderline personality disorder, obstructive sleep apnea on CPAP, fibromyalgia, total hip replacement per history, breast cancer per history was evaluated in office today.  Patient today reports she is currently struggling with a lot of situational stressors.  She reports she bought a new car and is currently stuck with the car payment which she cannot afford.  That has been making her anxious since she lives on a limited income.  Patient also reports her apartment is almost 63 years old and it is breaking apart.  She barely passed the inspection recently.  She reports she has has been feeling overwhelmed and has been having anxiety attacks during the day.  She does not know if she wants to go back to her therapist since she tried therapy in the past and that did not help.  The more she talks about her problems the more anxious she gets.  She also does not believe she can afford therapy financially at this time.  Patient reports sleep is good when she uses her CPAP.  She however continues to struggle with her CPAP mask.  She is planning to start using the older mask since she did not tolerate the newer one that they gave her.  Patient continues to have episodic skin picking however she believes it is related to her caffeine intake and not her anxiety.  Patient currently denies any suicidality, homicidality or perceptual disturbances.  She reports she is compliant on her medications like duloxetine, BuSpar and mirtazapine.  Patient reports the BuSpar and the hydroxyzine helps at night or she feels anxious  throughout the day although she is taking all these medications.  Agreeable to a trial of low-dose clonazepam.  Patient will need urine drug screen completed prior to that.  Patient currently reports she does have a friend who comes in and spends time with her.   Patient denies any other concerns today.  Visit Diagnosis:    ICD-10-CM   1. PTSD (post-traumatic stress disorder)  F43.10 DULoxetine (CYMBALTA) 30 MG capsule    busPIRone (BUSPAR) 10 MG tablet    2. Skin-picking disorder  F42.4 DULoxetine (CYMBALTA) 30 MG capsule    busPIRone (BUSPAR) 10 MG tablet    3. MDD (major depressive disorder), recurrent, in full remission (HCC)  F33.42 DULoxetine (CYMBALTA) 30 MG capsule    4. Panic disorder  F41.0 DULoxetine (CYMBALTA) 30 MG capsule    busPIRone (BUSPAR) 10 MG tablet    5. Social anxiety disorder  F40.10 DULoxetine (CYMBALTA) 30 MG capsule    busPIRone (BUSPAR) 10 MG tablet    6. Insomnia due to medical condition  G47.01    PTSD, OSA    7. High risk medication use  Z79.899 Urine drugs of abuse scrn w alc, routine (Ref Lab)      Past Psychiatric History: I have reviewed past psychiatric history from progress note on 03/10/2018.  Past Medical History:  Past Medical History:  Diagnosis Date   Anxiety    Arthritis    Cancer (HCC)    breast left   Depression    Fibromyalgia  GERD (gastroesophageal reflux disease)    Headache    History of kidney stones    h/o   Hypertension    Hypothyroidism    Sleep apnea    DOES NOT USE CPAP   Thyroid disease     Past Surgical History:  Procedure Laterality Date   APPENDECTOMY     BREAST BIOPSY Left few yrs ago   stereotactic bx in [place], benign   BREAST BIOPSY Left 12/26/2017   Affirm Bx [facility]- X-Clip   BREAST BIOPSY Left 01/14/2018   positive- pt had mastectomy   BREAST IMPLANT EXCHANGE Right 02/16/2021   Procedure: PLACEMENT OF BREAST IMPLANT;  Surgeon: Peggye Form, DO;  Location: [facility];  Service:  Plastics;  Laterality: Right;  1.5 hour   BREAST RECONSTRUCTION WITH PLACEMENT OF TISSUE EXPANDER AND ALLODERM Left 04/08/2018   Procedure: LEFT BREAST IMMEDIATE  RECONSTRUCTION WITH EXPANDER AND FLEX HD;  Surgeon: Peggye Form, DO;  Location: [facility];  Service: Plastics;  Laterality: Left;   COLONOSCOPY WITH PROPOFOL N/A 06/26/2018   Procedure: COLONOSCOPY WITH PROPOFOL;  Surgeon: Pasty Spillers, MD;  Location: [facility];  Service: Endoscopy;  Laterality: N/A;   DRUG INDUCED ENDOSCOPY Right 02/27/2022   Procedure: DRUG INDUCED ENDOSCOPY;  Surgeon: Christia Reading, MD;  Location: [facility];  Service: ENT;  Laterality: Right;   ESOPHAGOGASTRODUODENOSCOPY (EGD) WITH PROPOFOL N/A 06/26/2018   Procedure: ESOPHAGOGASTRODUODENOSCOPY (EGD) WITH PROPOFOL;  Surgeon: Pasty Spillers, MD;  Location: [facility];  Service: Endoscopy;  Laterality: N/A;   EYE SURGERY Bilateral    lasik   FOOT BONE EXCISION     IMAGE GUIDED SINUS SURGERY  10/220   LIPOSUCTION Bilateral 02/16/2021   Procedure: LIPOSUCTION OF BILATERAL BREAST;  Surgeon: Peggye Form, DO;  Location: [facility];  Service: Plastics;  Laterality: Bilateral;   LIPOSUCTION WITH LIPOFILLING Left 04/30/2019   Procedure: Fat grafting to left breast;  Surgeon: Peggye Form, DO;  Location:  [facility];  Service: Plastics;  Laterality: Left;  90 min, please   MASTECTOMY Left 04/08/2019   MASTECTOMY W/ SENTINEL NODE BIOPSY Left 04/08/2018   Procedure: MASTECTOMY WITH SENTINEL LYMPH NODE BIOPSY LEFT;  Surgeon: Duanne Guess, MD;  Location: [facility];  Service: General;  Laterality: Left;   MASTOPEXY Right 08/04/2018   Procedure: MASTOPEXY;  Surgeon: Peggye Form, DO;  Location: [facility];  Service: Plastics;  Laterality: Right;  TOTAL CASE TIME SHOULD BE 3 HOURS, PLEASE   REMOVAL OF TISSUE EXPANDER AND PLACEMENT OF IMPLANT Left 08/04/2018   Procedure: REMOVAL OF  TISSUE EXPANDER AND PLACEMENT OF IMPLANT;  Surgeon: Peggye Form, DO;  Location: [facility];  Service: Plastics;  Laterality: Left;   SCAR REVISION Left 04/30/2019   Procedure: release of scar contracture to left breast;  Surgeon: Peggye Form, DO;  Location: [facility];  Service: Plastics;  Laterality: Left;   TOTAL HIP ARTHROPLASTY Left 02/11/2020   Procedure: TOTAL HIP ARTHROPLASTY ANTERIOR APPROACH;  Surgeon: Kennedy Bucker, MD;  Location: [facility];  Service: Orthopedics;  Laterality: Left;   UMBILICAL HERNIA REPAIR  1983 and 1985    Family Psychiatric History: Reviewed family psychiatric history from progress note on 03/10/2018.  Family History:  Family History  Problem Relation Age of Onset   Hypertension Mother    Lung cancer Maternal Grandmother    Heart disease Father    Alcohol abuse Father    Breast cancer Neg Hx  Colon cancer Neg Hx     Social History: Reviewed social history from progress note on 03/10/2018 Social History   Socioeconomic History   Marital status: Divorced    Spouse name: Not on file   Number of children: 1   Years of education: Not on file   Highest education level: High school graduate  Occupational History   Not on file  Tobacco Use   Smoking status: Former    Current packs/day: 0.00    Average packs/day: 1 pack/day for 2.0 years (2.0 ttl pk-yrs)    Types: Cigarettes    Start date: 01/07/2015    Quit date: 01/06/2017    Years since quitting: 5.8   Smokeless tobacco: Never  Vaping Use   Vaping status: Former   Quit date: 11/27/2016  Substance and Sexual Activity   Alcohol use: Yes    Comment: rare beer    Drug use: Not Currently   Sexual activity: Not on file  Other Topics Concern   Not on file  Social History Narrative   Home on disability [pysche problems]; transportation issues; worked in Chief Financial Officer. From [place]; moved after separation. Quit smoking; ocassional alcohol.    Social Determinants of  Health   Financial Resource Strain: High Risk (12/31/2019)   Overall Financial Resource Strain (CARDIA)    Difficulty of Paying Living Expenses: Very hard  Food Insecurity: Food Insecurity Present (12/31/2019)   Hunger Vital Sign    Worried About Running Out of Food in the Last Year: Often true    Ran Out of Food in the Last Year: Often true  Transportation Needs: Unmet Transportation Needs (03/10/2018)   PRAPARE - Administrator, Civil Service (Medical): Yes    Lack of Transportation (Non-Medical): Yes  Physical Activity: Inactive (03/10/2018)   Exercise Vital Sign    Days of Exercise per Week: 0 days    Minutes of Exercise per Session: 0 min  Stress: Stress Concern Present (12/31/2019)   [employer] of Occupational Health - Occupational Stress Questionnaire    Feeling of Stress : Very much  Social Connections: Unknown (03/10/2018)   Social Connection and Isolation Panel [NHANES]    Frequency of Communication with Friends and Family: Not on file    Frequency of Social Gatherings with Friends and Family: Not on file    Attends Religious Services: Never    Database administrator or Organizations: No    Attends Banker Meetings: Never    Marital Status: Divorced    Allergies:  Allergies  Allergen Reactions   Tape Other (See Comments)    Steri strips after breast surgery/Burned skin   Tegaderm and other tape OK    Metabolic Disorder Labs: No results found for: "HGBA1C", "MPG" No results found for: "PROLACTIN" No results found for: "CHOL", "TRIG", "HDL", "CHOLHDL", "VLDL", "LDLCALC" No results found for: "TSH"  Therapeutic Level Labs: No results found for: "LITHIUM" No results found for: "VALPROATE" No results found for: "CBMZ"  Current Medications: Current Outpatient Medications  Medication Sig Dispense Refill   albuterol (VENTOLIN HFA) 108 (90 Base) MCG/ACT inhaler Inhale 2 puffs into the lungs every 6 (six) hours as needed for wheezing or  shortness of breath. 8 g 6   atorvastatin (LIPITOR) 20 MG tablet Take 20 mg by mouth at bedtime.     Biotin (SUPER BIOTIN) 5 MG TABS Take by mouth.     Coenzyme Q10 (COQ10) 100 MG CAPS Take 100 mg by mouth every evening.  hydrochlorothiazide (HYDRODIURIL) 25 MG tablet Take 25 mg by mouth daily.      hydrOXYzine (VISTARIL) 50 MG capsule TAKE 1 CAPSULE (50 MG TOTAL) BY MOUTH DAILY AS NEEDED. FOR SLEEP, ANXIETY 90 capsule 1   levothyroxine (SYNTHROID) 75 MCG tablet Take 75 mcg by mouth every other day. Alternating with 50 mcg every other day-AM     levothyroxine (SYNTHROID, LEVOTHROID) 50 MCG tablet Take 50 mcg by mouth every other day. Alternating with 75 mcg every other day-AM     losartan (COZAAR) 100 MG tablet Take 100 mg by mouth daily. for high blood pressure     methocarbamol (ROBAXIN) 500 MG tablet Take 250-500 mg by mouth at bedtime as needed.     metoprolol (LOPRESSOR) 50 MG tablet Take 1 tablet (50 mg total) by mouth 2 (two) times daily. (Patient taking differently: Take 50 mg by mouth daily.) 60 tablet 0   mirtazapine (REMERON) 15 MG tablet TAKE 1 TABLET BY MOUTH EVERYDAY AT BEDTIME 90 tablet 1   Multiple Vitamin (MULTIVITAMIN WITH MINERALS) TABS tablet Take 1 tablet by mouth daily.     NONFORMULARY OR COMPOUNDED ITEM 1 Device by Other route as directed. CPAP-with the pressure setting of 10 cm H20 with heated humidity and ResMed AirFit F20 Full Face Mask , size small. 1 each 0   Omega-3 Fatty Acids (FISH OIL PO) Take 1 capsule by mouth daily.     omeprazole (PRILOSEC) 20 MG capsule Take 20 mg by mouth every morning.     busPIRone (BUSPAR) 10 MG tablet Take 2 tablets (20 mg total) by mouth 3 (three) times daily. 540 tablet 1   DULoxetine (CYMBALTA) 30 MG capsule Take 1 capsule (30 mg total) by mouth daily. 90 capsule 1   No current facility-administered medications for this visit.     Musculoskeletal: Strength & Muscle Tone: within normal limits Gait & Station: normal Patient  leans: N/A  Psychiatric Specialty Exam: Review of Systems  Psychiatric/Behavioral:  The patient is nervous/anxious.     Blood pressure (!) 143/96, pulse 78, temperature (!) 97.2 F (36.2 C), temperature source Skin, height 5' 6.25" (1.683 m), weight 221 lb 12.8 oz (100.6 kg), last menstrual period 02/05/2016.Body mass index is 35.53 kg/m.  General Appearance: Fairly Groomed  Eye Contact:  Good  Speech:  Clear and Coherent  Volume:  Normal  Mood:  Anxious  Affect:  Congruent  Thought Process:  Goal Directed and Descriptions of Associations: Intact  Orientation:  Full (Time, Place, and Person)  Thought Content: Logical   Suicidal Thoughts:  No  Homicidal Thoughts:  No  Memory:  Immediate;   Fair Recent;   Fair Remote;   Fair  Judgement:  Fair  Insight:  Fair  Psychomotor Activity:  Normal  Concentration:  Concentration: Fair and Attention Span: Fair  Recall:  [employer] of Knowledge: Fair  Language: Fair  Akathisia:  No  Handed:  Right  AIMS (if indicated): done  Assets:  Desire for Improvement Housing Social Support Transportation  ADL's:  Intact  Cognition: WNL  Sleep:  Fair   Screenings: AIMS    Flowsheet Row Video Visit from 06/22/2021 in [facility] Video Visit from 05/22/2021 in [facility]  AIMS Total Score 0 0      GAD-7    Flowsheet Row Office Visit from 11/13/2022 in [facility] Office Visit from 01/17/2022 in [facility] Video Visit  from 11/16/2021 in [facility] Video Visit from 09/28/2021 in [facility] Video Visit from 07/26/2021 in [facility]  Total GAD-7 Score 19 14 9 14 4       PHQ2-9    Flowsheet Row Office Visit from 11/13/2022 in [facility] Office Visit from 01/17/2022 in [facility] Video Visit from 09/28/2021 in [facility] Video Visit from 07/26/2021 in [facility] Video Visit from 06/22/2021 in [facility]  PHQ-2 Total Score 1 1 1  0 0  PHQ-9 Total Score -- 5 -- -- --      Flowsheet Row Office Visit from 11/13/2022 in [facility] Video Visit from 07/16/2022 in [facility] Video Visit from 04/16/2022 in [facility]  C-SSRS RISK CATEGORY Moderate Risk No Risk No Risk        Assessment and Plan: ***  Collaboration of Care: Collaboration of Care: [facility] OP Collaboration of Care:[id number]}  Patient/Guardian was advised Release of Information must be obtained prior to any record release in order to collaborate their care with an outside provider. Patient/Guardian was advised if they have not already done so to contact the registration department to sign all necessary forms in order for [place] to release information regarding their care.   Consent: Patient/Guardian gives verbal consent for treatment and assignment of benefits for services provided during this visit. Patient/Guardian expressed understanding and agreed to proceed.    Jomarie Longs, MD 11/13/2022, 2:35 PM

## 2022-11-13 NOTE — Patient Instructions (Addendum)
[facility]  [address] [place], [place] [address] [phone]    **Apps:**   - [employer]   - [facility]

## 2022-11-14 LAB — URINE DRUGS OF ABUSE SCREEN W ALC, ROUTINE (REF LAB)
Amphetamines, Urine: NEGATIVE ng/mL
Barbiturate, Ur: NEGATIVE ng/mL
Benzodiazepine Quant, Ur: NEGATIVE ng/mL
Cannabinoid Quant, Ur: NEGATIVE ng/mL
Cocaine (Metab.): NEGATIVE ng/mL
Ethanol U, Quan: NEGATIVE %
Methadone Screen, Urine: NEGATIVE ng/mL
Opiate Quant, Ur: NEGATIVE ng/mL
Phencyclidine, Ur: NEGATIVE ng/mL
Propoxyphene, Urine: NEGATIVE ng/mL

## 2022-11-16 ENCOUNTER — Telehealth: Payer: Self-pay | Admitting: Psychiatry

## 2022-11-16 DIAGNOSIS — F4310 Post-traumatic stress disorder, unspecified: Secondary | ICD-10-CM

## 2022-11-16 DIAGNOSIS — F410 Panic disorder [episodic paroxysmal anxiety] without agoraphobia: Secondary | ICD-10-CM

## 2022-11-16 MED ORDER — CLONAZEPAM 0.5 MG PO TABS: 0.2500 mg | ORAL_TABLET | Freq: Every day | ORAL | 1 refills | Status: DC | PRN

## 2022-11-16 NOTE — Telephone Encounter (Signed)
I have reviewed urine drug screen resulted-11/16/2022.  Negative.  Have sent clonazepam low dosage to pharmacy as requested for severe anxiety symptoms.

## 2022-11-19 NOTE — Telephone Encounter (Signed)
left message that test was negative and that rx has been sent into the pharmacy

## 2022-11-30 NOTE — Telephone Encounter (Signed)
Error

## 2022-12-22 ENCOUNTER — Encounter (INDEPENDENT_AMBULATORY_CARE_PROVIDER_SITE_OTHER): Payer: Self-pay

## 2022-12-24 ENCOUNTER — Other Ambulatory Visit: Payer: Self-pay | Admitting: Medical Genetics

## 2023-01-07 ENCOUNTER — Telehealth: Payer: 59 | Admitting: Psychiatry

## 2023-01-07 ENCOUNTER — Encounter: Payer: Self-pay | Admitting: Psychiatry

## 2023-01-07 DIAGNOSIS — F424 Excoriation (skin-picking) disorder: Secondary | ICD-10-CM

## 2023-01-07 DIAGNOSIS — Z9189 Other specified personal risk factors, not elsewhere classified: Secondary | ICD-10-CM

## 2023-01-07 DIAGNOSIS — F4310 Post-traumatic stress disorder, unspecified: Secondary | ICD-10-CM | POA: Diagnosis not present

## 2023-01-07 DIAGNOSIS — F331 Major depressive disorder, recurrent, moderate: Secondary | ICD-10-CM | POA: Diagnosis not present

## 2023-01-07 DIAGNOSIS — F4010 Social phobia, unspecified: Secondary | ICD-10-CM

## 2023-01-07 DIAGNOSIS — G4701 Insomnia due to medical condition: Secondary | ICD-10-CM

## 2023-01-07 DIAGNOSIS — F410 Panic disorder [episodic paroxysmal anxiety] without agoraphobia: Secondary | ICD-10-CM

## 2023-01-07 MED ORDER — CLONAZEPAM 0.5 MG PO TABS: 0.2500 mg | ORAL_TABLET | Freq: Every day | ORAL | 1 refills | Status: DC | PRN

## 2023-01-07 MED ORDER — BREXPIPRAZOLE 0.25 MG PO TABS: 0.2500 mg | ORAL_TABLET | Freq: Every day | ORAL | 1 refills | Status: DC

## 2023-01-07 NOTE — Progress Notes (Signed)
Virtual Visit via Video Note  I connected with Kelli Logan on 01/07/23 at  3:30 PM EST by a video enabled telemedicine application and verified that I am speaking with the correct person using two identifiers.  Location Provider Location : [facility] Patient Location : Home  Participants: Patient , Provider    I discussed the limitations of evaluation and management by telemedicine and the availability of in person appointments. The patient expressed understanding and agreed to proceed.   I discussed the assessment and treatment plan with the patient. The patient was provided an opportunity to ask questions and all were answered. The patient agreed with the plan and demonstrated an understanding of the instructions.   The patient was advised to call back or seek an in-person evaluation if the symptoms worsen or if the condition fails to improve as anticipated.  BH MD OP Progress Note  01/08/2023 12:51 PM Keagan Trezise Minotti  MRN:  [id number]  Chief Complaint:  Chief Complaint  Patient presents with   Follow-up   Depression   Anxiety   Medication Refill   HPI: Kelli Logan is a 62 year old Caucasian female, divorced, lives in [place], has a history of PTSD, skin picking disorder, MDD, panic disorder, social anxiety disorder, insomnia, history of borderline personality disorder, obstructive sleep apnea on CPAP, fibromyalgia, total hip replacement per history, breast cancer per history was evaluated by telemedicine today.  The patient presents with escalating stress levels due to financial difficulties. She reports a recent acquisition of a car, which has led to significant financial strain and defaulting on payments. This stress is exacerbating her chronic pain, particularly in the neck and lower back, which she attributes to pinched nerves. The pain is unpredictable, sometimes triggered by minor tasks such as stooping to pick up a paper, and other times unaffected by lifting heavy  objects.  The patient also reports symptoms of numbness in the middle of her back and arms, which she believes is related to her neck pain. She has a history of arthritis, which is currently causing discomfort in her knuckles due to the cold, damp weather.  The patient's stress and chronic pain are impacting her ability to perform daily tasks, such as laundry and cooking. She reports that she is often unable to stand for long periods, leading to a reliance on microwave meals. She also mentions a habit of purchasing items from [employer] as a coping mechanism, which further contributes to her financial stress.  The patient has a history of treatment for her conditions, including visits to a rheumatologist, neurosurgeon, neurologist, and psychiatrist. She has previously been prescribed methotrexate and Lyrica, but discontinued methotrexate due to hair loss. She is currently taking clonazepam sparingly, which she reports is helping with her leg symptoms as well.  The patient also has a CPAP machine for sleep, which she is unsure how to clean properly. She reports sleep as fair however has started dreaming again.   The patient's financial stress is also impacting her personal relationships, leading to a decision to take a break from her boyfriend. She expresses a desire for stability and normalcy in her life.  Patient agreeable to trial of Rexulti to address her current mood symptoms including sadness, anxiety, racing thoughts.  Patient denies any suicidality, homicidality or perceptual disturbances.  Visit Diagnosis:    ICD-10-CM   1. PTSD (post-traumatic stress disorder)  F43.10 brexpiprazole (REXULTI) 0.25 MG TABS tablet    clonazePAM (KLONOPIN) 0.5 MG tablet    2. Skin-picking disorder  F42.4     3. MDD (major depressive disorder), recurrent episode, moderate (HCC)  F33.1 brexpiprazole (REXULTI) 0.25 MG TABS tablet    4. Panic disorder  F41.0 clonazePAM (KLONOPIN) 0.5 MG tablet    5. Social  anxiety disorder  F40.10     6. Insomnia due to medical condition  G47.01    PTSD, OSA    7. At risk for prolonged QT interval syndrome  Z91.89 EKG 12-Lead      Past Psychiatric History: I have reviewed past psychiatric history from progress note on 03/10/2018.  Past Medical History:  Past Medical History:  Diagnosis Date   Anxiety    Arthritis    Cancer (HCC)    breast left   Depression    Fibromyalgia    GERD (gastroesophageal reflux disease)    Headache    History of kidney stones    h/o   Hypertension    Hypothyroidism    Sleep apnea    DOES NOT USE CPAP   Thyroid disease     Past Surgical History:  Procedure Laterality Date   APPENDECTOMY     BREAST BIOPSY Left few yrs ago   stereotactic bx in [place], benign   BREAST BIOPSY Left 12/26/2017   Affirm Bx [facility]- X-Clip   BREAST BIOPSY Left 01/14/2018   positive- pt had mastectomy   BREAST IMPLANT EXCHANGE Right 02/16/2021   Procedure: PLACEMENT OF BREAST IMPLANT;  Surgeon: Peggye Form, DO;  Location: [facility];  Service: Plastics;  Laterality: Right;  1.5 hour   BREAST RECONSTRUCTION WITH PLACEMENT OF TISSUE EXPANDER AND ALLODERM Left 04/08/2018   Procedure: LEFT BREAST IMMEDIATE  RECONSTRUCTION WITH EXPANDER AND FLEX HD;  Surgeon: Peggye Form, DO;  Location: [facility];  Service: Plastics;  Laterality: Left;   COLONOSCOPY WITH PROPOFOL N/A 06/26/2018   Procedure: COLONOSCOPY WITH PROPOFOL;  Surgeon: Pasty Spillers, MD;  Location: [facility];  Service: Endoscopy;  Laterality: N/A;   DRUG INDUCED ENDOSCOPY Right 02/27/2022   Procedure: DRUG INDUCED ENDOSCOPY;  Surgeon: Christia Reading, MD;  Location: [facility];  Service: ENT;  Laterality: Right;   ESOPHAGOGASTRODUODENOSCOPY (EGD) WITH PROPOFOL N/A 06/26/2018   Procedure: ESOPHAGOGASTRODUODENOSCOPY (EGD) WITH PROPOFOL;  Surgeon: Pasty Spillers, MD;  Location: [facility];  Service: Endoscopy;  Laterality: N/A;    EYE SURGERY Bilateral    lasik   FOOT BONE EXCISION     IMAGE GUIDED SINUS SURGERY  10/220   LIPOSUCTION Bilateral 02/16/2021   Procedure: LIPOSUCTION OF BILATERAL BREAST;  Surgeon: Peggye Form, DO;  Location: [facility];  Service: Plastics;  Laterality: Bilateral;   LIPOSUCTION WITH LIPOFILLING Left 04/30/2019   Procedure: Fat grafting to left breast;  Surgeon: Peggye Form, DO;  Location: [facility];  Service: Plastics;  Laterality: Left;  90 min, please   MASTECTOMY Left 04/08/2019   MASTECTOMY W/ SENTINEL NODE BIOPSY Left 04/08/2018   Procedure: MASTECTOMY WITH SENTINEL LYMPH NODE BIOPSY LEFT;  Surgeon: Duanne Guess, MD;  Location: [facility];  Service: General;  Laterality: Left;   MASTOPEXY Right 08/04/2018   Procedure: MASTOPEXY;  Surgeon: Peggye Form, DO;  Location: [facility];  Service: Plastics;  Laterality: Right;  TOTAL CASE TIME SHOULD BE 3 HOURS, PLEASE   REMOVAL OF TISSUE EXPANDER AND PLACEMENT OF IMPLANT Left 08/04/2018   Procedure: REMOVAL OF TISSUE EXPANDER AND PLACEMENT OF IMPLANT;  Surgeon: Peggye Form, DO;  Location: [facility];  Service: Plastics;  Laterality: Left;   SCAR REVISION Left 04/30/2019   Procedure: release of scar contracture to left breast;  Surgeon: Peggye Form, DO;  Location: [facility];  Service: Plastics;  Laterality: Left;   TOTAL HIP ARTHROPLASTY Left 02/11/2020   Procedure: TOTAL HIP ARTHROPLASTY ANTERIOR APPROACH;  Surgeon: Kennedy Bucker, MD;  Location: [facility];  Service: Orthopedics;  Laterality: Left;   UMBILICAL HERNIA REPAIR  1983 and 1985    Family Psychiatric History: I have reviewed family psychiatric history from progress note on 03/10/2018.  Family History:  Family History  Problem Relation Age of Onset   Hypertension Mother    Lung cancer Maternal Grandmother    Heart disease Father    Alcohol abuse Father    Breast cancer Neg Hx    Colon cancer  Neg Hx     Social History: I have reviewed social history from progress note on 03/10/2018. Social History   Socioeconomic History   Marital status: Divorced    Spouse name: Not on file   Number of children: 1   Years of education: Not on file   Highest education level: High school graduate  Occupational History   Not on file  Tobacco Use   Smoking status: Former    Current packs/day: 0.00    Average packs/day: 1 pack/day for 2.0 years (2.0 ttl pk-yrs)    Types: Cigarettes    Start date: 01/07/2015    Quit date: 01/06/2017    Years since quitting: 6.0   Smokeless tobacco: Never  Vaping Use   Vaping status: Former   Quit date: 11/27/2016  Substance and Sexual Activity   Alcohol use: Yes    Comment: rare beer    Drug use: Not Currently   Sexual activity: Not on file  Other Topics Concern   Not on file  Social History Narrative   Home on disability [pysche problems]; transportation issues; worked in Chief Financial Officer. From [place]; moved after separation. Quit smoking; ocassional alcohol.    Social Drivers of Health   Financial Resource Strain: High Risk (12/31/2019)   Overall Financial Resource Strain (CARDIA)    Difficulty of Paying Living Expenses: Very hard  Food Insecurity: Food Insecurity Present (12/31/2019)   Hunger Vital Sign    Worried About Running Out of Food in the Last Year: Often true    Ran Out of Food in the Last Year: Often true  Transportation Needs: Unmet Transportation Needs (03/10/2018)   PRAPARE - Administrator, Civil Service (Medical): Yes    Lack of Transportation (Non-Medical): Yes  Physical Activity: Inactive (03/10/2018)   Exercise Vital Sign    Days of Exercise per Week: 0 days    Minutes of Exercise per Session: 0 min  Stress: Stress Concern Present (12/31/2019)   [employer] of Occupational Health - Occupational Stress Questionnaire    Feeling of Stress : Very much  Social Connections: Unknown (03/10/2018)   Social Connection  and Isolation Panel [NHANES]    Frequency of Communication with Friends and Family: Not on file    Frequency of Social Gatherings with Friends and Family: Not on file    Attends Religious Services: Never    Database administrator or Organizations: No    Attends Banker Meetings: Never    Marital Status: Divorced    Allergies:  Allergies  Allergen Reactions   Tape Other (See Comments)    Steri strips after breast surgery/Burned skin   Tegaderm and other tape  OK    Metabolic Disorder Labs: No results found for: "HGBA1C", "MPG" No results found for: "PROLACTIN" No results found for: "CHOL", "TRIG", "HDL", "CHOLHDL", "VLDL", "LDLCALC" No results found for: "TSH"  Therapeutic Level Labs: No results found for: "LITHIUM" No results found for: "VALPROATE" No results found for: "CBMZ"  Current Medications: Current Outpatient Medications  Medication Sig Dispense Refill   brexpiprazole (REXULTI) 0.25 MG TABS tablet Take 1 tablet (0.25 mg total) by mouth daily. 30 tablet 1   clindamycin (CLINDAGEL) 1 % gel SMARTSIG:1 sparingly Topical Twice Daily     hydrocortisone cream 1 % Apply 1 a small amount to skin twice a day for 14 days     Hydrocortisone, Perianal, 1 % CREA Apply topically.     albuterol (VENTOLIN HFA) 108 (90 Base) MCG/ACT inhaler Inhale 2 puffs into the lungs every 6 (six) hours as needed for wheezing or shortness of breath. 8 g 6   atorvastatin (LIPITOR) 20 MG tablet Take 20 mg by mouth at bedtime.     Biotin (SUPER BIOTIN) 5 MG TABS Take by mouth.     busPIRone (BUSPAR) 10 MG tablet Take 2 tablets (20 mg total) by mouth 3 (three) times daily. 540 tablet 1   [START ON 01/15/2023] clonazePAM (KLONOPIN) 0.5 MG tablet Take 0.5-1 tablets (0.25-0.5 mg total) by mouth daily as needed for anxiety. Please limit use and use only for severe anxiety 30 tablet 1   Coenzyme Q10 (COQ10) 100 MG CAPS Take 100 mg by mouth every evening.     DULoxetine (CYMBALTA) 30 MG capsule Take  1 capsule (30 mg total) by mouth daily. 90 capsule 1   hydrochlorothiazide (HYDRODIURIL) 25 MG tablet Take 25 mg by mouth daily.      hydrOXYzine (VISTARIL) 50 MG capsule TAKE 1 CAPSULE (50 MG TOTAL) BY MOUTH DAILY AS NEEDED. FOR SLEEP, ANXIETY 90 capsule 1   levothyroxine (SYNTHROID) 75 MCG tablet Take 75 mcg by mouth every other day. Alternating with 50 mcg every other day-AM     levothyroxine (SYNTHROID, LEVOTHROID) 50 MCG tablet Take 50 mcg by mouth every other day. Alternating with 75 mcg every other day-AM     losartan (COZAAR) 100 MG tablet Take 100 mg by mouth daily. for high blood pressure     methocarbamol (ROBAXIN) 500 MG tablet Take 250-500 mg by mouth at bedtime as needed.     metoprolol (LOPRESSOR) 50 MG tablet Take 1 tablet (50 mg total) by mouth 2 (two) times daily. (Patient taking differently: Take 50 mg by mouth daily.) 60 tablet 0   mirtazapine (REMERON) 15 MG tablet TAKE 1 TABLET BY MOUTH EVERYDAY AT BEDTIME 90 tablet 1   Multiple Vitamin (MULTIVITAMIN WITH MINERALS) TABS tablet Take 1 tablet by mouth daily.     NONFORMULARY OR COMPOUNDED ITEM 1 Device by Other route as directed. CPAP-with the pressure setting of 10 cm H20 with heated humidity and ResMed AirFit F20 Full Face Mask , size small. 1 each 0   Omega-3 Fatty Acids (FISH OIL PO) Take 1 capsule by mouth daily.     omeprazole (PRILOSEC) 20 MG capsule Take 20 mg by mouth every morning.     No current facility-administered medications for this visit.     Musculoskeletal: Strength & Muscle Tone:  UTA Gait & Station:  Seated Patient leans: N/A  Psychiatric Specialty Exam: Review of Systems  Psychiatric/Behavioral:  Positive for dysphoric mood. The patient is nervous/anxious.     Last menstrual period 02/05/2016.There is no  height or weight on file to calculate BMI.  General Appearance: Fairly Groomed  Eye Contact:  Good  Speech:  Clear and Coherent  Volume:  Normal  Mood:  Anxious and Depressed  Affect:   Congruent  Thought Process:  Goal Directed and Descriptions of Associations: Intact  Orientation:  Full (Time, Place, and Person)  Thought Content: Logical   Suicidal Thoughts:  No  Homicidal Thoughts:  No  Memory:  Immediate;   Fair Recent;   Fair Remote;   Fair  Judgement:  Fair  Insight:  Fair  Psychomotor Activity:  Normal  Concentration:  Concentration: Fair and Attention Span: Fair  Recall:  [employer] of Knowledge: Fair  Language: Fair  Akathisia:  No  Handed:  Right  AIMS (if indicated): not done  Assets:  Desire for Improvement Housing Social Support  ADL's:  Intact  Cognition: WNL  Sleep:   Has weird dreams    Screenings: AIMS    Flowsheet Row Video Visit from 06/22/2021 in [facility] Video Visit from 05/22/2021 in [facility]  AIMS Total Score 0 0      GAD-7    Flowsheet Row Office Visit from 11/13/2022 in [facility] Office Visit from 01/17/2022 in [facility] Video Visit from 11/16/2021 in [facility] Video Visit from 09/28/2021 in [facility] Video Visit from 07/26/2021 in [facility]  Total GAD-7 Score 19 14 9 14 4       PHQ2-9    Flowsheet Row Office Visit from 11/13/2022 in [facility] Office Visit from 01/17/2022 in [facility] Video Visit from 09/28/2021 in [facility] Video Visit from 07/26/2021 in [facility] Video Visit from 06/22/2021 in [facility]  PHQ-2 Total Score 1 1 1  0 0  PHQ-9 Total Score -- 5 -- -- --      Flowsheet Row Video Visit from 01/07/2023 in [facility] Office Visit from 11/13/2022 in [facility] Video Visit from 07/16/2022 in [facility]  C-SSRS RISK CATEGORY Moderate Risk Moderate Risk No Risk        Assessment and Plan: Luan Jagow is a 62 year old Caucasian female, divorced, has a history of multiple medical problems, chronic pain, current situational stressors, including financial stressors which does have an impact on her mood symptoms, discussed assessment and plan as noted below.  Depression-unstable Significant stress and financial difficulties contributing to depression. Feels hopeless and fearful about the future. No suicidal ideation or auditory hallucinations. Discussed benefits and risks of starting Rexulti, including tardive dyskinesia, abnormal involuntary movements, weight gain, and sedation. Informed about the need for an EKG after starting medication. Prefers trying a new medication. - Prescribe Rexulti 0.25 mg - Order EKG after starting Rexulti - Review recent lab results (cholesterol, hemoglobin A1c) patient agrees to sign a release and fax over recent labs.  PTSD-improving  Currently reports sleep has improved although dreams have returned likely due to recent stressors.  Patient currently not established with therapist although this was discussed previously. - Continue Cymbalta 30 mg p.o. daily - Continue BuSpar 20 mg 3 times daily - Continue mirtazapine 15 mg p.o. nightly - Encouraged to establish care with therapist  although patient noncompliant.  Skin picking disorder/social anxiety disorder-improving Currently improving on the current medication regimen. - Clonazepam 0.5 mg daily as needed.  Patient to limit use. -  Reviewed [employer] PMP AWARxE  Insomnia/obstructive sleep apnea on CPAP-improving Patient encouraged to continue CPAP, recent recurrence of dreams, patient to monitor this. -  Continue hydroxyzine 50 mg at bedtime - Mirtazapine 15 mg at bedtime  At risk for prolonged QT syndrome-patient to contact [phone] to get EKG completed since starting Rexulti.   Follow-up - Schedule follow-up video visit on February 3rd at 4:30 PM.   Collaboration of Care: Collaboration of Care: Referral or follow-up with counselor/therapist AEB patient encouraged to establish care with therapist, resources provider.  Patient/Guardian was advised Release of Information must be obtained prior to any record release in order to collaborate their care with an outside provider. Patient/Guardian was advised if they have not already done so to contact the registration department to sign all necessary forms in order for [place] to release information regarding their care.   Consent: Patient/Guardian gives verbal consent for treatment and assignment of benefits for services provided during this visit. Patient/Guardian expressed understanding and agreed to proceed.   This note was generated in part or whole with voice recognition software. Voice recognition is usually quite accurate but there are transcription errors that can and very often do occur. I apologize for any typographical errors that were not detected and corrected.    Jomarie Longs, MD 01/08/2023, 12:51 PM

## 2023-01-07 NOTE — Patient Instructions (Signed)
Brexpiprazole Tablets What is this medication? BREXPIPRAZOLE (brex PIP ray zole) treats schizophrenia. It may also be used with antidepressant medication to treat depression. It can also be used to treat agitation caused by Alzheimer disease. It works by balancing the levels of dopamine and serotonin in your brain, substances that help regulate mood, behaviors, and thoughts. It belongs to a group of medications called antipsychotics. Antipsychotic medications can be used to treat several kinds of mental health conditions. This medicine may be used for other purposes; ask your health care provider or pharmacist if you have questions. COMMON BRAND NAME(S): REXULTI What should I tell my care team before I take this medication? They need to know if you have any of these conditions: Dementia Diabetes Difficulty swallowing Have trouble controlling your muscles Have urges you are unable to control (for example, gambling, spending money, or eating) Heart disease High cholesterol History of breast cancer History of stroke Kidney disease Liver disease Low blood counts, like low white cell, platelet, or red cell counts Low blood pressure Parkinson's disease Seizures Suicidal thoughts, plans or attempt; a previous suicide attempt by you or a family member An unusual or allergic reaction to brexpiprazole, other medications, foods, dyes, or preservatives Pregnant or trying to get pregnant Breast-feeding How should I use this medication? Take this medication by mouth with water. Take it as directed on the prescription label at the same time every day. You can take it with or without food. If it upsets your stomach, take it with food. Keep taking this medication unless your care team tells you to stop. Stopping it too quickly can cause serious side effects. It can also make your condition worse. A special MedGuide will be given to you by the pharmacist with each prescription and refill. Be sure to read  this information carefully each time. Talk to your care team about the use of this medication in children. While it may be prescribed for children as young as 13 years for selected conditions, precautions do apply. Overdosage: If you think you have taken too much of this medicine contact a poison control center or emergency room at once. NOTE: This medicine is only for you. Do not share this medicine with others. What if I miss a dose? If you miss a dose, take it as soon as you can. If it is almost time for your next dose, take only that dose. Do not take double or extra doses. What may interact with this medication? Do not take this medication with any of the following: Aripiprazole Metoclopramide This medication may also interact with the following: Antihistamines for allergy, cough, and cold Certain medications for anxiety or sleep Certain medications for depression like amitriptyline, duloxetine, fluoxetine, paroxetine, sertraline Certain medications for fungal infections like fluconazole, itraconazole, ketoconazole Clarithromycin General anesthetics like halothane, isoflurane, methoxyflurane, propofol Levodopa or other medications for Parkinson's disease Medications for blood pressure Medications that relax muscles for surgery Medications for seizures Narcotic medications for pain Phenothiazines like chlorpromazine, prochlorperazine, thioridazine Quinidine Rifampin St. John's Wort This list may not describe all possible interactions. Give your health care provider a list of all the medicines, herbs, non-prescription drugs, or dietary supplements you use. Also tell them if you smoke, drink alcohol, or use illegal drugs. Some items may interact with your medicine. What should I watch for while using this medication? Visit your care team for regular checks on your progress. Tell your care team if symptoms do not start to get better or if they get worse.  Do not stop taking except on your  care team's advice. You may develop a severe reaction. Your care team will tell you how much medication to take. Patients and their families should watch out for new or worsening depression or thoughts of suicide. Also watch out for sudden changes in feelings such as feeling anxious, agitated, panicky, irritable, hostile, aggressive, impulsive, severely restless, overly excited and hyperactive, or not being able to sleep. If this happens, especially at the beginning of antidepressant treatment or after a change in dose, call your care team. You may get dizzy or drowsy. Do not drive, use machinery, or do anything that needs mental alertness until you know how this medication affects you. Do not stand or sit up quickly, especially if you are an older patient. This reduces the risk of dizzy or fainting spells. Alcohol may interfere with the effect of this medication. Avoid alcoholic drinks. There have been reports of increased sexual urges or other strong urges such as gambling while taking this medication. If you experience any of these while taking this medication, you should report this to your care team as soon as possible. This medication may cause dry eyes and blurred vision. If you wear contact lenses you may feel some discomfort. Lubricating drops may help. See your eye care team if the problem does not go away or is severe. This medication may increase blood sugar. Ask your care team if changes in diet or medications are needed if you have diabetes. This medication can cause problems with controlling your body temperature. It can lower the response of your body to cold temperatures. If possible, stay indoors during cold weather. If you must go outdoors, wear warm clothes. It can also lower the response of your body to heat. Do not overheat. Do not over-exercise. Stay out of the sun when possible. If you must be in the sun, wear cool clothing. Drink plenty of water. If you have trouble controlling your  body temperature, call your care team right away. What side effects may I notice from receiving this medication? Side effects that you should report to your care team as soon as possible: Allergic reactions--skin rash, itching, hives, swelling of the face, lips, tongue, or throat High blood sugar (hyperglycemia)--increased thirst or amount of urine, unusual weakness or fatigue, blurry vision High fever, stiff muscles, increased sweating, fast or irregular heartbeat, and confusion, which may be signs of neuroleptic malignant syndrome Infection--fever, chills, cough, sore throat Low blood pressure--dizziness, feeling faint or lightheaded, blurry vision Pain or trouble swallowing Seizures Stroke--sudden numbness or weakness of the face, arm, or leg, trouble speaking, confusion, trouble walking, loss of balance or coordination, dizziness, severe headache, change in vision Thoughts of suicide or self-harm, worsening mood, feelings of depression Uncontrolled and repetitive body movements, muscle stiffness or spasms, tremors or shaking, loss of balance or coordination, restlessness, shuffling walk, which may be signs of extrapyramidal symptoms (EPS) Urges to engage in impulsive behaviors such as gambling, binge eating, sexual activity, or shopping in ways that are unusual for you Side effects that usually do not require medical attention (report to your care team if they continue or are bothersome): Constipation Drowsiness Headache Weight gain This list may not describe all possible side effects. Call your doctor for medical advice about side effects. You may report side effects to FDA at 1-800-FDA-1088. Where should I keep my medication? Keep out of the reach of children and pets. Store at room temperature between 20 and 25 degrees C (68  and 77 degrees F). Get rid of any unused medication after the expiration date. To get rid of medications that are no longer needed or have expired: Take the  medication to a medication take-back program. Check with your pharmacy or law enforcement to find a location. If you cannot return the medication, check the label or package insert to see if the medication should be thrown out in the garbage or flushed down the toilet. If you are not sure, ask your care team. If it is safe to put it in the trash, take the medication out of the container. Mix the medication with cat litter, dirt, coffee grounds, or other unwanted substance. Seal the mixture in a bag or container. Put it in the trash. NOTE: This sheet is a summary. It may not cover all possible information. If you have questions about this medicine, talk to your doctor, pharmacist, or health care provider.  2024 [employer] (2021-05-29 00:00:00)

## 2023-01-16 ENCOUNTER — Ambulatory Visit
Admission: RE | Admit: 2023-01-16 | Discharge: 2023-01-16 | Disposition: A | Discharge status: Home / Self Care | Payer: 59 | Source: Ambulatory Visit | Attending: Psychiatry | Admitting: Psychiatry

## 2023-01-16 ENCOUNTER — Telehealth: Payer: Self-pay | Admitting: Psychiatry

## 2023-01-16 DIAGNOSIS — Z9189 Other specified personal risk factors, not elsewhere classified: Secondary | ICD-10-CM | POA: Diagnosis present

## 2023-01-16 DIAGNOSIS — Z5181 Encounter for therapeutic drug level monitoring: Secondary | ICD-10-CM | POA: Diagnosis not present

## 2023-01-16 NOTE — Telephone Encounter (Signed)
 I have reviewed EKG dated 01/16/2023.  Okay to continue current medication-Rexulti.

## 2023-01-17 NOTE — Telephone Encounter (Signed)
 left message with direction and information

## 2023-02-11 ENCOUNTER — Telehealth: Payer: 59 | Admitting: Psychiatry

## 2023-02-18 ENCOUNTER — Other Ambulatory Visit: Payer: Self-pay | Admitting: Nurse Practitioner

## 2023-02-18 DIAGNOSIS — Z1231 Encounter for screening mammogram for malignant neoplasm of breast: Secondary | ICD-10-CM

## 2023-03-07 ENCOUNTER — Ambulatory Visit
Admission: RE | Admit: 2023-03-07 | Discharge: 2023-03-07 | Disposition: A | Discharge status: Home / Self Care | Payer: 59 | Source: Ambulatory Visit | Attending: Nurse Practitioner | Admitting: Nurse Practitioner

## 2023-03-07 DIAGNOSIS — Z1231 Encounter for screening mammogram for malignant neoplasm of breast: Secondary | ICD-10-CM | POA: Insufficient documentation

## 2023-03-14 ENCOUNTER — Telehealth: Payer: 59 | Admitting: Psychiatry

## 2023-03-14 ENCOUNTER — Encounter: Payer: Self-pay | Admitting: Psychiatry

## 2023-03-14 DIAGNOSIS — F410 Panic disorder [episodic paroxysmal anxiety] without agoraphobia: Secondary | ICD-10-CM | POA: Diagnosis not present

## 2023-03-14 DIAGNOSIS — F424 Excoriation (skin-picking) disorder: Secondary | ICD-10-CM | POA: Diagnosis not present

## 2023-03-14 DIAGNOSIS — F4010 Social phobia, unspecified: Secondary | ICD-10-CM

## 2023-03-14 DIAGNOSIS — F331 Major depressive disorder, recurrent, moderate: Secondary | ICD-10-CM | POA: Diagnosis not present

## 2023-03-14 DIAGNOSIS — F4310 Post-traumatic stress disorder, unspecified: Secondary | ICD-10-CM

## 2023-03-14 DIAGNOSIS — G4701 Insomnia due to medical condition: Secondary | ICD-10-CM

## 2023-03-14 MED ORDER — CLONAZEPAM 0.5 MG PO TABS: 0.2500 mg | ORAL_TABLET | Freq: Every day | ORAL | 1 refills | Status: DC | PRN

## 2023-03-14 MED ORDER — HYDROXYZINE PAMOATE 50 MG PO CAPS: 50.0000 mg | ORAL_CAPSULE | Freq: Every day | ORAL | 1 refills | Status: DC | PRN

## 2023-03-14 MED ORDER — MIRTAZAPINE 15 MG PO TABS: 22.5000 mg | ORAL_TABLET | Freq: Every day | ORAL | 0 refills | Status: DC

## 2023-03-14 NOTE — Progress Notes (Signed)
 Virtual Visit via Video Note  I connected with Desaree Downen Mccoll on 03/14/23 at  4:30 PM EST by a video enabled telemedicine application and verified that I am speaking with the correct person using two identifiers.  Location Provider Location : [facility] Patient Location : Home  Participants: Patient , Provider   I discussed the limitations of evaluation and management by telemedicine and the availability of in person appointments. The patient expressed understanding and agreed to proceed.    I discussed the assessment and treatment plan with the patient. The patient was provided an opportunity to ask questions and all were answered. The patient agreed with the plan and demonstrated an understanding of the instructions.   The patient was advised to call back or seek an in-person evaluation if the symptoms worsen or if the condition fails to improve as anticipated.   BH MD OP Progress Note  03/16/2023 2:11 PM Aleynah Rocchio  MRN:  [id number]  Chief Complaint:  Chief Complaint  Patient presents with   Follow-up   Anxiety   Depression   Medication Refill   HPI: Khaleah Duer is a 63 year old Caucasian female, divorced, lives in [place], has a history of PTSD, skin picking disorder, MDD, panic disorder, social anxiety disorder, obstructive sleep apnea on CPAP, history of borderline personality disorder, fibromyalgia, total hip replacement per history, breast cancer per history was evaluated by telemedicine today.  She has been experiencing constant headaches since starting Rexulti, which began about a week after initiating the medication. The headaches vary in nature, sometimes involving shooting pain in the temple or lasting for hours. She does not typically experience headaches.  Her sleep has been disrupted, described as 'very weird,' with frequent awakenings every two hours accompanied by a sensation of needing to urinate. This change in sleep pattern also started about a week  after beginning Rexulti.  She has experienced nausea and vomiting twice, which is unusual for her, as she typically does not vomit easily. These episodes occurred a few weeks after starting Rexulti.  She is dealing with significant stress related to housing and financial issues, including potential eviction due to a non-functional BMW bike and difficulties with moving and storage logistics. She is also dealing with financial strain from car payments and other debts. Her depression is manageable, but she feels 'miserable' due to the side effects of Rexulti and her current life stressors.  She is currently taking Rexulti in the morning after breakfast.  She also takes Cymbalta 30 mg, mirtazapine 15 mg, and hydroxyzine capsules. Increasing Cymbalta to 60 mg did not change her symptoms, so she remains on 30 mg.  She currently denies any suicidality, homicidality or perceptual disturbances.  She does report skin picking as improved with the clonazepam.  Lately she has been taking it almost every day.  She is not interested in referral for therapy due to financial constraints.    Visit Diagnosis:    ICD-10-CM   1. PTSD (post-traumatic stress disorder)  F43.10 hydrOXYzine (VISTARIL) 50 MG capsule    2. Skin-picking disorder  F42.4     3. MDD (major depressive disorder), recurrent episode, moderate (HCC)  F33.1     4. Panic disorder  F41.0 mirtazapine (REMERON) 15 MG tablet    clonazePAM (KLONOPIN) 0.5 MG tablet    5. Social anxiety disorder  F40.10     6. Insomnia due to medical condition  G47.01    PTSD, obstructive sleep apnea on CPAP      Past Psychiatric  History: I have reviewed past psychiatric history from progress note on 03/10/2018.  Past Medical History:  Past Medical History:  Diagnosis Date   Anxiety    Arthritis    Cancer (HCC)    breast left   Depression    Fibromyalgia    GERD (gastroesophageal reflux disease)    Headache    History of kidney stones    h/o    Hypertension    Hypothyroidism    Sleep apnea    DOES NOT USE CPAP   Thyroid disease     Past Surgical History:  Procedure Laterality Date   APPENDECTOMY     BREAST BIOPSY Left few yrs ago   stereotactic bx in [place], benign   BREAST BIOPSY Left 12/26/2017   Affirm Bx [facility]- X-Clip   BREAST BIOPSY Left 01/14/2018   positive- pt had mastectomy   BREAST IMPLANT EXCHANGE Right 02/16/2021   Procedure: PLACEMENT OF BREAST IMPLANT;  Surgeon: Peggye Form, DO;  Location: [facility];  Service: Plastics;  Laterality: Right;  1.5 hour   BREAST RECONSTRUCTION WITH PLACEMENT OF TISSUE EXPANDER AND ALLODERM Left 04/08/2018   Procedure: LEFT BREAST IMMEDIATE  RECONSTRUCTION WITH EXPANDER AND FLEX HD;  Surgeon: Peggye Form, DO;  Location: [facility];  Service: Plastics;  Laterality: Left;   COLONOSCOPY WITH PROPOFOL N/A 06/26/2018   Procedure: COLONOSCOPY WITH PROPOFOL;  Surgeon: Pasty Spillers, MD;  Location: [facility];  Service: Endoscopy;  Laterality: N/A;   DRUG INDUCED ENDOSCOPY Right 02/27/2022   Procedure: DRUG INDUCED ENDOSCOPY;  Surgeon: Christia Reading, MD;  Location: [facility];  Service: ENT;  Laterality: Right;   ESOPHAGOGASTRODUODENOSCOPY (EGD) WITH PROPOFOL N/A 06/26/2018   Procedure: ESOPHAGOGASTRODUODENOSCOPY (EGD) WITH PROPOFOL;  Surgeon: Pasty Spillers, MD;  Location: [facility];  Service: Endoscopy;  Laterality: N/A;   EYE SURGERY Bilateral    lasik   FOOT BONE EXCISION     IMAGE GUIDED SINUS SURGERY  10/220   LIPOSUCTION Bilateral 02/16/2021   Procedure: LIPOSUCTION OF BILATERAL BREAST;  Surgeon: Peggye Form, DO;  Location: [facility];  Service: Plastics;  Laterality: Bilateral;   LIPOSUCTION WITH LIPOFILLING Left 04/30/2019   Procedure: Fat grafting to left breast;  Surgeon: Peggye Form, DO;  Location: [facility];  Service: Plastics;  Laterality: Left;  90 min, please    MASTECTOMY Left 04/08/2019   MASTECTOMY W/ SENTINEL NODE BIOPSY Left 04/08/2018   Procedure: MASTECTOMY WITH SENTINEL LYMPH NODE BIOPSY LEFT;  Surgeon: Duanne Guess, MD;  Location: [facility];  Service: General;  Laterality: Left;   MASTOPEXY Right 08/04/2018   Procedure: MASTOPEXY;  Surgeon: Peggye Form, DO;  Location: [facility];  Service: Plastics;  Laterality: Right;  TOTAL CASE TIME SHOULD BE 3 HOURS, PLEASE   REMOVAL OF TISSUE EXPANDER AND PLACEMENT OF IMPLANT Left 08/04/2018   Procedure: REMOVAL OF TISSUE EXPANDER AND PLACEMENT OF IMPLANT;  Surgeon: Peggye Form, DO;  Location: [facility];  Service: Plastics;  Laterality: Left;   SCAR REVISION Left 04/30/2019   Procedure: release of scar contracture to left breast;  Surgeon: Peggye Form, DO;  Location: [facility];  Service: Plastics;  Laterality: Left;   TOTAL HIP ARTHROPLASTY Left 02/11/2020   Procedure: TOTAL HIP ARTHROPLASTY ANTERIOR APPROACH;  Surgeon: Kennedy Bucker, MD;  Location: [facility];  Service: Orthopedics;  Laterality: Left;   UMBILICAL HERNIA REPAIR  1983 and 1985    Family Psychiatric History: I have  reviewed family psychiatric history from progress note on 03/10/2018.  Family History:  Family History  Problem Relation Age of Onset   Hypertension Mother    Lung cancer Maternal Grandmother    Heart disease Father    Alcohol abuse Father    Breast cancer Neg Hx    Colon cancer Neg Hx     Social History: I have reviewed social history from progress note on 03/10/2018. Social History   Socioeconomic History   Marital status: Divorced    Spouse name: Not on file   Number of children: 1   Years of education: Not on file   Highest education level: High school graduate  Occupational History   Not on file  Tobacco Use   Smoking status: Former    Current packs/day: 0.00    Average packs/day: 1 pack/day for 2.0 years (2.0 ttl pk-yrs)    Types: Cigarettes    Start date: 01/07/2015     Quit date: 01/06/2017    Years since quitting: 6.1   Smokeless tobacco: Never  Vaping Use   Vaping status: Former   Quit date: 11/27/2016  Substance and Sexual Activity   Alcohol use: Yes    Comment: rare beer    Drug use: Not Currently   Sexual activity: Not on file  Other Topics Concern   Not on file  Social History Narrative   Home on disability [pysche problems]; transportation issues; worked in Chief Financial Officer. From [place]; moved after separation. Quit smoking; ocassional alcohol.    Social Drivers of Health   Financial Resource Strain: High Risk (12/31/2019)   Overall Financial Resource Strain (CARDIA)    Difficulty of Paying Living Expenses: Very hard  Food Insecurity: Food Insecurity Present (12/31/2019)   Hunger Vital Sign    Worried About Running Out of Food in the Last Year: Often true    Ran Out of Food in the Last Year: Often true  Transportation Needs: Unmet Transportation Needs (03/10/2018)   PRAPARE - Administrator, Civil Service (Medical): Yes    Lack of Transportation (Non-Medical): Yes  Physical Activity: Inactive (03/10/2018)   Exercise Vital Sign    Days of Exercise per Week: 0 days    Minutes of Exercise per Session: 0 min  Stress: Stress Concern Present (12/31/2019)   [employer] of Occupational Health - Occupational Stress Questionnaire    Feeling of Stress : Very much  Social Connections: Unknown (03/10/2018)   Social Connection and Isolation Panel [NHANES]    Frequency of Communication with Friends and Family: Not on file    Frequency of Social Gatherings with Friends and Family: Not on file    Attends Religious Services: Never    Database administrator or Organizations: No    Attends Banker Meetings: Never    Marital Status: Divorced    Allergies:  Allergies  Allergen Reactions   Tape Other (See Comments)    Steri strips after breast surgery/Burned skin   Tegaderm and other tape OK    Metabolic Disorder  Labs: No results found for: "HGBA1C", "MPG" No results found for: "PROLACTIN" No results found for: "CHOL", "TRIG", "HDL", "CHOLHDL", "VLDL", "LDLCALC" No results found for: "TSH"  Therapeutic Level Labs: No results found for: "LITHIUM" No results found for: "VALPROATE" No results found for: "CBMZ"  Current Medications: Current Outpatient Medications  Medication Sig Dispense Refill   albuterol (VENTOLIN HFA) 108 (90 Base) MCG/ACT inhaler Inhale 2 puffs into the lungs every 6 (six) hours  as needed for wheezing or shortness of breath. 8 g 6   atorvastatin (LIPITOR) 20 MG tablet Take 20 mg by mouth at bedtime.     Biotin (SUPER BIOTIN) 5 MG TABS Take by mouth.     busPIRone (BUSPAR) 10 MG tablet Take 2 tablets (20 mg total) by mouth 3 (three) times daily. 540 tablet 1   clindamycin (CLINDAGEL) 1 % gel SMARTSIG:1 sparingly Topical Twice Daily     [START ON 03/27/2023] clonazePAM (KLONOPIN) 0.5 MG tablet Take 0.5-1 tablets (0.25-0.5 mg total) by mouth daily as needed for anxiety. Please limit use and use only for severe anxiety 30 tablet 1   Coenzyme Q10 (COQ10) 100 MG CAPS Take 100 mg by mouth every evening.     DULoxetine (CYMBALTA) 30 MG capsule Take 1 capsule (30 mg total) by mouth daily. 90 capsule 1   hydrochlorothiazide (HYDRODIURIL) 25 MG tablet Take 25 mg by mouth daily.      hydrocortisone cream 1 % Apply 1 a small amount to skin twice a day for 14 days     Hydrocortisone, Perianal, 1 % CREA Apply topically.     hydrOXYzine (VISTARIL) 50 MG capsule Take 1 capsule (50 mg total) by mouth daily as needed. 90 capsule 1   levothyroxine (SYNTHROID) 75 MCG tablet Take 75 mcg by mouth every other day. Alternating with 50 mcg every other day-AM     levothyroxine (SYNTHROID, LEVOTHROID) 50 MCG tablet Take 50 mcg by mouth every other day. Alternating with 75 mcg every other day-AM     losartan (COZAAR) 100 MG tablet Take 100 mg by mouth daily. for high blood pressure     methocarbamol (ROBAXIN)  500 MG tablet Take 250-500 mg by mouth at bedtime as needed.     metoprolol (LOPRESSOR) 50 MG tablet Take 1 tablet (50 mg total) by mouth 2 (two) times daily. (Patient taking differently: Take 50 mg by mouth daily.) 60 tablet 0   mirtazapine (REMERON) 15 MG tablet Take 1.5 tablets (22.5 mg total) by mouth at bedtime. 135 tablet 0   Multiple Vitamin (MULTIVITAMIN WITH MINERALS) TABS tablet Take 1 tablet by mouth daily.     NONFORMULARY OR COMPOUNDED ITEM 1 Device by Other route as directed. CPAP-with the pressure setting of 10 cm H20 with heated humidity and ResMed AirFit F20 Full Face Mask , size small. 1 each 0   Omega-3 Fatty Acids (FISH OIL PO) Take 1 capsule by mouth daily.     omeprazole (PRILOSEC) 20 MG capsule Take 20 mg by mouth every morning.     No current facility-administered medications for this visit.     Musculoskeletal: Strength & Muscle Tone:  UTA Gait & Station:  Seated Patient leans: N/A  Psychiatric Specialty Exam: Review of Systems  Psychiatric/Behavioral:  Positive for dysphoric mood and sleep disturbance. The patient is nervous/anxious.     Last menstrual period 02/05/2016.There is no height or weight on file to calculate BMI.  General Appearance: Casual  Eye Contact:  Fair  Speech:  Clear and Coherent  Volume:  Normal  Mood:  Anxious and Dysphoric  Affect:  Congruent  Thought Process:  Goal Directed and Descriptions of Associations: Intact  Orientation:  Full (Time, Place, and Person)  Thought Content: Logical   Suicidal Thoughts:  No  Homicidal Thoughts:  No  Memory:  Immediate;   Fair Recent;   Fair Remote;   Fair  Judgement:  Fair  Insight:  Fair  Psychomotor Activity:  Normal  Concentration:  Concentration: Fair and Attention Span: Fair  Recall:  [employer] of Knowledge: Fair  Language: Fair  Akathisia:  No  Handed:  Right  AIMS (if indicated): not done  Assets:  Desire for Improvement Housing Talents/Skills Transportation  ADL's:  Intact   Cognition: WNL  Sleep:  Poor   Screenings: AIMS    Flowsheet Row Video Visit from 06/22/2021 in [facility] Video Visit from 05/22/2021 in [facility]  AIMS Total Score 0 0      GAD-7    Flowsheet Row Office Visit from 11/13/2022 in [facility] Office Visit from 01/17/2022 in [facility] Video Visit from 11/16/2021 in [facility] Video Visit from 09/28/2021 in [facility] Video Visit from 07/26/2021 in [facility]  Total GAD-7 Score 19 14 9 14 4       PHQ2-9    Flowsheet Row Office Visit from 11/13/2022 in [facility] Office Visit from 01/17/2022 in [facility] Video Visit from 09/28/2021 in [facility] Video Visit from 07/26/2021 in [facility] Video Visit from 06/22/2021 in [facility]  PHQ-2 Total Score 1 1 1  0 0  PHQ-9 Total Score -- 5 -- -- --      Flowsheet Row Video Visit from 03/14/2023 in [facility] Video Visit from 01/07/2023 in [facility] Office Visit from 11/13/2022 in [facility]  C-SSRS RISK CATEGORY Moderate Risk Moderate Risk Moderate Risk        Assessment and Plan: Zarai Orsborn is a 63 year old Caucasian female, divorced, has a history of multiple medical problems including chronic pain, currently with worsening mood symptoms, possible side effects of Rexulti as well as sleep issues and multiple situational stressors, discussed assessment and plan as noted  below.  Depression-unstable Currently managed with Cymbalta 30 mg and mirtazapine 15 mg. Higher Cymbalta dose was ineffective. Experiencing stress and anxiety due to life circumstances, but not feeling depressed. - Increase Mirtazapine to 22.5 mg at night - Continue Cymbalta 30 mg daily - Continue BuSpar 20 mg 3 times daily - Discontinue Rexulti for side effects. - Discussed addition of another atypical antipsychotic medication to replace the Rexulti, patient declines.  Panic disorder/Social anxiety disorder-unstable Significant stress and anxiety related to housing, finances, and relationships. Feels overwhelmed and hesitant about therapy. - Discuss therapy options - Encourage focusing on one task at a time to reduce stress - Continue Hydroxyzine 50 mg daily as needed  PTSD-improving Currently denies any significant nightmares although sleep is affected.  Denies any flashbacks or intrusive memories. - Continue Cymbalta 30 mg daily - Patient declines referral for therapy.  Insomnia-unstable Disrupted sleep patterns, waking every two hours, possibly due to stress and anxiety. Uses hydroxyzine and clonazepam as needed. Caution against regular clonazepam use to prevent tolerance. - Increase Mirtazapine to 22.5 mg at night - Continue Hydroxyzine 50 mg twice daily - Advise against regular use of Clonazepam - Continue CPAP for OSA  Skin picking disorder-improving Scarring on legs from previous skin picking. Currently not engaging in behavior due to other stressors. Dermatologist intervention needed for scarring treatment. - Consult dermatologist for scarring treatment - Continue current medication regimen including Clonazepam as needed  Follow-up - Follow-up in clinic in 4 to 5 weeks or sooner if needed.  Patient prefers to have a follow-up in May and agrees to reach out sooner if she has any concerns.  Collaboration of Care: Collaboration of Care: Patient refused AEB patient declines  referral for therapy.  Patient/Guardian was advised Release of Information must be obtained prior to any record release in order to collaborate their care with an outside provider. Patient/Guardian was advised if they have not already done so to contact the registration department to sign all necessary forms in order for [place] to release information regarding their care.   Consent: Patient/Guardian gives verbal consent for treatment and assignment of benefits for services provided during this visit. Patient/Guardian expressed understanding and agreed to proceed.  Discussed the use of a AI scribe software for clinical note transcription with the patient, who gave verbal consent to proceed.  This note was generated in part or whole with voice recognition software. Voice recognition is usually quite accurate but there are transcription errors that can and very often do occur. I apologize for any typographical errors that were not detected and corrected.     Jomarie Longs, MD 03/16/2023, 2:11 PM

## 2023-05-13 ENCOUNTER — Telehealth (INDEPENDENT_AMBULATORY_CARE_PROVIDER_SITE_OTHER): Admitting: Psychiatry

## 2023-05-13 ENCOUNTER — Encounter: Payer: Self-pay | Admitting: Psychiatry

## 2023-05-13 DIAGNOSIS — F410 Panic disorder [episodic paroxysmal anxiety] without agoraphobia: Secondary | ICD-10-CM

## 2023-05-13 DIAGNOSIS — F4010 Social phobia, unspecified: Secondary | ICD-10-CM

## 2023-05-13 DIAGNOSIS — F424 Excoriation (skin-picking) disorder: Secondary | ICD-10-CM

## 2023-05-13 DIAGNOSIS — F4310 Post-traumatic stress disorder, unspecified: Secondary | ICD-10-CM | POA: Diagnosis not present

## 2023-05-13 DIAGNOSIS — F331 Major depressive disorder, recurrent, moderate: Secondary | ICD-10-CM | POA: Diagnosis not present

## 2023-05-13 DIAGNOSIS — G4701 Insomnia due to medical condition: Secondary | ICD-10-CM

## 2023-05-13 MED ORDER — BUSPIRONE HCL 10 MG PO TABS: 20.0000 mg | ORAL_TABLET | Freq: Three times a day (TID) | ORAL | 1 refills | Status: DC

## 2023-05-13 MED ORDER — DULOXETINE HCL 30 MG PO CPEP: 30.0000 mg | ORAL_CAPSULE | Freq: Every day | ORAL | 1 refills | Status: DC

## 2023-05-13 NOTE — Progress Notes (Unsigned)
 Virtual Visit via Video Note  I connected with Wilmoth Clohessy Foskett on 05/13/23 at  3:30 PM EDT by a video enabled telemedicine application and verified that I am speaking with the correct person using two identifiers.  Location Provider Location : [facility] Patient Location : Home  Participants: Patient , Provider   I discussed the limitations of evaluation and management by telemedicine and the availability of in person appointments. The patient expressed understanding and agreed to proceed.   I discussed the assessment and treatment plan with the patient. The patient was provided an opportunity to ask questions and all were answered. The patient agreed with the plan and demonstrated an understanding of the instructions.   The patient was advised to call back or seek an in-person evaluation if the symptoms worsen or if the condition fails to improve as anticipated.  BH MD OP Progress Note  05/13/2023 4:20 PM Asly Pyles  MRN:  [id number]  Chief Complaint:  Chief Complaint  Patient presents with   Follow-up   Depression   Anxiety   Medication Refill   Discussed the use of AI scribe software for clinical note transcription with the patient, who gave verbal consent to proceed.  History of Present Illness Jennis Thornberg is a 63 year old Caucasian female, divorced, lives in [place], has a history of PTSD, skin picking disorder, MDD, panic disorder, social anxiety disorder, obstructive sleep apnea on CPAP, history of borderline personality disorder, fibromyalgia, total hip replacement per history, breast cancer was evaluated by telemedicine today presents with situational anxiety.  She is experiencing ongoing situational stress related to housing and financial issues. A recent incident with a friend who took her bike and is now claiming to sell his house has added to her stress. Additionally, a new neighbor who smokes is causing her bedroom to smell, and she is concerned about the  upcoming winter as she cannot keep her window open all the time. Significant stress is also due to her living situation and financial constraints, including increased storage unit fees and the need to move items out of storage. She was stuck at home for two days due to a dead car battery, which added to her stress.  She has a history of a left hip replacement following an accident and is experiencing sharp pain in her left hip when her leg moves sideways, causing her leg to give out. This occurs at home or when walking in stores. She has scheduled an appointment for May 22 to address this issue.  She is currently taking mirtazapine , which was increased to a tablet and a half, and reports sleeping well. However, she is concerned about spitting out a mouthful of blood , which she describes as 'red, not like saliva.' She is unsure if this is related to her CPAP use and is trying to recall which doctor to contact regarding this issue.  She has a history of fibromyalgia and arthritis, and she has previously tried methotrexate and prednisone for her symptoms. She is not currently taking Buspar , as she found it ineffective for her anxiety and depression, and she has a supply of clonazepam  that she uses infrequently.  She denies any current suicidality, homicidality or perceptual disturbances.    Visit Diagnosis:    ICD-10-CM   1. PTSD (post-traumatic stress disorder)  F43.10 DULoxetine  (CYMBALTA ) 30 MG capsule    busPIRone  (BUSPAR ) 10 MG tablet    2. Skin-picking disorder  F42.4 DULoxetine  (CYMBALTA ) 30 MG capsule    busPIRone  (  BUSPAR ) 10 MG tablet    3. MDD (major depressive disorder), recurrent episode, moderate (HCC)  F33.1     4. Panic disorder  F41.0 DULoxetine  (CYMBALTA ) 30 MG capsule    busPIRone  (BUSPAR ) 10 MG tablet    5. Social anxiety disorder  F40.10 DULoxetine  (CYMBALTA ) 30 MG capsule    busPIRone  (BUSPAR ) 10 MG tablet    6. Insomnia due to medical condition  G47.01    PTSD,  obstructive sleep apnea on CPAP      Past Psychiatric History: I have reviewed past psychiatric history from progress note on 03/10/2018.  Past trials of medications multiple including BuSpar   Past Medical History:  Past Medical History:  Diagnosis Date   Anxiety    Arthritis    Cancer (HCC)    breast left   Depression    Fibromyalgia    GERD (gastroesophageal reflux disease)    Headache    History of kidney stones    h/o   Hypertension    Hypothyroidism    Sleep apnea    DOES NOT USE CPAP   Thyroid disease     Past Surgical History:  Procedure Laterality Date   APPENDECTOMY     BREAST BIOPSY Left few yrs ago   stereotactic bx in [place], benign   BREAST BIOPSY Left 12/26/2017   Affirm Bx [facility]- X-Clip   BREAST BIOPSY Left 01/14/2018   positive- pt had mastectomy   BREAST IMPLANT EXCHANGE Right 02/16/2021   Procedure: PLACEMENT OF BREAST IMPLANT;  Surgeon: Thornell Flirt, DO;  Location: [facility];  Service: Plastics;  Laterality: Right;  1.5 hour   BREAST RECONSTRUCTION WITH PLACEMENT OF TISSUE EXPANDER AND ALLODERM Left 04/08/2018   Procedure: LEFT BREAST IMMEDIATE  RECONSTRUCTION WITH EXPANDER AND FLEX HD;  Surgeon: Thornell Flirt, DO;  Location: [facility];  Service: Plastics;  Laterality: Left;   COLONOSCOPY WITH PROPOFOL  N/A 06/26/2018   Procedure: COLONOSCOPY WITH PROPOFOL ;  Surgeon: Irby Mannan, MD;  Location: [facility];  Service: Endoscopy;  Laterality: N/A;   DRUG INDUCED ENDOSCOPY Right 02/27/2022   Procedure: DRUG INDUCED ENDOSCOPY;  Surgeon: Virgina Grills, MD;  Location: [facility];  Service: ENT;  Laterality: Right;   ESOPHAGOGASTRODUODENOSCOPY (EGD) WITH PROPOFOL  N/A 06/26/2018   Procedure: ESOPHAGOGASTRODUODENOSCOPY (EGD) WITH PROPOFOL ;  Surgeon: Irby Mannan, MD;  Location: [facility];  Service: Endoscopy;  Laterality: N/A;   EYE SURGERY Bilateral    lasik   FOOT BONE EXCISION     IMAGE GUIDED  SINUS SURGERY  10/220   LIPOSUCTION Bilateral 02/16/2021   Procedure: LIPOSUCTION OF BILATERAL BREAST;  Surgeon: Thornell Flirt, DO;  Location: [facility];  Service: Plastics;  Laterality: Bilateral;   LIPOSUCTION WITH LIPOFILLING Left 04/30/2019   Procedure: Fat grafting to left breast;  Surgeon: Thornell Flirt, DO;  Location: [facility];  Service: Plastics;  Laterality: Left;  90 min, please   MASTECTOMY Left 04/08/2019   MASTECTOMY W/ SENTINEL NODE BIOPSY Left 04/08/2018   Procedure: MASTECTOMY WITH SENTINEL LYMPH NODE BIOPSY LEFT;  Surgeon: Mercy Stall, MD;  Location: [facility];  Service: General;  Laterality: Left;   MASTOPEXY Right 08/04/2018   Procedure: MASTOPEXY;  Surgeon: Thornell Flirt, DO;  Location: [facility];  Service: Plastics;  Laterality: Right;  TOTAL CASE TIME SHOULD BE 3 HOURS, PLEASE   REMOVAL OF TISSUE EXPANDER AND PLACEMENT OF IMPLANT Left 08/04/2018   Procedure: REMOVAL OF TISSUE EXPANDER AND PLACEMENT OF IMPLANT;  Surgeon: Thornell Flirt, DO;  Location: [facility];  Service: Plastics;  Laterality: Left;   SCAR REVISION Left 04/30/2019   Procedure: release of scar contracture to left breast;  Surgeon: Thornell Flirt, DO;  Location: [facility];  Service: Plastics;  Laterality: Left;   TOTAL HIP ARTHROPLASTY Left 02/11/2020   Procedure: TOTAL HIP ARTHROPLASTY ANTERIOR APPROACH;  Surgeon: Molli Angelucci, MD;  Location: [facility];  Service: Orthopedics;  Laterality: Left;   UMBILICAL HERNIA REPAIR  1983 and 1985    Family Psychiatric History: I have reviewed family psychiatric history from progress note on 03/10/2018.  Family History:  Family History  Problem Relation Age of Onset   Hypertension Mother    Lung cancer Maternal Grandmother    Heart disease Father    Alcohol abuse Father    Breast cancer Neg Hx    Colon cancer Neg Hx     Social History: I have reviewed social history from progress  note on 03/10/2018. Social History   Socioeconomic History   Marital status: Divorced    Spouse name: Not on file   Number of children: 1   Years of education: Not on file   Highest education level: High school graduate  Occupational History   Not on file  Tobacco Use   Smoking status: Former    Current packs/day: 0.00    Average packs/day: 1 pack/day for 2.0 years (2.0 ttl pk-yrs)    Types: Cigarettes    Start date: 01/07/2015    Quit date: 01/06/2017    Years since quitting: 6.3   Smokeless tobacco: Never  Vaping Use   Vaping status: Former   Quit date: 11/27/2016  Substance and Sexual Activity   Alcohol use: Yes    Comment: rare beer    Drug use: Not Currently   Sexual activity: Not on file  Other Topics Concern   Not on file  Social History Narrative   Home on disability [pysche problems]; transportation issues; worked in Chief Financial Officer. From [place]; moved after separation. Quit smoking; ocassional alcohol.    Social Drivers of Health   Financial Resource Strain: High Risk (12/31/2019)   Overall Financial Resource Strain (CARDIA)    Difficulty of Paying Living Expenses: Very hard  Food Insecurity: Food Insecurity Present (12/31/2019)   Hunger Vital Sign    Worried About Running Out of Food in the Last Year: Often true    Ran Out of Food in the Last Year: Often true  Transportation Needs: Unmet Transportation Needs (03/10/2018)   PRAPARE - Administrator, Civil Service (Medical): Yes    Lack of Transportation (Non-Medical): Yes  Physical Activity: Inactive (03/10/2018)   Exercise Vital Sign    Days of Exercise per Week: 0 days    Minutes of Exercise per Session: 0 min  Stress: Stress Concern Present (12/31/2019)   [employer] of Occupational Health - Occupational Stress Questionnaire    Feeling of Stress : Very much  Social Connections: Unknown (03/10/2018)   Social Connection and Isolation Panel [NHANES]    Frequency of Communication with Friends  and Family: Not on file    Frequency of Social Gatherings with Friends and Family: Not on file    Attends Religious Services: Never    Database administrator or Organizations: No    Attends Banker Meetings: Never    Marital Status: Divorced    Allergies:  Allergies  Allergen Reactions   Tape Other (See Comments)  Steri strips after breast surgery/Burned skin   Tegaderm and other tape OK    Metabolic Disorder Labs: No results found for: "HGBA1C", "MPG" No results found for: "PROLACTIN" No results found for: "CHOL", "TRIG", "HDL", "CHOLHDL", "VLDL", "LDLCALC" No results found for: "TSH"  Therapeutic Level Labs: No results found for: "LITHIUM" No results found for: "VALPROATE" No results found for: "CBMZ"  Current Medications: Current Outpatient Medications  Medication Sig Dispense Refill   albuterol  (VENTOLIN  HFA) 108 (90 Base) MCG/ACT inhaler Inhale 2 puffs into the lungs every 6 (six) hours as needed for wheezing or shortness of breath. 8 g 6   atorvastatin (LIPITOR) 20 MG tablet Take 20 mg by mouth at bedtime.     Biotin (SUPER BIOTIN) 5 MG TABS Take by mouth.     busPIRone  (BUSPAR ) 10 MG tablet Take 2 tablets (20 mg total) by mouth 3 (three) times daily. (Patient not taking: Reported on 05/13/2023) 540 tablet 1   clindamycin (CLINDAGEL) 1 % gel SMARTSIG:1 sparingly Topical Twice Daily     clonazePAM  (KLONOPIN ) 0.5 MG tablet Take 0.5-1 tablets (0.25-0.5 mg total) by mouth daily as needed for anxiety. Please limit use and use only for severe anxiety 30 tablet 1   Coenzyme Q10 (COQ10) 100 MG CAPS Take 100 mg by mouth every evening.     DULoxetine  (CYMBALTA ) 30 MG capsule Take 1 capsule (30 mg total) by mouth daily. 90 capsule 1   hydrochlorothiazide (HYDRODIURIL) 25 MG tablet Take 25 mg by mouth daily.      hydrocortisone cream 1 % Apply 1 a small amount to skin twice a day for 14 days     Hydrocortisone, Perianal, 1 % CREA Apply topically.     hydrOXYzine   (VISTARIL ) 50 MG capsule Take 1 capsule (50 mg total) by mouth daily as needed. 90 capsule 1   levothyroxine  (SYNTHROID ) 75 MCG tablet Take 75 mcg by mouth every other day. Alternating with 50 mcg every other day-AM     levothyroxine  (SYNTHROID , LEVOTHROID) 50 MCG tablet Take 50 mcg by mouth every other day. Alternating with 75 mcg every other day-AM     losartan  (COZAAR ) 100 MG tablet Take 100 mg by mouth daily. for high blood pressure     methocarbamol  (ROBAXIN ) 500 MG tablet Take 250-500 mg by mouth at bedtime as needed.     metoprolol  (LOPRESSOR ) 50 MG tablet Take 1 tablet (50 mg total) by mouth 2 (two) times daily. (Patient taking differently: Take 50 mg by mouth daily.) 60 tablet 0   mirtazapine  (REMERON ) 15 MG tablet Take 1.5 tablets (22.5 mg total) by mouth at bedtime. 135 tablet 0   Multiple Vitamin (MULTIVITAMIN WITH MINERALS) TABS tablet Take 1 tablet by mouth daily.     NONFORMULARY OR COMPOUNDED ITEM 1 Device by Other route as directed. CPAP-with the pressure setting of 10 cm H20 with heated humidity and ResMed AirFit F20 Full Face Mask , size small. 1 each 0   Omega-3 Fatty Acids (FISH OIL PO) Take 1 capsule by mouth daily.     omeprazole (PRILOSEC) 20 MG capsule Take 20 mg by mouth every morning.     No current facility-administered medications for this visit.     Musculoskeletal: Strength & Muscle Tone:  UTA Gait & Station:  Seated Patient leans: N/A  Psychiatric Specialty Exam: Review of Systems  Psychiatric/Behavioral:  Positive for sleep disturbance. The patient is nervous/anxious.     Last menstrual period 02/05/2016.There is no height or weight on file to  calculate BMI.  General Appearance: Casual  Eye Contact:  Fair  Speech:  Clear and Coherent  Volume:  Normal  Mood:  Anxious  Affect:  Congruent  Thought Process:  Goal Directed and Descriptions of Associations: Intact  Orientation:  Full (Time, Place, and Person)  Thought Content: Logical   Suicidal Thoughts:   No  Homicidal Thoughts:  No  Memory:  Immediate;   Fair Recent;   Fair Remote;   Fair  Judgement:  Fair  Insight:  Fair  Psychomotor Activity:  Normal  Concentration:  Concentration: Fair and Attention Span: Fair  Recall:  [employer] of Knowledge: Fair  Language: Fair  Akathisia:  No  Handed:  Right  AIMS (if indicated): not done  Assets:  Desire for Improvement Housing Social Support Transportation  ADL's:  Intact  Cognition: WNL  Sleep:  Poor   Screenings: AIMS    Flowsheet Row Video Visit from 06/22/2021 in [facility] Video Visit from 05/22/2021 in [facility]  AIMS Total Score 0 0      GAD-7    Flowsheet Row Office Visit from 11/13/2022 in [facility] Office Visit from 01/17/2022 in [facility] Video Visit from 11/16/2021 in [facility] Video Visit from 09/28/2021 in [facility] Video Visit from 07/26/2021 in [facility]  Total GAD-7 Score 19 14 9 14 4       PHQ2-9    Flowsheet Row Office Visit from 11/13/2022 in [facility] Office Visit from 01/17/2022 in [facility] Video Visit from 09/28/2021 in [facility] Video Visit from 07/26/2021 in [facility] Video Visit from 06/22/2021 in [facility]  PHQ-2 Total Score 1 1 1  0 0  PHQ-9 Total Score -- 5 -- -- --      Flowsheet Row Video Visit from 03/14/2023 in [facility] Video Visit from 01/07/2023 in [facility] Office Visit from 11/13/2022 in [facility]  C-SSRS RISK CATEGORY Moderate Risk Moderate Risk Moderate Risk        Assessment and Plan: Montasia Klose is a 63 year old Caucasian female, currently with multiple situational stressors which does cause anxiety as well as sleep issues although she is not interested in further medication management as well as unable to afford psychotherapy sessions at this time, discussed plan as noted below.  Depression-unstable Current depression symptoms due to current situational stressors.  Currently compliant on her medications except for BuSpar  which she discontinued due to lack of benefit.  Patient declines referral for psychotherapy.  She believed the higher dosage of mirtazapine  may be beneficial and would like to stay on this dose. - Continue Mirtazapine  22.5 mg at bedtime - Continue Cymbalta  30 mg daily - Discontinue BuSpar  lack of benefit and noncompliance. -Patient declines referral for psychotherapy.  Panic disorder/social anxiety disorder improving Current dosage of mirtazapine  beneficial with anxiety and sleep although continues to have situational stressors which does trigger her anxiety. -Continue hydroxyzine  50 mg daily as needed -Continue medications as noted above. - Patient declines referral for psychotherapy  PTSD-improving Currently reports medications as beneficial although continues to have situational triggers. -Continue Cymbalta  30 mg daily -Continue mirtazapine  as prescribed -Patient declines referral for  therapy.  Insomnia-unstable Sleep problems multifactorial including CPAP problems. -Continue mirtazapine  22.5 mg at bedtime which has improved sleep. -Patient to continue CPAP for OSA. -Patient to reach out to CPAP provider as well as primary care provider regarding her current physical problems.  Skin picking disorder-improving Did not report any concerns at this visit.  Clonazepam  helps when she uses it as needed.  Agreeable to  limit use. -Continue clonazepam  0.25-0.5 mg daily as needed. - Reviewed [place] PMP AWARxE  Follow-up Follow-up in clinic in 4 months or sooner as needed.  Collaboration of Care: Collaboration of Care: Referral or follow-up with counselor/therapist AEB patient declines referral for CBT.  Patient advised to follow up with primary care provider for her physical complaints.  Patient/Guardian was advised Release of Information must be obtained prior to any record release in order to collaborate their care with an outside provider. Patient/Guardian was advised if they have not already done so to contact the registration department to sign all necessary forms in order for us  to release information regarding their care.   Consent: Patient/Guardian gives verbal consent for treatment and assignment of benefits for services provided during this visit. Patient/Guardian expressed understanding and agreed to proceed.  This note was generated in part or whole with voice recognition software. Voice recognition is usually quite accurate but there are transcription errors that can and very often do occur. I apologize for any typographical errors that were not detected and corrected.     Delight Bickle, MD 05/13/2023, 4:20 PM

## 2023-05-13 NOTE — Patient Instructions (Signed)
  [email or web address]

## 2023-06-04 ENCOUNTER — Other Ambulatory Visit: Payer: Self-pay | Admitting: Orthopedic Surgery

## 2023-06-04 DIAGNOSIS — M4807 Spinal stenosis, lumbosacral region: Secondary | ICD-10-CM

## 2023-06-08 ENCOUNTER — Ambulatory Visit
Admission: RE | Admit: 2023-06-08 | Discharge: 2023-06-08 | Disposition: A | Discharge status: Home / Self Care | Source: Ambulatory Visit | Attending: Orthopedic Surgery | Admitting: Orthopedic Surgery

## 2023-06-08 DIAGNOSIS — M4807 Spinal stenosis, lumbosacral region: Secondary | ICD-10-CM | POA: Diagnosis present

## 2023-06-09 ENCOUNTER — Other Ambulatory Visit: Payer: Self-pay | Admitting: Psychiatry

## 2023-06-09 DIAGNOSIS — F410 Panic disorder [episodic paroxysmal anxiety] without agoraphobia: Secondary | ICD-10-CM

## 2023-08-26 ENCOUNTER — Encounter: Payer: Self-pay | Admitting: Psychiatry

## 2023-08-26 ENCOUNTER — Telehealth (INDEPENDENT_AMBULATORY_CARE_PROVIDER_SITE_OTHER): Admitting: Psychiatry

## 2023-08-26 DIAGNOSIS — F4310 Post-traumatic stress disorder, unspecified: Secondary | ICD-10-CM

## 2023-08-26 DIAGNOSIS — G4701 Insomnia due to medical condition: Secondary | ICD-10-CM

## 2023-08-26 DIAGNOSIS — G4733 Obstructive sleep apnea (adult) (pediatric): Secondary | ICD-10-CM

## 2023-08-26 DIAGNOSIS — F4010 Social phobia, unspecified: Secondary | ICD-10-CM

## 2023-08-26 DIAGNOSIS — F424 Excoriation (skin-picking) disorder: Secondary | ICD-10-CM | POA: Diagnosis not present

## 2023-08-26 DIAGNOSIS — F410 Panic disorder [episodic paroxysmal anxiety] without agoraphobia: Secondary | ICD-10-CM

## 2023-08-26 DIAGNOSIS — F331 Major depressive disorder, recurrent, moderate: Secondary | ICD-10-CM

## 2023-08-26 NOTE — Progress Notes (Signed)
 Virtual Visit via Video Note  I connected with Kelli Logan on 08/26/23 at  3:00 PM EDT by a video enabled telemedicine application and verified that I am speaking with the correct person using two identifiers.  Location Provider Location : [facility] Patient Location : Home  Participants: Patient , Provider   I discussed the limitations of evaluation and management by telemedicine and the availability of in person appointments. The patient expressed understanding and agreed to proceed.   I discussed the assessment and treatment plan with the patient. The patient was provided an opportunity to ask questions and all were answered. The patient agreed with the plan and demonstrated an understanding of the instructions.   The patient was advised to call back or seek an in-person evaluation if the symptoms worsen or if the condition fails to improve as anticipated.   BH MD OP Progress Note  08/26/2023 3:38 PM Kelli Logan  MRN:  [id number]  Chief Complaint:  Chief Complaint  Patient presents with   Follow-up   Depression   Anxiety   Medication Refill   Discussed the use of AI scribe software for clinical note transcription with the patient, who gave verbal consent to proceed.  History of Present Illness Kelli Logan is a 63 year old Caucasian female, divorced, lives in [place], has a history of PTSD, skin picking disorder, MDD, panic disorder, social anxiety disorder, obstructive sleep apnea on CPAP, history of borderline personality disorder, fibromyalgia, total hip replacement per history, breast cancer was evaluated by telemedicine today.  Significant situational stress related to her living environment includes exposure to secondhand smoke from a neighbor, excessive heat, humidity, and concerns about her neighbor's pets. She feels unable to address these issues with management due to fear of retaliation and expresses frustration because she cannot move due to subsidized  housing constraints. Ongoing stressors contribute to her distress and make it increasingly difficult for her to live in her current situation.  She reports worsening pain in her lower back, which she attributes to pinched nerves. She describes that when the pain starts, she has to sit down and is unable to get things done. She notes that pain and the resulting inability to keep up with household chores further contribute to her distress and sense of being overwhelmed. She feels frustrated because she has difficulty reducing clutter, and she describes challenges letting go of her belongings, although she has donated items in the past.  Ongoing financial stress includes difficulty keeping up with car payments, compounded interest on credit cards, and defaulting on multiple financial obligations. She feels overwhelmed by her financial situation and expresses concern about her inability to resolve these issues, which adds to her overall stress and sense of being sunk.  She reports improvement in her sleep since she increased her mirtazapine  dose by half a pill  and takes hydroxyzine  about an hour to an hour and a half before bed. She now gets approximately 9 hours of sleep, with 5 to 6 hours using her CPAP device before she removes it due to discomfort. She goes to bed late and wakes up around noon. Heat in her apartment affects her sleep quality.  She denies current thoughts of hurting herself or others.  She states she is not currently talking to a therapist and expresses that talking to someone about her problems in the past has only increased her stress. She holds a negative view of therapy, stating it has not been helpful for her previously and only added to  her financial burden.     Visit Diagnosis:    ICD-10-CM   1. PTSD (post-traumatic stress disorder)  F43.10     2. Skin-picking disorder  F42.4     3. MDD (major depressive disorder), recurrent episode, moderate (HCC)  F33.1     4. Panic  disorder  F41.0     5. Social anxiety disorder  F40.10     6. Insomnia due to medical condition  G47.01    PTSD, obstructive sleep apnea on CPAP, pain, situational stressors including current living situation      Past Psychiatric History: I have reviewed past psychiatric history from progress note on 03/10/2018.  Past trials of medications multiple including BuSpar .  Past Medical History:  Past Medical History:  Diagnosis Date   Anxiety    Arthritis    Cancer (HCC)    breast left   Depression    Fibromyalgia    GERD (gastroesophageal reflux disease)    Headache    History of kidney stones    h/o   Hypertension    Hypothyroidism    Sleep apnea    DOES NOT USE CPAP   Thyroid disease     Past Surgical History:  Procedure Laterality Date   APPENDECTOMY     BREAST BIOPSY Left few yrs ago   stereotactic bx in [place], benign   BREAST BIOPSY Left 12/26/2017   Affirm Bx [facility]- X-Clip   BREAST BIOPSY Left 01/14/2018   positive- pt had mastectomy   BREAST IMPLANT EXCHANGE Right 02/16/2021   Procedure: PLACEMENT OF BREAST IMPLANT;  Surgeon: Lowery Estefana RAMAN, DO;  Location: [facility];  Service: Plastics;  Laterality: Right;  1.5 hour   BREAST RECONSTRUCTION WITH PLACEMENT OF TISSUE EXPANDER AND ALLODERM Left 04/08/2018   Procedure: LEFT BREAST IMMEDIATE  RECONSTRUCTION WITH EXPANDER AND FLEX HD;  Surgeon: Lowery Estefana RAMAN, DO;  Location: [facility];  Service: Plastics;  Laterality: Left;   COLONOSCOPY WITH PROPOFOL  N/A 06/26/2018   Procedure: COLONOSCOPY WITH PROPOFOL ;  Surgeon: Janalyn Keene NOVAK, MD;  Location: [facility];  Service: Endoscopy;  Laterality: N/A;   DRUG INDUCED ENDOSCOPY Right 02/27/2022   Procedure: DRUG INDUCED ENDOSCOPY;  Surgeon: Carlie Clark, MD;  Location: [facility];  Service: ENT;  Laterality: Right;   ESOPHAGOGASTRODUODENOSCOPY (EGD) WITH PROPOFOL  N/A 06/26/2018   Procedure: ESOPHAGOGASTRODUODENOSCOPY (EGD) WITH  PROPOFOL ;  Surgeon: Janalyn Keene NOVAK, MD;  Location: [facility];  Service: Endoscopy;  Laterality: N/A;   EYE SURGERY Bilateral    lasik   FOOT BONE EXCISION     IMAGE GUIDED SINUS SURGERY  10/220   LIPOSUCTION Bilateral 02/16/2021   Procedure: LIPOSUCTION OF BILATERAL BREAST;  Surgeon: Lowery Estefana RAMAN, DO;  Location:  SURGERY CENTER;  Service: Plastics;  Laterality: Bilateral;   LIPOSUCTION WITH LIPOFILLING Left 04/30/2019   Procedure: Fat grafting to left breast;  Surgeon: Lowery Estefana RAMAN, DO;  Location:  SURGERY CENTER;  Service: Plastics;  Laterality: Left;  90 min, please   MASTECTOMY Left 04/08/2019   MASTECTOMY W/ SENTINEL NODE BIOPSY Left 04/08/2018   Procedure: MASTECTOMY WITH SENTINEL LYMPH NODE BIOPSY LEFT;  Surgeon: Marolyn Nest, MD;  Location: [facility];  Service: General;  Laterality: Left;   MASTOPEXY Right 08/04/2018   Procedure: MASTOPEXY;  Surgeon: Lowery Estefana RAMAN, DO;  Location: [facility];  Service: Plastics;  Laterality: Right;  TOTAL CASE TIME SHOULD BE 3 HOURS, PLEASE   REMOVAL OF TISSUE EXPANDER AND PLACEMENT OF IMPLANT Left 08/04/2018  Procedure: REMOVAL OF TISSUE EXPANDER AND PLACEMENT OF IMPLANT;  Surgeon: Lowery Estefana RAMAN, DO;  Location: [facility];  Service: Plastics;  Laterality: Left;   SCAR REVISION Left 04/30/2019   Procedure: release of scar contracture to left breast;  Surgeon: Lowery Estefana RAMAN, DO;  Location: [facility];  Service: Plastics;  Laterality: Left;   TOTAL HIP ARTHROPLASTY Left 02/11/2020   Procedure: TOTAL HIP ARTHROPLASTY ANTERIOR APPROACH;  Surgeon: Kathlynn Sharper, MD;  Location: [facility];  Service: Orthopedics;  Laterality: Left;   UMBILICAL HERNIA REPAIR  1983 and 1985    Family Psychiatric History: I reviewed family psychiatric history from progress note on 03/10/2018.  Family History:  Family History  Problem Relation Age of Onset   Hypertension Mother    Lung cancer  Maternal Grandmother    Heart disease Father    Alcohol abuse Father    Breast cancer Neg Hx    Colon cancer Neg Hx     Social History: I have reviewed social history from progress note on 03/10/2018. Social History   Socioeconomic History   Marital status: Divorced    Spouse name: Not on file   Number of children: 1   Years of education: Not on file   Highest education level: High school graduate  Occupational History   Not on file  Tobacco Use   Smoking status: Former    Current packs/day: 0.00    Average packs/day: 1 pack/day for 2.0 years (2.0 ttl pk-yrs)    Types: Cigarettes    Start date: 01/07/2015    Quit date: 01/06/2017    Years since quitting: 6.6   Smokeless tobacco: Never  Vaping Use   Vaping status: Former   Quit date: 11/27/2016  Substance and Sexual Activity   Alcohol use: Yes    Comment: rare beer    Drug use: Not Currently   Sexual activity: Not on file  Other Topics Concern   Not on file  Social History Narrative   Home on disability [pysche problems]; transportation issues; worked in chief financial officer. From [place]; moved after separation. Quit smoking; ocassional alcohol.    Social Drivers of Corporate Investment Banker Strain: High Risk (07/25/2023)   Received from [facility]   Overall Financial Resource Strain (CARDIA)    Difficulty of Paying Living Expenses: Hard  Food Insecurity: Food Insecurity Present (07/25/2023)   Received from [facility]   Hunger Vital Sign    Within the past 12 months, you worried that your food would run out before you got the money to buy more.: Often true    Within the past 12 months, the food you bought just didn't last and you didn't have money to get more.: Often true  Transportation Needs: No Transportation Needs (07/25/2023)   Received from [facility] - Transportation    In the past 12 months, has lack of transportation kept you from medical  appointments or from getting medications?: No    Lack of Transportation (Non-Medical): No  Physical Activity: Inactive (03/10/2018)   Exercise Vital Sign    Days of Exercise per Week: 0 days    Minutes of Exercise per Session: 0 min  Stress: Stress Concern Present (12/31/2019)   [employer] of Occupational [employer] - Occupational Stress Questionnaire    Feeling of Stress : Very much  Social Connections: Unknown (03/10/2018)   Social Connection and Isolation Panel    Frequency of Communication with Friends  and Family: Not on file    Frequency of Social Gatherings with Friends and Family: Not on file    Attends Religious Services: Never    Active Member of Clubs or Organizations: No    Attends Banker Meetings: Never    Marital Status: Divorced    Allergies:  Allergies  Allergen Reactions   Tape Other (See Comments)    Steri strips after breast surgery/Burned skin   Tegaderm and other tape OK    Metabolic Disorder Labs: No results found for: HGBA1C, MPG No results found for: PROLACTIN No results found for: CHOL, TRIG, HDL, CHOLHDL, VLDL, LDLCALC No results found for: TSH  Therapeutic Level Labs: No results found for: LITHIUM No results found for: VALPROATE No results found for: CBMZ  Current Medications: Current Outpatient Medications  Medication Sig Dispense Refill   gabapentin  (NEURONTIN ) 300 MG capsule Take 300 mg by mouth.     albuterol  (VENTOLIN  HFA) 108 (90 Base) MCG/ACT inhaler Inhale 2 puffs into the lungs every 6 (six) hours as needed for wheezing or shortness of breath. 8 g 6   atorvastatin (LIPITOR) 20 MG tablet Take 20 mg by mouth at bedtime.     Biotin (SUPER BIOTIN) 5 MG TABS Take by mouth.     clindamycin (CLINDAGEL) 1 % gel SMARTSIG:1 sparingly Topical Twice Daily     clonazePAM  (KLONOPIN ) 0.5 MG tablet Take 0.5-1 tablets (0.25-0.5 mg total) by mouth daily as needed for anxiety. Please limit use and use only for severe  anxiety 30 tablet 1   Coenzyme Q10 (COQ10) 100 MG CAPS Take 100 mg by mouth every evening.     DULoxetine  (CYMBALTA ) 30 MG capsule Take 1 capsule (30 mg total) by mouth daily. 90 capsule 1   hydrochlorothiazide (HYDRODIURIL) 25 MG tablet Take 25 mg by mouth daily.      hydrocortisone cream 1 % Apply 1 a small amount to skin twice a day for 14 days     Hydrocortisone, Perianal, 1 % CREA Apply topically.     hydrOXYzine  (VISTARIL ) 50 MG capsule Take 1 capsule (50 mg total) by mouth daily as needed. 90 capsule 1   levothyroxine  (SYNTHROID ) 75 MCG tablet Take 75 mcg by mouth every other day. Alternating with 50 mcg every other day-AM     levothyroxine  (SYNTHROID , LEVOTHROID) 50 MCG tablet Take 50 mcg by mouth every other day. Alternating with 75 mcg every other day-AM     losartan  (COZAAR ) 100 MG tablet Take 100 mg by mouth daily. for high blood pressure     methocarbamol  (ROBAXIN ) 500 MG tablet Take 250-500 mg by mouth at bedtime as needed.     metoprolol  (LOPRESSOR ) 50 MG tablet Take 1 tablet (50 mg total) by mouth 2 (two) times daily. (Patient taking differently: Take 50 mg by mouth daily.) 60 tablet 0   mirtazapine  (REMERON ) 15 MG tablet TAKE 1.5 TABLETS (22.5 MG TOTAL) BY MOUTH AT BEDTIME. 135 tablet 0   Multiple Vitamin (MULTIVITAMIN WITH MINERALS) TABS tablet Take 1 tablet by mouth daily.     NONFORMULARY OR COMPOUNDED ITEM 1 Device by Other route as directed. CPAP-with the pressure setting of 10 cm H20 with heated humidity and ResMed AirFit F20 Full Face Mask , size small. 1 each 0   Omega-3 Fatty Acids (FISH OIL PO) Take 1 capsule by mouth daily.     omeprazole (PRILOSEC) 20 MG capsule Take 20 mg by mouth every morning.     No current facility-administered medications for  this visit.     Musculoskeletal: Strength & Muscle Tone: UTA Gait & Station: Seated Patient leans: N/A  Psychiatric Specialty Exam: Review of Systems  Psychiatric/Behavioral:  The patient is nervous/anxious.      Last menstrual period 02/05/2016.There is no height or weight on file to calculate BMI.  General Appearance: Casual  Eye Contact:  Fair  Speech:  Clear and Coherent  Volume:  Normal  Mood:  Anxious  Affect:  Appropriate  Thought Process:  Goal Directed and Descriptions of Associations: Intact  Orientation:  Full (Time, Place, and Person)  Thought Content: Logical   Suicidal Thoughts:  No  Homicidal Thoughts:  No  Memory:  Immediate;   Fair Recent;   Fair Remote;   Fair  Judgement:  Fair  Insight:  Fair  Psychomotor Activity:  Normal  Concentration:  Concentration: Fair and Attention Span: Fair  Recall:  [employer] of Knowledge: Fair  Language: Fair  Akathisia:  No  Handed:  Right  AIMS (if indicated): not done  Assets:  Communication Skills Desire for Improvement Housing Social Support Transportation  ADL's:  Intact  Cognition: WNL  Sleep:  Fair   Screenings: AIMS    Flowsheet Row Video Visit from 06/22/2021 in [facility] Video Visit from 05/22/2021 in [facility]  AIMS Total Score 0 0   GAD-7    Flowsheet Row Office Visit from 11/13/2022 in [facility] Office Visit from 01/17/2022 in [facility] Video Visit from 11/16/2021 in [facility] Video Visit from 09/28/2021 in [facility] Video Visit from 07/26/2021 in [facility]  Total GAD-7 Score 19 14 9 14 4    PHQ2-9    Flowsheet Row Office Visit from 11/13/2022 in [facility] Office Visit from 01/17/2022 in [facility] Video Visit from 09/28/2021 in [facility] Video Visit from 07/26/2021 in [facility] Video Visit from 06/22/2021 in [facility]  PHQ-2 Total Score 1 1 1  0 0  PHQ-9 Total Score -- 5 -- -- --   Flowsheet Row Video Visit from 08/26/2023 in [facility] Video Visit from 03/14/2023 in [facility] Video Visit from 01/07/2023 in [facility]  C-SSRS RISK CATEGORY Moderate Risk Moderate Risk Moderate Risk     Assessment and Plan: Kelli Logan is a 63 year old Caucasian female, currently continues to struggle with situational stressors which does have an impact on mood, sleep, discussed assessment and plan as noted below.  Depression-improving Current living situation,, financial stressors does affect her mood.  She is aware medication changes will not correct these external factors and its effect on her.  She declines referral to psychotherapy. Continue Mirtazapine  22.5 mg at bedtime Continue Cymbalta  30 mg daily Declines referral for psychotherapy  Panic disorder/social anxiety disorder-improving Current dosage of medications beneficial although does have anxiety related to external factors/situational stressors. Declines referral for psychotherapy Continue Hydroxyzine  50 mg daily as needed  PTSD-improving Currently denies any significant trauma related symptoms although she does have situational triggers. Declines referral for psychotherapy.  Insomnia-improving Currently reports sleep is better and is more compliant on CPAP therapy. Continue Mirtazapine  22.5 mg at bedtime Continue CPAP for OSA.  Skin picking disorder-improving  Did not express any concerns at this visit. Continue Clonazepam  0.25-0.5 mg daily as needed. Reviewed [place]MP AWARxE  Follow-up Follow-up in clinic in 3 months or sooner in person.   Collaboration of Care: Collaboration of Care: Patient refused AEB patient  declines a referral for therapy.  Patient/Guardian was advised Release of Information must be obtained prior to any record release in order to collaborate their care with an outside provider. Patient/Guardian was advised if they have not already done so to contact the registration department to sign all necessary forms in order for us  to release information regarding their care.   Consent: Patient/Guardian gives verbal consent for treatment and assignment of benefits for services provided during this visit. Patient/Guardian expressed understanding and agreed to proceed.   This note was generated in part or whole with voice recognition software. Voice recognition is usually quite accurate but there are transcription errors that can and very often do occur. I apologize for any typographical errors that were not detected and corrected.    Kelli Schinke, MD 08/27/2023, 5:36 PM

## 2023-09-06 ENCOUNTER — Other Ambulatory Visit: Payer: Self-pay | Admitting: Psychiatry

## 2023-09-06 DIAGNOSIS — F4310 Post-traumatic stress disorder, unspecified: Secondary | ICD-10-CM

## 2023-09-06 DIAGNOSIS — F410 Panic disorder [episodic paroxysmal anxiety] without agoraphobia: Secondary | ICD-10-CM

## 2023-09-23 DIAGNOSIS — N819 Female genital prolapse, unspecified: Secondary | ICD-10-CM | POA: Insufficient documentation

## 2023-09-23 DIAGNOSIS — Z7251 High risk heterosexual behavior: Secondary | ICD-10-CM | POA: Insufficient documentation

## 2023-10-02 DIAGNOSIS — R87610 Atypical squamous cells of undetermined significance on cytologic smear of cervix (ASC-US): Secondary | ICD-10-CM | POA: Insufficient documentation

## 2023-10-22 DIAGNOSIS — R7401 Elevation of levels of liver transaminase levels: Secondary | ICD-10-CM | POA: Insufficient documentation

## 2023-11-01 ENCOUNTER — Other Ambulatory Visit: Payer: Self-pay | Admitting: Medical Genetics

## 2023-11-01 DIAGNOSIS — Z006 Encounter for examination for normal comparison and control in clinical research program: Secondary | ICD-10-CM

## 2023-11-21 ENCOUNTER — Other Ambulatory Visit: Payer: Self-pay

## 2023-11-21 ENCOUNTER — Encounter: Payer: Self-pay | Admitting: Psychiatry

## 2023-11-21 ENCOUNTER — Other Ambulatory Visit
Admission: RE | Admit: 2023-11-21 | Discharge: 2023-11-21 | Disposition: A | Discharge status: Home / Self Care | Source: Ambulatory Visit | Attending: Psychiatry | Admitting: Psychiatry

## 2023-11-21 ENCOUNTER — Ambulatory Visit: Admitting: Psychiatry

## 2023-11-21 VITALS — BP 134/96 | HR 86 | Ht 66.25 in | Wt 222.4 lb

## 2023-11-21 DIAGNOSIS — F424 Excoriation (skin-picking) disorder: Secondary | ICD-10-CM | POA: Diagnosis not present

## 2023-11-21 DIAGNOSIS — G4701 Insomnia due to medical condition: Secondary | ICD-10-CM

## 2023-11-21 DIAGNOSIS — F410 Panic disorder [episodic paroxysmal anxiety] without agoraphobia: Secondary | ICD-10-CM | POA: Diagnosis not present

## 2023-11-21 DIAGNOSIS — Z79899 Other long term (current) drug therapy: Secondary | ICD-10-CM | POA: Diagnosis present

## 2023-11-21 DIAGNOSIS — F331 Major depressive disorder, recurrent, moderate: Secondary | ICD-10-CM | POA: Insufficient documentation

## 2023-11-21 DIAGNOSIS — F4310 Post-traumatic stress disorder, unspecified: Secondary | ICD-10-CM

## 2023-11-21 DIAGNOSIS — F4010 Social phobia, unspecified: Secondary | ICD-10-CM

## 2023-11-21 DIAGNOSIS — F3342 Major depressive disorder, recurrent, in full remission: Secondary | ICD-10-CM

## 2023-11-21 NOTE — Progress Notes (Unsigned)
 [facility] MD OP Progress Note  11/21/2023 4:37 PM Conception Doebler  MRN:  [id number]  Chief Complaint:  Chief Complaint  Patient presents with   Follow-up   Anxiety   Depression   Medication Refill   Discussed the use of AI scribe software for clinical note transcription with the patient, who gave verbal consent to proceed.  History of Present Illness Kelli Logan is a 63 year old Caucasian female, divorced, lives in [place], has a history of PTSD, skin picking disorder, MDD, panic disorder, social anxiety disorder, obstructive sleep apnea on CPAP, history of borderline personality disorder, fibromyalgia, total hip replacement per history, breast cancer was evaluated in office today for a follow-up appointment.  She describes experiencing significant anxiety related to ongoing financial and legal stressors following her mother's death in 14-Jan-2019. She reports feeling terrified about potential legal consequences involving her mother's estate, including concerns about money withdrawn from her mother's accounts and the possibility of being held responsible for estate-related debts. She reports persistent worry about receiving official notices and fears regarding her own safety and well-being, including concerns about her cat if she were to be incarcerated, which contribute to her current anxiety.  Sleep disruption affects her, as she wakes after approximately 5 hours and has difficulty returning to sleep due to anxiety. She describes feeling tired upon waking and notes that physical discomfort from hip pain and increased weight further interferes with her ability to rest. She always sleeps on her left side due to hip pain and has recently developed pain in her knee and ankle, which she relates to weight gain.  She reports a history of skin picking, though she notes recent improvement, with current symptoms limited to pink blotches following a recent knee injection.  She continues to  experience struggles with eating, particularly increased appetite and munchies.  This happened since she used edible cannabis.  Since she stopped using it appetite has been stable.  She discontinued gabapentin , stating it did not relieve her pain and numbness symptoms. She notes she has been out of Klonopin  and is not currently taking it.  She denies suicidal ideation, homicidal ideation, and psychosis.    Visit Diagnosis:    ICD-10-CM   1. PTSD (post-traumatic stress disorder)  F43.10     2. Skin-picking disorder  F42.4     3. Recurrent major depressive disorder, in full remission  F33.42     4. Panic disorder  F41.0     5. Social anxiety disorder  F40.10     6. Insomnia due to medical condition  G47.01    PTSD, obstructive sleep apnea on CPAP, pain, situational stressors including current living situation    7. High risk medication use  Z79.899 Urine drugs of abuse scrn w alc, routine (Ref Lab)    CANCELED: Urine drugs of abuse scrn w alc, routine (Ref Lab)      Past Psychiatric History: I have reviewed past psychiatric history from progress note on 03/10/2018.  Past trials of medications multiple including BuSpar   Past Medical History:  Past Medical History:  Diagnosis Date   Anxiety    Arthritis    Cancer (HCC)    breast left   Depression    Fibromyalgia    GERD (gastroesophageal reflux disease)    Headache    History of kidney stones    h/o   Hypertension    Hypothyroidism    Sleep apnea    DOES NOT USE CPAP   Thyroid disease  Past Surgical History:  Procedure Laterality Date   APPENDECTOMY     BREAST BIOPSY Left few yrs ago   stereotactic bx in [place], benign   BREAST BIOPSY Left 12/26/2017   Affirm Bx [facility]- X-Clip   BREAST BIOPSY Left 01/14/2018   positive- pt had mastectomy   BREAST IMPLANT EXCHANGE Right 02/16/2021   Procedure: PLACEMENT OF BREAST IMPLANT;  Surgeon: Lowery Estefana RAMAN, DO;  Location: [facility];  Service: Plastics;   Laterality: Right;  1.5 hour   BREAST RECONSTRUCTION WITH PLACEMENT OF TISSUE EXPANDER AND ALLODERM Left 04/08/2018   Procedure: LEFT BREAST IMMEDIATE  RECONSTRUCTION WITH EXPANDER AND FLEX HD;  Surgeon: Lowery Estefana RAMAN, DO;  Location: [facility];  Service: Plastics;  Laterality: Left;   COLONOSCOPY WITH PROPOFOL  N/A 06/26/2018   Procedure: COLONOSCOPY WITH PROPOFOL ;  Surgeon: Janalyn Keene NOVAK, MD;  Location: [facility];  Service: Endoscopy;  Laterality: N/A;   DRUG INDUCED ENDOSCOPY Right 02/27/2022   Procedure: DRUG INDUCED ENDOSCOPY;  Surgeon: Carlie Clark, MD;  Location: [facility];  Service: ENT;  Laterality: Right;   ESOPHAGOGASTRODUODENOSCOPY (EGD) WITH PROPOFOL  N/A 06/26/2018   Procedure: ESOPHAGOGASTRODUODENOSCOPY (EGD) WITH PROPOFOL ;  Surgeon: Janalyn Keene NOVAK, MD;  Location: [facility];  Service: Endoscopy;  Laterality: N/A;   EYE SURGERY Bilateral    lasik   FOOT BONE EXCISION     IMAGE GUIDED SINUS SURGERY  10/220   LIPOSUCTION Bilateral 02/16/2021   Procedure: LIPOSUCTION OF BILATERAL BREAST;  Surgeon: Lowery Estefana RAMAN, DO;  Location: [facility];  Service: Plastics;  Laterality: Bilateral;   LIPOSUCTION WITH LIPOFILLING Left 04/30/2019   Procedure: Fat grafting to left breast;  Surgeon: Lowery Estefana RAMAN, DO;  Location: [facility];  Service: Plastics;  Laterality: Left;  90 min, please   MASTECTOMY Left 04/08/2019   MASTECTOMY W/ SENTINEL NODE BIOPSY Left 04/08/2018   Procedure: MASTECTOMY WITH SENTINEL LYMPH NODE BIOPSY LEFT;  Surgeon: Marolyn Nest, MD;  Location: [facility];  Service: General;  Laterality: Left;   MASTOPEXY Right 08/04/2018   Procedure: MASTOPEXY;  Surgeon: Lowery Estefana RAMAN, DO;  Location: [facility];  Service: Plastics;  Laterality: Right;  TOTAL CASE TIME SHOULD BE 3 HOURS, PLEASE   REMOVAL OF TISSUE EXPANDER AND PLACEMENT OF IMPLANT Left 08/04/2018   Procedure: REMOVAL OF TISSUE  EXPANDER AND PLACEMENT OF IMPLANT;  Surgeon: Lowery Estefana RAMAN, DO;  Location: [facility];  Service: Plastics;  Laterality: Left;   SCAR REVISION Left 04/30/2019   Procedure: release of scar contracture to left breast;  Surgeon: Lowery Estefana RAMAN, DO;  Location: [facility];  Service: Plastics;  Laterality: Left;   TOTAL HIP ARTHROPLASTY Left 02/11/2020   Procedure: TOTAL HIP ARTHROPLASTY ANTERIOR APPROACH;  Surgeon: Kathlynn Sharper, MD;  Location: [facility];  Service: Orthopedics;  Laterality: Left;   UMBILICAL HERNIA REPAIR  1983 and 1985    Family Psychiatric History: I have reviewed family psychiatric history from progress note on 03/10/2018.  Family History:  Family History  Problem Relation Age of Onset   Hypertension Mother    Lung cancer Maternal Grandmother    Heart disease Father    Alcohol abuse Father    Breast cancer Neg Hx    Colon cancer Neg Hx     Social History: I have reviewed social history from progress note on 03/10/2018. Social History   Socioeconomic History   Marital status: Divorced    Spouse name: Not on file   Number  of children: 1   Years of education: Not on file   Highest education level: High school graduate  Occupational History   Not on file  Tobacco Use   Smoking status: Former    Current packs/day: 0.00    Average packs/day: 1 pack/day for 2.0 years (2.0 ttl pk-yrs)    Types: Cigarettes    Start date: 01/07/2015    Quit date: 01/06/2017    Years since quitting: 6.8   Smokeless tobacco: Never  Vaping Use   Vaping status: Former   Quit date: 11/27/2016  Substance and Sexual Activity   Alcohol use: Yes    Comment: rare beer    Drug use: Not Currently   Sexual activity: Not on file  Other Topics Concern   Not on file  Social History Narrative   Home on disability [pysche problems]; transportation issues; worked in chief financial officer. From [place]; moved after separation. Quit smoking; ocassional alcohol.    Social Drivers of  Corporate Investment Banker Strain: High Risk (11/11/2023)   Received from [facility]   Overall Financial Resource Strain (CARDIA)    Difficulty of Paying Living Expenses: Hard  Food Insecurity: Food Insecurity Present (11/11/2023)   Received from [facility]   Hunger Vital Sign    Within the past 12 months, you worried that your food would run out before you got the money to buy more.: Sometimes true    Within the past 12 months, the food you bought just didn't last and you didn't have money to get more.: Sometimes true  Transportation Needs: No Transportation Needs (11/11/2023)   Received from [facility] - Transportation    In the past 12 months, has lack of transportation kept you from medical appointments or from getting medications?: No    Lack of Transportation (Non-Medical): No  Physical Activity: Inactive (03/10/2018)   Exercise Vital Sign    Days of Exercise per Week: 0 days    Minutes of Exercise per Session: 0 min  Stress: Stress Concern Present (12/31/2019)   [employer] of Occupational [employer] - Occupational Stress Questionnaire    Feeling of Stress : Very much  Social Connections: Unknown (03/10/2018)   Social Connection and Isolation Panel    Frequency of Communication with Friends and Family: Not on file    Frequency of Social Gatherings with Friends and Family: Not on file    Attends Religious Services: Never    Active Member of Clubs or Organizations: No    Attends Banker Meetings: Never    Marital Status: Divorced    Allergies:  Allergies  Allergen Reactions   Tape Other (See Comments)    Steri strips after breast surgery/Burned skin   Tegaderm and other tape OK    Metabolic Disorder Labs: No results found for: HGBA1C, MPG No results found for: PROLACTIN No results found for: CHOL, TRIG, HDL, CHOLHDL, VLDL, LDLCALC No results found for: TSH  Therapeutic  Level Labs: No results found for: LITHIUM No results found for: VALPROATE No results found for: CBMZ  Current Medications: Current Outpatient Medications  Medication Sig Dispense Refill   albuterol  (VENTOLIN  HFA) 108 (90 Base) MCG/ACT inhaler Inhale 2 puffs into the lungs every 6 (six) hours as needed for wheezing or shortness of breath. 8 g 6   atorvastatin (LIPITOR) 20 MG tablet Take 20 mg by mouth at bedtime.     Biotin (SUPER BIOTIN) 5 MG TABS Take by  mouth.     clindamycin (CLINDAGEL) 1 % gel SMARTSIG:1 sparingly Topical Twice Daily     clonazePAM  (KLONOPIN ) 0.5 MG tablet Take 0.5-1 tablets (0.25-0.5 mg total) by mouth daily as needed for anxiety. Please limit use and use only for severe anxiety 30 tablet 1   DULoxetine  (CYMBALTA ) 30 MG capsule Take 1 capsule (30 mg total) by mouth daily. 90 capsule 1   hydrochlorothiazide (HYDRODIURIL) 25 MG tablet Take 25 mg by mouth daily.      hydrocortisone cream 1 % Apply 1 a small amount to skin twice a day for 14 days     Hydrocortisone, Perianal, 1 % CREA Apply topically.     hydrOXYzine  (VISTARIL ) 50 MG capsule TAKE 1 CAPSULE (50 MG TOTAL) BY MOUTH DAILY AS NEEDED 90 capsule 1   levothyroxine  (SYNTHROID ) 75 MCG tablet Take 75 mcg by mouth every other day. Alternating with 50 mcg every other day-AM     levothyroxine  (SYNTHROID , LEVOTHROID) 50 MCG tablet Take 50 mcg by mouth every other day. Alternating with 75 mcg every other day-AM     losartan  (COZAAR ) 100 MG tablet Take 100 mg by mouth daily. for high blood pressure     metoprolol  (LOPRESSOR ) 50 MG tablet Take 1 tablet (50 mg total) by mouth 2 (two) times daily. 60 tablet 0   mirtazapine  (REMERON ) 15 MG tablet TAKE 1.5 TABLETS (22.5 MG TOTAL) BY MOUTH AT BEDTIME. 135 tablet 0   Multiple Vitamin (MULTIVITAMIN WITH MINERALS) TABS tablet Take 1 tablet by mouth daily.     NONFORMULARY OR COMPOUNDED ITEM 1 Device by Other route as directed. CPAP-with the pressure setting of 10 cm H20 with  heated humidity and ResMed AirFit F20 Full Face Mask , size small. 1 each 0   Omega-3 Fatty Acids (FISH OIL PO) Take 1 capsule by mouth daily.     omeprazole (PRILOSEC) 20 MG capsule Take 20 mg by mouth every morning.     Coenzyme Q10 (COQ10) 100 MG CAPS Take 100 mg by mouth every evening. (Patient not taking: Reported on 11/21/2023)     methocarbamol  (ROBAXIN ) 500 MG tablet Take 250-500 mg by mouth at bedtime as needed. (Patient not taking: Reported on 11/21/2023)     No current facility-administered medications for this visit.     Musculoskeletal: Strength & Muscle Tone: within normal limits Gait & Station: normal Patient leans: N/A  Psychiatric Specialty Exam: Review of Systems  Psychiatric/Behavioral:  Positive for sleep disturbance. The patient is nervous/anxious.     Blood pressure (!) 134/96, pulse 86, height 5' 6.25 (1.683 m), weight 222 lb 6.4 oz (100.9 kg), last menstrual period 02/05/2016, SpO2 100%.Body mass index is 35.63 kg/m.  General Appearance: Casual  Eye Contact:  Fair  Speech:  Clear and Coherent  Volume:  Normal  Mood:  Anxious  Affect:  Congruent  Thought Process:  Goal Directed and Descriptions of Associations: Intact  Orientation:  Full (Time, Place, and Person)  Thought Content: Logical   Suicidal Thoughts:  No  Homicidal Thoughts:  No  Memory:  Immediate;   Fair Recent;   Fair Remote;   Fair  Judgement:  Fair  Insight:  Fair  Psychomotor Activity:  Normal  Concentration:  Concentration: Fair and Attention Span: Fair  Recall:  [employer] of Knowledge: Fair  Language: Fair  Akathisia:  denies  Handed:  Right  AIMS (if indicated): done  Assets:  Communication Skills Desire for Improvement Housing Social Support Transportation  ADL's:  Intact  Cognition: WNL  Sleep:  varies   Screenings: AIMS    Flowsheet Row Video Visit from 06/22/2021 in [facility] Video Visit from 05/22/2021 in [facility]  AIMS Total Score 0 0   GAD-7    Flowsheet Row Office Visit from 11/21/2023 in [facility] Office Visit from 11/13/2022 in [facility] Office Visit from 01/17/2022 in [facility] Video Visit from 11/16/2021 in [facility] Video Visit from 09/28/2021 in [facility]  Total GAD-7 Score 19 19 14 9 14    PHQ2-9    Flowsheet Row Office Visit from 11/21/2023 in [facility] Office Visit from 11/13/2022 in [facility] Office Visit from 01/17/2022 in [facility] Video Visit from 09/28/2021 in [facility] Video Visit from 07/26/2021 in [facility]  PHQ-2 Total Score 0 1 1 1  0  PHQ-9 Total Score -- -- 5 -- --   Flowsheet Row Office Visit from 11/21/2023 in [facility] Video Visit from 08/26/2023 in [facility] Video Visit from 03/14/2023 in [facility]  C-SSRS RISK CATEGORY No Risk Moderate Risk Moderate Risk     Assessment and Plan: Kelli Logan is a 63 year old Caucasian female, presented for a follow-up appointment, discussed assessment and plan as noted below.  1. PTSD (post-traumatic stress disorder)-unstable Currently reports worsening trauma-related symptoms intrusive memories due to recent situational stressors. Patient declines referral for psychotherapy. Continue Mirtazapine  22.5 mg at bedtime  2. Skin-picking disorder-improving Recent improvement although with episodic flareups. Declines referral for CBT  3. MDD (major depressive  disorder), recurrent episode, moderate (HCC) in full remission Currently denies any significant depression symptoms Continue Cymbalta  30 mg daily Continue Mirtazapine  22.5 mg at bedtime  4. Panic disorder-unstable Recent worsening of anxiety symptoms due to multiple situational stressors. Declines referral for CBT Plan to start Lyrica once urine drug screen completed Continue Cymbalta  and Mirtazapine  as prescribed Continue Hydroxyzine  50 mg daily as needed Discontinue Clonazepam .  5. Social anxiety disorder-improving Currently some improvement with social anxiety. Declines referral for CBT  6. Insomnia due to medical condition-unstable Sleep problems mostly due to anxiety. Encouraged to continue CPAP Will consider starting Lyrica to manage anxiety pain likely contributing to sleep issues as well  7. High risk medication use Will order urine drug screen, patient to go to [facility] lab.  Once urine drug screen is reviewed will consider initiating Lyrica.  Follow-up Follow-up in clinic in 4 weeks or sooner if needed.  Collaboration of Care: Collaboration of Care: Patient refused AEB patient declines referral for CBT  Patient/Guardian was advised Release of Information must be obtained prior to any record release in order to collaborate their care with an outside provider. Patient/Guardian was advised if they have not already done so to contact the registration department to sign all necessary forms in order for us  to release information regarding their care.   Consent: Patient/Guardian gives verbal consent for treatment and assignment of benefits for services provided during this visit. Patient/Guardian expressed understanding and agreed to proceed.  This note was generated in part or whole with voice recognition software. Voice recognition is usually quite accurate but there are transcription errors that can and very often do  occur. I apologize for any typographical errors that were not  detected and corrected.     Amador Braddy, MD 11/22/2023, 8:46 AM

## 2023-11-21 NOTE — Patient Instructions (Signed)
 Pregabalin  Capsules What is this medication? PREGABALIN  (pre GAB a lin) treats nerve pain. It may also be used to prevent and control seizures in people with epilepsy. It works by calming overactive nerves in your body. This medicine may be used for other purposes; ask your health care provider or pharmacist if you have questions. COMMON BRAND NAME(S): Lyrica  What should I tell my care team before I take this medication? They need to know if you have any of these conditions: Have or have had depression Heart failure Kidney disease Lung or breathing disease, such as asthma or COPD Substance use disorder Suicidal thoughts, plans, or attempt An unusual or allergic reaction to pregabalin , other medications, foods, dyes, or preservatives Pregnant or trying to get pregnant Breastfeeding How should I use this medication? Take this medication by mouth with water. Take it as directed on the prescription label at the same time every day. You can take it with or without food. If it upsets your stomach, take it with food. Keep taking it unless your care team tells you to stop. A special MedGuide will be given to you by the pharmacist with each prescription and refill. Be sure to read this information carefully each time. Talk to your care team about the use of this medication in children. While it may be prescribed for children as young as 1 month for selected conditions, precautions do apply. Overdosage: If you think you have taken too much of this medicine contact a poison control center or emergency room at once. NOTE: This medicine is only for you. Do not share this medicine with others. What if I miss a dose? If you miss a dose, take it as soon as you can. If it is almost time for your next dose, take only that dose. Do not take double or extra doses. What may interact with this medication? This medication may interact with the following: Alcohol Antihistamines for allergy, cough, and  cold Certain medications for anxiety or sleep Certain medications for blood pressure, heart disease Certain medications for depression like amitriptyline, fluoxetine, sertraline Certain medications for diabetes, like pioglitazone, rosiglitazone Certain medications for seizures like phenobarbital, primidone General anesthetics like halothane, isoflurane, methoxyflurane, propofol  Medications that relax muscles for surgery Opioid medications for pain Phenothiazines like chlorpromazine, mesoridazine, prochlorperazine, thioridazine This list may not describe all possible interactions. Give your health care provider a list of all the medicines, herbs, non-prescription drugs, or dietary supplements you use. Also tell them if you smoke, drink alcohol, or use illegal drugs. Some items may interact with your medicine. What should I watch for while using this medication? Visit your care team for regular checks on your progress. Tell your care team if your symptoms do not start to get better or if they get worse. Do not suddenly stop taking this medication. You may develop a severe reaction. Your care team will tell you how much medication to take. If your care team wants you to stop the medication, the dose may be slowly lowered over time to avoid any side effects. This medication may affect your coordination, reaction time, or judgment. Do not drive or operate machinery until you know how this medication affects you. Sit up or stand slowly to reduce the risk of dizzy or fainting spells. Drinking alcohol with this medication can increase the risk of these side effects. Taking this medication with other CNS depressants can make you too sleepy. This can make it hard to breathe and stay awake. In some cases, it  can cause coma and death. CNS depressants include opioids, benzodiazepines, muscle relaxants, medications for sleep, and alcohol. Talk to your care team about all the medications, vitamins, and supplements  you take. They can tell you what is safe to take together. Call emergency services right away if you have slow or shallow breathing, feel dizzy or confused, or have trouble staying awake. This medication can cause new or worsening depression and increase the risk of suicidal thoughts and actions in a small number of people. This can happen while you are taking this medication or after stopping it. Talk to your care team right away if you have changes in mood or behavior or thoughts of self-harm or suicide. They can help you. If you take this medication for seizures, wear a medical ID bracelet or chain. Carry a card that describes your condition. List the medications and doses you take on the card. Talk to your care team if you may be pregnant. There are benefits and risks to taking medications during pregnancy. Your care team can help you find the option that works for you. What side effects may I notice from receiving this medication? Side effects that you should report to your care team as soon as possible: Allergic reactions or angioedema--skin rash, itching or hives, swelling of the face, eyes, lips, tongue, arms, or legs, trouble swallowing or breathing Change in vision Muscle injury--unusual weakness or fatigue, muscle pain, dark yellow or brown urine, decrease in amount of urine Thoughts of suicide or self-harm, worsening mood, feelings of depression Trouble breathing Unusual bruising or bleeding Unusual changes in mood or behavior in children after use such as trouble concentrating, hostility, or restlessness Side effects that usually do not require medical attention (report these to your care team if they continue or are bothersome): Blurry vision Dizziness Drowsiness Dry mouth Swelling of the ankles, hands, or feet Weight gain This list may not describe all possible side effects. Call your doctor for medical advice about side effects. You may report side effects to FDA at  1-800-FDA-1088. Where should I keep my medication? Keep out of the reach of children and pets. This medication can be abused. Keep it in a safe place to protect it from theft. Do not share it with anyone. It is only for you. Selling or giving away this medication is dangerous and against the law. Store at [employer] [facility] (77 degrees F). Get rid of any unused medication after the expiration date. This medication may cause harm and death if it is taken by other adults, children, or pets. It is important to get rid of the medication as soon as you no longer need it, or it is expired. You can do this in two ways: Take the medication to a medication take-back program. Check with your pharmacy or law enforcement to find a location. If you cannot return the medication, check the label or package insert to see if the medication should be thrown out in the garbage or flushed down the toilet. If you are not sure, ask your care team. If it is safe to put it in the trash, take the medication out of the container. Mix the medication with cat litter, dirt, coffee grounds, or other unwanted substance. Seal the mixture in a bag or container. Put it in the trash. NOTE: This sheet is a summary. It may not cover all possible information. If you have questions about this medicine, talk to your doctor, pharmacist, or health care provider.  2025 [employer]  Standard (2023-05-13 00:00:00)

## 2023-11-25 ENCOUNTER — Ambulatory Visit
Admission: RE | Admit: 2023-11-25 | Discharge: 2023-11-25 | Disposition: A | Discharge status: Home / Self Care | Source: Ambulatory Visit | Attending: Pain Medicine | Admitting: Pain Medicine

## 2023-11-25 ENCOUNTER — Ambulatory Visit (HOSPITAL_BASED_OUTPATIENT_CLINIC_OR_DEPARTMENT_OTHER): Admitting: Pain Medicine

## 2023-11-25 ENCOUNTER — Other Ambulatory Visit
Admission: RE | Admit: 2023-11-25 | Discharge: 2023-11-25 | Disposition: A | Source: Ambulatory Visit | Attending: Pain Medicine | Admitting: Pain Medicine

## 2023-11-25 ENCOUNTER — Encounter: Payer: Self-pay | Admitting: Pain Medicine

## 2023-11-25 VITALS — BP 160/115 | HR 93 | Temp 97.2°F | Resp 16 | Ht 66.0 in | Wt 220.0 lb

## 2023-11-25 DIAGNOSIS — M79604 Pain in right leg: Secondary | ICD-10-CM

## 2023-11-25 DIAGNOSIS — M542 Cervicalgia: Secondary | ICD-10-CM | POA: Insufficient documentation

## 2023-11-25 DIAGNOSIS — E559 Vitamin D deficiency, unspecified: Secondary | ICD-10-CM | POA: Diagnosis not present

## 2023-11-25 DIAGNOSIS — M25571 Pain in right ankle and joints of right foot: Secondary | ICD-10-CM

## 2023-11-25 DIAGNOSIS — Z96642 Presence of left artificial hip joint: Secondary | ICD-10-CM | POA: Insufficient documentation

## 2023-11-25 DIAGNOSIS — Z79899 Other long term (current) drug therapy: Secondary | ICD-10-CM | POA: Insufficient documentation

## 2023-11-25 DIAGNOSIS — M25561 Pain in right knee: Secondary | ICD-10-CM | POA: Insufficient documentation

## 2023-11-25 DIAGNOSIS — M170 Bilateral primary osteoarthritis of knee: Secondary | ICD-10-CM | POA: Insufficient documentation

## 2023-11-25 DIAGNOSIS — M5459 Other low back pain: Secondary | ICD-10-CM | POA: Insufficient documentation

## 2023-11-25 DIAGNOSIS — Z789 Other specified health status: Secondary | ICD-10-CM | POA: Insufficient documentation

## 2023-11-25 DIAGNOSIS — M25562 Pain in left knee: Secondary | ICD-10-CM

## 2023-11-25 DIAGNOSIS — M1611 Unilateral primary osteoarthritis, right hip: Secondary | ICD-10-CM | POA: Diagnosis not present

## 2023-11-25 DIAGNOSIS — M069 Rheumatoid arthritis, unspecified: Secondary | ICD-10-CM

## 2023-11-25 DIAGNOSIS — G4486 Cervicogenic headache: Secondary | ICD-10-CM | POA: Insufficient documentation

## 2023-11-25 DIAGNOSIS — R937 Abnormal findings on diagnostic imaging of other parts of musculoskeletal system: Secondary | ICD-10-CM | POA: Insufficient documentation

## 2023-11-25 DIAGNOSIS — M79605 Pain in left leg: Secondary | ICD-10-CM

## 2023-11-25 DIAGNOSIS — G894 Chronic pain syndrome: Secondary | ICD-10-CM | POA: Insufficient documentation

## 2023-11-25 DIAGNOSIS — M47816 Spondylosis without myelopathy or radiculopathy, lumbar region: Secondary | ICD-10-CM

## 2023-11-25 DIAGNOSIS — M2550 Pain in unspecified joint: Secondary | ICD-10-CM

## 2023-11-25 DIAGNOSIS — M899 Disorder of bone, unspecified: Secondary | ICD-10-CM | POA: Insufficient documentation

## 2023-11-25 DIAGNOSIS — M25552 Pain in left hip: Secondary | ICD-10-CM

## 2023-11-25 DIAGNOSIS — G8929 Other chronic pain: Secondary | ICD-10-CM | POA: Insufficient documentation

## 2023-11-25 DIAGNOSIS — M545 Low back pain, unspecified: Secondary | ICD-10-CM | POA: Insufficient documentation

## 2023-11-25 DIAGNOSIS — M25551 Pain in right hip: Secondary | ICD-10-CM

## 2023-11-25 LAB — COMPREHENSIVE METABOLIC PANEL WITH GFR
ALT: 34 U/L (ref 0–44)
AST: 29 U/L (ref 15–41)
Albumin: 4.5 g/dL (ref 3.5–5.0)
Alkaline Phosphatase: 98 U/L (ref 38–126)
Anion gap: 11 (ref 5–15)
BUN: 8 mg/dL (ref 8–23)
CO2: 29 mmol/L (ref 22–32)
Calcium: 10.1 mg/dL (ref 8.9–10.3)
Chloride: 94 mmol/L — ABNORMAL LOW (ref 98–111)
Creatinine, Ser: 0.88 mg/dL (ref 0.44–1.00)
GFR, Estimated: >60 mL/min (ref >60)
Glucose, Bld: 113 mg/dL — ABNORMAL HIGH (ref 70–99)
Potassium: 3.6 mmol/L (ref 3.5–5.1)
Sodium: 134 mmol/L — ABNORMAL LOW (ref 135–145)
Total Bilirubin: 0.7 mg/dL (ref 0.0–1.2)
Total Protein: 7.3 g/dL (ref 6.5–8.1)

## 2023-11-25 LAB — VITAMIN B12: Vitamin B-12: 532 pg/mL (ref 180–914)

## 2023-11-25 LAB — C-REACTIVE PROTEIN: CRP: 0.6 mg/dL (ref <1.0)

## 2023-11-25 LAB — MAGNESIUM: Magnesium: 1.8 mg/dL (ref 1.7–2.4)

## 2023-11-25 LAB — SEDIMENTATION RATE: Sed Rate: 8 mm/h (ref 0–30)
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
## 2023-11-25 NOTE — Progress Notes (Signed)
 Safety precautions to be maintained throughout the outpatient stay will include: orient to surroundings, keep bed in low position, maintain call bell within reach at all times, provide assistance with transfer out of bed and ambulation.

## 2023-11-25 NOTE — Patient Instructions (Signed)

## 2023-11-26 LAB — URINE DRUGS OF ABUSE SCREEN W ALC, ROUTINE (REF LAB)
Amphetamines, Urine: NEGATIVE ng/mL
Barbiturate, Ur: NEGATIVE ng/mL
Benzodiazepine Quant, Ur: NEGATIVE ng/mL
Cocaine (Metab.): NEGATIVE ng/mL
Creatinine, Urine: 57.3 mg/dL (ref 20.0–300.0)
Ethanol U, Quan: NEGATIVE %
Methadone Screen, Urine: NEGATIVE ng/mL
Nitrite Urine, Quantitative: NEGATIVE ug/mL
OPIATE SCREEN URINE: NEGATIVE ng/mL
Phencyclidine, Ur: NEGATIVE ng/mL
Propoxyphene, Urine: NEGATIVE ng/mL
pH, Urine: 5.9 (ref 4.5–8.9)

## 2023-11-26 LAB — PANEL 799049
CARBOXY THC GC/MS CONF: 48 ng/mL
Cannabinoid GC/MS, Ur: POSITIVE — AB

## 2023-11-27 ENCOUNTER — Ambulatory Visit: Admitting: Psychiatry

## 2023-11-27 LAB — RHEUMATOID FACTOR: Rheumatoid fact SerPl-aCnc: <10 [IU]/mL (ref <14.0)

## 2023-11-28 ENCOUNTER — Ambulatory Visit: Payer: Self-pay | Admitting: Psychiatry

## 2023-11-28 LAB — COMPLIANCE DRUG ANALYSIS, UR

## 2023-12-02 LAB — 25-HYDROXY VITAMIN D LCMS D2+D3
25-Hydroxy, Vitamin D-2: <1 ng/mL
25-Hydroxy, Vitamin D-3: 61 ng/mL
25-Hydroxy, Vitamin D: 62 ng/mL

## 2023-12-05 ENCOUNTER — Other Ambulatory Visit: Payer: Self-pay | Admitting: Psychiatry

## 2023-12-05 DIAGNOSIS — F410 Panic disorder [episodic paroxysmal anxiety] without agoraphobia: Secondary | ICD-10-CM

## 2023-12-20 ENCOUNTER — Telehealth: Payer: Self-pay | Admitting: Medical Genetics

## 2023-12-20 DIAGNOSIS — Z006 Encounter for examination for normal comparison and control in clinical research program: Secondary | ICD-10-CM

## 2023-12-20 LAB — GENECONNECT MOLECULAR SCREEN

## 2023-12-23 ENCOUNTER — Other Ambulatory Visit: Payer: Self-pay | Admitting: Nurse Practitioner

## 2023-12-23 DIAGNOSIS — Z853 Personal history of malignant neoplasm of breast: Secondary | ICD-10-CM

## 2023-12-23 DIAGNOSIS — Z1231 Encounter for screening mammogram for malignant neoplasm of breast: Secondary | ICD-10-CM

## 2023-12-23 DIAGNOSIS — N6320 Unspecified lump in the left breast, unspecified quadrant: Secondary | ICD-10-CM

## 2023-12-24 NOTE — Telephone Encounter (Signed)
 [facility]  12/24/2023  2:06 PM  Confirmed I was speaking with Kelli Logan [phone] by using name and DOB. Informed participant the reason for this call is to follow-up on a recent sample the participant provided at one of the [facility] lab locations. Informed participant the test was not able to be completed with this sample and apologized for the inconvenience. Participant was requested to provide a new sample at one of our participating labs at no cost so that participant can continue participation and receive test results. Informed participant they do not need to be fasting and if there are other samples that need to be drawn, they can be done at the same visit. Participant has not had a blood transfusion or blood product in the last 30 days. Participant agreed to provide another sample. Participant was provided the [employer] program website to learn why this may have happened. Participant was thanked for their time and continued support of the above study.    Jordyn Pennstrom, BS Secretary  [facility] Clinical Research Specialist II Direct Dial: [phone]  Fax: [phone]

## 2023-12-25 ENCOUNTER — Telehealth: Admitting: Psychiatry

## 2023-12-25 ENCOUNTER — Ambulatory Visit: Attending: Pain Medicine | Admitting: Pain Medicine

## 2023-12-25 ENCOUNTER — Ambulatory Visit

## 2023-12-25 VITALS — BP 162/104 | HR 85 | Temp 98.1°F | Resp 16 | Ht 66.75 in

## 2023-12-25 DIAGNOSIS — M4802 Spinal stenosis, cervical region: Secondary | ICD-10-CM | POA: Diagnosis not present

## 2023-12-25 DIAGNOSIS — M1611 Unilateral primary osteoarthritis, right hip: Secondary | ICD-10-CM | POA: Diagnosis not present

## 2023-12-25 DIAGNOSIS — M79604 Pain in right leg: Secondary | ICD-10-CM | POA: Diagnosis not present

## 2023-12-25 DIAGNOSIS — M79605 Pain in left leg: Secondary | ICD-10-CM | POA: Diagnosis not present

## 2023-12-25 DIAGNOSIS — M2428 Disorder of ligament, vertebrae: Secondary | ICD-10-CM | POA: Insufficient documentation

## 2023-12-25 DIAGNOSIS — M5459 Other low back pain: Secondary | ICD-10-CM

## 2023-12-25 DIAGNOSIS — R892 Abnormal level of other drugs, medicaments and biological substances in specimens from other organs, systems and tissues: Secondary | ICD-10-CM | POA: Insufficient documentation

## 2023-12-25 DIAGNOSIS — M25552 Pain in left hip: Secondary | ICD-10-CM | POA: Diagnosis not present

## 2023-12-25 DIAGNOSIS — M47816 Spondylosis without myelopathy or radiculopathy, lumbar region: Secondary | ICD-10-CM | POA: Insufficient documentation

## 2023-12-25 DIAGNOSIS — M4186 Other forms of scoliosis, lumbar region: Secondary | ICD-10-CM | POA: Insufficient documentation

## 2023-12-25 DIAGNOSIS — G8929 Other chronic pain: Secondary | ICD-10-CM

## 2023-12-25 DIAGNOSIS — M170 Bilateral primary osteoarthritis of knee: Secondary | ICD-10-CM | POA: Insufficient documentation

## 2023-12-25 DIAGNOSIS — F1290 Cannabis use, unspecified, uncomplicated: Secondary | ICD-10-CM | POA: Insufficient documentation

## 2023-12-25 DIAGNOSIS — M47812 Spondylosis without myelopathy or radiculopathy, cervical region: Secondary | ICD-10-CM | POA: Diagnosis not present

## 2023-12-25 DIAGNOSIS — G4486 Cervicogenic headache: Secondary | ICD-10-CM

## 2023-12-25 DIAGNOSIS — M4310 Spondylolisthesis, site unspecified: Secondary | ICD-10-CM | POA: Insufficient documentation

## 2023-12-25 DIAGNOSIS — M542 Cervicalgia: Secondary | ICD-10-CM | POA: Insufficient documentation

## 2023-12-25 DIAGNOSIS — M76892 Other specified enthesopathies of left lower limb, excluding foot: Secondary | ICD-10-CM | POA: Insufficient documentation

## 2023-12-25 DIAGNOSIS — M76891 Other specified enthesopathies of right lower limb, excluding foot: Secondary | ICD-10-CM | POA: Diagnosis not present

## 2023-12-25 DIAGNOSIS — Z96642 Presence of left artificial hip joint: Secondary | ICD-10-CM | POA: Insufficient documentation

## 2023-12-25 DIAGNOSIS — R937 Abnormal findings on diagnostic imaging of other parts of musculoskeletal system: Secondary | ICD-10-CM

## 2023-12-25 NOTE — Progress Notes (Signed)
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 visits and meeting all other requirements Future Appointments No visits were found meeting these conditions. Showing future appointments within next 90 days and meeting all other requirements   Primary Care Physician: Revelo, Adrian Mancheno, MD  Duration of encounter: 104 minutes.  Total time on encounter, as per AMA guidelines included  both the face-to-face and non-face-to-face time personally spent by the physician and/or other qualified health care professional(s) on the day of the encounter (includes time in activities that require the physician or other qualified health care professional and does not include time in activities normally performed by clinical staff). Physician's time may include the following activities when performed: Preparing to see the patient (e.g., pre-charting review of records, searching for previously ordered imaging, lab work, and nerve conduction tests) Review of prior analgesic pharmacotherapies. Reviewing PMP Interpreting ordered tests (e.g., lab work, imaging, nerve conduction tests) Performing post-procedure evaluations, including interpretation of diagnostic procedures Obtaining and/or reviewing separately obtained history Performing a medically appropriate examination and/or evaluation Counseling and educating the patient/family/caregiver Ordering medications, tests, or procedures Referring and  communicating with other health care professionals (when not separately reported) Documenting clinical information in the electronic or other health record Independently interpreting results (not separately reported) and communicating results to the patient/ family/caregiver Care coordination (not separately reported)  Note by: Eric DELENA Como, MD (TTS technology used. I apologize for any typographical errors that were not detected and corrected.) Date: 12/25/2023; Time: 3:35 PM     [1]  Current Outpatient Medications:    albuterol  (VENTOLIN  HFA) 108 (90 Base) MCG/ACT inhaler, Inhale 2 puffs into the lungs every 6 (six) hours as needed for wheezing or shortness of breath., Disp: 8 g, Rfl: 6   atorvastatin (LIPITOR) 20 MG tablet, Take 20 mg by mouth at bedtime., Disp: , Rfl:    Biotin (SUPER BIOTIN) 5 MG TABS, Take by mouth., Disp: , Rfl:    clindamycin (CLINDAGEL) 1 % gel, SMARTSIG:1 sparingly Topical Twice Daily, Disp: , Rfl:    clonazePAM  (KLONOPIN ) 0.5 MG tablet, Take 0.5-1 tablets (0.25-0.5 mg total) by mouth daily as needed for anxiety. Please limit use and use only for severe anxiety, Disp: 30 tablet, Rfl: 1   DULoxetine  (CYMBALTA ) 30 MG capsule, Take 1 capsule (30 mg total) by mouth daily., Disp: 90 capsule, Rfl: 1   hydrochlorothiazide (HYDRODIURIL) 25 MG tablet, Take 25 mg by mouth daily. , Disp: , Rfl:    hydrocortisone cream 1 %, Apply 1 a small amount to skin twice a day for 14 days, Disp: , Rfl:    Hydrocortisone, Perianal, 1 % CREA, Apply topically., Disp: , Rfl:    hydrOXYzine  (VISTARIL ) 50 MG capsule, TAKE 1 CAPSULE (50 MG TOTAL) BY MOUTH DAILY AS NEEDED, Disp: 90 capsule, Rfl: 1   levothyroxine  (SYNTHROID ) 75 MCG tablet, Take 75 mcg by mouth every other day. Alternating with 50 mcg every other day-AM, Disp: , Rfl:    levothyroxine  (SYNTHROID , LEVOTHROID) 50 MCG tablet, Take 50 mcg by mouth every other day. Alternating with 75 mcg every other day-AM, Disp: , Rfl:     losartan  (COZAAR ) 100 MG tablet, Take 100 mg by mouth daily. for high blood pressure, Disp: , Rfl:    metoprolol  (LOPRESSOR ) 50 MG tablet, Take 1 tablet (50 mg total) by mouth 2 (two) times daily., Disp: 60 tablet, Rfl: 0   mirtazapine  (REMERON ) 15 MG tablet, TAKE 1.5 TABLETS (22.5 MG TOTAL) BY MOUTH AT BEDTIME., Disp: 135 tablet, Rfl: 0   Multiple Vitamin (MULTIVITAMIN WITH MINERALS) TABS tablet, Take 1 tablet by mouth daily., Disp: , Rfl:    NONFORMULARY OR COMPOUNDED ITEM, 1 Device by Other route as directed. CPAP-with the pressure setting of 10 cm H20 with heated humidity and ResMed AirFit F20 Full Face Mask , size small., Disp: 1 each, Rfl: 0  Omega-3 Fatty Acids (FISH OIL PO), Take 1 capsule by mouth daily., Disp: , Rfl:    omeprazole (PRILOSEC) 20 MG capsule, Take 20 mg by mouth every morning., Disp: , Rfl:

## 2023-12-25 NOTE — Patient Instructions (Signed)
 ______________________________________________________________________    Procedure instructions  Stop blood-thinners  Do not eat or drink fluids (other than water ) for 6 hours before your procedure  No water  for 2 hours before your procedure  Take your blood pressure medicine with a sip of water   Arrive 30 minutes before your appointment  If sedation is planned, bring suitable driver. [place], [place], & public transportation are NOT APPROVED)  Carefully read the Preparing for your procedure detailed instructions  If you have questions call us  at [phone]  Procedure appointments are for procedures only.   NO medication refills or new problem evaluations will be done on procedure days.   Only the scheduled, pre-approved procedure and side will be done.   ______________________________________________________________________     ______________________________________________________________________    Preparing for your procedure  Appointments: If you think you may not be able to keep your appointment, call 24-48 hours in advance to cancel. We need time to make it available to others.  Procedure visits are for procedures only. During your procedure appointment there will be: NO Prescription Refills*. NO medication changes or discussions*. NO discussion of disability issues*. NO unrelated pain problem evaluations*. NO evaluations to order other pain procedures*. *These will be addressed at a separate and distinct evaluation encounter on the provider's evaluation schedule and not during procedure days.  Instructions: Food intake: Avoid eating anything solid for at least 8 hours prior to your procedure. Clear liquid intake: You may take clear liquids such as water  up to 2 hours prior to your procedure. (No carbonated drinks. No soda.) Transportation: Unless otherwise stated by your physician, bring a driver. (Driver cannot be a Market Researcher, Pharmacist, Community, or any other form of public  transportation.) Morning Medicines: Except for blood thinners, take all of your other morning medications with a sip of water . Make sure to take your heart and blood pressure medicines. If your blood pressure's lower number is above 100, the case will be rescheduled. Blood thinners: Make sure to stop your blood thinners as instructed.  If you take a blood thinner, but were not instructed to stop it, call our office [phone] and ask to talk to a nurse. Not stopping a blood thinner prior to certain procedures could lead to serious complications. Diabetics on insulin: Notify the staff so that you can be scheduled 1st case in the morning. If your diabetes requires high dose insulin, take only  of your normal insulin dose the morning of the procedure and notify the staff that you have done so. Preventing infections: Shower with an antibacterial soap the morning of your procedure.  Build-up your immune system: Take 1000 mg of Vitamin C with every meal (3 times a day) the day prior to your procedure. Antibiotics: Inform the nursing staff if you are taking any antibiotics or if you have any conditions that may require antibiotics prior to procedures. (Example: recent joint implants)   Pregnancy: If you are pregnant make sure to notify the nursing staff. Not doing so may result in injury to the fetus, including death.  Sickness: If you have a cold, fever, or any active infections, call and cancel or reschedule your procedure. Receiving steroids while having an infection may result in complications. Arrival: You must be in the facility at least 30 minutes prior to your scheduled procedure. Tardiness: Your scheduled time is also the cutoff time. If you do not arrive at least 15 minutes prior to your procedure, you will be rescheduled.  Children: Do not bring any children with  you. Make arrangements to keep them home. Dress appropriately: There is always a possibility that your clothing may get soiled. Avoid  long dresses. Valuables: Do not bring any jewelry or valuables.  Reasons to call and reschedule or cancel your procedure: (Following these recommendations will minimize the risk of a serious complication.) Surgeries: Avoid having procedures within 2 weeks of any surgery. (Avoid for 2 weeks before or after any surgery). Flu Shots: Avoid having procedures within 2 weeks of a flu shots or . (Avoid for 2 weeks before or after immunizations). Barium: Avoid having a procedure within 7-10 days after having had a radiological study involving the use of radiological contrast. (Myelograms, Barium swallow or enema study). Heart attacks: Avoid any elective procedures or surgeries for the initial 6 months after a Myocardial Infarction (Heart Attack). Blood thinners: It is imperative that you stop these medications before procedures. Let us  know if you if you take any blood thinner.  Infection: Avoid procedures during or within two weeks of an infection (including chest colds or gastrointestinal problems). Symptoms associated with infections include: Localized redness, fever, chills, night sweats or profuse sweating, burning sensation when voiding, cough, congestion, stuffiness, runny nose, sore throat, diarrhea, nausea, vomiting, cold or Flu symptoms, recent or current infections. It is specially important if the infection is over the area that we intend to treat. Heart and lung problems: Symptoms that may suggest an active cardiopulmonary problem include: cough, chest pain, breathing difficulties or shortness of breath, dizziness, ankle swelling, uncontrolled high or unusually low blood pressure, and/or palpitations. If you are experiencing any of these symptoms, cancel your procedure and contact your primary care physician for an evaluation.  Remember:  Regular Business hours are:  Monday to Thursday 8:00 AM to 4:00 PM  Provider's Schedule: Eric Como, MD:  Procedure days: Tuesday and Thursday 7:30  AM to 4:00 PM  Wallie Sherry, MD:  Procedure days: Monday and Wednesday 7:30 AM to 4:00 PM Last  Updated: 12/18/2022 ______________________________________________________________________     ______________________________________________________________________    General Risks and Possible Complications  Patient Responsibilities: It is important that you read this as it is part of your informed consent. It is our duty to inform you of the risks and possible complications associated with treatments offered to you. It is your responsibility as a patient to read this and to ask questions about anything that is not clear or that you believe was not covered in this document.  Patient's Rights: You have the right to refuse treatment. You also have the right to change your mind, even after initially having agreed to have the treatment done. However, under this last option, if you wait until the last second to change your mind, you may be charged for the materials used up to that point.  Introduction: Medicine is not an visual merchandiser. Everything in Medicine, including the lack of treatment(s), carries the potential for danger, harm, or loss (which is by definition: Risk). In Medicine, a complication is a secondary problem, condition, or disease that can aggravate an already existing one. All treatments carry the risk of possible complications. The fact that a side effects or complications occurs, does not imply that the treatment was conducted incorrectly. It must be clearly understood that these can happen even when everything is done following the highest safety standards.  No treatment: You can choose not to proceed with the proposed treatment alternative. The "PRO(s)" would include: avoiding the risk of complications associated with the therapy. The "CON(s)" would include:  not getting any of the treatment benefits. These benefits fall under one of three categories: diagnostic; therapeutic; and/or  palliative. Diagnostic benefits include: getting information which can ultimately lead to improvement of the disease or symptom(s). Therapeutic benefits are those associated with the successful treatment of the disease. Finally, palliative benefits are those related to the decrease of the primary symptoms, without necessarily curing the condition (example: decreasing the pain from a flare-up of a chronic condition, such as incurable terminal cancer).  General Risks and Complications: These are associated to most interventional treatments. They can occur alone, or in combination. They fall under one of the following six (6) categories: no benefit or worsening of symptoms; bleeding; infection; nerve damage; allergic reactions; and/or death. No benefits or worsening of symptoms: In Medicine there are no guarantees, only probabilities. No healthcare provider can ever guarantee that a medical treatment will work, they can only state the probability that it may. Furthermore, there is always the possibility that the condition may worsen, either directly, or indirectly, as a consequence of the treatment. Bleeding: This is more common if the patient is taking a blood thinner, either prescription or over the counter (example: Goody Powders, Fish oil, Aspirin , Garlic, etc.), or if suffering a condition associated with impaired coagulation (example: Hemophilia, cirrhosis of the liver, low platelet counts, etc.). However, even if you do not have one on these, it can still happen. If you have any of these conditions, or take one of these drugs, make sure to notify your treating physician. Infection: This is more common in patients with a compromised immune system, either due to disease (example: diabetes, cancer, human immunodeficiency virus [HIV], etc.), or due to medications or treatments (example: therapies used to treat cancer and rheumatological diseases). However, even if you do not have one on these, it can still  happen. If you have any of these conditions, or take one of these drugs, make sure to notify your treating physician. Nerve Damage: This is more common when the treatment is an invasive one, but it can also happen with the use of medications, such as those used in the treatment of cancer. The damage can occur to small secondary nerves, or to large primary ones, such as those in the spinal cord and brain. This damage may be temporary or permanent and it may lead to impairments that can range from temporary numbness to permanent paralysis and/or brain death. Allergic Reactions: Any time a substance or material comes in contact with our body, there is the possibility of an allergic reaction. These can range from a mild skin rash (contact dermatitis) to a severe systemic reaction (anaphylactic reaction), which can result in death. Death: In general, any medical intervention can result in death, most of the time due to an unforeseen complication. ______________________________________________________________________      ______________________________________________________________________    Steroid injections  Common steroids for injections Triamcinolone: Used by many sports medicine physicians for large joint and bursal injections, often combined with a local anesthetic like lidocaine . A study focusing on coccydynia (tailbone pain) found triamcinolone was more effective than betamethasone, suggesting it may also be preferable for other localized inflammation conditions. Methylprednisolone : A common alternative to triamcinolone that is also a strong anti-inflammatory. It is available in different formulations, with the acetate suspension being the long-acting option for intra-articular injections. Dexamethasone : This is a non-particulate steroid, meaning it has a lower risk of tissue damage compared to particulate steroids like triamcinolone and methylprednisolone . While less common for this specific  use,  it is an option for targeted injections.   Considerations for physicians Particulate vs. non-particulate steroids: Triamcinolone and methylprednisolone  are particulate, meaning they can clump together. Dexamethasone  is non-particulate. Particulate steroids are often preferred for their longer-lasting effects but carry a theoretical higher risk for certain injections (though this is less of a concern in the costochondral joints). Combined injectate: Corticosteroids are typically mixed with a local anesthetic like lidocaine  to provide both immediate pain relief (from the anesthetic) and longer-term inflammation reduction (from the steroid). Imaging guidance: To ensure accurate placement of the needle and medication, physicians may use ultrasound or fluoroscopic guidance for the injection, especially in complex or refractory cases.   Patient guidance Before undergoing a steroid injection, discuss the options with your physician. They will determine the best steroid, dosage, and procedure for your specific case based on factors like: Severity of your condition History of response to other treatments Your overall health status Experience and preference of the physician  Last  Updated: 09/03/2023 ______________________________________________________________________      ______________________________________________________________________  Spondylolisthesis  Spondylolisthesis is a condition that occurs when a vertebra in the spine slips out of place, usually in the lower back. Symptoms can vary from mild to severe, and a person may have no symptoms.  Some common symptoms include:  Back pain, especially chronic pain  Pain that radiates down the legs  Pain that worsens with exercise  Tightness in the hamstrings  Neck stiffness  Loss of spine flexibility  Weakness in the legs or trouble walking  Numbness and tingling in the groin and/or buttocks   Some causes of spondylolisthesis  include: Birth defects. Sudden injury. Abnormal wear on the cartilage and bones, such as arthritis. Bone disease and fractures. Certain sports activities, such as gymnastics, weightlifting, and football.  A doctor can diagnose spondylolisthesis with a physical exam, X-rays, and possibly a CT scan.    Forward slippage is known as "Anterolisthesis".  Backward slippage is known as "Retrolisthesis".   Pathophysiology of Spondylolisthesis:   Grading Classification of Spondylolisthesis Grade I spondylolisthesis is 1 to 25% slippage, grade II is up to 50% slippage, grade III is up to 75% slippage, and grade IV is 76-100% slippage. If there is more than 100% slippage, it is known as spondyloptosis or grade V spondylolisthesis.       ______________________________________________________________________

## 2023-12-25 NOTE — Progress Notes (Signed)
 Safety precautions to be maintained throughout the outpatient stay will include: orient to surroundings, keep bed in low position, maintain call bell within reach at all times, provide assistance with transfer out of bed and ambulation.

## 2023-12-30 ENCOUNTER — Ambulatory Visit
Admission: RE | Admit: 2023-12-30 | Discharge: 2023-12-30 | Disposition: A | Source: Ambulatory Visit | Attending: Nurse Practitioner

## 2023-12-30 ENCOUNTER — Ambulatory Visit
Admission: RE | Admit: 2023-12-30 | Discharge: 2023-12-30 | Disposition: A | Discharge status: Home / Self Care | Source: Ambulatory Visit | Attending: Nurse Practitioner | Admitting: Nurse Practitioner

## 2023-12-30 DIAGNOSIS — Z853 Personal history of malignant neoplasm of breast: Secondary | ICD-10-CM | POA: Insufficient documentation

## 2023-12-30 DIAGNOSIS — N6320 Unspecified lump in the left breast, unspecified quadrant: Secondary | ICD-10-CM | POA: Insufficient documentation

## 2023-12-31 ENCOUNTER — Other Ambulatory Visit: Payer: Self-pay | Admitting: Orthopedic Surgery

## 2023-12-31 ENCOUNTER — Telehealth: Admitting: Psychiatry

## 2023-12-31 ENCOUNTER — Encounter: Payer: Self-pay | Admitting: Psychiatry

## 2023-12-31 DIAGNOSIS — F410 Panic disorder [episodic paroxysmal anxiety] without agoraphobia: Secondary | ICD-10-CM | POA: Diagnosis not present

## 2023-12-31 DIAGNOSIS — F3342 Major depressive disorder, recurrent, in full remission: Secondary | ICD-10-CM | POA: Diagnosis not present

## 2023-12-31 DIAGNOSIS — Z79899 Other long term (current) drug therapy: Secondary | ICD-10-CM | POA: Diagnosis not present

## 2023-12-31 DIAGNOSIS — F424 Excoriation (skin-picking) disorder: Secondary | ICD-10-CM | POA: Diagnosis not present

## 2023-12-31 DIAGNOSIS — F4310 Post-traumatic stress disorder, unspecified: Secondary | ICD-10-CM | POA: Diagnosis not present

## 2023-12-31 DIAGNOSIS — F4010 Social phobia, unspecified: Secondary | ICD-10-CM | POA: Diagnosis not present

## 2023-12-31 DIAGNOSIS — G4701 Insomnia due to medical condition: Secondary | ICD-10-CM

## 2023-12-31 DIAGNOSIS — M25561 Pain in right knee: Secondary | ICD-10-CM

## 2023-12-31 MED ORDER — HYDROXYZINE PAMOATE 50 MG PO CAPS: 50.0000 mg | ORAL_CAPSULE | Freq: Every day | ORAL | 1 refills | Status: AC | PRN

## 2023-12-31 MED ORDER — DULOXETINE HCL 30 MG PO CPEP: 30.0000 mg | ORAL_CAPSULE | Freq: Every day | ORAL | 1 refills | Status: AC

## 2023-12-31 NOTE — Progress Notes (Signed)
 Virtual Visit via Video Note  I connected with Kelli Logan on 12/31/2023 at  4:00 PM EST by a video enabled telemedicine application and verified that I am speaking with the correct person using two identifiers.  Location Provider Location : [facility] Patient Location : Home  Participants: Patient , Provider    I discussed the limitations of evaluation and management by telemedicine and the availability of in person appointments. The patient expressed understanding and agreed to proceed.    I discussed the assessment and treatment plan with the patient. The patient was provided an opportunity to ask questions and all were answered. The patient agreed with the plan and demonstrated an understanding of the instructions.   The patient was advised to call back or seek an in-person evaluation if the symptoms worsen or if the condition fails to improve as anticipated.   BH MD OP Progress Note  12/31/2023 4:26 PM Kelli Logan  MRN:  [id number]  Chief Complaint:  Chief Complaint  Patient presents with   Medication Refill   Follow-up   Depression   Anxiety   Discussed the use of AI scribe software for clinical note transcription with the patient, who gave verbal consent to proceed.  History of Present Illness Kelli Logan is a 63 year old Caucasian female, divorced, lives in [place], has a history of PTSD, skin picking disorder, MDD, panic disorder, social anxiety disorder, obstructive sleep apnea on CPAP, history of borderline personality disorder, fibromyalgia, total hip replacement per history, breast cancer was evaluated by telemedicine today for a follow-up appointment.  She reports she is currently undergoing diagnostic evaluation to rule out breast cancer, mass of left breast, recently underwent  imaging and is currently scheduled for biopsy.  She reports she was told her breast mass is highly suggestive of cancer and she was told she will need surgery whether it is  cancerous or not.  This has been anxiety provoking although she has been coping okay.  She currently does not know who is going to support her once she has her surgery since she lives alone.  She believes she has been through several surgeries previously and did okay.  She however agrees to discuss with her provider.  Ongoing sleep issues affects her.  She experiences fibromyalgia pain and reports that she discontinued gabapentin  due to side effects and lack of efficacy. Her current medications include duloxetine  and mirtazapine  (1.5 tablets), and she uses hydroxyzine  as needed.  She continues to have skin picking, which is chronic.  She denies any thoughts of harming herself or others and denies experiencing psychosis.  She is interested in trial of Lyrica and agrees to get a urine drug screen completed.  She denies any other concerns today.   Visit Diagnosis:    ICD-10-CM   1. PTSD (post-traumatic stress disorder)  F43.10 DULoxetine  (CYMBALTA ) 30 MG capsule    hydrOXYzine  (VISTARIL ) 50 MG capsule    2. Skin-picking disorder  F42.4 DULoxetine  (CYMBALTA ) 30 MG capsule    3. Recurrent major depressive disorder, in full remission  F33.42     4. Panic disorder  F41.0 DULoxetine  (CYMBALTA ) 30 MG capsule    5. Social anxiety disorder  F40.10 DULoxetine  (CYMBALTA ) 30 MG capsule    6. Insomnia due to medical condition  G47.01    PTSD, obstructive sleep apnea on CPAP, pain, situational stressors including current living situation    7. High risk medication use  Z79.899 Urine drugs of abuse scrn w alc, routine (Ref Lab)  Past Psychiatric History: I have reviewed past psychiatric history from progress note on 03/10/2018.  Past trials of medications multiple including BuSpar , gabapentin .  Past Medical History:  Past Medical History:  Diagnosis Date   Anxiety    Arthritis    Cancer (HCC)    breast left   Depression    Fibromyalgia    GERD (gastroesophageal reflux disease)     Headache    History of kidney stones    h/o   Hypertension    Hypothyroidism    Sleep apnea    DOES NOT USE CPAP   Thyroid disease     Past Surgical History:  Procedure Laterality Date   APPENDECTOMY     BREAST BIOPSY Left few yrs ago   stereotactic bx in [place], benign   BREAST BIOPSY Left 12/26/2017   Affirm Bx IMC- X-Clip   BREAST BIOPSY Left 01/14/2018   positive- pt had mastectomy   BREAST IMPLANT EXCHANGE Right 02/16/2021   Procedure: PLACEMENT OF BREAST IMPLANT;  Surgeon: Lowery Estefana RAMAN, DO;  Location: [facility];  Service: Plastics;  Laterality: Right;  1.5 hour   BREAST RECONSTRUCTION WITH PLACEMENT OF TISSUE EXPANDER AND ALLODERM Left 04/08/2018   Procedure: LEFT BREAST IMMEDIATE  RECONSTRUCTION WITH EXPANDER AND FLEX HD;  Surgeon: Lowery Estefana RAMAN, DO;  Location: [facility];  Service: Plastics;  Laterality: Left;   COLONOSCOPY WITH PROPOFOL  N/A 06/26/2018   Procedure: COLONOSCOPY WITH PROPOFOL ;  Surgeon: Janalyn Keene NOVAK, MD;  Location: [facility];  Service: Endoscopy;  Laterality: N/A;   DRUG INDUCED ENDOSCOPY Right 02/27/2022   Procedure: DRUG INDUCED ENDOSCOPY;  Surgeon: Carlie Clark, MD;  Location: [facility];  Service: ENT;  Laterality: Right;   ESOPHAGOGASTRODUODENOSCOPY (EGD) WITH PROPOFOL  N/A 06/26/2018   Procedure: ESOPHAGOGASTRODUODENOSCOPY (EGD) WITH PROPOFOL ;  Surgeon: Janalyn Keene NOVAK, MD;  Location: [facility];  Service: Endoscopy;  Laterality: N/A;   EYE SURGERY Bilateral    lasik   FOOT BONE EXCISION     IMAGE GUIDED SINUS SURGERY  10/220   LIPOSUCTION Bilateral 02/16/2021   Procedure: LIPOSUCTION OF BILATERAL BREAST;  Surgeon: Lowery Estefana RAMAN, DO;  Location: [facility];  Service: Plastics;  Laterality: Bilateral;   LIPOSUCTION WITH LIPOFILLING Left 04/30/2019   Procedure: Fat grafting to left breast;  Surgeon: Lowery Estefana RAMAN, DO;  Location: [facility];  Service:  Plastics;  Laterality: Left;  90 min, please   MASTECTOMY Left 04/08/2019   MASTECTOMY W/ SENTINEL NODE BIOPSY Left 04/08/2018   Procedure: MASTECTOMY WITH SENTINEL LYMPH NODE BIOPSY LEFT;  Surgeon: Marolyn Nest, MD;  Location: [facility];  Service: General;  Laterality: Left;   MASTOPEXY Right 08/04/2018   Procedure: MASTOPEXY;  Surgeon: Lowery Estefana RAMAN, DO;  Location: [facility];  Service: Plastics;  Laterality: Right;  TOTAL CASE TIME SHOULD BE 3 HOURS, PLEASE   REMOVAL OF TISSUE EXPANDER AND PLACEMENT OF IMPLANT Left 08/04/2018   Procedure: REMOVAL OF TISSUE EXPANDER AND PLACEMENT OF IMPLANT;  Surgeon: Lowery Estefana RAMAN, DO;  Location: [facility];  Service: Plastics;  Laterality: Left;   SCAR REVISION Left 04/30/2019   Procedure: release of scar contracture to left breast;  Surgeon: Lowery Estefana RAMAN, DO;  Location:  SUR[facility]ERY C[facility];  Service: Plastics;  Laterality: Left;   TOTAL HIP ARTHROPLASTY Left 02/11/2020   Procedure: TOTAL HIP ARTHROPLASTY ANTERIOR APPROACH;  Surgeon: Kathlynn Sharper, MD;  Location: [facility];  Service: Orthopedics;  Laterality: Left;   UMBILICAL HERNIA REPAIR  80 and 1985    Family Psychiatric History: I have reviewed family psychiatric history from progress note on 03/10/2018.  Family History:  Family History  Problem Relation Age of Onset   Hypertension Mother    Lung cancer Maternal Grandmother    Heart disease Father    Alcohol abuse Father    Breast cancer Neg Hx    Colon cancer Neg Hx     Social History: I have reviewed social history from progress note on 03/10/2018. Social History   Socioeconomic History   Marital status: Divorced    Spouse name: Not on file   Number of children: 1   Years of education: Not on file   Highest education level: High school graduate  Occupational History   Not on file  Tobacco Use   Smoking status: Former    Current packs/day: 0.00    Average packs/day: 1 pack/day for 2.0 years (2.0 ttl pk-yrs)     Types: Cigarettes    Start date: 01/07/2015    Quit date: 01/06/2017    Years since quitting: 6.9   Smokeless tobacco: Never  Vaping Use   Vaping status: Former   Quit date: 11/27/2016  Substance and Sexual Activity   Alcohol use: Yes    Comment: rare beer    Drug use: Not Currently   Sexual activity: Not on file  Other Topics Concern   Not on file  Social History Narrative   Home on disability [pysche problems]; transportation issues; worked in chief financial officer. From [place]; moved after separation. Quit smoking; ocassional alcohol.    Social Drivers of Health   Tobacco Use: Medium Risk (12/31/2023)   Patient History    Smoking Tobacco Use: Former    Smokeless Tobacco Use: Never    Passive Exposure: Not on file  Financial Resource Strain: Medium Risk (12/24/2023)   Received from [facility]   Overall Financial Resource Strain (CARDIA)    Difficulty of Paying Living Expenses: Somewhat hard  Food Insecurity: Food Insecurity Present (12/24/2023)   Received from [facility]   Epic    Within the past 12 months, you worried that your food would run out before you got the money to buy more.: Sometimes true    Within the past 12 months, the food you bought just didn't last and you didn't have money to get more.: Sometimes true  Transportation Needs: No Transportation Needs (12/24/2023)   Received from [facility] - Transportation    In the past 12 months, has lack of transportation kept you from medical appointments or from getting medications?: No    Lack of Transportation (Non-Medical): No  Physical Activity: Not on file  Stress: Not on file  Social Connections: Not on file  Depression (PHQ2-9): Low Risk (11/25/2023)   Depression (PHQ2-9)    PHQ-2 Score: 0  Alcohol Screen: Not on file  Housing: Low Risk  (12/24/2023)   Received from [facility]   Epic    In the last 12 months, was there a  time when you were not able to pay the mortgage or rent on time?: No    In the past 12 months, how many times have you moved where you were living?: 0    At any time in the past 12 months, were you homeless or living in a shelter (including now)?: No  Utilities: Not At Risk (12/24/2023)   Received from [facility]  Epic    In the past 12 months has the electric, gas, oil, or water company threatened to shut off services in your home?: No  Health Literacy: Not on file    Allergies: Allergies[1]  Metabolic Disorder Labs: No results found for: HGBA1C, MPG No results found for: PROLACTIN No results found for: CHOL, TRIG, HDL, CHOLHDL, VLDL, LDLCALC No results found for: TSH  Therapeutic Level Labs: No results found for: LITHIUM No results found for: VALPROATE No results found for: CBMZ  Current Medications: Current Outpatient Medications  Medication Sig Dispense Refill   albuterol  (VENTOLIN  HFA) 108 (90 Base) MCG/ACT inhaler Inhale 2 puffs into the lungs every 6 (six) hours as needed for wheezing or shortness of breath. 8 g 6   atorvastatin (LIPITOR) 20 MG tablet Take 20 mg by mouth at bedtime.     Biotin (SUPER BIOTIN) 5 MG TABS Take by mouth.     clindamycin (CLINDAGEL) 1 % gel SMARTSIG:1 sparingly Topical Twice Daily     clonazePAM  (KLONOPIN ) 0.5 MG tablet Take 0.5-1 tablets (0.25-0.5 mg total) by mouth daily as needed for anxiety. Please limit use and use only for severe anxiety 30 tablet 1   DULoxetine  (CYMBALTA ) 30 MG capsule Take 1 capsule (30 mg total) by mouth daily. 90 capsule 1   hydrochlorothiazide (HYDRODIURIL) 25 MG tablet Take 25 mg by mouth daily.      hydrocortisone cream 1 % Apply 1 a small amount to skin twice a day for 14 days     Hydrocortisone, Perianal, 1 % CREA Apply topically.     hydrOXYzine  (VISTARIL ) 50 MG capsule Take 1 capsule (50 mg total) by mouth daily as needed. 90 capsule 1   levothyroxine  (SYNTHROID ) 75  MCG tablet Take 75 mcg by mouth every other day. Alternating with 50 mcg every other day-AM     levothyroxine  (SYNTHROID , LEVOTHROID) 50 MCG tablet Take 50 mcg by mouth every other day. Alternating with 75 mcg every other day-AM     losartan  (COZAAR ) 100 MG tablet Take 100 mg by mouth daily. for high blood pressure     metoprolol  (LOPRESSOR ) 50 MG tablet Take 1 tablet (50 mg total) by mouth 2 (two) times daily. 60 tablet 0   mirtazapine  (REMERON ) 15 MG tablet TAKE 1.5 TABLETS (22.5 MG TOTAL) BY MOUTH AT BEDTIME. 135 tablet 0   Multiple Vitamin (MULTIVITAMIN WITH MINERALS) TABS tablet Take 1 tablet by mouth daily.     NONFORMULARY OR COMPOUNDED ITEM 1 Device by Other route as directed. CPAP-with the pressure setting of 10 cm H20 with heated humidity and ResMed AirFit F20 Full Face Mask , size small. 1 each 0   Omega-3 Fatty Acids (FISH OIL PO) Take 1 capsule by mouth daily.     omeprazole (PRILOSEC) 20 MG capsule Take 20 mg by mouth every morning.     No current facility-administered medications for this visit.     Musculoskeletal: Strength & Muscle Tone: UTA Gait & Station: Seated Patient leans: N/A  Psychiatric Specialty Exam: Review of Systems  Psychiatric/Behavioral:  Positive for sleep disturbance. The patient is nervous/anxious.     Last menstrual period 02/05/2016.There is no height or weight on file to calculate BMI.  General Appearance: Casual  Eye Contact:  Fair  Speech:  Clear and Coherent  Volume:  Normal  Mood:  Anxious  Affect:  Congruent  Thought Process:  Goal Directed and Descriptions of Associations: Intact  Orientation:  Full (Time, Place, and Person)  Thought Content: Logical  Suicidal Thoughts:  No  Homicidal Thoughts:  No  Memory:  Immediate;   Fair Recent;   Fair Remote;   Fair  Judgement:  Fair  Insight:  Fair  Psychomotor Activity:  Normal  Concentration:  Concentration: Fair and Attention Span: Fair  Recall:  [employer] of Knowledge: Fair   Language: Fair  Akathisia:  No  Handed:  Right  AIMS (if indicated): not done  Assets:  Communication Skills Desire for Improvement Housing Social Support Transportation  ADL's:  Intact  Cognition: WNL  Sleep:  varies   Screenings: AIMS    Flowsheet Row Video Visit from 06/22/2021 in [facility] Video Visit from 05/22/2021 in [facility]  AIMS Total Score 0 0   GAD-7    Flowsheet Row Office Visit from 11/21/2023 in [facility] Office Visit from 11[facility]13/2022 in [facility] Office Visit from 01/17/2022 in [facility] Video Visit from 11/16/2021 in [facility] Video Visit from 09/28/2021 in [facility]  Total GAD-7 Score 19 19 14 9 14    PHQ2-9    Flowsheet Row Office Visit from 11/25/2023 in [facility] Visit from 11/21/2023 in [facility] Office Visit from 11/13/2022 in [facility] Office Visit from 01/17/2022 in [facility] Video Visit from 09/28/2021 in [facility]  PHQ-2 Total Score 0 0 1 1 1   PHQ-9 Total Score -- -- -- 5 --   Flowsheet Row Video Visit from 12/31/2023 in [facility] Office Visit from 11/21/2023 in [facility] Video Visit from 08/26/2023 in [facility]  C-SSRS RISK CATEGORY Moderate Risk No Risk Moderate Risk     Assessment and Plan: Kelli Logan is a 63 year old Caucasian female who presented for a follow-up appointment, discussed  assessment and plan as noted below.  1. PTSD (post-traumatic stress disorder)-unstable Currently with ongoing mood symptoms with multiple situational stressors.  Declines psychotherapy referral.  Plan to start Lyrica once urine drug screen completed to target mood, sleep and pain Continue Mirtazapine  22.5 mg at bedtime  2. Skin-picking disorder-improving Continues to have episodic flareups. Declines referral for CBT Continue Mirtazapine  as prescribed  3. Recurrent major depressive disorder, in full remission Currently denies any significant depression symptoms Continue Cymbalta  30 mg daily Continue Mirtazapine  as prescribed  4. Social anxiety disorder-improving Currently reports improvement in social anxiety symptoms. Will monitor closely  5. Insomnia due to medical condition-unstable Sleep problems mostly due to pain. Will consider addition of Lyrica once urine drug screen reviewed   6. Panic disorder-improving Although anxious denies any panic attacks recently. Declines referral for CBT Continue Cymbalta  and Mirtazapine  as prescribed Continue Hydroxyzine  50 mg daily as needed Plan to start Lyrica once urine drug screen completed.   7. High risk medication use I have ordered urine drug screen to [facility] lab.  Patient advised to get this completed  Follow-up Follow-up in clinic in 3 to 4 weeks or sooner if needed.   Collaboration of Care: Collaboration of Care: Patient refused AEB patient declines referral for CBT  Patient/Guardian was advised Release of Information must be obtained prior to any record release in order to collaborate their care with an outside  provider. Patient/Guardian was advised if they have not already done so to contact the registration department to sign all necessary forms in order for us  to release information regarding their care.   Consent: Patient/Guardian gives verbal consent for treatment and assignment of benefits for services provided during  this visit. Patient/Guardian expressed understanding and agreed to proceed.   This note was generated in part or whole with voice recognition software. Voice recognition is usually quite accurate but there are transcription errors that can and very often do occur. I apologize for any typographical errors that were not detected and corrected.    Kelli Cogan, MD 01/01/2024, 7:48 AM     [1]  Allergies Allergen Reactions   Tape Other (See Comments)    Steri strips after breast surgery/Burned skin   Tegaderm and other tape OK

## 2024-01-01 ENCOUNTER — Ambulatory Visit
Admission: RE | Admit: 2024-01-01 | Discharge: 2024-01-01 | Disposition: A | Discharge status: Home / Self Care | Source: Ambulatory Visit | Attending: Orthopedic Surgery | Admitting: Orthopedic Surgery

## 2024-01-01 DIAGNOSIS — M25561 Pain in right knee: Secondary | ICD-10-CM | POA: Insufficient documentation

## 2024-01-06 ENCOUNTER — Other Ambulatory Visit
Admission: RE | Admit: 2024-01-06 | Discharge: 2024-01-06 | Disposition: A | Discharge status: Home / Self Care | Payer: Self-pay | Source: Ambulatory Visit | Attending: Medical Genetics | Admitting: Medical Genetics

## 2024-01-06 DIAGNOSIS — Z006 Encounter for examination for normal comparison and control in clinical research program: Secondary | ICD-10-CM | POA: Insufficient documentation

## 2024-01-06 DIAGNOSIS — Z79899 Other long term (current) drug therapy: Secondary | ICD-10-CM | POA: Insufficient documentation

## 2024-01-07 ENCOUNTER — Other Ambulatory Visit: Payer: Self-pay | Admitting: Nurse Practitioner

## 2024-01-07 DIAGNOSIS — R928 Other abnormal and inconclusive findings on diagnostic imaging of breast: Secondary | ICD-10-CM

## 2024-01-07 LAB — URINE DRUGS OF ABUSE SCREEN W ALC, ROUTINE (REF LAB)
Amphetamines, Urine: NEGATIVE ng/mL
Barbiturate, Ur: NEGATIVE ng/mL
Benzodiazepine Quant, Ur: NEGATIVE ng/mL
Cannabinoid Quant, Ur: NEGATIVE ng/mL
Cocaine (Metab.): NEGATIVE ng/mL
Creatinine, Urine: 75.9 mg/dL (ref 20.0–300.0)
Ethanol U, Quan: NEGATIVE %
Methadone Screen, Urine: NEGATIVE ng/mL
Nitrite Urine, Quantitative: NEGATIVE ug/mL
OPIATE SCREEN URINE: NEGATIVE ng/mL
Phencyclidine, Ur: NEGATIVE ng/mL
Propoxyphene, Urine: NEGATIVE ng/mL
pH, Urine: 6.4 (ref 4.5–8.9)

## 2024-01-08 ENCOUNTER — Ambulatory Visit: Admission: RE | Admit: 2024-01-08 | Admitting: Gastroenterology

## 2024-01-08 ENCOUNTER — Encounter: Admission: RE | Payer: Self-pay

## 2024-01-08 ENCOUNTER — Ambulatory Visit: Payer: Self-pay | Admitting: Psychiatry

## 2024-01-08 ENCOUNTER — Ambulatory Visit
Admission: RE | Admit: 2024-01-08 | Discharge: 2024-01-08 | Disposition: A | Discharge status: Home / Self Care | Source: Ambulatory Visit | Attending: Nurse Practitioner | Admitting: Nurse Practitioner

## 2024-01-08 DIAGNOSIS — R928 Other abnormal and inconclusive findings on diagnostic imaging of breast: Secondary | ICD-10-CM | POA: Diagnosis present

## 2024-01-08 HISTORY — PX: BREAST BIOPSY: SHX20

## 2024-01-08 SURGERY — COLONOSCOPY
Anesthesia: General

## 2024-01-08 MED ORDER — LIDOCAINE-EPINEPHRINE 1 %-1:100000 IJ SOLN
10.0000 mL | Freq: Once | INTRAMUSCULAR | Status: AC
  Administered 2024-01-08: 10 mL via INTRADERMAL
  Filled 2024-01-08: qty 10

## 2024-01-08 MED ORDER — LIDOCAINE 1 % OPTIME INJ - NO CHARGE
2.0000 mL | Freq: Once | INTRAMUSCULAR | Status: AC
  Administered 2024-01-08: 2 mL via INTRADERMAL
  Filled 2024-01-08: qty 2

## 2024-01-08 NOTE — Progress Notes (Signed)
 Called patient to discuss message from provider no answer left voicemail for patient to return call to office also let patient know that the office would close at noon and will reopen on Friday morning

## 2024-01-08 NOTE — Telephone Encounter (Signed)
 I have reviewed urine drug screen dated 01/06/2024.  Negative for drugs tested.  Plan was to initiate Lyrica however based on review of controlled substance database she is currently on gabapentin  which was recently refilled.  I will not recommend starting Lyrica along with gabapentin .  She will have to discuss with her provider about gabapentin  and if she is going to discontinue this medication with approval from her provider then we will consider initiation of Lyrica in the future.  Will have staff contact patient to discuss.

## 2024-01-10 LAB — SURGICAL PATHOLOGY

## 2024-01-13 ENCOUNTER — Telehealth: Payer: Self-pay | Admitting: Psychiatry

## 2024-01-13 DIAGNOSIS — C50912 Malignant neoplasm of unspecified site of left female breast: Secondary | ICD-10-CM

## 2024-01-13 NOTE — Progress Notes (Signed)
 Received referral for newly diagnosed breast cancer from [facility].  Navigation initiated.  Referral sent to [facility], their office will call with an appt.   She will see Dr. KATHEE on 1/12 at 2:00.

## 2024-01-13 NOTE — Telephone Encounter (Signed)
 Contacted patient by phone.  Plan to hold off Lyrica until she has further plan set up for her cancer diagnosis and treatment.

## 2024-01-17 LAB — GENECONNECT MOLECULAR SCREEN: Genetic Analysis Overall Interpretation: NEGATIVE

## 2024-01-20 ENCOUNTER — Other Ambulatory Visit: Payer: Self-pay | Admitting: Internal Medicine

## 2024-01-20 ENCOUNTER — Inpatient Hospital Stay

## 2024-01-20 ENCOUNTER — Other Ambulatory Visit: Payer: Self-pay | Admitting: *Deleted

## 2024-01-20 ENCOUNTER — Inpatient Hospital Stay: Attending: Internal Medicine | Admitting: Internal Medicine

## 2024-01-20 ENCOUNTER — Encounter: Payer: Self-pay | Admitting: Internal Medicine

## 2024-01-20 ENCOUNTER — Encounter: Payer: Self-pay | Admitting: *Deleted

## 2024-01-20 DIAGNOSIS — C50412 Malignant neoplasm of upper-outer quadrant of left female breast: Secondary | ICD-10-CM

## 2024-01-20 DIAGNOSIS — Z170 Estrogen receptor positive status [ER+]: Secondary | ICD-10-CM

## 2024-01-20 DIAGNOSIS — F4310 Post-traumatic stress disorder, unspecified: Secondary | ICD-10-CM | POA: Diagnosis not present

## 2024-01-20 NOTE — Assessment & Plan Note (Addendum)
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  Patient declined letrozole  after 2 months because of intolerance [hot flashes extreme fatigue postcoital pain; anxiety episodes etc.] at the time she declined any other endocrine therapy  #  DEC 2025- [palpable lump]-  Highly suspicious LEFT breast mass in the area of palpable concern measuring 12 mm in maximum diameter . There is no evidence of LEFT axillary lymphadenopathy. Ultrasound-guided biopsy-ER-Pos; PR-neg; Her 2 neu-   # Suspect local recurrence vs-new primary and residual breast tissue [s/p left mastectomy 2020]-similar hormone receptor profile.  Recommend surgical excision; and consideration of local radiation postsurgery.  Low clinical concerns for metastatic disease.  However it is reasonable to evaluate with a scan to evaluate for distant metastatic disease.  I left a message for Dr. Marinda to discuss the treatment plan.   # Patient will need endocrine therapy; and also consider CDK inhibitor- given local recurrence.    # 2020 based on mammogram/ultrasound 2026 January -LEFT mastectomy with implant reconstruction- intracapsular rupture.  Will consider MRI of the BILATERAL breasts with and without contrast to include implant sequences-Will discuss with surgery/plastics  Thank you Ms.Debarah FNP. for allowing me to participate in the care of your pleasant patient. Please do not hesitate to contact me with questions or concerns in the interim.  Discussed with Therisa Ada.  # 60 minutes face-to-face with the patient discussing the above plan of care; more than 50% of time spent on prognosis/ natural history; counseling and coordination.   # DISPOSITION:  # follow up TBD--Dr.B

## 2024-01-20 NOTE — Progress Notes (Signed)
Accompanied patient  to initial medical oncology appointment.   Reviewed Breast Cancer treatment handbook.   Care plan summary given to patient.   Reviewed outreach programs and cancer center services.

## 2024-01-20 NOTE — Progress Notes (Signed)
 [name]  Patient Care Team: Revelo, Yancy Roof, MD as PCP - General (Family Medicine) Georgina Shasta POUR, RN as Oncology Nurse Navigator Rennie Cindy SAUNDERS, MD as Consulting Physician (Oncology)  CHIEF COMPLAINTS/PURPOSE OF CONSULTATION: Breast cancer  #  Oncology History Overview Note  # DEC 2019/MARCH 2020- LEFT BREAST IMC ER-Pos [>90%]; PR Neg; Her 2 NEG; 6mm; sN0 [pT1b sN0]; stage I; high grade DCIS with comedonecrosis/ calcifications- 5 x5.4cm. s/p mastectomy [Dr.Cannon/[place]];   # Oncotype-RS- 20; Distant recurrence-risk of recurrence 6%.  No adjuvant chemo.  No radiation.  #May 09, 2018-letrozole  [LMP- 2018] x discontinued because of severe intolerance [severe fatigue/arthralgias/postcoital pain]- after 1 month.   # anxiety/depression  DIAGNOSIS: Right Breast ca  STAGE:  I; GOALS: cure  CURRENT/MOST RECENT THERAPY: surveilliance     Malignant neoplasm of lower-outer quadrant of left breast of female, estrogen receptor positive (HCC)  Malignant neoplasm of upper-outer quadrant of left breast in female, estrogen receptor positive (HCC)  04/08/2018 Initial Diagnosis   Malignant neoplasm of upper-outer quadrant of left breast in female, estrogen receptor positive (HCC)   01/20/2024 Cancer Staging   Staging form: Breast, AJCC 8th Edition - Clinical: Stage IA (cT1b, cN0, cM0, G2, ER+, PR-, HER2-) - Signed by Rennie Cindy SAUNDERS, MD on 01/20/2024 Histologic grading system: 3 grade system      HISTORY OF PRESENTING ILLNESS:  Kelli Logan 64 y.o.  female history of left breast cancer ER PR positive HER-2 negative stage I status post mastectomy is here for follow-up.  Discussed the use of AI scribe software for clinical note transcription with the patient, who gave verbal consent to proceed.  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  Over the past two months, she identified a firm, palpable mass over the left breast bone, initially associated with pruritus. The mass was described as hard, similar in consistency to bone, and absent on the contralateral side. She notes the mass is now less prominent but remains visible and causes dimpling with muscle flexion. The area is tender with intermittent pruritus. She recalls being told that imaging showed a spiculated mass measuring 10 x 12 x 11 mm in the left breast and that her silicone implant was ruptured. Biopsy results showed cancer recurrence, which the patient understands as similar to her previous diagnosis.  She experiences chronic discomfort related to the left breast implant, including a sensation of implant migration into the axilla, particularly when lying on her side, and numbness across the chest that occasionally disrupts sleep. The implant is more prominent and displaced compared to the contralateral side, and she attributes some symptoms to the larger implant. She describes persistent tenderness and altered sensation in the area for years. There is a small subcutaneous lump at the reconstructed nipple, which intermittently appears orange-yellow, suggestive of compromised vascular supply. She also notes the left nipple is larger and flatter than the right following reconstruction.  She had a two-week episode of deep, persistent aching pain in the left bicep, which she initially feared was cardiac in origin. The pain was present upon awakening and persisted throughout the day, but resolved after recent imaging and interventions. She speculates the implant may have impinged on a structure, causing the pain.  She previously discontinued letrozole  after one month due to side effects  she attributed to menopause, and expresses regret for not continuing therapy. She is open to  alternative anti-hormonal agents and inquires about the need for adjuvant radiation therapy following surgery, noting she did not receive it previously.  She expresses concerns regarding surgical planning, including the choice of surgeon, management of the ruptured implant, and possible revision of the reconstruction. She recounts a prior negative postoperative experience, including severe pain and delayed attention to her surgical drain, which caused significant distress.      Review of Systems  Constitutional:  Positive for malaise/fatigue. Negative for chills, diaphoresis, fever and weight loss.  HENT:  Negative for nosebleeds and sore throat.   Eyes:  Negative for double vision.  Respiratory:  Negative for cough, hemoptysis, sputum production, shortness of breath and wheezing.   Cardiovascular:  Negative for chest pain, palpitations, orthopnea and leg swelling.  Gastrointestinal:  Negative for abdominal pain, blood in stool, constipation, diarrhea, heartburn, melena, nausea and vomiting.  Genitourinary:  Negative for dysuria, frequency and urgency.  Musculoskeletal:  Positive for back pain and joint pain.  Skin: Negative.  Negative for itching and rash.  Neurological:  Negative for dizziness, tingling, focal weakness, weakness and headaches.  Endo/Heme/Allergies:  Does not bruise/bleed easily.  Psychiatric/Behavioral:  Negative for depression. The patient is nervous/anxious. The patient does not have insomnia.      MEDICAL HISTORY:  Past Medical History:  Diagnosis Date   Anxiety    Arthritis    Cancer (HCC)    breast left   Depression    Fibromyalgia    GERD (gastroesophageal reflux disease)    Headache    History of kidney stones    h/o   Hypertension    Hypothyroidism    Sleep apnea    DOES NOT USE CPAP   Thyroid disease     SURGICAL HISTORY: Past Surgical History:  Procedure Laterality Date   APPENDECTOMY     BREAST BIOPSY Left few yrs ago   stereotactic bx in [place],  benign   BREAST BIOPSY Left 12/26/2017   Affirm Bx IMC- X-Clip   BREAST BIOPSY Left 01/14/2018   positive- pt had mastectomy   BREAST BIOPSY Left 01/08/2024   US  LT BREAST BX W LOC DEV 1ST LESION IMG BX SPEC US  GUIDE 01/08/2024 [facility]   BREAST IMPLANT EXCHANGE Right 02/16/2021   Procedure: PLACEMENT OF BREAST IMPLANT;  Surgeon: Lowery Estefana RAMAN, DO;  Location: [facility];  Service: Plastics;  Laterality: Right;  1.5 hour   BREAST RECONSTRUCTION WITH PLACEMENT OF TISSUE EXPANDER AND ALLODERM Left 04/08/2018   Procedure: LEFT BREAST IMMEDIATE  RECONSTRUCTION WITH EXPANDER AND FLEX HD;  Surgeon: Lowery Estefana RAMAN, DO;  Location: [facility];  Service: Plastics;  Laterality: Left;   COLONOSCOPY WITH PROPOFOL  N/A 06/26/2018   Procedure: COLONOSCOPY WITH PROPOFOL ;  Surgeon: Janalyn Keene NOVAK, MD;  Location: [facility];  Service: Endoscopy;  Laterality: N/A;   DRUG INDUCED ENDOSCOPY Right 02/27/2022   Procedure: DRUG INDUCED ENDOSCOPY;  Surgeon: Carlie Clark, MD;  Location: [facility];  Service: ENT;  Laterality: Right;   ESOPHAGOGASTRODUODENOSCOPY (EGD) WITH PROPOFOL  N/A 06/26/2018   Procedure: ESOPHAGOGASTRODUODENOSCOPY (EGD) WITH PROPOFOL ;  Surgeon: Janalyn Keene NOVAK, MD;  Location: [facility];  Service: Endoscopy;  Laterality: N/A;   EYE SURGERY Bilateral    lasik   FOOT BONE EXCISION     IMAGE GUIDED SINUS SURGERY  10/220   LIPOSUCTION Bilateral 02/16/2021   Procedure: LIPOSUCTION OF BILATERAL BREAST;  Surgeon: Lowery Estefana RAMAN,  DO;  Location: [facility];  Service: Plastics;  Laterality: Bilateral;   LIPOSUCTION WITH LIPOFILLING Left 04/30/2019   Procedure: Fat grafting to left breast;  Surgeon: Lowery Estefana RAMAN, DO;  Location: [facility];  Service: Plastics;  Laterality: Left;  90 min, please   MASTECTOMY Left 04/08/2019   MASTECTOMY W/ SENTINEL NODE BIOPSY Left 04/08/2018   Procedure: MASTECTOMY  WITH SENTINEL LYMPH NODE BIOPSY LEFT;  Surgeon: Marolyn Nest, MD;  Location: [facility];  Service: General;  Laterality: Left;   MASTOPEXY Right 08/04/2018   Procedure: MASTOPEXY;  Surgeon: Lowery Estefana RAMAN, DO;  Location: [facility];  Service: Plastics;  Laterality: Right;  TOTAL CASE TIME SHOULD BE 3 HOURS, PLEASE   REMOVAL OF TISSUE EXPANDER AND PLACEMENT OF IMPLANT Left 08/04/2018   Procedure: REMOVAL OF TISSUE EXPANDER AND PLACEMENT OF IMPLANT;  Surgeon: Lowery Estefana RAMAN, DO;  Location: [facility];  Service: Plastics;  Laterality: Left;   SCAR REVISION Left 04/30/2019   Procedure: release of scar contracture to left breast;  Surgeon: Lowery Estefana RAMAN, DO;  Location: [facility];  Service: Plastics;  Laterality: Left;   TOTAL HIP ARTHROPLASTY Left 02/11/2020   Procedure: TOTAL HIP ARTHROPLASTY ANTERIOR APPROACH;  Surgeon: Kathlynn Sharper, MD;  Location: [facility];  Service: Orthopedics;  Laterality: Left;   UMBILICAL HERNIA REPAIR  1983 and 1985    SOCIAL HISTORY: Social History   Socioeconomic History   Marital status: Divorced    Spouse name: Not on file   Number of children: 1   Years of education: Not on file   Highest education level: High school graduate  Occupational History   Not on file  Tobacco Use   Smoking status: Former    Current packs/day: 0.00    Average packs/day: 1 pack/day for 2.0 years (2.0 ttl pk-yrs)    Types: Cigarettes    Start date: 01/07/2015    Quit date: 01/06/2017    Years since quitting: 7.0   Smokeless tobacco: Never  Vaping Use   Vaping status: Former   Quit date: 11/27/2016  Substance and Sexual Activity   Alcohol use: Yes    Comment: rare beer    Drug use: Not Currently   Sexual activity: Not on file  Other Topics Concern   Not on file  Social History Narrative   Home on disability [pysche problems]; transportation issues; worked in chief financial officer. From [place]; moved after separation. Quit smoking; ocassional  alcohol.    Social Drivers of Health   Tobacco Use: Medium Risk (01/20/2024)   Patient History    Smoking Tobacco Use: Former    Smokeless Tobacco Use: Never    Passive Exposure: Not on file  Financial Resource Strain: Medium Risk (12/24/2023)   Received from [facility]   Overall Financial Resource Strain (CARDIA)    Difficulty of Paying Living Expenses: Somewhat hard  Food Insecurity: No Food Insecurity (01/20/2024)   Epic    Worried About Programme Researcher, Broadcasting/film/video in the Last Year: Never true    Ran Out of Food in the Last Year: Never true  Recent Concern: Food Insecurity - Food Insecurity Present (01/16/2024)   Epic    Worried About Programme Researcher, Broadcasting/film/video in the Last Year: Sometimes true    Ran Out of Food in the Last Year: Sometimes true  Transportation Needs: No Transportation Needs (01/20/2024)   Epic    Lack of Transportation (Medical): No    Lack of Transportation (  Non-Medical): No  Physical Activity: Not on file  Stress: Not on file  Social Connections: Not on file  Intimate Partner Violence: Not At Risk (01/20/2024)   Epic    Fear of Current or Ex-Partner: No    Emotionally Abused: No    Physically Abused: No    Sexually Abused: No  Depression (PHQ2-9): Low Risk (01/20/2024)   Depression (PHQ2-9)    PHQ-2 Score: 0  Alcohol Screen: Not on file  Housing: Low Risk (01/20/2024)   Epic    Unable to Pay for Housing in the Last Year: No    Number of Times Moved in the Last Year: 0    Homeless in the Last Year: No  Utilities: Not At Risk (01/20/2024)   Epic    Threatened with loss of utilities: No  Health Literacy: Not on file    FAMILY HISTORY: Family History  Problem Relation Age of Onset   Hypertension Mother    Lung cancer Maternal Grandmother    Heart disease Father    Alcohol abuse Father    Breast cancer Neg Hx    Colon cancer Neg Hx     ALLERGIES:  is allergic to tape.  MEDICATIONS:  Current Outpatient Medications  Medication Sig Dispense  Refill   albuterol  (VENTOLIN  HFA) 108 (90 Base) MCG/ACT inhaler Inhale 2 puffs into the lungs every 6 (six) hours as needed for wheezing or shortness of breath. 8 g 6   atorvastatin (LIPITOR) 20 MG tablet Take 20 mg by mouth at bedtime.     Biotin (SUPER BIOTIN) 5 MG TABS Take by mouth.     DULoxetine  (CYMBALTA ) 30 MG capsule Take 1 capsule (30 mg total) by mouth daily. 90 capsule 1   hydrochlorothiazide (HYDRODIURIL) 25 MG tablet Take 25 mg by mouth daily.      Hydrocortisone, Perianal, 1 % CREA Apply topically.     hydrOXYzine  (VISTARIL ) 50 MG capsule Take 1 capsule (50 mg total) by mouth daily as needed. (Patient taking differently: Take 50 mg by mouth at bedtime.) 90 capsule 1   levothyroxine  (SYNTHROID ) 75 MCG tablet Take 75 mcg by mouth every other day. Alternating with 50 mcg every other day-AM     levothyroxine  (SYNTHROID , LEVOTHROID) 50 MCG tablet Take 50 mcg by mouth every other day. Alternating with 75 mcg every other day-AM     losartan  (COZAAR ) 100 MG tablet Take 100 mg by mouth daily. for high blood pressure     metoprolol  (LOPRESSOR ) 50 MG tablet Take 1 tablet (50 mg total) by mouth 2 (two) times daily. (Patient taking differently: Take 50 mg by mouth daily.) 60 tablet 0   mirtazapine  (REMERON ) 15 MG tablet TAKE 1.5 TABLETS (22.5 MG TOTAL) BY MOUTH AT BEDTIME. 135 tablet 0   Multiple Vitamin (MULTIVITAMIN WITH MINERALS) TABS tablet Take 1 tablet by mouth daily.     NONFORMULARY OR COMPOUNDED ITEM 1 Device by Other route as directed. CPAP-with the pressure setting of 10 cm H20 with heated humidity and ResMed AirFit F20 Full Face Mask , size small. 1 each 0   Omega-3 Fatty Acids (FISH OIL PO) Take 1 capsule by mouth daily.     omeprazole (PRILOSEC) 20 MG capsule Take 20 mg by mouth every morning.     hydrocortisone cream 1 % Apply 1 a small amount to skin twice a day for 14 days (Patient not taking: Reported on 01/20/2024)     No current facility-administered medications for this  visit.    Upper-inner  quadrant-approximately 1 cm hard mass noted under the skin.  Mobile PHYSICAL EXAMINATION: ECOG PERFORMANCE STATUS: 0 - Asymptomatic  Vitals:   01/20/24 1348  BP: (!) 121/92  Pulse: 84  Resp: 16  Temp: 98.4 F (36.9 C)  SpO2: 99%   Filed Weights   01/20/24 1348  Weight: 213 lb 4.8 oz (96.8 kg)    Physical Exam HENT:     Head: Normocephalic and atraumatic.     Mouth/Throat:     Pharynx: No oropharyngeal exudate.  Eyes:     Pupils: Pupils are equal, round, and reactive to light.  Cardiovascular:     Rate and Rhythm: Normal rate and regular rhythm.  Pulmonary:     Effort: Pulmonary effort is normal. No respiratory distress.     Breath sounds: Normal breath sounds. No wheezing.  Abdominal:     General: Bowel sounds are normal. There is no distension.     Palpations: Abdomen is soft. There is no mass.     Tenderness: There is no abdominal tenderness. There is no guarding or rebound.  Musculoskeletal:        General: No tenderness. Normal range of motion.     Cervical back: Normal range of motion and neck supple.  Skin:    General: Skin is warm.     Comments:    Neurological:     Mental Status: She is alert and oriented to person, place, and time.  Psychiatric:        Mood and Affect: Affect normal.      LABORATORY DATA:  I have reviewed the data as listed Lab Results  Component Value Date   WBC 12.1 (H) 02/11/2020   HGB 11.7 (L) 02/11/2020   HCT 34.7 (L) 02/11/2020   MCV 85.0 02/11/2020   PLT 272 02/11/2020   Recent Labs    11/25/23 1733  NA 134*  K 3.6  CL 94*  CO2 29  GLUCOSE 113*  BUN 8  CREATININE 0.88  CALCIUM 10.1  GFRNONAA >60  PROT 7.3  ALBUMIN 4.5  AST 29  ALT 34  ALKPHOS 98  BILITOT 0.7    RADIOGRAPHIC STUDIES: I have personally reviewed the radiological images as listed and agreed with the findings in the report. MR KNEE RIGHT WO CONTRAST Result Date: 01/17/2024 MRI RIGHT KNEE WITHOUT CONTRAST [facility] HISTORY: Right knee pain. TECHNIQUE: Multiplanar, multisequence MR of the right knee was performed without intravenous contrast. COMPARISON: none. FINDINGS: Medial and lateral stabilizers of the knee are intact. There is articular cartilage heterogeneity with the medial femorotibial compartment with small marginal osteophyte formation. No acute fracture or subluxation is seen. Patellofemoral alignment is normal. Articular cartilage thinning and heterogeneity along the mid measures approximately and the medial and lateral patellar facets.There is mild tendinopathy of the distal quadriceps without tear. Patellar tendons. ACL and PCL are intact. There is altered intrasubstance signal within the body of the medial meniscus without definitive extension to an articular surface. There is moderate extrusion of medial meniscal body. Lateral meniscus is intact. There is a small joint effusion.There is a small popliteal cyst. There is mild subcutaneous edema within the anterior soft tissues of the knee. IMPRESSION: 1. Altered intrasubstance signal within the body of the medial meniscus without definitive extension to an articular surface. 2.  Mild medial femorotibial and patellofemoral chondrosis. 3.  Small joint effusion. 4.  Small popliteal cyst. Electronically signed by: [name] Neer MD 01/17/2024 04:19 PM EST RP Workstation: [id number]   US   LT BREAST BX W LOC DEV 1ST LESION IMG BX SPEC US  GUIDE Addendum Date: 01/10/2024 ADDENDUM REPORT: 01/10/2024 13:13 ADDENDUM: PATHOLOGY revealed: Diagnosis Breast, left, needle core biopsy, 10 o'clock, 15 cmfn, 12 mm, hydromark coil clip- INVASIVE MODERATELY DIFFERENTIATED DUCTAL ADENOCARCINOMA, GRADE 2- OVERALL GRADE: 2- ANGIOLYMPHATIC INVASION NOT IDENTIFIED- TUMOR MEASURES 7.5 MM IN GREATEST LINEAR EXTENT Pathology results are CONCORDANT with imaging findings, per Dr. Norleen Croak. Pathology results and recommendations below were discussed with patient by  telephone. Patient reported biopsy site doing well with no adverse symptoms, and only slight tenderness at the site. Post biopsy care instructions were reviewed, questions were answered and my direct phone number was provided. Patient was instructed to call [facility] for any additional questions or concerns related to biopsy site. RECOMMENDATIONS: 1. Surgical and oncological consultation. Request for surgical and oncological consultation was relayed to Shasta Logan, Nurse Navigator at [name]. Pathology results reported by Mliss CHARM Molt RN 01/10/2024. Electronically Signed   By: Norleen Croak M.D.   On: 01/10/2024 13:13   Result Date: 01/10/2024 CLINICAL DATA:  Highly suspicious LEFT breast mass at 10 o'clock EXAM: ULTRASOUND GUIDED LEFT BREAST CORE NEEDLE BIOPSY COMPARISON:  Previous exam(s). PROCEDURE: I met with the patient and we discussed the procedure of ultrasound-guided biopsy, including benefits and alternatives. We discussed the high likelihood of a successful procedure. We discussed the risks of the procedure, including infection, bleeding, tissue injury, clip migration, and inadequate sampling. Informed written consent was given. The usual time-out protocol was performed immediately prior to the procedure. Lesion quadrant: Upper inner Using sterile technique and 1% lidocaine  and 1% lidocaine  with epinephrine  as local anesthetic, under direct ultrasound visualization, a 14 gauge spring-loaded device was used to perform biopsy of the LEFT breast mass at 10 o'clock 15 cm from the nipple using a inferior approach. At the conclusion of the procedure [facility] coil shaped tissue marker clip was deployed into the biopsy cavity. Because the abnormality was never visualized mammographically, post biopsy mammogram was deferred. A sonographically visible clip was placed. IMPRESSION: Ultrasound guided biopsy of the LEFT breast mass at 10 o'clock 15 cm from the nipple. No apparent  complications. Electronically Signed: By: Norleen Croak M.D. On: 01/08/2024 14:06   US  LIMITED ULTRASOUND INCLUDING AXILLA LEFT BREAST  Result Date: 12/30/2023 CLINICAL DATA:  History of LEFT mastectomy with implant reconstruction in 2019. Has a LEFT breast lump x1 month. EXAM: DIGITAL DIAGNOSTIC BILATERAL MAMMOGRAM WITH IMPLANTS, TOMOSYNTHESIS AND CAD; ULTRASOUND RIGHT BREAST LIMITED; ULTRASOUND LEFT BREAST LIMITED TECHNIQUE: Bilateral digital diagnostic mammography and breast tomosynthesis was performed. Standard and/or implant displaced views were performed. The images were evaluated with computer-aided detection. ; Targeted ultrasound examination of the right breast was performed; Targeted ultrasound examination of the left breast was performed. COMPARISON:  Previous exam(s). ACR Breast Density Category b: There are scattered areas of fibroglandular density. FINDINGS: Note that LEFT breast ultrasound was initially performed. Based on the findings of that examination, BILATERAL diagnostic mammogram and RIGHT breast ultrasound were subsequently performed. The mammographic results are reported first below despite being performed after the LEFT breast ultrasound. Diagnostic mammogram: The patient has retropectoral implants. The RIGHT breast implant is stable. The LEFT breast implant has not been previously imaged but is irregular in shape. RIGHT breast mass in the upper inner quadrant which is mammographically unchanged dating back to at least 2022. This mass persists with spot compression imaging. Ultrasound was performed. Otherwise, no mammographic evidence of malignancy in the RIGHT breast.  LEFT breast palpable abnormality is unable to be included in the field of view on full field or spot compression views due to its far superior and MEDIAL positioning. A BB has been placed over several suspected oil cysts seen via ultrasound which was performed prior to the mammogram, with full field and spot-compression  views confirming the presence of benign fat containing oil cysts in the area of concern in the far MEDIAL breast. Postsurgical changes of LEFT breast nipple sparing mastectomy and fat grafting are noted. No suspicious abnormalities are seen via mammography. BILATERAL ultrasound: At the LEFT breast palpable abnormality at 10 o'clock 15 cm from the nipple there is a firm palpable nodule which is tender to palpation. Irregular, spiculated hypoechoic mass measuring 10 x 12 x 11 mm which is taller than wide is present and correlates with the palpable finding. Incidentally noted inferior to the palpable area of concern are several anechoic, circumscribed round and oval oil cysts which correlate with the mammographic findings described above. These are considered benign. Highly irregular appearance of the LEFT breast implant concerning for intracapsular rupture. No definite extracapsular silicone is seen. Representative pictures were taken. A single image of the contralateral RIGHT breast implant was taken to demonstrates significant asymmetry between the sites. No LEFT axillary lymphadenopathy. Targeted ultrasound of the RIGHT breast demonstrates a cluster of benign cysts/microcysts at the 12:30 o'clock position 3 cm from the nipple measuring 7 mm in diameter. This correlates with the mass seen on mammography. IMPRESSION: 1. Highly suspicious LEFT breast mass in the area of palpable concern measuring 12 mm in maximum diameter for which ultrasound-guided biopsy is recommended. There is no evidence of LEFT axillary lymphadenopathy. 2. Status post LEFT mastectomy with implant reconstruction and fat grafting with irregular appearance of the silicone implant on mammography, and sonographic findings concerning for intracapsular rupture. Recommend MRI of the BILATERAL breasts with and without contrast to include implant sequences after completion of the above biopsy. 3. Additional benign postsurgical changes in the LEFT breast  including multiple oil cysts. 4. No mammographic or sonographic evidence of malignancy in the RIGHT breast. Incidental note is made of clustered cysts/microcysts in the RIGHT breast, correlating with the mammographically stable mass. RECOMMENDATION: Ultrasound-guided biopsy of the LEFT breast x1. Following the above biopsy, recommend BILATERAL breast MRI with and without contrast including implant sequences for both cancer staging and implant evaluation. It is important that the indication for this examination include both cancer staging and silicon implant evaluation to ensure appropriate sequences are obtained. I have discussed the findings and recommendations with the patient. The recommended procedure was discussed with the patient and questions were answered. Patient expressed their understanding of the recommendation. Patient will be scheduled for the procedure at her earliest convenience by the schedulers. Ordering provider will be notified. If applicable, a reminder letter will be sent to the patient regarding the next appointment. BI-RADS CATEGORY  5: Highly suggestive of malignancy. Electronically Signed   By: Norleen Croak M.D.   On: 12/30/2023 16:05   MM 3D DIAGNOSTIC MAMMOGRAM BILATERAL BREAST W/IMPLANT Result Date: 12/30/2023 CLINICAL DATA:  History of LEFT mastectomy with implant reconstruction in 2019. Has a LEFT breast lump x1 month. EXAM: DIGITAL DIAGNOSTIC BILATERAL MAMMOGRAM WITH IMPLANTS, TOMOSYNTHESIS AND CAD; ULTRASOUND RIGHT BREAST LIMITED; ULTRASOUND LEFT BREAST LIMITED TECHNIQUE: Bilateral digital diagnostic mammography and breast tomosynthesis was performed. Standard and/or implant displaced views were performed. The images were evaluated with computer-aided detection. ; Targeted ultrasound examination of the right breast was performed; Targeted  ultrasound examination of the left breast was performed. COMPARISON:  Previous exam(s). ACR Breast Density Category b: There are scattered  areas of fibroglandular density. FINDINGS: Note that LEFT breast ultrasound was initially performed. Based on the findings of that examination, BILATERAL diagnostic mammogram and RIGHT breast ultrasound were subsequently performed. The mammographic results are reported first below despite being performed after the LEFT breast ultrasound. Diagnostic mammogram: The patient has retropectoral implants. The RIGHT breast implant is stable. The LEFT breast implant has not been previously imaged but is irregular in shape. RIGHT breast mass in the upper inner quadrant which is mammographically unchanged dating back to at least 2022. This mass persists with spot compression imaging. Ultrasound was performed. Otherwise, no mammographic evidence of malignancy in the RIGHT breast. LEFT breast palpable abnormality is unable to be included in the field of view on full field or spot compression views due to its far superior and MEDIAL positioning. A BB has been placed over several suspected oil cysts seen via ultrasound which was performed prior to the mammogram, with full field and spot-compression views confirming the presence of benign fat containing oil cysts in the area of concern in the far MEDIAL breast. Postsurgical changes of LEFT breast nipple sparing mastectomy and fat grafting are noted. No suspicious abnormalities are seen via mammography. BILATERAL ultrasound: At the LEFT breast palpable abnormality at 10 o'clock 15 cm from the nipple there is a firm palpable nodule which is tender to palpation. Irregular, spiculated hypoechoic mass measuring 10 x 12 x 11 mm which is taller than wide is present and correlates with the palpable finding. Incidentally noted inferior to the palpable area of concern are several anechoic, circumscribed round and oval oil cysts which correlate with the mammographic findings described above. These are considered benign. Highly irregular appearance of the LEFT breast implant concerning for  intracapsular rupture. No definite extracapsular silicone is seen. Representative pictures were taken. A single image of the contralateral RIGHT breast implant was taken to demonstrates significant asymmetry between the sites. No LEFT axillary lymphadenopathy. Targeted ultrasound of the RIGHT breast demonstrates a cluster of benign cysts/microcysts at the 12:30 o'clock position 3 cm from the nipple measuring 7 mm in diameter. This correlates with the mass seen on mammography. IMPRESSION: 1. Highly suspicious LEFT breast mass in the area of palpable concern measuring 12 mm in maximum diameter for which ultrasound-guided biopsy is recommended. There is no evidence of LEFT axillary lymphadenopathy. 2. Status post LEFT mastectomy with implant reconstruction and fat grafting with irregular appearance of the silicone implant on mammography, and sonographic findings concerning for intracapsular rupture. Recommend MRI of the BILATERAL breasts with and without contrast to include implant sequences after completion of the above biopsy. 3. Additional benign postsurgical changes in the LEFT breast including multiple oil cysts. 4. No mammographic or sonographic evidence of malignancy in the RIGHT breast. Incidental note is made of clustered cysts/microcysts in the RIGHT breast, correlating with the mammographically stable mass. RECOMMENDATION: Ultrasound-guided biopsy of the LEFT breast x1. Following the above biopsy, recommend BILATERAL breast MRI with and without contrast including implant sequences for both cancer staging and implant evaluation. It is important that the indication for this examination include both cancer staging and silicon implant evaluation to ensure appropriate sequences are obtained. I have discussed the findings and recommendations with the patient. The recommended procedure was discussed with the patient and questions were answered. Patient expressed their understanding of the recommendation. Patient  will be scheduled for the procedure at her  earliest convenience by the schedulers. Ordering provider will be notified. If applicable, a reminder letter will be sent to the patient regarding the next appointment. BI-RADS CATEGORY  5: Highly suggestive of malignancy. Electronically Signed   By: Norleen Croak M.D.   On: 12/30/2023 16:05   US  LIMITED ULTRASOUND INCLUDING AXILLA RIGHT BREAST Result Date: 12/30/2023 CLINICAL DATA:  History of LEFT mastectomy with implant reconstruction in 2019. Has a LEFT breast lump x1 month. EXAM: DIGITAL DIAGNOSTIC BILATERAL MAMMOGRAM WITH IMPLANTS, TOMOSYNTHESIS AND CAD; ULTRASOUND RIGHT BREAST LIMITED; ULTRASOUND LEFT BREAST LIMITED TECHNIQUE: Bilateral digital diagnostic mammography and breast tomosynthesis was performed. Standard and/or implant displaced views were performed. The images were evaluated with computer-aided detection. ; Targeted ultrasound examination of the right breast was performed; Targeted ultrasound examination of the left breast was performed. COMPARISON:  Previous exam(s). ACR Breast Density Category b: There are scattered areas of fibroglandular density. FINDINGS: Note that LEFT breast ultrasound was initially performed. Based on the findings of that examination, BILATERAL diagnostic mammogram and RIGHT breast ultrasound were subsequently performed. The mammographic results are reported first below despite being performed after the LEFT breast ultrasound. Diagnostic mammogram: The patient has retropectoral implants. The RIGHT breast implant is stable. The LEFT breast implant has not been previously imaged but is irregular in shape. RIGHT breast mass in the upper inner quadrant which is mammographically unchanged dating back to at least 2022. This mass persists with spot compression imaging. Ultrasound was performed. Otherwise, no mammographic evidence of malignancy in the RIGHT breast. LEFT breast palpable abnormality is unable to be included in the field  of view on full field or spot compression views due to its far superior and MEDIAL positioning. A BB has been placed over several suspected oil cysts seen via ultrasound which was performed prior to the mammogram, with full field and spot-compression views confirming the presence of benign fat containing oil cysts in the area of concern in the far MEDIAL breast. Postsurgical changes of LEFT breast nipple sparing mastectomy and fat grafting are noted. No suspicious abnormalities are seen via mammography. BILATERAL ultrasound: At the LEFT breast palpable abnormality at 10 o'clock 15 cm from the nipple there is a firm palpable nodule which is tender to palpation. Irregular, spiculated hypoechoic mass measuring 10 x 12 x 11 mm which is taller than wide is present and correlates with the palpable finding. Incidentally noted inferior to the palpable area of concern are several anechoic, circumscribed round and oval oil cysts which correlate with the mammographic findings described above. These are considered benign. Highly irregular appearance of the LEFT breast implant concerning for intracapsular rupture. No definite extracapsular silicone is seen. Representative pictures were taken. A single image of the contralateral RIGHT breast implant was taken to demonstrates significant asymmetry between the sites. No LEFT axillary lymphadenopathy. Targeted ultrasound of the RIGHT breast demonstrates a cluster of benign cysts/microcysts at the 12:30 o'clock position 3 cm from the nipple measuring 7 mm in diameter. This correlates with the mass seen on mammography. IMPRESSION: 1. Highly suspicious LEFT breast mass in the area of palpable concern measuring 12 mm in maximum diameter for which ultrasound-guided biopsy is recommended. There is no evidence of LEFT axillary lymphadenopathy. 2. Status post LEFT mastectomy with implant reconstruction and fat grafting with irregular appearance of the silicone implant on mammography, and  sonographic findings concerning for intracapsular rupture. Recommend MRI of the BILATERAL breasts with and without contrast to include implant sequences after completion of the above biopsy. 3. Additional benign  postsurgical changes in the LEFT breast including multiple oil cysts. 4. No mammographic or sonographic evidence of malignancy in the RIGHT breast. Incidental note is made of clustered cysts/microcysts in the RIGHT breast, correlating with the mammographically stable mass. RECOMMENDATION: Ultrasound-guided biopsy of the LEFT breast x1. Following the above biopsy, recommend BILATERAL breast MRI with and without contrast including implant sequences for both cancer staging and implant evaluation. It is important that the indication for this examination include both cancer staging and silicon implant evaluation to ensure appropriate sequences are obtained. I have discussed the findings and recommendations with the patient. The recommended procedure was discussed with the patient and questions were answered. Patient expressed their understanding of the recommendation. Patient will be scheduled for the procedure at her earliest convenience by the schedulers. Ordering provider will be notified. If applicable, a reminder letter will be sent to the patient regarding the next appointment. BI-RADS CATEGORY  5: Highly suggestive of malignancy. Electronically Signed   By: Norleen Croak M.D.   On: 12/30/2023 16:05     ASSESSMENT & PLAN:   Malignant neoplasm of upper-outer quadrant of left breast in female, estrogen receptor positive (HCC) # 2020- Stage I left breast IMC; pT1b [6 mm] ER-Pos; PR-neg; Her -s/p mastectomy; no radiation.  Patient declined letrozole  after 2 months because of intolerance [hot flashes extreme fatigue postcoital pain; anxiety episodes etc.] at the time she declined any other endocrine therapy  #  DEC 2025- [palpable lump]-  Highly suspicious LEFT breast mass in the area of palpable concern  measuring 12 mm in maximum diameter . There is no evidence of LEFT axillary lymphadenopathy. Ultrasound-guided biopsy-ER-Pos; PR-neg; Her 2 neu-   # Suspect local recurrence vs-new primary and residual breast tissue [s/p left mastectomy 2020]-similar hormone receptor profile.  Recommend surgical excision; and consideration of local radiation postsurgery.  Low clinical concerns for metastatic disease.  However it is reasonable to evaluate with a scan to evaluate for distant metastatic disease.  I left a message for Dr. Marinda to discuss the treatment plan.   # Patient will need endocrine therapy; and also consider CDK inhibitor- given local recurrence.    # 2020 based on mammogram/ultrasound 2026 January -LEFT mastectomy with implant reconstruction- intracapsular rupture.  Will consider MRI of the BILATERAL breasts with and without contrast to include implant sequences-Will discuss with surgery/plastics  Thank you Ms.Debarah FNP. for allowing me to participate in the care of your pleasant patient. Please do not hesitate to contact me with questions or concerns in the interim.  Discussed with Kelli Logan.  # 60 minutes face-to-face with the patient discussing the above plan of care; more than 50% of time spent on prognosis/ natural history; counseling and coordination.   # DISPOSITION:  # follow up TBD--Dr.B   All questions were answered. The patient knows to call the clinic with any problems, questions or concerns.    Cindy JONELLE Joe, MD 01/20/2024 2:44 PM

## 2024-01-20 NOTE — Progress Notes (Signed)
 She was told her lt implant broke and she feels like she is getting slammed in the chest at times.

## 2024-01-21 ENCOUNTER — Encounter: Payer: Self-pay | Admitting: *Deleted

## 2024-01-21 NOTE — Progress Notes (Signed)
 Appointments given for  PET scan and MRI.

## 2024-01-23 ENCOUNTER — Ambulatory Visit
Admission: RE | Admit: 2024-01-23 | Discharge: 2024-01-23 | Disposition: A | Discharge status: Home / Self Care | Source: Ambulatory Visit | Attending: Internal Medicine | Admitting: Internal Medicine

## 2024-01-23 DIAGNOSIS — Z170 Estrogen receptor positive status [ER+]: Secondary | ICD-10-CM | POA: Insufficient documentation

## 2024-01-23 DIAGNOSIS — C50412 Malignant neoplasm of upper-outer quadrant of left female breast: Secondary | ICD-10-CM | POA: Diagnosis present

## 2024-01-23 MED ORDER — GADOBUTROL 1 MMOL/ML IV SOLN
9.0000 mL | Freq: Once | INTRAVENOUS | Status: AC | PRN
  Administered 2024-01-23: 9 mL via INTRAVENOUS

## 2024-01-27 ENCOUNTER — Ambulatory Visit
Admission: RE | Admit: 2024-01-27 | Discharge: 2024-01-27 | Disposition: A | Discharge status: Home / Self Care | Source: Ambulatory Visit | Attending: Internal Medicine | Admitting: Internal Medicine

## 2024-01-27 DIAGNOSIS — C50412 Malignant neoplasm of upper-outer quadrant of left female breast: Secondary | ICD-10-CM

## 2024-01-27 DIAGNOSIS — E119 Type 2 diabetes mellitus without complications: Secondary | ICD-10-CM | POA: Diagnosis not present

## 2024-01-27 DIAGNOSIS — C50912 Malignant neoplasm of unspecified site of left female breast: Secondary | ICD-10-CM | POA: Insufficient documentation

## 2024-01-27 LAB — GLUCOSE, CAPILLARY: Glucose-Capillary: 102 mg/dL — ABNORMAL HIGH (ref 70–99)

## 2024-01-27 MED ORDER — FLUDEOXYGLUCOSE F - 18 (FDG) INJECTION
11.0000 | Freq: Once | INTRAVENOUS | Status: AC | PRN
  Administered 2024-01-27: 11.2 via INTRAVENOUS

## 2024-01-29 ENCOUNTER — Encounter: Payer: Self-pay | Admitting: Psychiatry

## 2024-01-29 ENCOUNTER — Telehealth: Admitting: Psychiatry

## 2024-01-29 DIAGNOSIS — F3342 Major depressive disorder, recurrent, in full remission: Secondary | ICD-10-CM

## 2024-01-29 DIAGNOSIS — F4010 Social phobia, unspecified: Secondary | ICD-10-CM | POA: Diagnosis not present

## 2024-01-29 DIAGNOSIS — G4701 Insomnia due to medical condition: Secondary | ICD-10-CM

## 2024-01-29 DIAGNOSIS — F424 Excoriation (skin-picking) disorder: Secondary | ICD-10-CM

## 2024-01-29 DIAGNOSIS — F410 Panic disorder [episodic paroxysmal anxiety] without agoraphobia: Secondary | ICD-10-CM | POA: Diagnosis not present

## 2024-01-29 DIAGNOSIS — F4310 Post-traumatic stress disorder, unspecified: Secondary | ICD-10-CM | POA: Diagnosis not present

## 2024-01-29 NOTE — Progress Notes (Unsigned)
 Virtual Visit via Video Note  I connected with Kelli Logan on 01/29/24 at  3:00 PM EST by a video enabled telemedicine application and verified that I am speaking with the correct person using two identifiers.  Location Provider Location : [facility] Patient Location : Home  Participants: Patient , Provider    I discussed the limitations of evaluation and management by telemedicine and the availability of in person appointments. The patient expressed understanding and agreed to proceed   I discussed the assessment and treatment plan with the patient. The patient was provided an opportunity to ask questions and all were answered. The patient agreed with the plan and demonstrated an understanding of the instructions.   The patient was advised to call back or seek an in-person evaluation if the symptoms worsen or if the condition fails to improve as anticipated.                                                                                                                        BH MD OP Progress Note  01/29/2024 3:10 PM Kelli Logan  MRN:  [id number]  Chief Complaint:  Chief Complaint  Patient presents with   Follow-up   Anxiety   Depression   Medication Refill   Insomnia   Discussed the use of AI scribe software for clinical note transcription with the patient, who gave verbal consent to proceed.  History of Present Illness Kelli Logan is a 64 year old Caucasian female, divorced, lives in [place], has a history of PTSD, skin picking disorder, MDD, panic disorder, social anxiety disorder, obstructive sleep apnea on CPAP, history of borderline personality disorder, fibromyalgia, total hip replacement per history, breast cancer was evaluated by telemedicine today for a follow-up appointment.  She describes feeling overwhelmed due to a recurrence of breast cancer, the need for additional surgery. Significant anxiety, particularly in the context of multiple recent medical  procedures such as MRIs, PET scans, and ongoing evaluations for her cancer and orthopedic issues, affects her daily life. The stress from health concerns, housing inspections, and financial burdens from medical bills has contributed to increased anxiety in recent days.  She denies any significant depression symptoms other than feeling sad and overwhelmed about her current situation.  She does have a history of PTSD and is not interested in discussing her past trauma.  She currently denies any intrusive memories or flashbacks.  She expresses that talking to a therapist can increase her anxiety, especially when the focus is on past events, and she prefers to address current stressors. She reports that going out of the house daily serves as a coping mechanism to distract herself from worries until she returns home.   She reports that pain from her knee, which she was told is due to a torn meniscus and osteoarthritis, disrupts her sleep. She describes waking up in pain, especially when her affected knee is positioned on top of the other, and notes that the pain can become intense even when  she is at rest. She reports that using a travel pillow between her knees at night does not alleviate the discomfort.  She has upcoming appointment with her provider next week and plans to discuss this.  She denies any thoughts of hurting herself or others.  She continues to have skin picking, which is chronic and is related to certain kind of food intake.  She is currently compliant on the Cymbalta , mirtazapine , denies side effects.   Visit Diagnosis:    ICD-10-CM   1. PTSD (post-traumatic stress disorder)  F43.10     2. Skin-picking disorder  F42.4     3. Recurrent major depressive disorder, in full remission  F33.42     4. Panic disorder  F41.0     5. Social anxiety disorder  F40.10     6. Insomnia due to medical condition  G47.01    PTSD, obstructive sleep apnea on CPAP, pain, situational stressors including  current living situation      Past Psychiatric History: I have reviewed past psychiatric history from progress note on 03/10/2018.  Past trials of medications multiple including BuSpar , gabapentin .  Past Medical History:  Past Medical History:  Diagnosis Date   Anxiety    Arthritis    Cancer (HCC)    breast left   Depression    Fibromyalgia    GERD (gastroesophageal reflux disease)    Headache    History of kidney stones    h/o   Hypertension    Hypothyroidism    Sleep apnea    DOES NOT USE CPAP   Thyroid disease     Past Surgical History:  Procedure Laterality Date   APPENDECTOMY     BREAST BIOPSY Left few yrs ago   stereotactic bx in [place], benign   BREAST BIOPSY Left 12/26/2017   Affirm Bx [facility]- X-Clip   BREAST BIOPSY Left 01/14/2018   positive- pt had mastectomy   BREAST BIOPSY Left 01/08/2024   US  LT BREAST BX W LOC DEV 1ST LESION IMG BX SPEC US  GUIDE 01/08/2024 [facility]   BREAST IMPLANT EXCHANGE Right 02/16/2021   Procedure: PLACEMENT OF BREAST IMPLANT;  Surgeon: Lowery Estefana RAMAN, DO;  Location: [facility];  Service: Plastics;  Laterality: Right;  1.5 hour   BREAST RECONSTRUCTION WITH PLACEMENT OF TISSUE EXPANDER AND ALLODERM Left 04/08/2018   Procedure: LEFT BREAST IMMEDIATE  RECONSTRUCTION WITH EXPANDER AND FLEX HD;  Surgeon: Lowery Estefana RAMAN, DO;  Location: [facility];  Service: Plastics;  Laterality: Left;   COLONOSCOPY WITH PROPOFOL  N/A 06/26/2018   Procedure: COLONOSCOPY WITH PROPOFOL ;  Surgeon: Janalyn Keene NOVAK, MD;  Location: [facility];  Service: Endoscopy;  Laterality: N/A;   DRUG INDUCED ENDOSCOPY Right 02/27/2022   Procedure: DRUG INDUCED ENDOSCOPY;  Surgeon: Carlie Clark, MD;  Location: [facility];  Service: ENT;  Laterality: Right;   ESOPHAGOGASTRODUODENOSCOPY (EGD) WITH PROPOFOL  N/A 06/26/2018   Procedure: ESOPHAGOGASTRODUODENOSCOPY (EGD) WITH PROPOFOL ;  Surgeon: Janalyn Keene NOVAK, MD;  Location: [facility];  Service: Endoscopy;  Laterality: N/A;   EYE SURGERY Bilateral    lasik   FOOT BONE EXCISION     IMAGE GUIDED SINUS SURGERY  10/220   LIPOSUCTION Bilateral 02/16/2021   Procedure: LIPOSUCTION OF BILATERAL BREAST;  Surgeon: Lowery Estefana RAMAN, DO;  Location: [facility];  Service: Plastics;  Laterality: Bilateral;   LIPOSUCTION WITH LIPOFILLING Left 04/30/2019   Procedure: Fat grafting to left breast;  Surgeon: Lowery Estefana RAMAN, DO;  Location: [facility];  Service: Government Social Research Officer;  Laterality: Left;  90 min, please   MASTECTOMY Left 04/08/2019   MASTECTOMY W/ SENTINEL NODE BIOPSY Left 04/08/2018   Procedure: MASTECTOMY WITH SENTINEL LYMPH NODE BIOPSY LEFT;  Surgeon: Marolyn Nest, MD;  Location: [facility];  Service: General;  Laterality: Left;   MASTOPEXY Right 08/04/2018   Procedure: MASTOPEXY;  Surgeon: Lowery Estefana RAMAN, DO;  Location: [facility];  Service: Plastics;  Laterality: Right;  TOTAL CASE TIME SHOULD BE 3 HOURS, PLEASE   REMOVAL OF TISSUE EXPANDER AND PLACEMENT OF IMPLANT Left 08/04/2018   Procedure: REMOVAL OF TISSUE EXPANDER AND PLACEMENT OF IMPLANT;  Surgeon: Lowery Estefana RAMAN, DO;  Location: [facility];  Service: Plastics;  Laterality: Left;   SCAR REVISION Left 04/30/2019   Procedure: release of scar contracture to left breast;  Surgeon: Lowery Estefana RAMAN, DO;  Location: [facility];  Service: Plastics;  Laterality: Left;   TOTAL HIP ARTHROPLASTY Left 02/11/2020   Procedure: TOTAL HIP ARTHROPLASTY ANTERIOR APPROACH;  Surgeon: Kathlynn Sharper, MD;  Location: [facility];  Service: Orthopedics;  Laterality: Left;   UMBILICAL HERNIA REPAIR  1983 and 1985    Family Psychiatric History: I have reviewed family psychiatric history from progress note on 03/10/2018.  Family History:  Family History  Problem Relation Age of Onset   Hypertension Mother    Lung cancer Maternal Grandmother    Heart disease Father    Alcohol  abuse Father    Breast cancer Neg Hx    Colon cancer Neg Hx     Social History: I have reviewed social history from progress note on 03/10/2018. Social History   Socioeconomic History   Marital status: Divorced    Spouse name: Not on file   Number of children: 1   Years of education: Not on file   Highest education level: High school graduate  Occupational History   Not on file  Tobacco Use   Smoking status: Former    Current packs/day: 0.00    Average packs/day: 1 pack/day for 2.0 years (2.0 ttl pk-yrs)    Types: Cigarettes    Start date: 01/07/2015    Quit date: 01/06/2017    Years since quitting: 7.0   Smokeless tobacco: Never  Vaping Use   Vaping status: Former   Quit date: 11/27/2016  Substance and Sexual Activity   Alcohol use: Yes    Comment: rare beer    Drug use: Not Currently   Sexual activity: Not on file  Other Topics Concern   Not on file  Social History Narrative   Home on disability [pysche problems]; transportation issues; worked in chief financial officer. From [place]; moved after separation. Quit smoking; ocassional alcohol.    Social Drivers of Health   Tobacco Use: Medium Risk (01/29/2024)   Patient History    Smoking Tobacco Use: Former    Smokeless Tobacco Use: Never    Passive Exposure: Not on file  Financial Resource Strain: Medium Risk (12/24/2023)   Received from [facility]   Overall Financial Resource Strain (CARDIA)    Difficulty of Paying Living Expenses: Somewhat hard  Food Insecurity: No Food Insecurity (01/20/2024)   Epic    Worried About Programme Researcher, Broadcasting/film/video in the Last Year: Never true    Ran Out of Food in the Last Year: Never true  Recent Concern: Food Insecurity - Food Insecurity Present (01/16/2024)   Epic    Worried About Radiation Protection Practitioner of Food in the Last Year: Sometimes true  Ran Out of Food in the Last Year: Sometimes true  Transportation Needs: No Transportation Needs (01/20/2024)   Epic    Lack of Transportation  (Medical): No    Lack of Transportation (Non-Medical): No  Physical Activity: Not on file  Stress: Not on file  Social Connections: Not on file  Depression (PHQ2-9): Low Risk (01/20/2024)   Depression (PHQ2-9)    PHQ-2 Score: 0  Alcohol Screen: Not on file  Housing: Low Risk (01/20/2024)   Epic    Unable to Pay for Housing in the Last Year: No    Number of Times Moved in the Last Year: 0    Homeless in the Last Year: No  Utilities: Not At Risk (01/20/2024)   Epic    Threatened with loss of utilities: No  Health Literacy: Not on file    Allergies: Allergies[1]  Metabolic Disorder Labs: No results found for: HGBA1C, MPG No results found for: PROLACTIN No results found for: CHOL, TRIG, HDL, CHOLHDL, VLDL, LDLCALC No results found for: TSH  Therapeutic Level Labs: No results found for: LITHIUM No results found for: VALPROATE No results found for: CBMZ  Current Medications: Current Outpatient Medications  Medication Sig Dispense Refill   albuterol  (VENTOLIN  HFA) 108 (90 Base) MCG/ACT inhaler Inhale 2 puffs into the lungs every 6 (six) hours as needed for wheezing or shortness of breath. 8 g 6   atorvastatin (LIPITOR) 20 MG tablet Take 20 mg by mouth at bedtime.     Biotin (SUPER BIOTIN) 5 MG TABS Take by mouth.     DULoxetine  (CYMBALTA ) 30 MG capsule Take 1 capsule (30 mg total) by mouth daily. 90 capsule 1   hydrochlorothiazide (HYDRODIURIL) 25 MG tablet Take 25 mg by mouth daily.      hydrocortisone cream 1 % Apply 1 a small amount to skin twice a day for 14 days (Patient not taking: Reported on 01/20/2024)     Hydrocortisone, Perianal, 1 % CREA Apply topically.     hydrOXYzine  (VISTARIL ) 50 MG capsule Take 1 capsule (50 mg total) by mouth daily as needed. (Patient taking differently: Take 50 mg by mouth at bedtime.) 90 capsule 1   levothyroxine  (SYNTHROID ) 75 MCG tablet Take 75 mcg by mouth every other day. Alternating with 50 mcg every other day-AM      levothyroxine  (SYNTHROID , LEVOTHROID) 50 MCG tablet Take 50 mcg by mouth every other day. Alternating with 75 mcg every other day-AM     losartan  (COZAAR ) 100 MG tablet Take 100 mg by mouth daily. for high blood pressure     metoprolol  (LOPRESSOR ) 50 MG tablet Take 1 tablet (50 mg total) by mouth 2 (two) times daily. (Patient taking differently: Take 50 mg by mouth daily.) 60 tablet 0   mirtazapine  (REMERON ) 15 MG tablet TAKE 1.5 TABLETS (22.5 MG TOTAL) BY MOUTH AT BEDTIME. 135 tablet 0   Multiple Vitamin (MULTIVITAMIN WITH MINERALS) TABS tablet Take 1 tablet by mouth daily.     NONFORMULARY OR COMPOUNDED ITEM 1 Device by Other route as directed. CPAP-with the pressure setting of 10 cm H20 with heated humidity and ResMed AirFit F20 Full Face Mask , size small. 1 each 0   Omega-3 Fatty Acids (FISH OIL PO) Take 1 capsule by mouth daily.     omeprazole (PRILOSEC) 20 MG capsule Take 20 mg by mouth every morning.     No current facility-administered medications for this visit.     Musculoskeletal: Strength & Muscle Tone: UTA Gait & Station: Seated  Patient leans: N/A  Psychiatric Specialty Exam: Review of Systems  Psychiatric/Behavioral:  Positive for sleep disturbance. The patient is nervous/anxious.     Last menstrual period 02/05/2016.There is no height or weight on file to calculate BMI.  General Appearance: Casual  Eye Contact:  Fair  Speech:  Normal Rate  Volume:  Normal  Mood:  Anxious  Affect:  Appropriate  Thought Process:  Goal Directed and Descriptions of Associations: Intact  Orientation:  Full (Time, Place, and Person)  Thought Content: Logical   Suicidal Thoughts:  No  Homicidal Thoughts:  No  Memory:  Immediate;   Fair Recent;   Fair Remote;   Fair  Judgement:  Fair  Insight:  Fair  Psychomotor Activity:  Normal  Concentration:  Concentration: Fair and Attention Span: Fair  Recall:  [employer] of Knowledge: Fair  Language: Fair  Akathisia:  No  Handed:  Right   AIMS (if indicated): not done  Assets:  Communication Skills Desire for Improvement Housing Social Support  ADL's:  Intact  Cognition: WNL  Sleep:  varies has severe pain of knee   Screenings: AIMS    Flowsheet Row Video Visit from 06/22/2021 in [facility] Video Visit from 05/22/2021 in [facility]  AIMS Total Score 0 0   GAD-7    Flowsheet Row Office Visit from 11/21/2023 in [facility] Office Visit from 11/13/2022 in [facility] Office Visit from 01/17/2022 in [facility] Video Visit from 11/16/2021 in [facility] Video Visit from 09/28/2021 in [facility]  Total GAD-7 Score 19 19 14 9 14    PHQ2-9    Flowsheet Row Office Visit from 01/20/2024 in [facility] [place] Office Visit from 11/25/2023 in [facility] Visit from 11/21/2023 in [facility] Office Visit from 11/13/2022 in [facility] Office Visit from 01/17/2022 in [facility]  PHQ-2 Total Score 0 0 0 1 1  PHQ-9 Total Score -- -- -- -- 5   Flowsheet Row Video Visit from 01/29/2024 in [facility] Video Visit from 12/31/2023 in [facility] Office Visit from 11/21/2023 in [facility]  C-SSRS RISK CATEGORY Moderate Risk Moderate Risk No Risk     Assessment and Plan: Kelli Logan is a 64 year old Caucasian female who presented for a follow-up appointment, discussed assessment and plan as noted  below.  1. PTSD (post-traumatic stress disorder)-improving Ongoing anxiety symptoms mostly due to situational stressors.  Plan was to start Lyrica however with the current diagnosis of cancer and upcoming treatment we will hold off.  Patient referred for psychotherapy sessions to in-house therapist. Encouraged to start CBT Continue Mirtazapine  22.5 mg daily at bedtime Continue Cymbalta  30 mg daily  2. Skin-picking disorder-improving Does have episodic flareups Referred for CBT with in-house therapist Continue Mirtazapine  as prescribed.  3. Recurrent major depressive disorder, in full remission Currently denies any significant depression symptoms Continue Cymbalta  and Mirtazapine  as prescribed  4. Panic disorder-unstable Ongoing anxiety mostly due to situational challenges current diagnosis of cancer, upcoming treatment and procedures. Referred for CBT with in-house therapist. Continue Cymbalta  and Mirtazapine  as prescribed.  5. Social  anxiety disorder-improving Currently reports improvement in social anxiety Referred for CBT  6. Insomnia due to medical condition-unstable Sleep problems due to current pain Will need sufficient pain management Continue Mirtazapine  as prescribed.   Follow-up Follow-up in clinic in 5 to 6 months or sooner if needed.  Patient in the meantime to pursue frequent psychotherapy sessions. Collaboration of Care: Collaboration of Care: Referral or follow-up with counselor/therapist [facility] referred patient to in-house therapist.  I have communicated with staff and coordinated care with therapist.  Patient/Guardian was advised Release of Information must be obtained prior to any record release in order to collaborate their care with an outside provider. Patient/Guardian was advised if they have not already done so to contact the registration department to sign all necessary forms in order for us  to release information regarding their care.   Consent:  Patient/Guardian gives verbal consent for treatment and assignment of benefits for services provided during this visit. Patient/Guardian expressed understanding and agreed to proceed.   This note was generated in part or whole with voice recognition software. Voice recognition is usually quite accurate but there are transcription errors that can and very often do occur. I apologize for any typographical errors that were not detected and corrected.    Kelli Brizzi, MD 01/30/2024, 2:06 PM     [1]  Allergies Allergen Reactions   Tape Other (See Comments)    Steri strips after breast surgery/Burned skin   Tegaderm and other tape OK

## 2024-01-30 ENCOUNTER — Ambulatory Visit: Admitting: General Surgery

## 2024-01-30 ENCOUNTER — Telehealth: Payer: Self-pay | Admitting: General Surgery

## 2024-01-30 ENCOUNTER — Encounter: Payer: Self-pay | Admitting: General Surgery

## 2024-01-30 ENCOUNTER — Ambulatory Visit: Payer: Self-pay | Admitting: Internal Medicine

## 2024-01-30 VITALS — BP 161/99 | HR 72 | Temp 98.4°F | Ht 65.25 in | Wt 217.2 lb

## 2024-01-30 DIAGNOSIS — Z170 Estrogen receptor positive status [ER+]: Secondary | ICD-10-CM

## 2024-01-30 DIAGNOSIS — C50412 Malignant neoplasm of upper-outer quadrant of left female breast: Secondary | ICD-10-CM

## 2024-01-30 NOTE — Telephone Encounter (Signed)
 I spoke to patient regarding the results of the PET scan-no evidence of any distant metastatic disease.  Recommend proceeding with surgery upfront.   Patient has appointment with Dr. Marinda this afternoon.   Ana-please have the patient follow-up in 1 week or so after the surgery.   GB  FYI-Dr.Dennis.

## 2024-01-30 NOTE — Telephone Encounter (Signed)
 Patient has been advised of Pre-Admission date/time, and Surgery date at [facility]  Surgery Date: 02/07/24 Preadmission Testing Date: Preadmissions to call patient.   Patient informed of the scheduling process and surgery information given at time of office visit.   Patient has been made aware to call [phone], between 1-3:00pm the day before surgery, to find out what time to arrive for surgery.

## 2024-01-30 NOTE — Patient Instructions (Signed)
 We have spoken today about removing a lump in your breast. This will be done by Dr. Marinda at [facility].  If you are on any injectable weight loss medication, you will need to stop taking your GLP-1 injectable (weight loss) medications 8 days before your surgery to avoid any complications with anesthesia.   You will most likely be able to leave the hospital several hours after your surgery. Rarely, a patient needs to stay over night but this is a possibility.  Plan to tentatively be off work for 1-2 weeks following the surgery and may return with approximately 2 more weeks of a lifting restriction, no greater than 15 lbs.  Please see your Blue surgery sheet for more information. Our surgery scheduler will call you to look at surgery dates and to go over information.   If you have FMLA or Disability paperwork that needs to be filled out, please have your company fax your paperwork to [phone] or you may drop this by either office. This paperwork will be filled out within 3 days after your surgery has been completed.    Lumpectomy A lumpectomy is a form of breast conserving or breast preservation surgery. It may also be referred to as a partial mastectomy. During a lumpectomy, the portion of the breast that contains the cancerous tumor or breast mass (the lump) is removed. Some normal tissue around the lump may also be removed to make sure all of the tumor has been removed.  LET [facility] CARE PROVIDER KNOW ABOUT: Any allergies you have. All medicines you are taking, including vitamins, herbs, eye drops, creams, and over-the-counter medicines. Previous problems you or members of your family have had with the use of anesthetics. Any blood disorders you have. Previous surgeries you have had. Medical conditions you have. RISKS AND COMPLICATIONS Generally, this is a safe procedure. However, problems can occur and include: Bleeding. Infection. Pain. Temporary swelling. Change in the  shape of the breast, particularly if a large portion is removed. BEFORE THE PROCEDURE Ask your health care provider about changing or stopping your regular medicines. This is especially important if you are taking diabetes medicines or blood thinners. Do not eat or drink anything after midnight on the night before the procedure or as directed by your health care provider. Ask your health care provider if you can take a sip of water with any approved medicines. On the day of surgery, your health care provider will use a mammogram or ultrasound to locate and mark the tumor in your breast. These markings on your breast will show where the cut (incision) will be made.   PROCEDURE  An IV tube will be put into one of your veins. You may be given medicine to help you relax before the surgery (sedative). You will be given one of the following: A medicine that numbs the area (local anesthetic). A medicine that makes you fall asleep (general anesthetic). Your health care provider will use a kind of electric scalpel that uses heat to minimize bleeding (electrocautery knife). A curved incision (like a smile or frown) that follows the natural curve of your breast is made, to allow for minimal scarring and better healing. The tumor will be removed with some of the surrounding tissue. This will be sent to the lab for analysis. Your health care provider may also remove your lymph nodes at this time if needed. Sometimes, but not always, a rubber tube called a drain will be surgically inserted into your breast area or  armpit to collect excess fluid that may accumulate in the space where the tumor was. This drain is connected to a plastic bulb on the outside of your body. This drain creates suction to help remove the fluid. The incisions will be closed with stitches (sutures). A bandage may be placed over the incisions. AFTER THE PROCEDURE You will be taken to the recovery area. You will be given medicine for  pain. A small rubber drain may be placed in the breast for 2-3 days to prevent a collection of blood (hematoma) from developing in the breast. You will be given instructions on caring for the drain before you go home. A pressure bandage (dressing) will be applied for 1-2 days to prevent bleeding. Ask your health care provider how to care for your bandage at home.   This information is not intended to replace advice given to you by your health care provider. Make sure you discuss any questions you have with your health care provider.   Document Released: 02/05/2006 Document Revised: 01/15/2014 Document Reviewed: 05/30/2012 Elsevier Interactive Patient Education [employer]

## 2024-01-31 ENCOUNTER — Encounter: Payer: Self-pay | Admitting: *Deleted

## 2024-01-31 NOTE — Progress Notes (Signed)
 Lumpectomy is scheduled for 1/30, she will see Dr. Rennie on 2/11 at 3:00.

## 2024-02-04 ENCOUNTER — Other Ambulatory Visit: Payer: Self-pay

## 2024-02-04 ENCOUNTER — Encounter
Admission: RE | Admit: 2024-02-04 | Discharge: 2024-02-04 | Disposition: A | Discharge status: Home / Self Care | Source: Ambulatory Visit | Attending: General Surgery | Admitting: General Surgery

## 2024-02-04 ENCOUNTER — Ambulatory Visit: Payer: Self-pay | Admitting: General Surgery

## 2024-02-04 DIAGNOSIS — Z01812 Encounter for preprocedural laboratory examination: Secondary | ICD-10-CM

## 2024-02-04 DIAGNOSIS — I10 Essential (primary) hypertension: Secondary | ICD-10-CM

## 2024-02-04 DIAGNOSIS — Z01810 Encounter for preprocedural cardiovascular examination: Secondary | ICD-10-CM

## 2024-02-04 DIAGNOSIS — Z853 Personal history of malignant neoplasm of breast: Secondary | ICD-10-CM

## 2024-02-04 HISTORY — DX: Prediabetes: R73.03

## 2024-02-04 NOTE — Patient Instructions (Addendum)
 Your procedure is scheduled on: 02/07/24  Report to the Registration Desk on the 1st floor of the [facility]. To find out your arrival time, please call [phone] between 1PM - 3PM on: 02/06/24 If your arrival time is 6:00 am, do not arrive before that time as the [facility] entrance doors do not open until 6:00 am.  REMEMBER: Instructions that are not followed completely may result in serious medical risk, up to and including death; or upon the discretion of your surgeon and anesthesiologist your surgery may need to be rescheduled.  Do not eat food after midnight the night before surgery.  No gum chewing or hard candies.  You may however, drink CLEAR liquids up to 2 hours before you are scheduled to arrive for your surgery. Do not drink anything within 2 hours of your scheduled arrival time.  Clear liquids include: - water  - apple juice without pulp - gatorade (not RED colors) - black coffee or tea (Do NOT add milk or creamers to the coffee or tea) Do NOT drink anything that is not on this list.   One week prior to surgery: Stop Anti-inflammatories (NSAIDS) such as Advil , Aleve , Ibuprofen , Motrin , Naproxen , Naprosyn  and Aspirin based products such as Excedrin, Goody's Powder, BC Powder. You may continue to take Tylenol  if needed for pain up until the day of surgery.  Stop ANY OVER THE COUNTER supplements until after surgery.  ON THE DAY OF SURGERY ONLY TAKE THESE MEDICATIONS WITH SIPS OF WATER:   levothyroxine  (SYNTHROID )  metoprolol  (LOPRESSOR )  omeprazole (PRILOSEC)   Use inhalers on the day of surgery and bring to the hospital.   No Alcohol for 24 hours before or after surgery.  No Smoking including e-cigarettes for 24 hours before surgery.  No chewable tobacco products for at least 6 hours before surgery.  No nicotine patches on the day of surgery.  Do not use any recreational drugs for at least a week (preferably 2 weeks) before your surgery.  Please be  advised that the combination of cocaine and anesthesia may have negative outcomes, up to and including death. If you test positive for cocaine, your surgery will be cancelled.  On the morning of surgery brush your teeth with toothpaste and water, you may rinse your mouth with mouthwash if you wish. Do not swallow any toothpaste or mouthwash.  Use CHG Soap or wipes as directed on instruction sheet.  Do not wear jewelry, make-up, hairpins, clips or nail polish.  For welded (permanent) jewelry: bracelets, anklets, waist bands, etc.  Please have this removed prior to surgery.  If it is not removed, there is a chance that hospital personnel will need to cut it off on the day of surgery.  Do not wear lotions, powders, or perfumes.   Do not shave body hair from the neck down 48 hours before surgery.  Contact lenses, hearing aids and dentures may not be worn into surgery.  Do not bring valuables to the hospital. [facility] is not responsible for any missing/lost belongings or valuables.   Bring your C-PAP to the hospital in case you may have to spend the night.   Notify your doctor if there is any change in your medical condition (cold, fever, infection).  Wear comfortable clothing (specific to your surgery type) to the hospital.  After surgery, you can help prevent lung complications by doing breathing exercises.  Take deep breaths and cough every 1-2 hours. Your doctor may order a device called an Incentive  Spirometer to help you take deep breaths.  When coughing or sneezing, hold a pillow firmly against your incision with both hands. This is called splinting. Doing this helps protect your incision. It also decreases belly discomfort.  If you are being admitted to the hospital overnight, leave your suitcase in the car. After surgery it may be brought to your room.  In case of increased patient census, it may be necessary for you, the patient, to continue your postoperative care in the  Same Day Surgery department.  If you are being discharged the day of surgery, you will not be allowed to drive home. You will need a responsible individual to drive you home and stay with you for 24 hours after surgery.   If you are taking public transportation, you will need to have a responsible individual with you.  Please call the Pre-admissions Testing Dept. at [phone] if you have any questions about these instructions.  Surgery Visitation Policy:  Patients having surgery or a procedure may have two visitors.  Children under the age of 74 must have an adult with them who is not the patient.  Inpatient Visitation:    Visiting hours are 7 a.m. to 8 p.m. Up to four visitors are allowed at one time in a patient room. The visitors may rotate out with other people during the day.  One visitor age 38 or older may stay with the patient overnight and must be in the room by 8 p.m.   Merchandiser, Retail to address health-related social needs:  [email or web address]                                                                                                            Preparing for Surgery with CHLORHEXIDINE  GLUCONATE (CHG) Soap  Chlorhexidine  Gluconate (CHG) Soap  o An antiseptic cleaner that kills germs and bonds with the skin to continue killing germs even after washing  o Used for showering the night before surgery and morning of surgery  Before surgery, you can play an important role by reducing the number of germs on your skin.  CHG (Chlorhexidine  gluconate) soap is an antiseptic cleanser which kills germs and bonds with the skin to continue killing germs even after washing.  Please do not use if you have an allergy to CHG or antibacterial soaps. If your skin becomes reddened/irritated stop using the CHG.  1. Shower the NIGHT BEFORE SURGERY with CHG soap.  2. If you choose to wash your hair, wash your hair first as usual with your normal  shampoo.  3. After shampooing, rinse your hair and body thoroughly to remove the shampoo.  4. Use CHG as you would any other liquid soap. You can apply CHG directly to the skin and wash gently with a clean washcloth.  5. Apply the CHG soap to your body only from the neck down. Do not use on open wounds or open sores. Avoid contact with your eyes, ears, mouth, and genitals (private parts). Wash face and genitals (private parts) with your normal soap.  6. Wash thoroughly, paying special attention to the area where your surgery will be performed.  7. Thoroughly rinse your body with warm water.  8. Do not shower/wash with your normal soap after using and rinsing off the CHG soap.  9. Do not use lotions, oils, etc., after showering with CHG.  10. Pat yourself dry with a clean towel.  11. Wear clean pajamas to bed the night before surgery.  12. Place clean sheets on your bed the night of your shower and do not sleep with pets.  13. Do not apply any deodorants/lotions/powders.  14. Please wear clean clothes to the hospital.  15. Remember to brush your teeth with your regular toothpaste.

## 2024-02-05 ENCOUNTER — Encounter
Admission: RE | Admit: 2024-02-05 | Discharge: 2024-02-05 | Disposition: A | Discharge status: Home / Self Care | Source: Ambulatory Visit | Attending: General Surgery | Admitting: General Surgery

## 2024-02-05 DIAGNOSIS — Z01818 Encounter for other preprocedural examination: Secondary | ICD-10-CM | POA: Insufficient documentation

## 2024-02-05 DIAGNOSIS — I10 Essential (primary) hypertension: Secondary | ICD-10-CM | POA: Insufficient documentation

## 2024-02-05 DIAGNOSIS — Z01812 Encounter for preprocedural laboratory examination: Secondary | ICD-10-CM

## 2024-02-05 DIAGNOSIS — Z01810 Encounter for preprocedural cardiovascular examination: Secondary | ICD-10-CM

## 2024-02-05 LAB — BASIC METABOLIC PANEL WITH GFR
Anion gap: 13 (ref 5–15)
BUN: 9 mg/dL (ref 8–23)
CO2: 25 mmol/L (ref 22–32)
Calcium: 9.5 mg/dL (ref 8.9–10.3)
Chloride: 98 mmol/L (ref 98–111)
Creatinine, Ser: 0.79 mg/dL (ref 0.44–1.00)
GFR, Estimated: >60 mL/min
Glucose, Bld: 122 mg/dL — ABNORMAL HIGH (ref 70–99)
Potassium: 3.8 mmol/L (ref 3.5–5.1)
Sodium: 136 mmol/L (ref 135–145)

## 2024-02-05 LAB — CBC
HCT: 40.2 % (ref 36.0–46.0)
Hemoglobin: 13.9 g/dL (ref 12.0–15.0)
MCH: 28.7 pg (ref 26.0–34.0)
MCHC: 34.6 g/dL (ref 30.0–36.0)
MCV: 83.1 fL (ref 80.0–100.0)
Platelets: 388 10*3/uL (ref 150–400)
RBC: 4.84 MIL/uL (ref 3.87–5.11)
RDW: 12.5 % (ref 11.5–15.5)
WBC: 5 10*3/uL (ref 4.0–10.5)
nRBC: 0 % (ref 0.0–0.2)

## 2024-02-06 MED ORDER — CHLORHEXIDINE GLUCONATE 0.12 % MT SOLN
15.0000 mL | Freq: Once | OROMUCOSAL | Status: AC
  Administered 2024-02-07: 15 mL via OROMUCOSAL

## 2024-02-06 MED ORDER — CHLORHEXIDINE GLUCONATE CLOTH 2 % EX PADS: 6.0000 | MEDICATED_PAD | Freq: Once | CUTANEOUS | Status: DC

## 2024-02-06 MED ORDER — ORAL CARE MOUTH RINSE: 15.0000 mL | Freq: Once | OROMUCOSAL | Status: AC

## 2024-02-06 MED ORDER — CEFAZOLIN SODIUM-DEXTROSE 2-4 GM/100ML-% IV SOLN
2.0000 g | INTRAVENOUS | Status: AC
  Administered 2024-02-07: 2 g via INTRAVENOUS

## 2024-02-06 MED ORDER — LACTATED RINGERS IV SOLN: INTRAVENOUS | Status: DC

## 2024-02-07 ENCOUNTER — Ambulatory Visit: Payer: Self-pay | Admitting: Urgent Care

## 2024-02-07 ENCOUNTER — Ambulatory Visit

## 2024-02-07 ENCOUNTER — Encounter
Admission: RE | Disposition: A | Discharge status: Home / Self Care | Payer: Self-pay | Source: Home / Self Care | Attending: General Surgery

## 2024-02-07 ENCOUNTER — Ambulatory Visit
Admission: RE | Admit: 2024-02-07 | Discharge: 2024-02-07 | Disposition: A | Discharge status: Home / Self Care | Attending: General Surgery | Admitting: General Surgery

## 2024-02-07 ENCOUNTER — Encounter: Payer: Self-pay | Admitting: General Surgery

## 2024-02-07 ENCOUNTER — Other Ambulatory Visit: Payer: Self-pay

## 2024-02-07 DIAGNOSIS — F319 Bipolar disorder, unspecified: Secondary | ICD-10-CM | POA: Insufficient documentation

## 2024-02-07 DIAGNOSIS — C50412 Malignant neoplasm of upper-outer quadrant of left female breast: Secondary | ICD-10-CM | POA: Diagnosis not present

## 2024-02-07 DIAGNOSIS — K449 Diaphragmatic hernia without obstruction or gangrene: Secondary | ICD-10-CM | POA: Insufficient documentation

## 2024-02-07 DIAGNOSIS — K219 Gastro-esophageal reflux disease without esophagitis: Secondary | ICD-10-CM | POA: Insufficient documentation

## 2024-02-07 DIAGNOSIS — E119 Type 2 diabetes mellitus without complications: Secondary | ICD-10-CM | POA: Insufficient documentation

## 2024-02-07 DIAGNOSIS — E039 Hypothyroidism, unspecified: Secondary | ICD-10-CM | POA: Diagnosis not present

## 2024-02-07 DIAGNOSIS — G4730 Sleep apnea, unspecified: Secondary | ICD-10-CM | POA: Insufficient documentation

## 2024-02-07 DIAGNOSIS — I10 Essential (primary) hypertension: Secondary | ICD-10-CM | POA: Diagnosis not present

## 2024-02-07 DIAGNOSIS — N644 Mastodynia: Secondary | ICD-10-CM | POA: Insufficient documentation

## 2024-02-07 DIAGNOSIS — Z87891 Personal history of nicotine dependence: Secondary | ICD-10-CM | POA: Insufficient documentation

## 2024-02-07 DIAGNOSIS — Z9013 Acquired absence of bilateral breasts and nipples: Secondary | ICD-10-CM | POA: Diagnosis not present

## 2024-02-07 DIAGNOSIS — F419 Anxiety disorder, unspecified: Secondary | ICD-10-CM | POA: Insufficient documentation

## 2024-02-07 DIAGNOSIS — Z853 Personal history of malignant neoplasm of breast: Secondary | ICD-10-CM

## 2024-02-07 DIAGNOSIS — D0512 Intraductal carcinoma in situ of left breast: Secondary | ICD-10-CM | POA: Insufficient documentation

## 2024-02-07 DIAGNOSIS — Z170 Estrogen receptor positive status [ER+]: Secondary | ICD-10-CM

## 2024-02-07 MED ORDER — PHENYLEPHRINE HCL (PRESSORS) 10 MG/ML IV SOLN
INTRAVENOUS | Status: DC | PRN
  Administered 2024-02-07: 80 ug via INTRAVENOUS

## 2024-02-07 MED ORDER — ONDANSETRON HCL 4 MG/2ML IJ SOLN
INTRAMUSCULAR | Status: DC | PRN
  Administered 2024-02-07: 4 mg via INTRAVENOUS

## 2024-02-07 MED ORDER — ONDANSETRON HCL 4 MG/2ML IJ SOLN
INTRAMUSCULAR | Status: AC
  Filled 2024-02-07: qty 2

## 2024-02-07 MED ORDER — FENTANYL CITRATE (PF) 100 MCG/2ML IJ SOLN
INTRAMUSCULAR | Status: DC | PRN
  Administered 2024-02-07: 50 ug via INTRAVENOUS

## 2024-02-07 MED ORDER — DEXMEDETOMIDINE HCL IN NACL 80 MCG/20ML IV SOLN
INTRAVENOUS | Status: DC | PRN
  Administered 2024-02-07: 8 ug via INTRAVENOUS

## 2024-02-07 MED ORDER — VASOPRESSIN 20 UNIT/ML IV SOLN
INTRAVENOUS | Status: AC
  Filled 2024-02-07: qty 1

## 2024-02-07 MED ORDER — DEXAMETHASONE SOD PHOSPHATE PF 10 MG/ML IJ SOLN
INTRAMUSCULAR | Status: AC
  Filled 2024-02-07: qty 1

## 2024-02-07 MED ORDER — STERILE WATER FOR IRRIGATION IR SOLN
Status: DC | PRN
  Administered 2024-02-07: 50 mL

## 2024-02-07 MED ORDER — CEFAZOLIN SODIUM-DEXTROSE 2-4 GM/100ML-% IV SOLN
INTRAVENOUS | Status: AC
  Filled 2024-02-07: qty 100

## 2024-02-07 MED ORDER — GLYCOPYRROLATE 0.2 MG/ML IJ SOLN
INTRAMUSCULAR | Status: DC | PRN
  Administered 2024-02-07: .2 mg via INTRAVENOUS

## 2024-02-07 MED ORDER — BUPIVACAINE-EPINEPHRINE (PF) 0.5% -1:200000 IJ SOLN
INTRAMUSCULAR | Status: DC | PRN
  Administered 2024-02-07: 20 mL

## 2024-02-07 MED ORDER — BUPIVACAINE-EPINEPHRINE (PF) 0.5% -1:200000 IJ SOLN
INTRAMUSCULAR | Status: AC
  Filled 2024-02-07: qty 30

## 2024-02-07 MED ORDER — DROPERIDOL 2.5 MG/ML IJ SOLN: 0.6250 mg | Freq: Once | INTRAMUSCULAR | Status: DC | PRN

## 2024-02-07 MED ORDER — VASOPRESSIN 20 UNIT/ML IV SOLN
INTRAVENOUS | Status: DC | PRN
  Administered 2024-02-07 (×4): 1 [IU] via INTRAVENOUS

## 2024-02-07 MED ORDER — EPHEDRINE SULFATE-NACL 50-0.9 MG/10ML-% IV SOSY
PREFILLED_SYRINGE | INTRAVENOUS | Status: DC | PRN
  Administered 2024-02-07 (×3): 5 mg via INTRAVENOUS
  Administered 2024-02-07: 10 mg via INTRAVENOUS

## 2024-02-07 MED ORDER — OXYCODONE HCL 5 MG/5ML PO SOLN: 5.0000 mg | Freq: Once | ORAL | Status: AC | PRN

## 2024-02-07 MED ORDER — LIDOCAINE HCL (CARDIAC) PF 100 MG/5ML IV SOSY
PREFILLED_SYRINGE | INTRAVENOUS | Status: DC | PRN
  Administered 2024-02-07: 100 mg via INTRAVENOUS

## 2024-02-07 MED ORDER — OXYCODONE HCL 5 MG PO TABS
ORAL_TABLET | ORAL | Status: AC
  Filled 2024-02-07: qty 1

## 2024-02-07 MED ORDER — LACTATED RINGERS IV SOLN: INTRAVENOUS | Status: DC | PRN

## 2024-02-07 MED ORDER — FENTANYL CITRATE (PF) 100 MCG/2ML IJ SOLN: 25.0000 ug | INTRAMUSCULAR | Status: DC | PRN

## 2024-02-07 MED ORDER — OXYCODONE HCL 5 MG PO TABS
5.0000 mg | ORAL_TABLET | Freq: Once | ORAL | Status: AC | PRN
  Administered 2024-02-07: 5 mg via ORAL

## 2024-02-07 MED ORDER — PROPOFOL 10 MG/ML IV BOLUS
INTRAVENOUS | Status: AC
  Filled 2024-02-07: qty 20

## 2024-02-07 MED ORDER — DEXAMETHASONE SOD PHOSPHATE PF 10 MG/ML IJ SOLN
INTRAMUSCULAR | Status: DC | PRN
  Administered 2024-02-07: 10 mg via INTRAVENOUS

## 2024-02-07 MED ORDER — FENTANYL CITRATE (PF) 100 MCG/2ML IJ SOLN
INTRAMUSCULAR | Status: AC
  Filled 2024-02-07: qty 2

## 2024-02-07 MED ORDER — MIDAZOLAM HCL 2 MG/2ML IJ SOLN
INTRAMUSCULAR | Status: AC
  Filled 2024-02-07: qty 2

## 2024-02-07 MED ORDER — MIDAZOLAM HCL (PF) 2 MG/2ML IJ SOLN
INTRAMUSCULAR | Status: DC | PRN
  Administered 2024-02-07: 2 mg via INTRAVENOUS

## 2024-02-07 MED ORDER — CHLORHEXIDINE GLUCONATE 0.12 % MT SOLN
OROMUCOSAL | Status: AC
  Filled 2024-02-07: qty 15

## 2024-02-07 MED ORDER — OXYCODONE HCL 5 MG PO TABS: 5.0000 mg | ORAL_TABLET | Freq: Four times a day (QID) | ORAL | 0 refills | Status: AC | PRN

## 2024-02-07 MED ORDER — PROPOFOL 10 MG/ML IV BOLUS
INTRAVENOUS | Status: DC | PRN
  Administered 2024-02-07: 200 mg via INTRAVENOUS

## 2024-02-07 MED ORDER — LIDOCAINE HCL (PF) 2 % IJ SOLN
INTRAMUSCULAR | Status: AC
  Filled 2024-02-07: qty 5

## 2024-02-07 MED ORDER — EPHEDRINE 5 MG/ML INJ
INTRAVENOUS | Status: AC
  Filled 2024-02-07: qty 5

## 2024-02-07 NOTE — H&P (Signed)
 No updates on H and P, proceed as planned below  CC: Recurrence of Left Breast Cancer History of Present Illness Kelli Logan is a 64 y.o. female with past medical history significant for below who presents to consultation for local recurrence of left breast cancer.  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  She underwent a sentinel lymph node biopsy at this time and the nodes were negative for cancer.  She did not tolerate hormonal therapy and only took 1 month of it.  She says that few months ago she noticed the pain in her left upper breast.  She says that when she palpated it she felt a mass.  She presented for consultation of this.  She had imaging that was concerning for cancer.  She had a biopsy of this and this was again invasive ductal carcinoma.  It was the same hormonal receptor status as her previous cancer.  She subsequently underwent a PET scan and an MRI.  This showed that she had an intracapsular rupture of her implant but no signs of metastatic disease.SABRA   Past Medical History     Past Medical History:  Diagnosis Date   Anxiety     Arthritis     Cancer (HCC)      breast left   Depression     Fibromyalgia     GERD (gastroesophageal reflux disease)     Headache     History of kidney stones      h/o   Hypertension     Hypothyroidism     Pre-diabetes     Sleep apnea      DOES  USE CPAP   Thyroid disease                   Past Surgical History:  Procedure Laterality Date   APPENDECTOMY       BREAST BIOPSY Left few yrs ago    stereotactic bx in [place], benign   BREAST BIOPSY Left 12/26/2017    Affirm Bx [facility]- X-Clip   BREAST BIOPSY Left 01/14/2018    positive- pt had mastectomy   BREAST BIOPSY Left 01/08/2024    US  LT BREAST BX W LOC DEV 1ST LESION IMG BX SPEC US  GUIDE 01/08/2024  [facility]   BREAST IMPLANT EXCHANGE Right 02/16/2021    Procedure: PLACEMENT OF BREAST IMPLANT;  Surgeon: Lowery Estefana RAMAN, DO;  Location: [facility];  Service: Plastics;  Laterality: Right;  1.5 hour   BREAST RECONSTRUCTION WITH PLACEMENT OF TISSUE EXPANDER AND ALLODERM Left 04/08/2018    Procedure: LEFT BREAST IMMEDIATE  RECONSTRUCTION WITH EXPANDER AND FLEX HD;  Surgeon: Lowery Estefana RAMAN, DO;  Location: [facility];  Service: Plastics;  Laterality: Left;   COLONOSCOPY WITH PROPOFOL  N/A 06/26/2018    Procedure: COLONOSCOPY WITH PROPOFOL ;  Surgeon: Janalyn Keene NOVAK, MD;  Location: [facility];  Service: Endoscopy;  Laterality: N/A;   DRUG INDUCED ENDOSCOPY Right 02/27/2022    Procedure: DRUG INDUCED ENDOSCOPY;  Surgeon: Carlie Clark, MD;  Location: [facility];  Service: ENT;  Laterality: Right;   ESOPHAGOGASTRODUODENOSCOPY (EGD) WITH PROPOFOL  N/A 06/26/2018    Procedure: ESOPHAGOGASTRODUODENOSCOPY (EGD) WITH PROPOFOL ;  Surgeon: Janalyn Keene NOVAK, MD;  Location: [facility];  Service: Endoscopy;  Laterality: N/A;   EYE SURGERY Bilateral      lasik  FOOT BONE EXCISION       IMAGE GUIDED SINUS SURGERY   10/220   LIPOSUCTION Bilateral 02/16/2021    Procedure: LIPOSUCTION OF BILATERAL BREAST;  Surgeon: Lowery Estefana RAMAN, DO;  Location: [facility];  Service: Plastics;  Laterality: Bilateral;   LIPOSUCTION WITH LIPOFILLING Left 04/30/2019    Procedure: Fat grafting to left breast;  Surgeon: Lowery Estefana RAMAN, DO;  Location: [facility];  Service: Plastics;  Laterality: Left;  90 min, please   MASTECTOMY Left 04/08/2019   MASTECTOMY W/ SENTINEL NODE BIOPSY Left 04/08/2018    Procedure: MASTECTOMY WITH SENTINEL LYMPH NODE BIOPSY LEFT;  Surgeon: Marolyn Nest, MD;  Location: [facility];  Service: General;  Laterality: Left;   MASTOPEXY Right 08/04/2018    Procedure: MASTOPEXY;  Surgeon: Lowery Estefana RAMAN, DO;   Location: [facility];  Service: Plastics;  Laterality: Right;  TOTAL CASE TIME SHOULD BE 3 HOURS, PLEASE   REMOVAL OF TISSUE EXPANDER AND PLACEMENT OF IMPLANT Left 08/04/2018    Procedure: REMOVAL OF TISSUE EXPANDER AND PLACEMENT OF IMPLANT;  Surgeon: Lowery Estefana RAMAN, DO;  Location: [facility];  Service: Plastics;  Laterality: Left;   SCAR REVISION Left 04/30/2019    Procedure: release of scar contracture to left breast;  Surgeon: Lowery Estefana RAMAN, DO;  Location: [facility];  Service: Plastics;  Laterality: Left;   TOTAL HIP ARTHROPLASTY Left 02/11/2020    Procedure: TOTAL HIP ARTHROPLASTY ANTERIOR APPROACH;  Surgeon: Kathlynn Sharper, MD;  Location: [facility];  Service: Orthopedics;  Laterality: Left;   UMBILICAL HERNIA REPAIR   1983 and 1985          [Allergies]  [Allergies]      Allergen Reactions   Tape Other (See Comments)      Steri strips after breast surgery/Burned skin   Tegaderm and other tape OK     No current facility-administered medications for this visit.      No current outpatient medications on file.             Facility-Administered Medications Ordered in Other Visits  Medication Dose Route Frequency Provider Last Rate Last Admin   ceFAZolin  (ANCEF ) IVPB 2g/100 mL premix  2 g Intravenous On Call to OR Marinda Jayson KIDD, MD       Chlorhexidine  Gluconate Cloth 2 % PADS 6 each  6 each Topical Once Marinda Jayson KIDD, MD        And   Chlorhexidine  Gluconate Cloth 2 % PADS 6 each  6 each Topical Once Marinda Jayson KIDD, MD       lactated ringers  infusion   Intravenous Continuous Vicci Camellia Glatter, MD 10 mL/hr at 02/07/24 1012 New Bag at 02/07/24 1012        Family History      Family History  Problem Relation Age of Onset   Hypertension Mother     Lung cancer Maternal Grandmother     Heart disease Father     Alcohol abuse Father     Breast cancer Neg Hx     Colon cancer Neg Hx              Social History [Social History]   [Social  History]      Tobacco Use   Smoking status: Former      Current packs/day: 0.00      Average packs/day: 1 pack/day for 2.0 years (2.0 ttl pk-yrs)      Types: Cigarettes  Start date: 01/07/2015      Quit date: 01/06/2017      Years since quitting: 7.0   Smokeless tobacco: Never  Vaping Use   Vaping status: Former   Quit date: 11/27/2016  Substance Use Topics   Alcohol use: Yes      Comment: rare beer    Drug use: Not Currently   Denies smoking     ROS Full ROS of systems performed and is otherwise negative there than what is stated in the HPI   Physical Exam Blood pressure (!) 161/99, pulse 72, temperature 98.4 F (36.9 C), temperature source Oral, height 5' 5.25 (1.657 m), weight 217 lb 3.2 oz (98.5 kg), last menstrual period 02/05/2016, SpO2 98%.   Alert and oriented x 3, normal work of breathing on room air, regular rate and rhythm, abdomen is soft, nontender nondistended breast exam performed the chaperone.  She has well-healed incisions over her breast.  In the upper inner left chest there is a nonmobile hard mass that is painful to palpation.  There is no overlying skin changes to this. Data Reviewed I have reviewed her PET scan as well as her MRI.  She has a mass in her left upper chest that does not invade the pec but does abut it.  This biopsy is consistent with invasive ductal carcinoma.   I have personally reviewed the patient's imaging and medical records.     Assessment Assessment Patient with local recurrence of breast cancer.   Plan Plan I had a long discussion with the patient about the treatment options.  I recommended that we undergo a wide local excision of this.  Given that she has already had nodal sampling and that this will not map given her mastectomy I do not think we need to perform any sentinel lymph node biopsy.  I have discussed this case with a breast surgeon and she agrees that they would proceed with wide local excision we will plan for  routine surveillance of her axilla.  I discussed with her that it would likely be recommended that she undergo postoperative radiation.  I would also like for her to attempt another course of hormonal therapy.  I discussed the risk, benefits and alternatives to the procedure including risk of infection, bleeding, poor cosmesis, wound breakdown as well as positive margins.  She understands these risks and wishes to proceed with surgery.     A total of 65 minutes was spent reviewing the patient's chart, performing history and physical and discussing treatment options with the patient     Jayson MALVA Endow

## 2024-02-07 NOTE — Transfer of Care (Signed)
 Immediate Anesthesia Transfer of Care Note  Patient: Kelli Logan  Procedure(s) Performed: BREAST LUMPECTOMY (Left)  Patient Location: PACU  Anesthesia Type:General  Level of Consciousness: drowsy  Airway & Oxygen Therapy: Patient Spontanous Breathing and Patient connected to face mask oxygen  Post-op Assessment: Report given to RN and Post -op Vital signs reviewed and stable  Post vital signs: Reviewed and stable  Last Vitals:  Vitals Value Taken Time  BP 108/54 02/07/24 11:46  Temp    Pulse 70 02/07/24 11:51  Resp    SpO2 94 % 02/07/24 11:51  Vitals shown include unfiled device data.  Last Pain:  Vitals:   02/07/24 0946  TempSrc: Tympanic  PainSc: 6          Complications: No notable events documented.

## 2024-02-07 NOTE — Anesthesia Preprocedure Evaluation (Signed)
"                                    Anesthesia Evaluation  Patient identified by MRN, date of birth, ID band Patient awake    Reviewed: Allergy & Precautions, H&P , NPO status , Patient's Chart, lab work & pertinent test results, reviewed documented beta blocker date and time   History of Anesthesia Complications Negative for: history of anesthetic complications  Airway Mallampati: II  TM Distance: >3 FB Neck ROM: full    Dental  (+) Dental Advidsory Given, Caps, Teeth Intact   Pulmonary neg shortness of breath, sleep apnea and Continuous Positive Airway Pressure Ventilation , neg COPD, neg recent URI, former smoker   Pulmonary exam normal breath sounds clear to auscultation       Cardiovascular Exercise Tolerance: Good hypertension, (-) angina (-) Past MI and (-) Cardiac Stents Normal cardiovascular exam(-) dysrhythmias (-) Valvular Problems/Murmurs Rhythm:regular Rate:Normal     Neuro/Psych  Headaches, neg Seizures PSYCHIATRIC DISORDERS Anxiety Depression Bipolar Disorder    Neuromuscular disease    GI/Hepatic Neg liver ROS, hiatal hernia,GERD  ,,  Endo/Other  neg diabetesHypothyroidism    Renal/GU negative Renal ROS  negative genitourinary   Musculoskeletal   Abdominal   Peds  Hematology negative hematology ROS (+)   Anesthesia Other Findings Past Medical History: No date: Anxiety No date: Arthritis No date: Cancer [facility])     Comment:  breast left No date: Depression No date: Fibromyalgia No date: GERD (gastroesophageal reflux disease) No date: Headache No date: History of kidney stones     Comment:  h/o No date: Hypertension No date: Hypothyroidism No date: Pre-diabetes No date: Sleep apnea     Comment:  DOES  USE CPAP No date: Thyroid disease   Reproductive/Obstetrics negative OB ROS                              Anesthesia Physical Anesthesia Plan  ASA: 2  Anesthesia Plan: General   Post-op Pain  Management:    Induction: Intravenous  PONV Risk Score and Plan: 2 and Ondansetron , Dexamethasone , Midazolam  and Treatment may vary due to age or medical condition  Airway Management Planned: LMA  Additional Equipment:   Intra-op Plan:   Post-operative Plan: Extubation in OR  Informed Consent: I have reviewed the patients History and Physical, chart, labs and discussed the procedure including the risks, benefits and alternatives for the proposed anesthesia with the patient or authorized representative who has indicated his/her understanding and acceptance.     Dental Advisory Given  Plan Discussed with: Anesthesiologist, CRNA and Surgeon  Anesthesia Plan Comments:         Anesthesia Quick Evaluation  "

## 2024-02-07 NOTE — Op Note (Signed)
 Operative note  Preoperative diagnosis: Local recurrence of left breast cancer Postoperative diagnosis: Local recurrence of left breast cancer Surgeon: Jayson Endow MD EBL: 10ccs Procedure: Left Breast Lumpectomy  Indications: This is a 64 year old that was noted to have a left breast mass after undergoing double mastectomy.  She had a biopsy of this that was proven to be recurrence of her previous invasive ductal carcinoma.  A biopsy clip was placed and she agreed for wide local excision of the mass.  After informed consent was obtained the patient was brought to the operating room and placed supine on the operating room table.  General endotracheal anesthesia was then induced.  Her left breast was then prepped and draped in the usual sterile fashion.  A surgical timeout was called identifying correct patient, site, side and procedure.  The left breast mass was palpated and an elliptical incision over this mass was marked out.  The skin was then infiltrated with 20 cc of half percent Marcaine  with epinephrine .  An incision was made along the ellipse.  This was taken down through the subcutaneous tissue into the breast parenchyma.  This was taken down to the chest wall.  Some of the pectoralis major fibers were included in the resection to provide an adequate posterior margin.  The mass was again palpated and and dissected off of the pectoralis major muscle. I ensured that we had adequate margins on all sides.  Some of the muscle fibers again were taken.  I came across the posterior margin and the mass was excised fully.  It was appropriately marked with a single short superior stitch and a double long lateral stitch.  It was then x-rayed and the biopsy clip was not included in the specimen.  The skin was then closed in a deep dermal layer with 3-0 Vicryl.  The skin was closed with 4-0 Monocryl and dressed with glue.  The patient tolerated the procedure well.  Prior to termination of the procedure all  sponge and instrument counts were correct x 2.

## 2024-02-07 NOTE — Anesthesia Procedure Notes (Signed)
 Procedure Name: LMA Insertion Date/Time: 02/07/2024 10:44 AM  Performed by: Dorcus Juliene SAILOR, CRNAPre-anesthesia Checklist: Patient identified, Emergency Drugs available, Suction available and Patient being monitored Patient Re-evaluated:Patient Re-evaluated prior to induction Oxygen Delivery Method: Circle system utilized Preoxygenation: Pre-oxygenation with 100% oxygen Induction Type: IV induction Ventilation: Mask ventilation without difficulty LMA: LMA inserted LMA Size: 4.0 Number of attempts: 1 Dental Injury: Teeth and Oropharynx as per pre-operative assessment

## 2024-02-07 NOTE — Progress Notes (Signed)
 Patient ID: Kelli Logan, female   DOB: 10/03/1960, 64 y.o.   MRN: [id number] CC: Recurrence of Left Breast Cancer History of Present Illness Kelli Logan is a 64 y.o. female with past medical history significant for below who presents to consultation for local recurrence of left breast cancer.  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  She underwent a sentinel lymph node biopsy at this time and the nodes were negative for cancer.  She did not tolerate hormonal therapy and only took 1 month of it.  She says that few months ago she noticed the pain in her left upper breast.  She says that when she palpated it she felt a mass.  She presented for consultation of this.  She had imaging that was concerning for cancer.  She had a biopsy of this and this was again invasive ductal carcinoma.  It was the same hormonal receptor status as her previous cancer.  She subsequently underwent a PET scan and an MRI.  This showed that she had an intracapsular rupture of her implant but no signs of metastatic disease.Kelli Logan  Past Medical History Past Medical History:  Diagnosis Date   Anxiety    Arthritis    Cancer (HCC)    breast left   Depression    Fibromyalgia    GERD (gastroesophageal reflux disease)    Headache    History of kidney stones    h/o   Hypertension    Hypothyroidism    Pre-diabetes    Sleep apnea    DOES  USE CPAP   Thyroid disease        Past Surgical History:  Procedure Laterality Date   APPENDECTOMY     BREAST BIOPSY Left few yrs ago   stereotactic bx in [place], benign   BREAST BIOPSY Left 12/26/2017   Affirm Bx [facility]- X-Clip   BREAST BIOPSY Left 01/14/2018   positive- pt had mastectomy   BREAST BIOPSY Left 01/08/2024   US  LT BREAST BX W LOC DEV 1ST LESION IMG BX SPEC US  GUIDE 01/08/2024 [facility]    BREAST IMPLANT EXCHANGE Right 02/16/2021   Procedure: PLACEMENT OF BREAST IMPLANT;  Surgeon: Lowery Estefana RAMAN, DO;  Location: [facility];  Service: Plastics;  Laterality: Right;  1.5 hour   BREAST RECONSTRUCTION WITH PLACEMENT OF TISSUE EXPANDER AND ALLODERM Left 04/08/2018   Procedure: LEFT BREAST IMMEDIATE  RECONSTRUCTION WITH EXPANDER AND FLEX HD;  Surgeon: Lowery Estefana RAMAN, DO;  Location: [facility];  Service: Plastics;  Laterality: Left;   COLONOSCOPY WITH PROPOFOL  N/A 06/26/2018   Procedure: COLONOSCOPY WITH PROPOFOL ;  Surgeon: Janalyn Keene NOVAK, MD;  Location: [facility];  Service: Endoscopy;  Laterality: N/A;   DRUG INDUCED ENDOSCOPY Right 02/27/2022   Procedure: DRUG INDUCED ENDOSCOPY;  Surgeon: Carlie Clark, MD;  Location: [facility];  Service: ENT;  Laterality: Right;   ESOPHAGOGASTRODUODENOSCOPY (EGD) WITH PROPOFOL  N/A 06/26/2018   Procedure: ESOPHAGOGASTRODUODENOSCOPY (EGD) WITH PROPOFOL ;  Surgeon: Janalyn Keene NOVAK, MD;  Location: [facility];  Service: Endoscopy;  Laterality: N/A;   EYE SURGERY Bilateral    lasik   FOOT BONE EXCISION     IMAGE GUIDED SINUS SURGERY  10/220   LIPOSUCTION Bilateral 02/16/2021   Procedure: LIPOSUCTION OF BILATERAL BREAST;  Surgeon: Lowery Estefana RAMAN, DO;  Location: [facility];  Service: Government Social Research Officer;  Laterality: Bilateral;   LIPOSUCTION WITH LIPOFILLING Left 04/30/2019   Procedure: Fat grafting to left breast;  Surgeon: Lowery Estefana RAMAN, DO;  Location: [facility];  Service: Plastics;  Laterality: Left;  90 min, please   MASTECTOMY Left 04/08/2019   MASTECTOMY W/ SENTINEL NODE BIOPSY Left 04/08/2018   Procedure: MASTECTOMY WITH SENTINEL LYMPH NODE BIOPSY LEFT;  Surgeon: Marolyn Nest, MD;  Location: [facility];  Service: General;  Laterality: Left;   MASTOPEXY Right 08/04/2018   Procedure: MASTOPEXY;  Surgeon: Lowery Estefana RAMAN, DO;  Location: [facility];  Service:  Plastics;  Laterality: Right;  TOTAL CASE TIME SHOULD BE 3 HOURS, PLEASE   REMOVAL OF TISSUE EXPANDER AND PLACEMENT OF IMPLANT Left 08/04/2018   Procedure: REMOVAL OF TISSUE EXPANDER AND PLACEMENT OF IMPLANT;  Surgeon: Lowery Estefana RAMAN, DO;  Location: [facility];  Service: Plastics;  Laterality: Left;   SCAR REVISION Left 04/30/2019   Procedure: release of scar contracture to left breast;  Surgeon: Lowery Estefana RAMAN, DO;  Location: [facility];  Service: Plastics;  Laterality: Left;   TOTAL HIP ARTHROPLASTY Left 02/11/2020   Procedure: TOTAL HIP ARTHROPLASTY ANTERIOR APPROACH;  Surgeon: Kathlynn Sharper, MD;  Location: [facility];  Service: Orthopedics;  Laterality: Left;   UMBILICAL HERNIA REPAIR  1983 and 1985    Allergies[1]  No current facility-administered medications for this visit.   No current outpatient medications on file.   Facility-Administered Medications Ordered in Other Visits  Medication Dose Route Frequency Provider Last Rate Last Admin   ceFAZolin  (ANCEF ) IVPB 2g/100 mL premix  2 g Intravenous On Call to OR Marinda Jayson KIDD, MD       Chlorhexidine  Gluconate Cloth 2 % PADS 6 each  6 each Topical Once Marinda Jayson KIDD, MD       And   Chlorhexidine  Gluconate Cloth 2 % PADS 6 each  6 each Topical Once Marinda Jayson KIDD, MD       lactated ringers  infusion   Intravenous Continuous Vicci Camellia Glatter, MD 10 mL/hr at 02/07/24 1012 New Bag at 02/07/24 1012    Family History Family History  Problem Relation Age of Onset   Hypertension Mother    Lung cancer Maternal Grandmother    Heart disease Father    Alcohol abuse Father    Breast cancer Neg Hx    Colon cancer Neg Hx        Social History Social History[2]  Denies smoking   ROS Full ROS of systems performed and is otherwise negative there than what is stated in the HPI  Physical Exam Blood pressure (!) 161/99, pulse 72, temperature 98.4 F (36.9 C), temperature source Oral, height 5' 5.25  (1.657 m), weight 217 lb 3.2 oz (98.5 kg), last menstrual period 02/05/2016, SpO2 98%.  Alert and oriented x 3, normal work of breathing on room air, regular rate and rhythm, abdomen is soft, nontender nondistended breast exam performed the chaperone.  She has well-healed incisions over her breast.  In the upper inner left chest there is a nonmobile hard mass that is painful to palpation.  There is no overlying skin changes to this. Data Reviewed I have reviewed her PET scan as well as her MRI.  She has a mass in her left upper chest that does not invade the pec but does abut it.  This biopsy is consistent with invasive ductal carcinoma.  I have personally reviewed the patient's imaging and medical records.  Assessment    Patient with local recurrence of breast cancer.  Plan    I had a long discussion with the patient about the treatment options.  I recommended that we undergo a wide local excision of this.  Given that she has already had nodal sampling and that this will not map given her mastectomy I do not think we need to perform any sentinel lymph node biopsy.  I have discussed this case with a breast surgeon and she agrees that they would proceed with wide local excision we will plan for routine surveillance of her axilla.  I discussed with her that it would likely be recommended that she undergo postoperative radiation.  I would also like for her to attempt another course of hormonal therapy.  I discussed the risk, benefits and alternatives to the procedure including risk of infection, bleeding, poor cosmesis, wound breakdown as well as positive margins.  She understands these risks and wishes to proceed with surgery.   A total of 65 minutes was spent reviewing the patient's chart, performing history and physical and discussing treatment options with the patient   Jayson MALVA Endow     [1]  Allergies Allergen Reactions   Tape Other (See Comments)    Steri strips after breast  surgery/Burned skin   Tegaderm and other tape OK  [2]  Social History Tobacco Use   Smoking status: Former    Current packs/day: 0.00    Average packs/day: 1 pack/day for 2.0 years (2.0 ttl pk-yrs)    Types: Cigarettes    Start date: 01/07/2015    Quit date: 01/06/2017    Years since quitting: 7.0   Smokeless tobacco: Never  Vaping Use   Vaping status: Former   Quit date: 11/27/2016  Substance Use Topics   Alcohol use: Yes    Comment: rare beer    Drug use: Not Currently

## 2024-02-10 ENCOUNTER — Encounter: Payer: Self-pay | Admitting: General Surgery

## 2024-02-10 NOTE — Anesthesia Postprocedure Evaluation (Signed)
"   Anesthesia Post Note  Patient: Kelli Logan  Procedure(s) Performed: BREAST LUMPECTOMY (Left)  Patient location during evaluation: PACU Anesthesia Type: General Level of consciousness: awake and alert Pain management: pain level controlled Vital Signs Assessment: post-procedure vital signs reviewed and stable Respiratory status: spontaneous breathing, nonlabored ventilation, respiratory function stable and patient connected to nasal cannula oxygen Cardiovascular status: blood pressure returned to baseline and stable Postop Assessment: no apparent nausea or vomiting Anesthetic complications: no   No notable events documented.   Last Vitals:  Vitals:   02/07/24 1300 02/07/24 1345  BP: (!) 155/85 (!) 141/85  Pulse: 66 70  Resp: 16 14  Temp: 36.6 C (!) 36.2 C  SpO2: 98% 97%    Last Pain:  Vitals:   02/07/24 1345  TempSrc:   PainSc: 0-No pain                 Prentice Murphy      "

## 2024-02-11 ENCOUNTER — Inpatient Hospital Stay: Admitting: Internal Medicine

## 2024-02-11 LAB — SURGICAL PATHOLOGY

## 2024-02-12 ENCOUNTER — Inpatient Hospital Stay

## 2024-02-12 DIAGNOSIS — C50412 Malignant neoplasm of upper-outer quadrant of left female breast: Secondary | ICD-10-CM

## 2024-02-12 NOTE — Progress Notes (Signed)
 Multidisciplinary Oncology Council Documentation  Leinaala Catanese Rainwater was presented by our [facility] on 02/12/2024, which included representatives from:  Palliative Care Dietitian  Physical/Occupational Therapist Nurse Navigator Genetics Social work Survivorship RN Financial Navigator Research RN   Deeandra currently presents with history of breast cancer  We reviewed previous medical and familial history, history of present illness, and recent lab results along with all available histopathologic and imaging studies. The MOC considered available treatment options and made the following recommendations/referrals:  SW, genetics   The MOC is a meeting of clinicians from various specialty areas who evaluate and discuss patients for whom a multidisciplinary approach is being considered. Final determinations in the plan of care are those of the provider(s).   Today's extended care, comprehensive team conference, Kerri-Anne was not present for the discussion and was not examined.

## 2024-02-13 ENCOUNTER — Telehealth: Payer: Self-pay

## 2024-02-13 NOTE — Telephone Encounter (Signed)
 [facility] Clinical Social Work  Clinical Social Work was referred by medical provider for assessment of psychosocial needs.  Clinical Social Worker attempted to contact patient by phone to offer support and assess for needs.  No Answer. CSW left vm with direct contact.   Lizbeth Sprague, LCSW  Clinical Social Worker [facility]

## 2024-02-14 ENCOUNTER — Telehealth: Payer: Self-pay

## 2024-02-14 NOTE — Telephone Encounter (Signed)
 [facility] Clinical Social Work  Clinical Social Work was referred by medical provider for assessment of psychosocial needs.  Clinical Social Worker attempted to contact patient by phone to offer support and assess for needs.  CSW left second voicemail for patient stating purpose of call and direct contact if needed. No additional Follow Up planned.    Lizbeth Sprague, LCSW  Clinical Social Worker [facility]

## 2024-02-19 ENCOUNTER — Inpatient Hospital Stay: Admitting: Internal Medicine

## 2024-02-20 ENCOUNTER — Encounter: Admitting: General Surgery

## 2024-02-25 ENCOUNTER — Inpatient Hospital Stay: Admitting: Licensed Clinical Social Worker

## 2024-02-25 ENCOUNTER — Inpatient Hospital Stay

## 2024-03-10 ENCOUNTER — Ambulatory Visit

## 2024-07-30 ENCOUNTER — Ambulatory Visit: Admitting: Psychiatry
